# Patient Record
Sex: Female | Born: 1950 | Race: White | Hispanic: No | Marital: Married | Smoking: Never smoker
Health system: Southern US, Community
[De-identification: ages and names within clinical notes are randomized; demographics above are authoritative.]

## PROBLEM LIST (undated history)

## (undated) DIAGNOSIS — I10 Essential (primary) hypertension: Secondary | ICD-10-CM

## (undated) DIAGNOSIS — Z923 Personal history of irradiation: Secondary | ICD-10-CM

## (undated) DIAGNOSIS — C801 Malignant (primary) neoplasm, unspecified: Secondary | ICD-10-CM

## (undated) DIAGNOSIS — E039 Hypothyroidism, unspecified: Secondary | ICD-10-CM

## (undated) DIAGNOSIS — C4490 Unspecified malignant neoplasm of skin, unspecified: Secondary | ICD-10-CM

## (undated) DIAGNOSIS — K5792 Diverticulitis of intestine, part unspecified, without perforation or abscess without bleeding: Secondary | ICD-10-CM

## (undated) DIAGNOSIS — C50919 Malignant neoplasm of unspecified site of unspecified female breast: Secondary | ICD-10-CM

## (undated) DIAGNOSIS — K219 Gastro-esophageal reflux disease without esophagitis: Secondary | ICD-10-CM

## (undated) DIAGNOSIS — Z9221 Personal history of antineoplastic chemotherapy: Secondary | ICD-10-CM

## (undated) DIAGNOSIS — T7840XA Allergy, unspecified, initial encounter: Secondary | ICD-10-CM

## (undated) HISTORY — DX: Diverticulitis of intestine, part unspecified, without perforation or abscess without bleeding: K57.92

## (undated) HISTORY — DX: Allergy, unspecified, initial encounter: T78.40XA

## (undated) HISTORY — DX: Malignant neoplasm of unspecified site of unspecified female breast: C50.919

## (undated) HISTORY — DX: Unspecified malignant neoplasm of skin, unspecified: C44.90

## (undated) HISTORY — PX: COLONOSCOPY: SHX174

---

## 2013-04-07 ENCOUNTER — Other Ambulatory Visit: Payer: Self-pay | Admitting: Nurse Practitioner

## 2013-04-07 DIAGNOSIS — Z1231 Encounter for screening mammogram for malignant neoplasm of breast: Secondary | ICD-10-CM

## 2013-04-17 ENCOUNTER — Ambulatory Visit: Payer: Self-pay

## 2020-04-20 ENCOUNTER — Other Ambulatory Visit: Payer: Self-pay | Admitting: Nurse Practitioner

## 2020-04-20 DIAGNOSIS — N6320 Unspecified lump in the left breast, unspecified quadrant: Secondary | ICD-10-CM

## 2020-05-05 HISTORY — PX: BREAST BIOPSY: SHX20

## 2020-05-07 ENCOUNTER — Ambulatory Visit
Admission: RE | Admit: 2020-05-07 | Discharge: 2020-05-07 | Disposition: A | Discharge status: Home / Self Care | Payer: BC Managed Care – PPO | Source: Ambulatory Visit | Attending: Nurse Practitioner | Admitting: Nurse Practitioner

## 2020-05-07 ENCOUNTER — Other Ambulatory Visit: Payer: Self-pay

## 2020-05-07 ENCOUNTER — Other Ambulatory Visit: Payer: Self-pay | Admitting: Nurse Practitioner

## 2020-05-07 DIAGNOSIS — N6320 Unspecified lump in the left breast, unspecified quadrant: Secondary | ICD-10-CM

## 2020-05-07 DIAGNOSIS — R599 Enlarged lymph nodes, unspecified: Secondary | ICD-10-CM

## 2020-05-07 DIAGNOSIS — R921 Mammographic calcification found on diagnostic imaging of breast: Secondary | ICD-10-CM

## 2020-05-07 IMAGING — MG DIGITAL DIAGNOSTIC BILAT W/ TOMO W/ CAD
8 of 17 series · 8 of 40 positions shown · non-contrast
Comparison: [DATE] and [DATE]

ACR Breast Density Category a: The breast tissue is almost entirely
fatty.

CLINICAL DATA: Palpable mass in the LEFT breast since the end [DATE].

EXAM:
DIGITAL DIAGNOSTIC BILATERAL MAMMOGRAM WITH CAD AND TOMO
ULTRASOUND LEFT BREAST

[L ML]
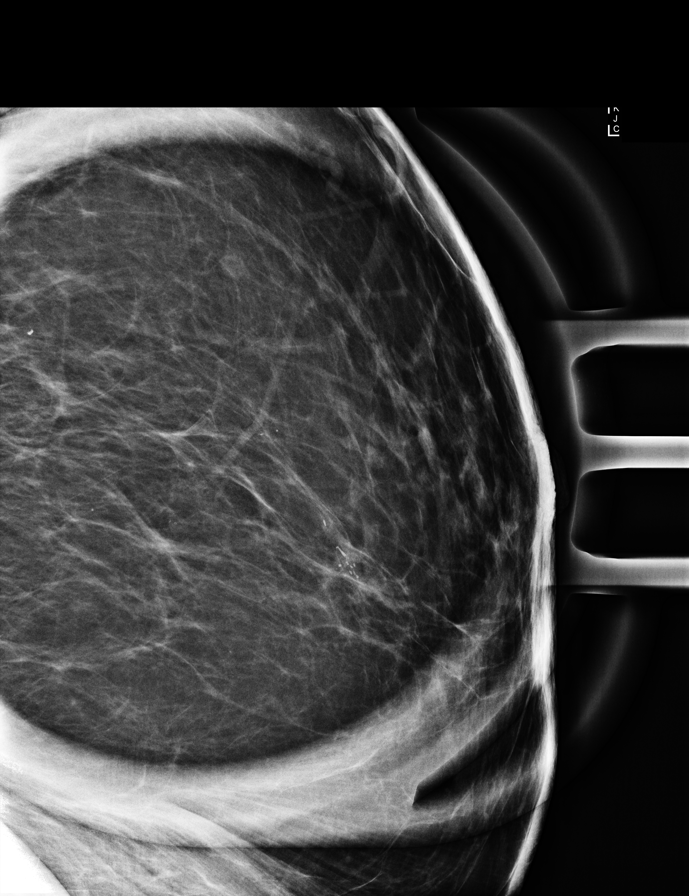

[R ML]
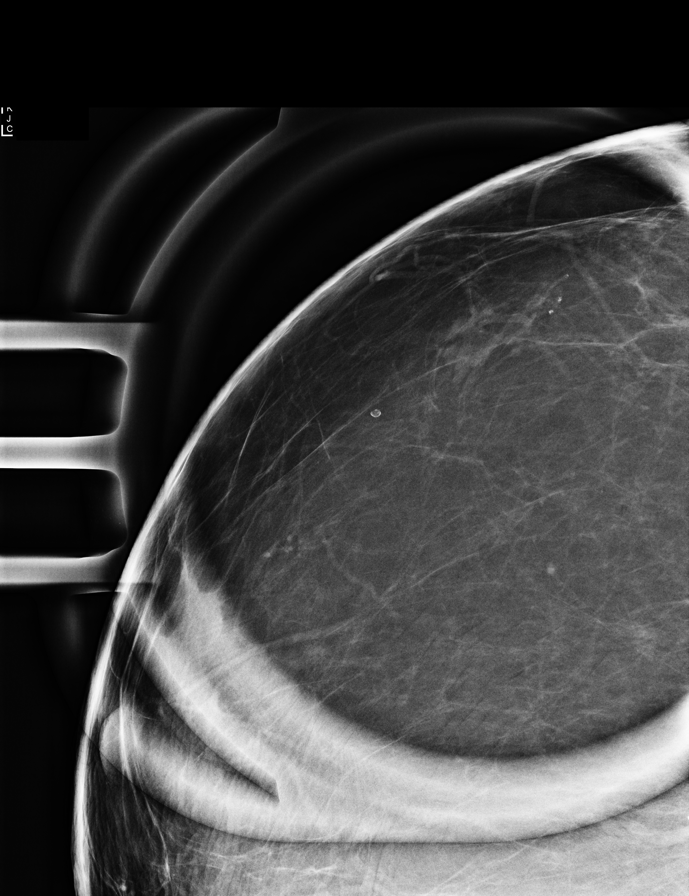

[L CC]
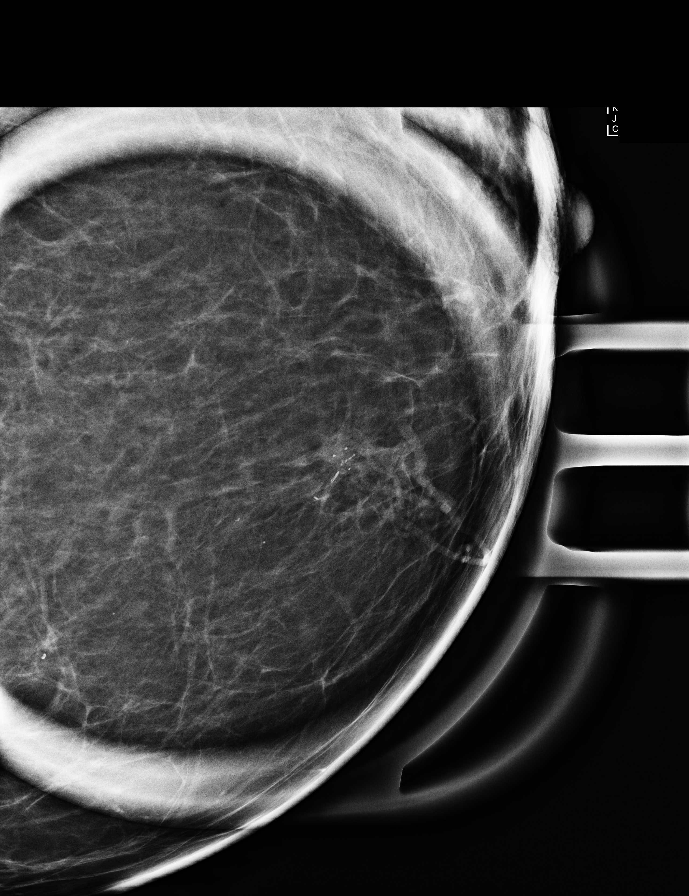

[R CC]
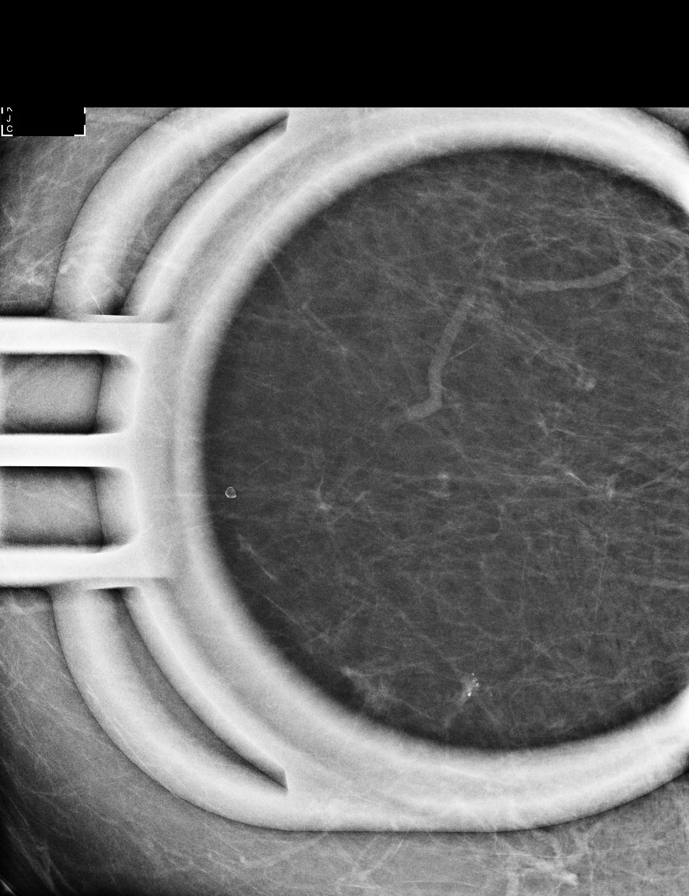

[L CC synth-2D]
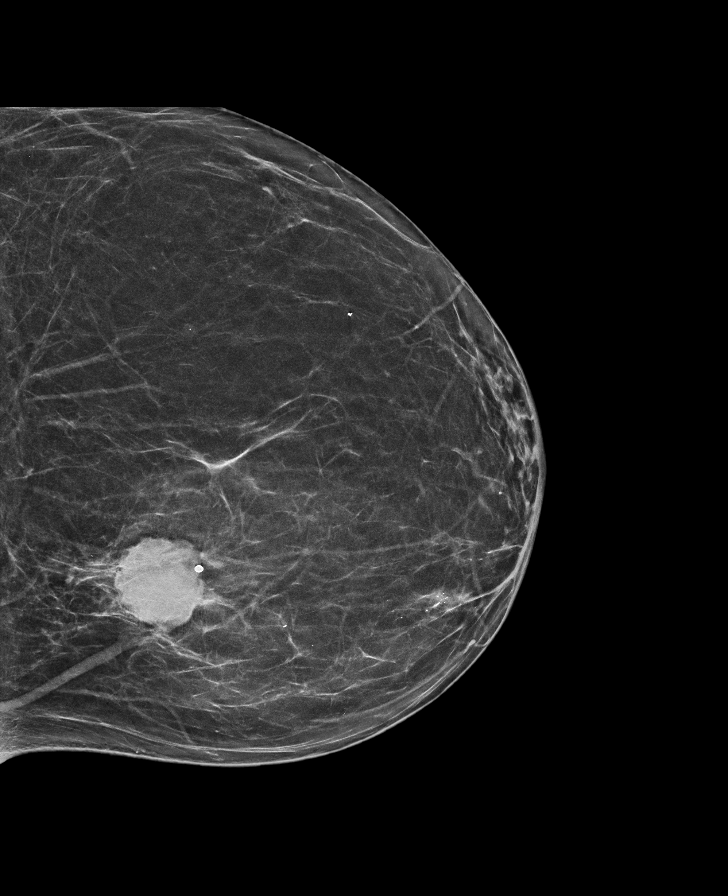

[R CC synth-2D]
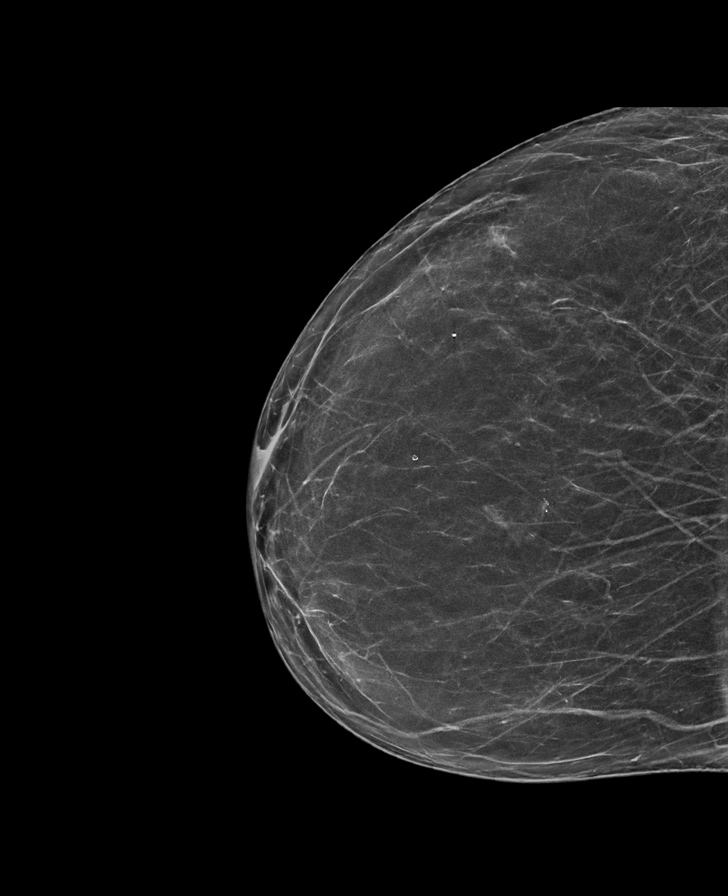

[L MLO synth-2D]
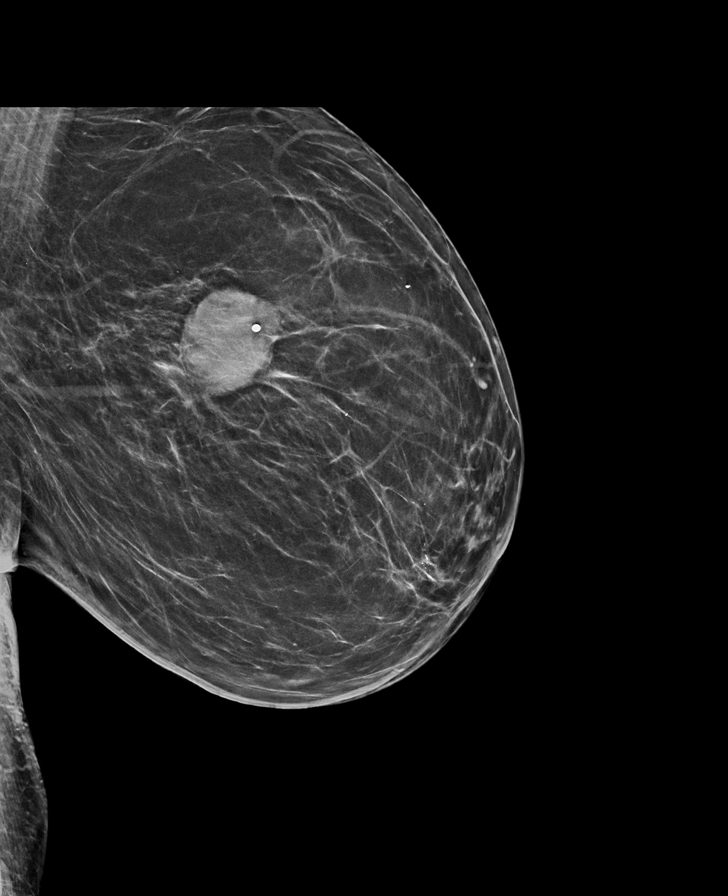

[R MLO synth-2D]
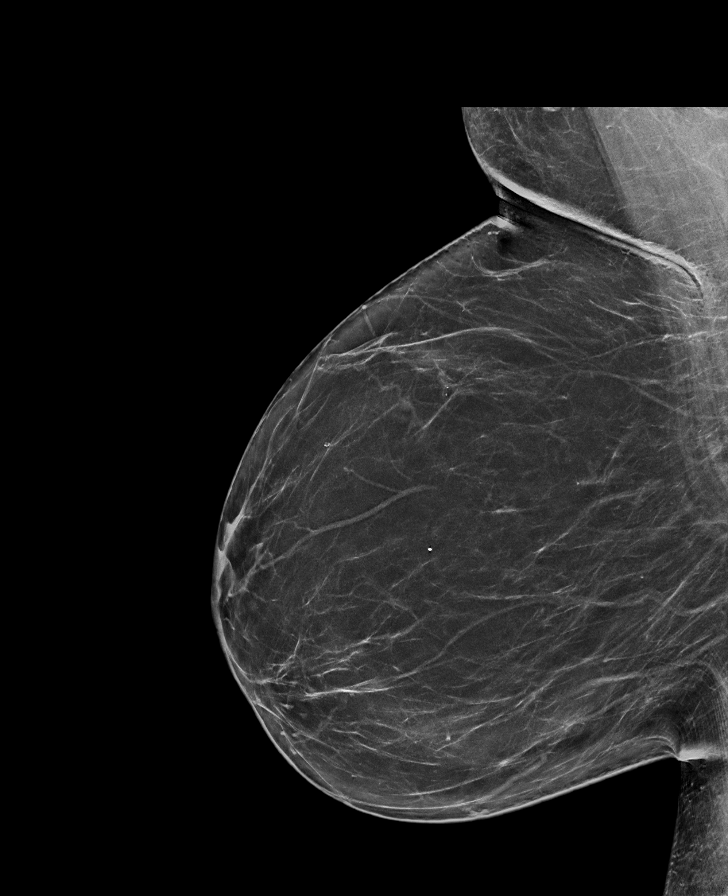

[8 of 40 positions shown; findings below may reference images not displayed]

FINDINGS: RIGHT BREAST:

Mammogram: Within the UPPER central portion of the RIGHT breast
there is a group of calcifications further evaluated with magnified
views. These views demonstrate a 4 millimeter group of mildly
heterogeneous calcifications associated with parenchymal asymmetry.
Otherwise RIGHT breast is negative. Mammographic images were
processed with CAD.

LEFT BREAST:

Mammogram: There is an oval mass with irregular margins in the UPPER
INNER QUADRANT of the LEFT breast. In the LOWER INNER QUADRANT of
the LEFT breast, anterior depth, a group of calcifications is
further evaluated with magnified views. These views demonstrate a
group of linear and punctate calcifications spanning 0.5 x
centimeters. Mammographic images were processed with CAD.

Physical Exam: I palpate a discrete mass in the UPPER INNER QUADRANT
of the LEFT breast.

Ultrasound: Targeted ultrasound is performed, showing an oval mass
with irregular margins in in the 10:30 o'clock location of the LEFT
breast 10 centimeters from the nipple. Mass is 2.7 x 2.6 x
centimeters. Internal blood flow is present on Doppler evaluation.

Evaluation of the LEFT axilla shows a single lymph node with mildly
thickened cortex of 4 millimeters. Other lymph nodes have normal
morphology.
IMPRESSION: 1. Indeterminate 4 millimeter group of calcifications in the UPPER
central RIGHT breast.
2. Suspicious mass in the 10:30 o'clock location of the LEFT breast.
3. Suspicious calcifications in the anterior LOWER INNER QUADRANT of
the LEFT breast.
4. Indeterminate single LEFT axillary lymph node.

RECOMMENDATION:
1. Recommend stereotactic guided core biopsy of calcifications in
the UPPER central RIGHT breast in the anterior LOWER INNER QUADRANT
of the LEFT breast.
2. Recommend ultrasound-guided core biopsy of mass in the 10:30
o'clock location of the LEFT breast and mildly prominent LEFT
axillary lymph node.
3. Recommend biopsies be scheduled for 2 separate days, scheduled on
[DATE] and [DATE].

I have discussed the findings and recommendations with the patient.
If applicable, a reminder letter will be sent to the patient
regarding the next appointment.

BI-RADS CATEGORY  5: Highly suggestive of malignancy.

## 2020-05-07 IMAGING — US US BREAST*L* LIMITED INC AXILLA
1 series · 13 of 14 positions shown · non-contrast
Comparison: [DATE] and [DATE]

ACR Breast Density Category a: The breast tissue is almost entirely
fatty.

CLINICAL DATA: Palpable mass in the LEFT breast since the end [DATE].

EXAM:
DIGITAL DIAGNOSTIC BILATERAL MAMMOGRAM WITH CAD AND TOMO
ULTRASOUND LEFT BREAST

[Series 1: us breast*left* limited inc axilla · 0.08mm/px · 13 of 14 slices shown]
[im 1/14]
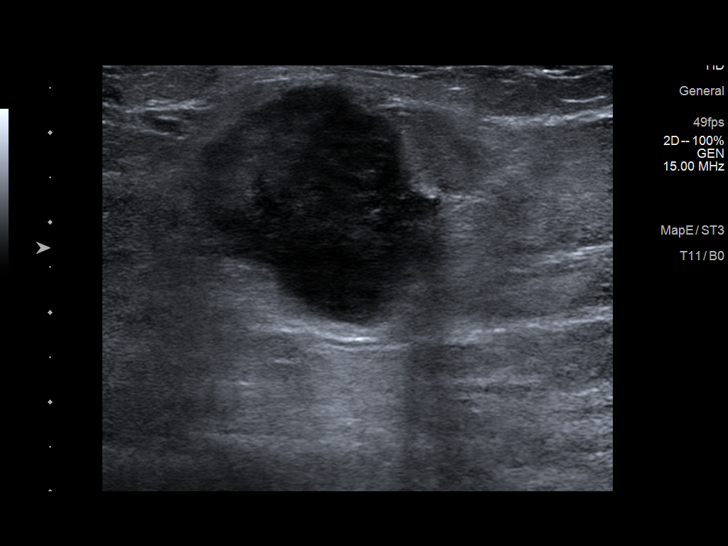
[im 2/14]
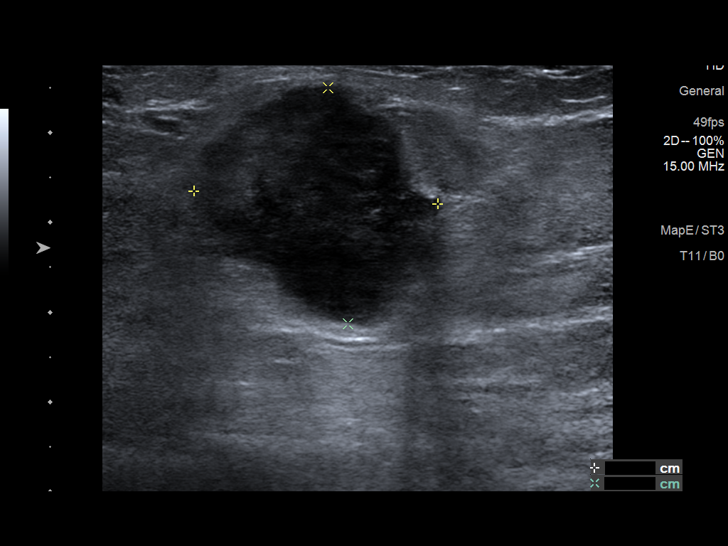
[im 3/14]
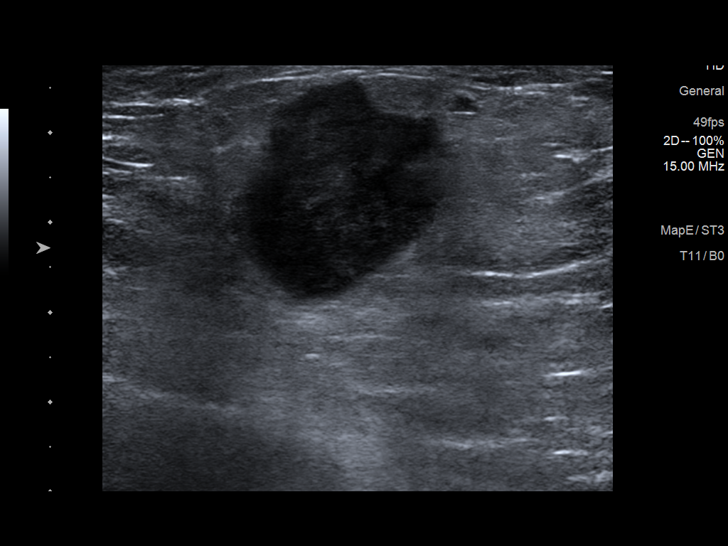
[im 4/14]
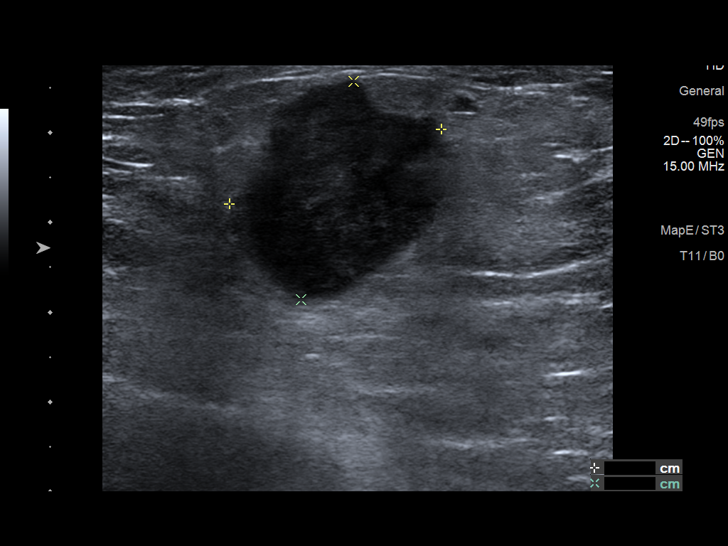
[im 5/14]
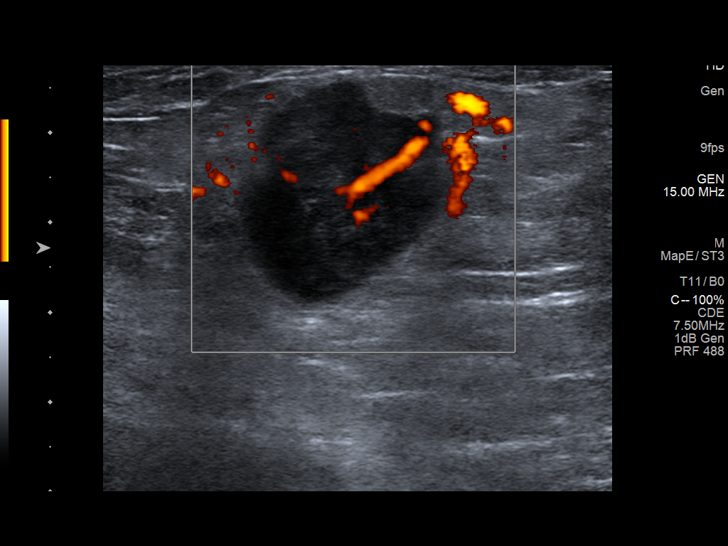
[im 6/14]
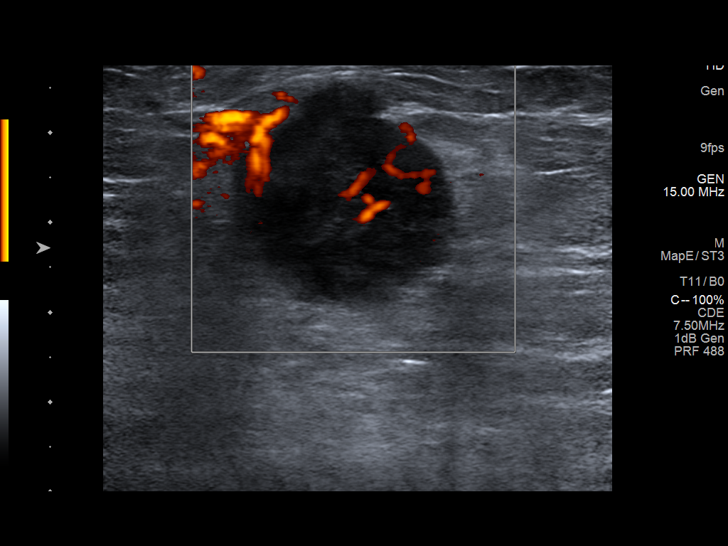
[im 8/14]
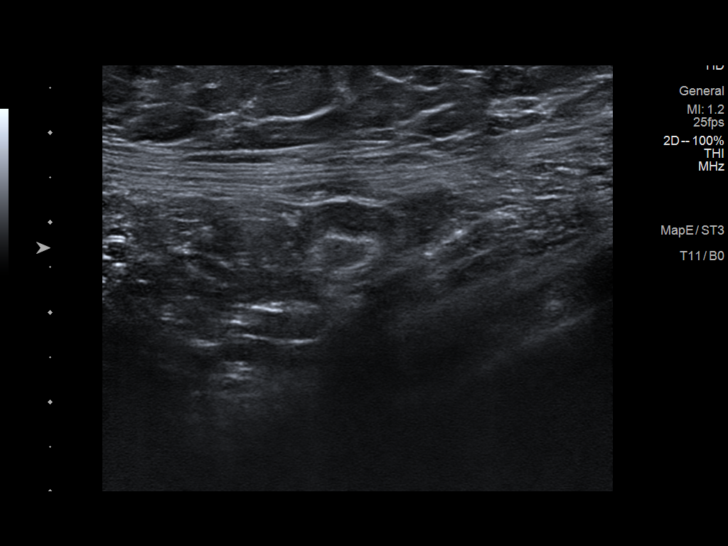
[im 9/14]
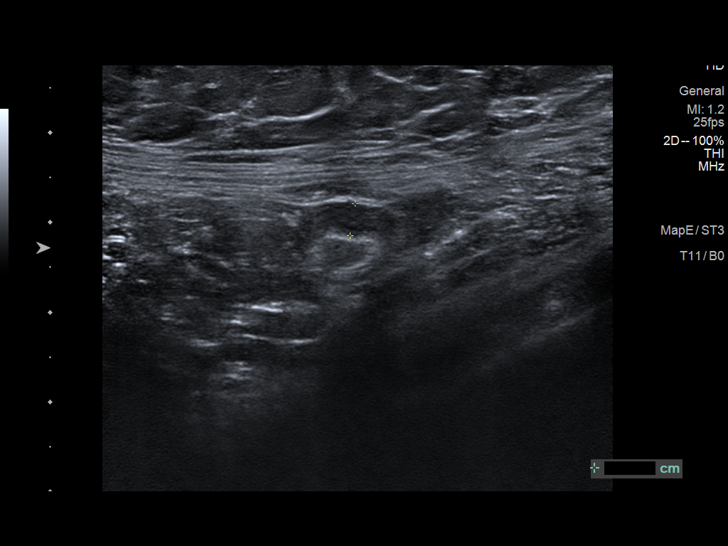
[im 10/14]
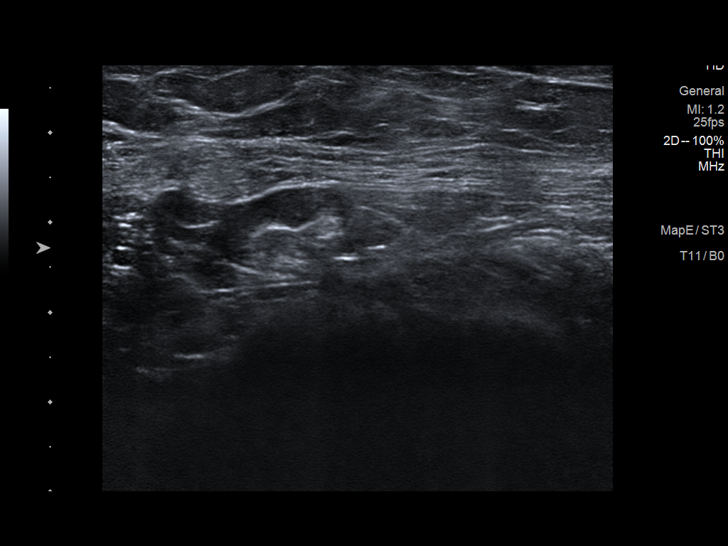
[im 11/14]
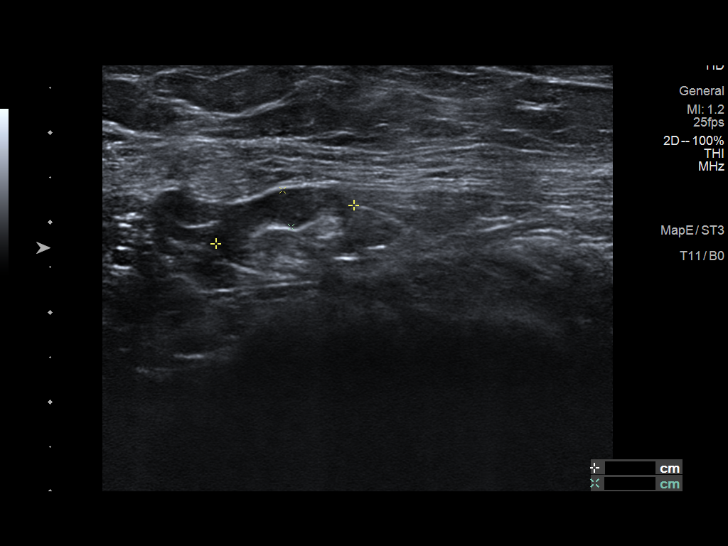
[im 12/14]
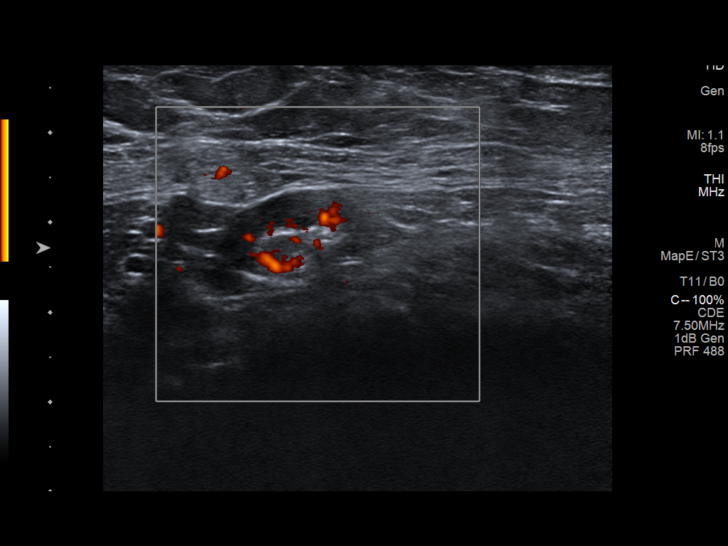
[im 13/14]
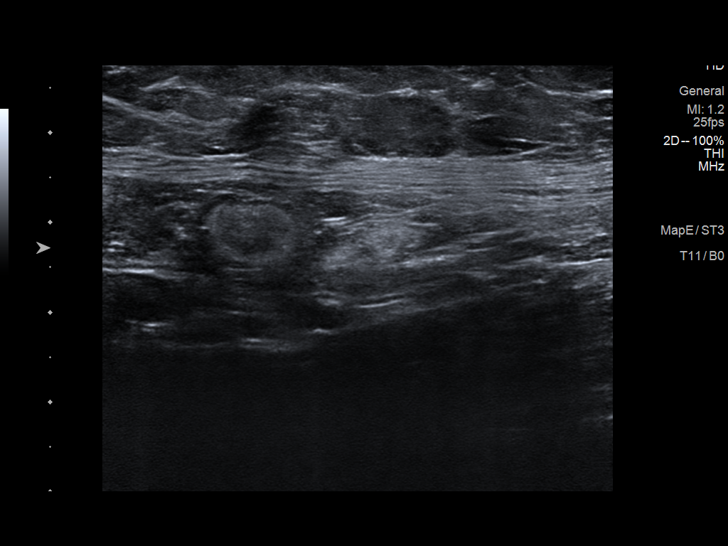
[im 14/14]
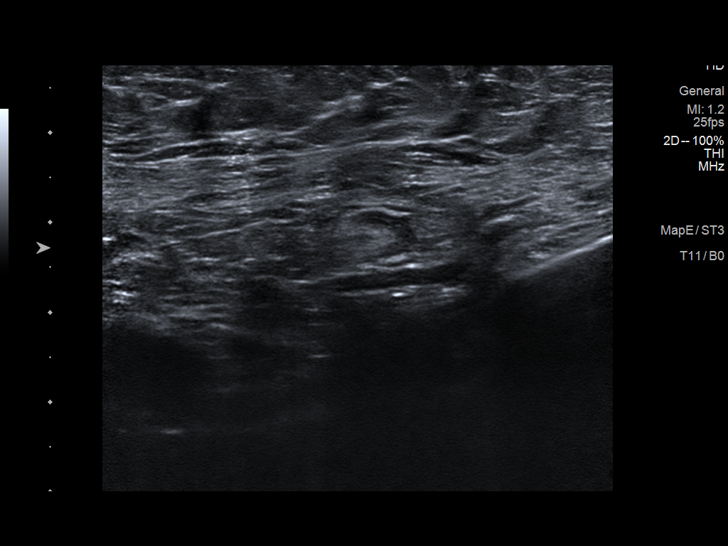

[13 of 14 positions shown; findings below may reference images not displayed]

FINDINGS: RIGHT BREAST:

Mammogram: Within the UPPER central portion of the RIGHT breast
there is a group of calcifications further evaluated with magnified
views. These views demonstrate a 4 millimeter group of mildly
heterogeneous calcifications associated with parenchymal asymmetry.
Otherwise RIGHT breast is negative. Mammographic images were
processed with CAD.

LEFT BREAST:

Mammogram: There is an oval mass with irregular margins in the UPPER
INNER QUADRANT of the LEFT breast. In the LOWER INNER QUADRANT of
the LEFT breast, anterior depth, a group of calcifications is
further evaluated with magnified views. These views demonstrate a
group of linear and punctate calcifications spanning 0.5 x
centimeters. Mammographic images were processed with CAD.

Physical Exam: I palpate a discrete mass in the UPPER INNER QUADRANT
of the LEFT breast.

Ultrasound: Targeted ultrasound is performed, showing an oval mass
with irregular margins in in the 10:30 o'clock location of the LEFT
breast 10 centimeters from the nipple. Mass is 2.7 x 2.6 x
centimeters. Internal blood flow is present on Doppler evaluation.

Evaluation of the LEFT axilla shows a single lymph node with mildly
thickened cortex of 4 millimeters. Other lymph nodes have normal
morphology.
IMPRESSION: 1. Indeterminate 4 millimeter group of calcifications in the UPPER
central RIGHT breast.
2. Suspicious mass in the 10:30 o'clock location of the LEFT breast.
3. Suspicious calcifications in the anterior LOWER INNER QUADRANT of
the LEFT breast.
4. Indeterminate single LEFT axillary lymph node.

RECOMMENDATION:
1. Recommend stereotactic guided core biopsy of calcifications in
the UPPER central RIGHT breast in the anterior LOWER INNER QUADRANT
of the LEFT breast.
2. Recommend ultrasound-guided core biopsy of mass in the 10:30
o'clock location of the LEFT breast and mildly prominent LEFT
axillary lymph node.
3. Recommend biopsies be scheduled for 2 separate days, scheduled on
[DATE] and [DATE].

I have discussed the findings and recommendations with the patient.
If applicable, a reminder letter will be sent to the patient
regarding the next appointment.

BI-RADS CATEGORY  5: Highly suggestive of malignancy.

## 2020-05-14 ENCOUNTER — Ambulatory Visit
Admission: RE | Admit: 2020-05-14 | Discharge: 2020-05-14 | Disposition: A | Discharge status: Home / Self Care | Payer: BC Managed Care – PPO | Source: Ambulatory Visit | Attending: Nurse Practitioner | Admitting: Nurse Practitioner

## 2020-05-14 ENCOUNTER — Other Ambulatory Visit: Payer: Self-pay

## 2020-05-14 DIAGNOSIS — R921 Mammographic calcification found on diagnostic imaging of breast: Secondary | ICD-10-CM

## 2020-05-14 HISTORY — PX: BREAST BIOPSY: SHX20

## 2020-05-14 IMAGING — MG MM BREAST BX W LOC DEV 1ST LESION IMAGE BX SPEC STEREO GUIDE*L*
7 of 9 series · 7 of 17 positions shown · non-contrast
Comparison: Previous exams.
COMPARISON: Previous exams.

Addendum:
CLINICAL DATA: 68-year-old who presented with a palpable 2.7 cm
mass involving the UPPER INNER QUADRANT of the LEFT breast, shown on
diagnostic workup to have an indeterminate 0.4 cm group of
calcifications associated with a focal asymmetry in the UPPER RIGHT
breast at MIDDLE to POSTERIOR depth, and a suspicious 1.0 cm group
of calcifications in the INNER LEFT breast at ANTERIOR depth.
Stereotactic biopsy the calcifications in both breasts is performed.

The patient is scheduled for ultrasound-guided core needle biopsy of
the palpable LEFT breast mass and a solitary indeterminate LEFT
axillary lymph node on [REDACTED], [DATE].
EXAM:
# 1) LEFT BREAST STEREOTACTIC CORE NEEDLE BIOPSY
# 2) RIGHT BREAST STEREOTACTIC CORE NEEDLE BIOPSY

[L (1 of 4)]
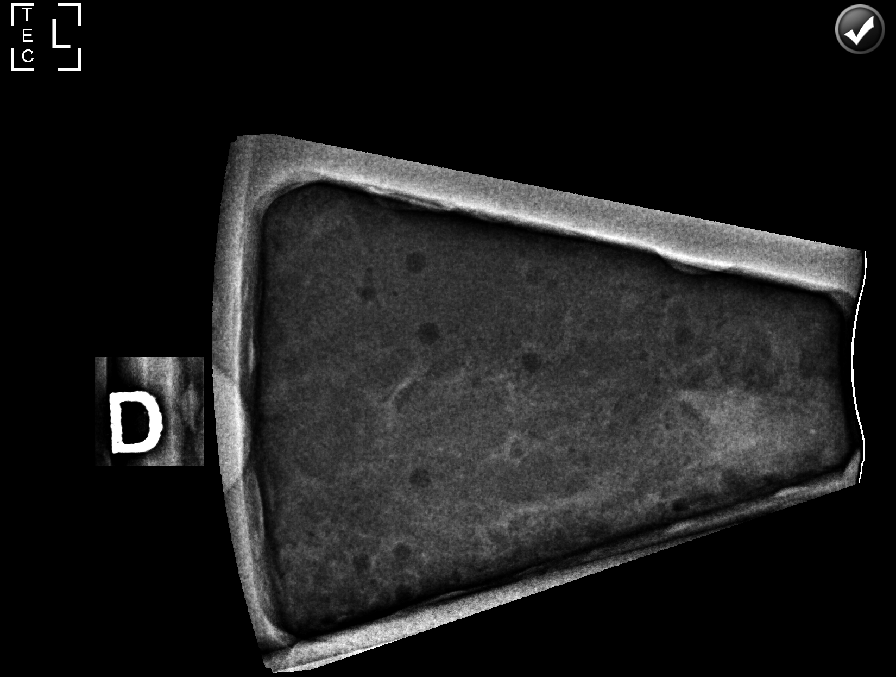

[L (2 of 4)]
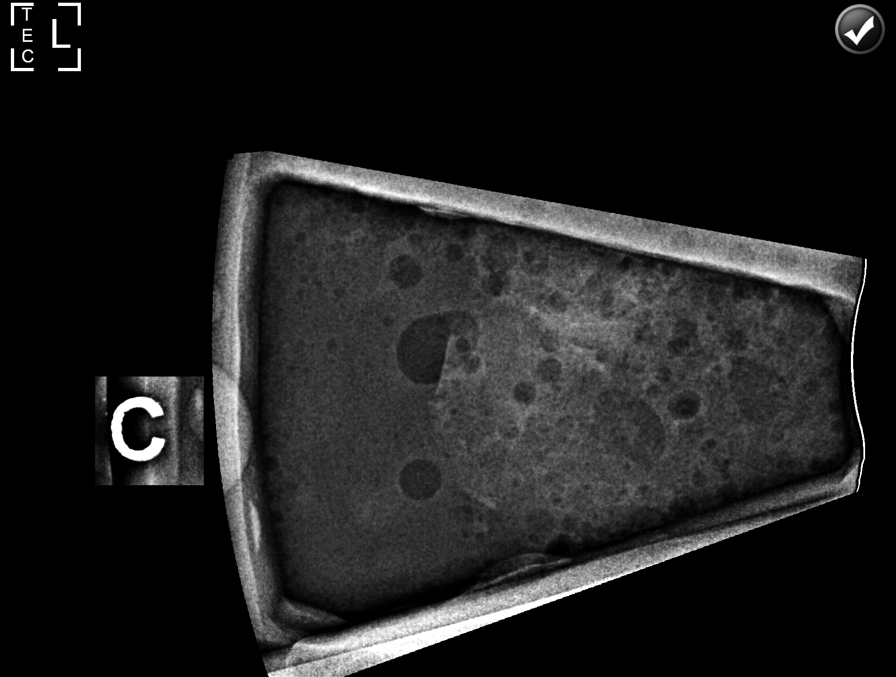

[L (3 of 4)]
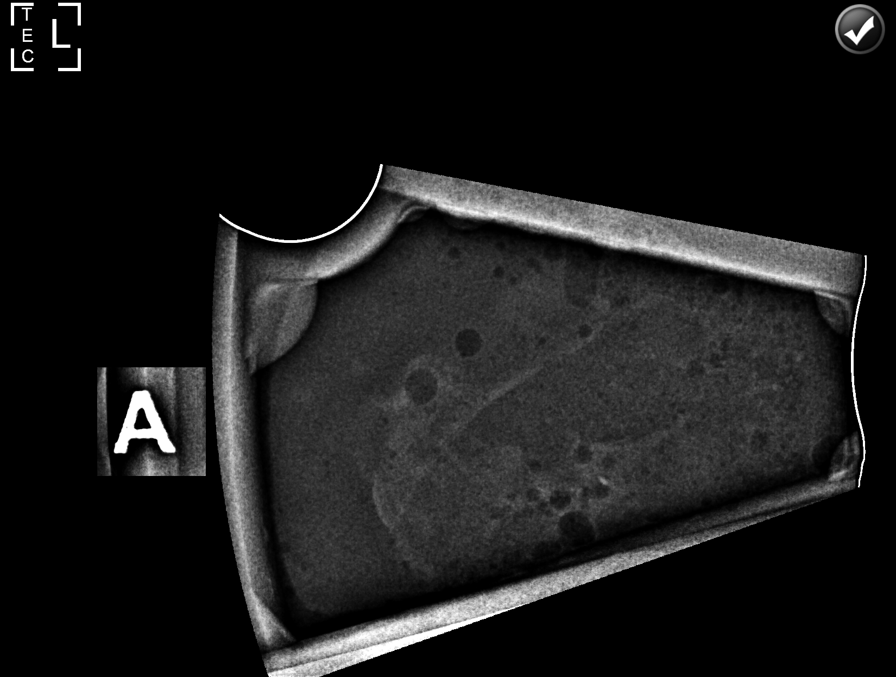

[L (4 of 4)]
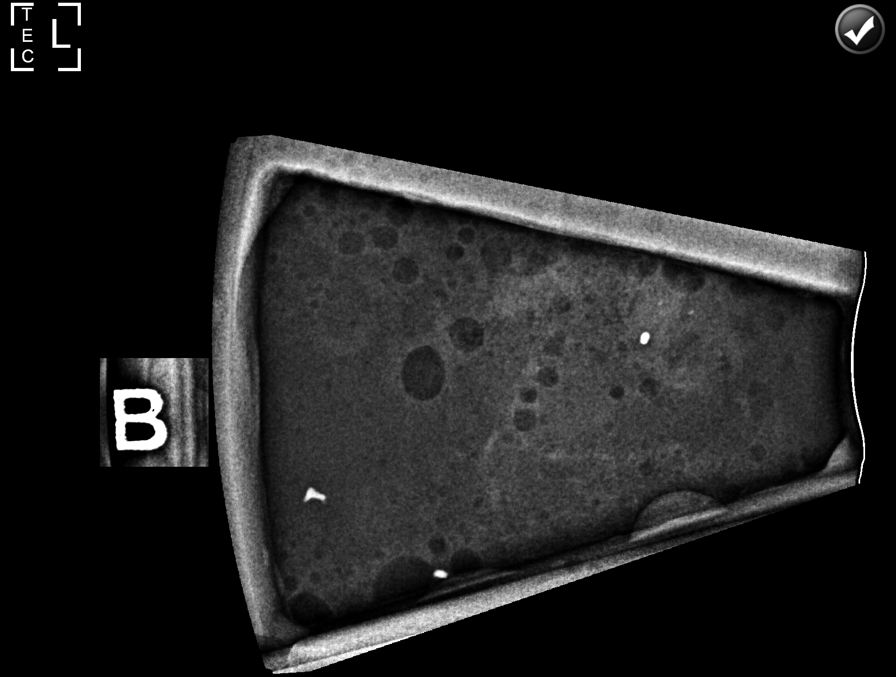

[L CC (1 of 3)]
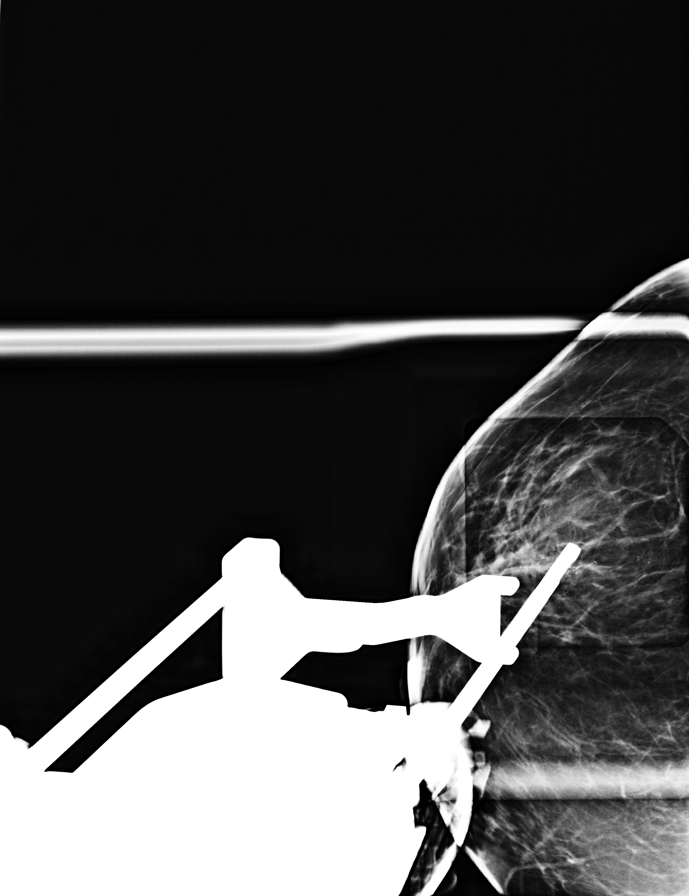

[L CC (2 of 3)]
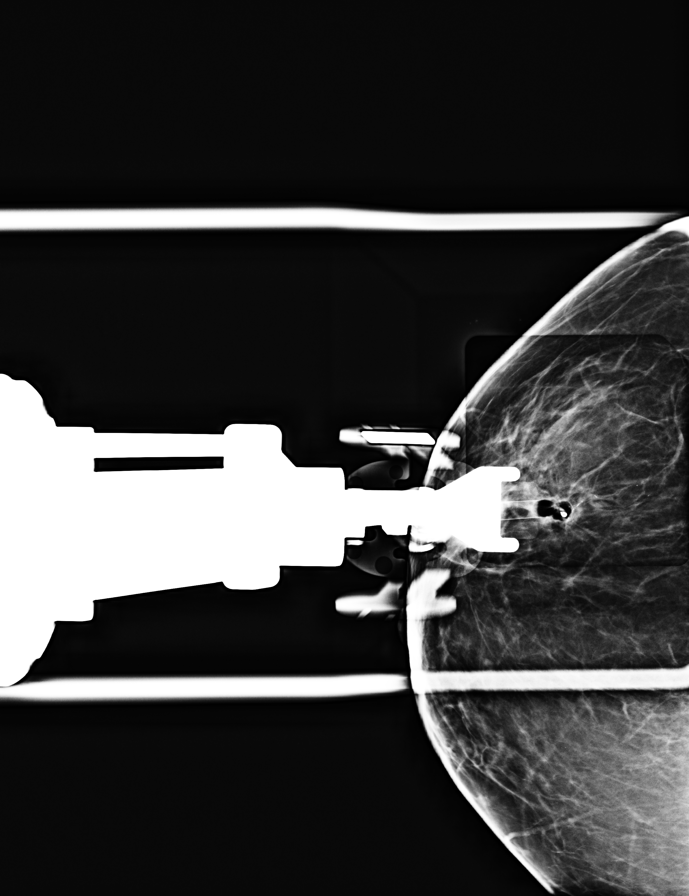

[L CC (3 of 3)]
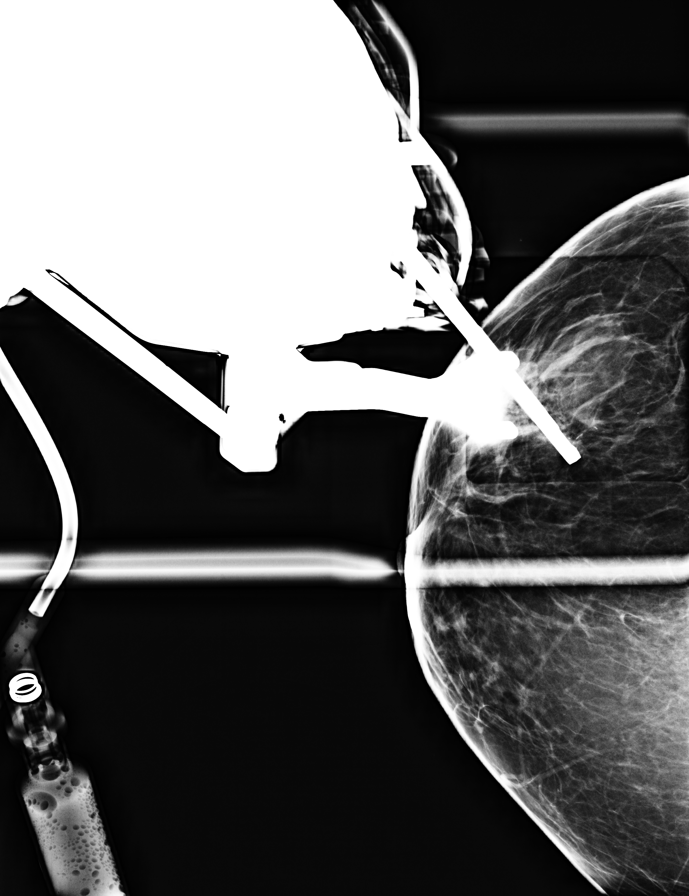

[7 of 17 positions shown; findings below may reference images not displayed]



# 1) LEFT breast, lesion quadrant: INNER breast, slight UPPER INNER
QUADRANT.

Using sterile technique with chlorhexidine as skin antisepsis, 1%
lidocaine and 1% lidocaine with epinephrine as local anesthetic,
under stereotactic guidance, a 9 gauge Brevera vacuum assisted
device was used to perform core needle biopsy of calcifications
involving the INNER LEFT breast using a superior approach. Specimen
radiograph was performed showing calcifications in at least 3 of the
core samples. Specimens with calcifications are identified for
pathology. At the conclusion of the procedure, a coil shaped tissue
marker clip was deployed into the biopsy cavity.

# 2) RIGHT breast, lesion quadrant: UPPER breast, slight UPPER INNER
QUADRANT.

Using sterile technique with chlorhexidine as skin antisepsis, 1%
lidocaine and 1% lidocaine with epinephrine as local anesthetic,
under stereotactic guidance, a 9 gauge Brevera vacuum assisted
device was used to perform core biopsy of calcifications involving
the UPPER RIGHT breast using a superior approach. Specimen
radiograph was performed showing essentially all of the
calcifications in the initial core sample. Specimens with
calcifications are identified for pathology. At the conclusion of
the procedure, a coil shaped tissue marker clip was deployed into
the biopsy cavity.

Follow-up 2-view mammogram was performed and dictated separately.
IMPRESSION: 1. Stereotactic guided biopsy of calcifications involving the INNER
LEFT breast at ANTERIOR depth.
2. Stereotactic guided biopsy of calcifications involving the UPPER
RIGHT breast at MIDDLE depth.
3. No apparent complications.

ADDENDUM:
Pathology revealed HIGH GRADE DUCTAL CARCINOMA IN SITU WITH
CALCIFICATIONS AND NECROSIS of the LEFT breast, upper inner quadrant
anterior depth. This was found to be concordant byDr. OXENDINE
OXENDINE.

Pathology revealed FIBROADENOMATOID NODULE WITH CALCIFICATIONS of
the RIGHT breast, upper, mid/posterior depth. This was found to be
concordant by Dr. OXENDINE.

Pathology results were discussed with the patient in person by Dr.
OXENDINE. The patient reported doing well after the biopsies
with tenderness at the sites. Post biopsy instructions and care were
reviewed and questions were answered. The patient was encouraged to
call The [REDACTED] for any additional
concerns.

The patient is scheduled for additional LEFT breast biopsy and LEFT
axillary node biopsy on [DATE]. Further recommendations will
be guided by these results.

Due to High Grade histology, Breast MRI is recommended to determine
extent of disease.

Pathology results reported by OXENDINE RN on [DATE].



# 1) LEFT breast, lesion quadrant: INNER breast, slight UPPER INNER
QUADRANT.

Using sterile technique with chlorhexidine as skin antisepsis, 1%
lidocaine and 1% lidocaine with epinephrine as local anesthetic,
under stereotactic guidance, a 9 gauge Brevera vacuum assisted
device was used to perform core needle biopsy of calcifications
involving the INNER LEFT breast using a superior approach. Specimen
radiograph was performed showing calcifications in at least 3 of the
core samples. Specimens with calcifications are identified for
pathology. At the conclusion of the procedure, a coil shaped tissue
marker clip was deployed into the biopsy cavity.

# 2) RIGHT breast, lesion quadrant: UPPER breast, slight UPPER INNER
QUADRANT.

Using sterile technique with chlorhexidine as skin antisepsis, 1%
lidocaine and 1% lidocaine with epinephrine as local anesthetic,
under stereotactic guidance, a 9 gauge Brevera vacuum assisted
device was used to perform core biopsy of calcifications involving
the UPPER RIGHT breast using a superior approach. Specimen
radiograph was performed showing essentially all of the
calcifications in the initial core sample. Specimens with
calcifications are identified for pathology. At the conclusion of
the procedure, a coil shaped tissue marker clip was deployed into
the biopsy cavity.

Follow-up 2-view mammogram was performed and dictated separately.
IMPRESSION: 1. Stereotactic guided biopsy of calcifications involving the INNER
LEFT breast at ANTERIOR depth.
2. Stereotactic guided biopsy of calcifications involving the UPPER
RIGHT breast at MIDDLE depth.
3. No apparent complications.

## 2020-05-14 IMAGING — MG MM BREAST BX W/ LOC DEV 1ST LESION IMAGE BX SPEC STEREO GUIDE*R*
7 series · 7 of 15 positions shown · non-contrast
Comparison: Previous exams.
COMPARISON: Previous exams.

Addendum:
CLINICAL DATA: 68-year-old who presented with a palpable 2.7 cm
mass involving the UPPER INNER QUADRANT of the LEFT breast, shown on
diagnostic workup to have an indeterminate 0.4 cm group of
calcifications associated with a focal asymmetry in the UPPER RIGHT
breast at MIDDLE to POSTERIOR depth, and a suspicious 1.0 cm group
of calcifications in the INNER LEFT breast at ANTERIOR depth.
Stereotactic biopsy the calcifications in both breasts is performed.

The patient is scheduled for ultrasound-guided core needle biopsy of
the palpable LEFT breast mass and a solitary indeterminate LEFT
axillary lymph node on [REDACTED], [DATE].
EXAM:
# 1) LEFT BREAST STEREOTACTIC CORE NEEDLE BIOPSY
# 2) RIGHT BREAST STEREOTACTIC CORE NEEDLE BIOPSY

[R (1 of 2)]
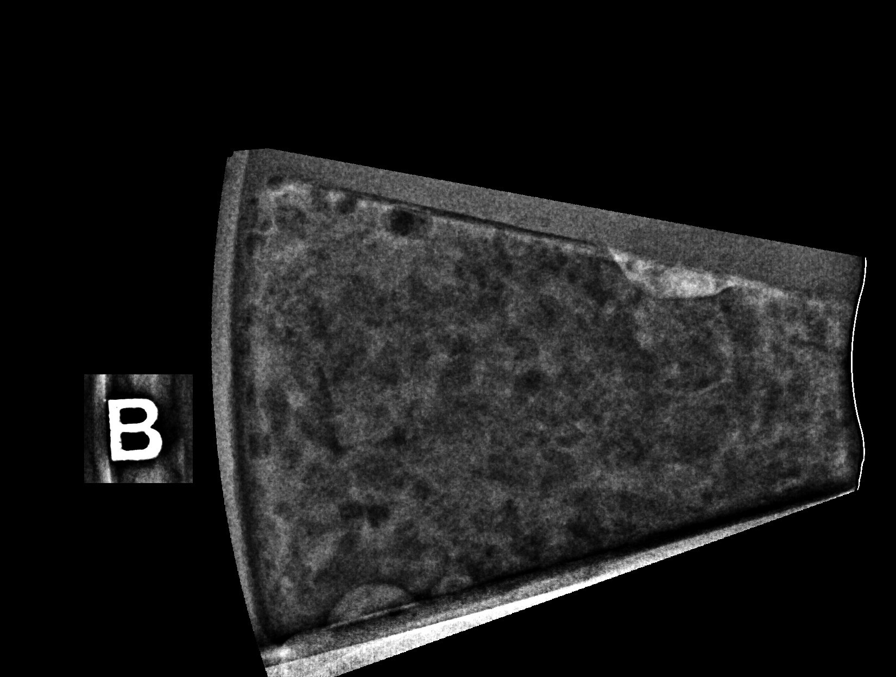

[R (2 of 2)]
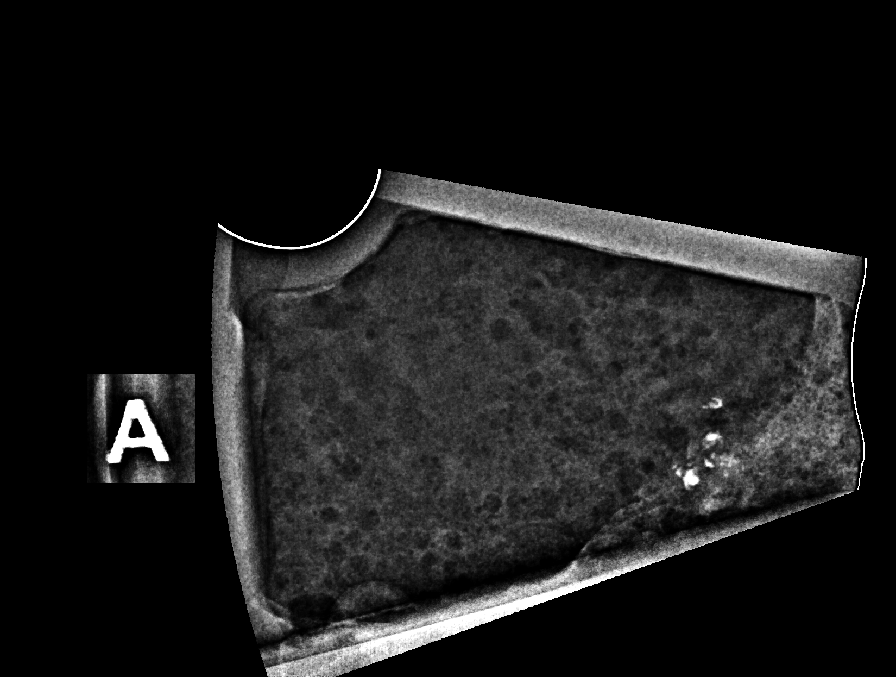

[R CC (1 of 3)]
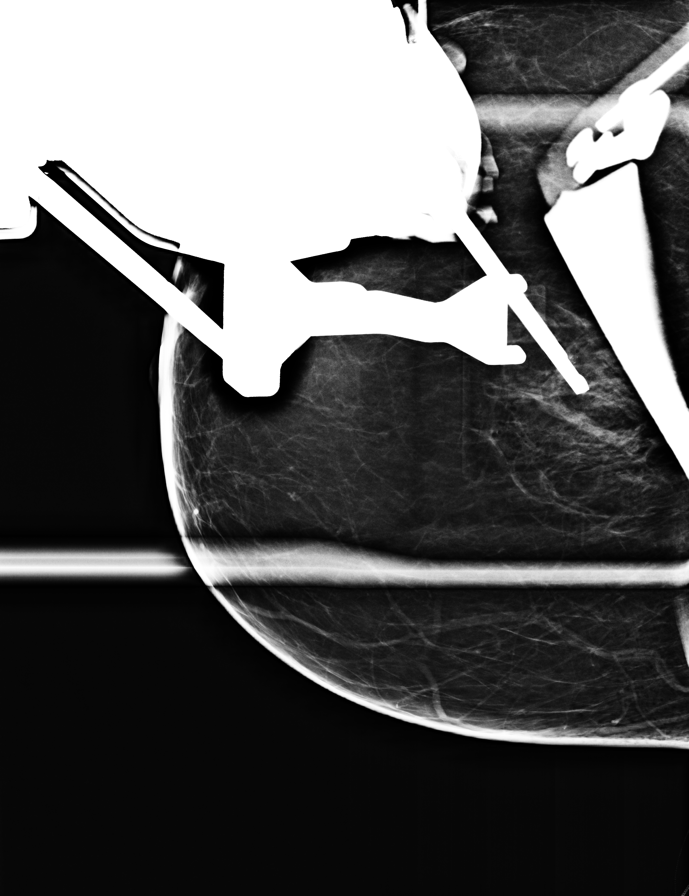

[R CC (2 of 3)]
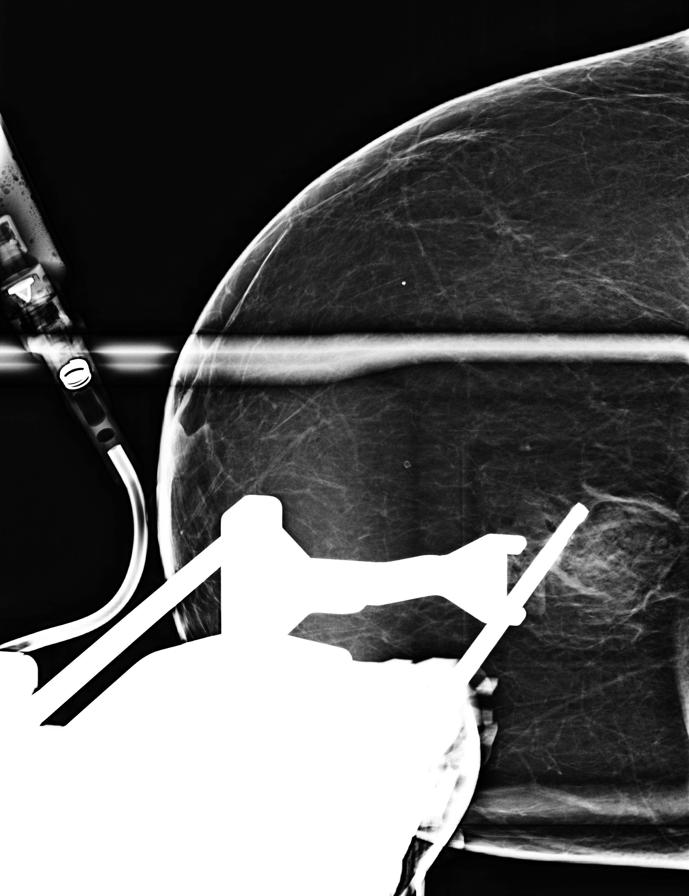

[R CC (3 of 3)]
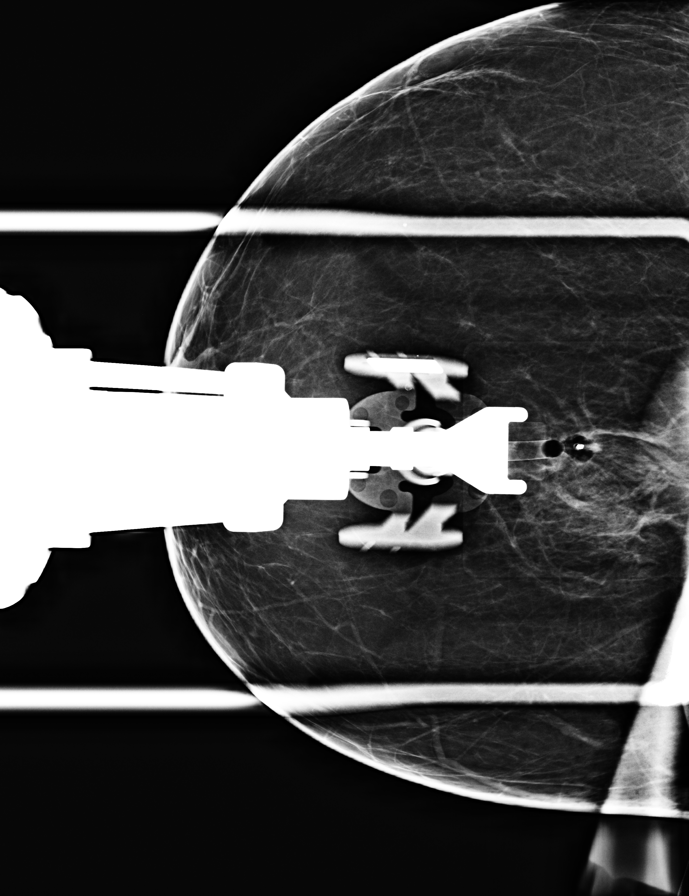

[R CC tomo (1 of 2) · tomo slice 31/60.0]
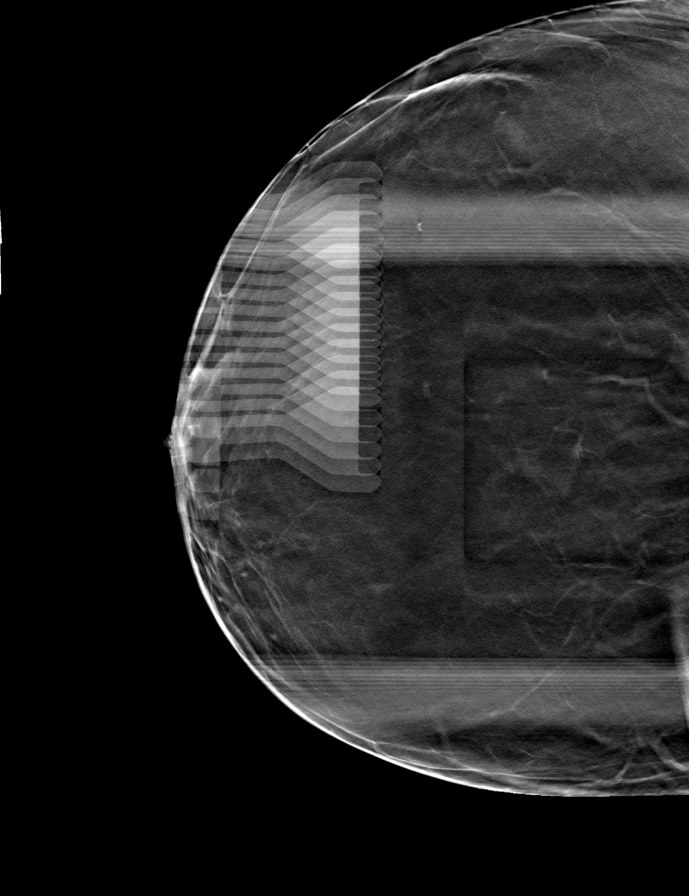

[R CC tomo (2 of 2) · tomo slice 31/60.0]
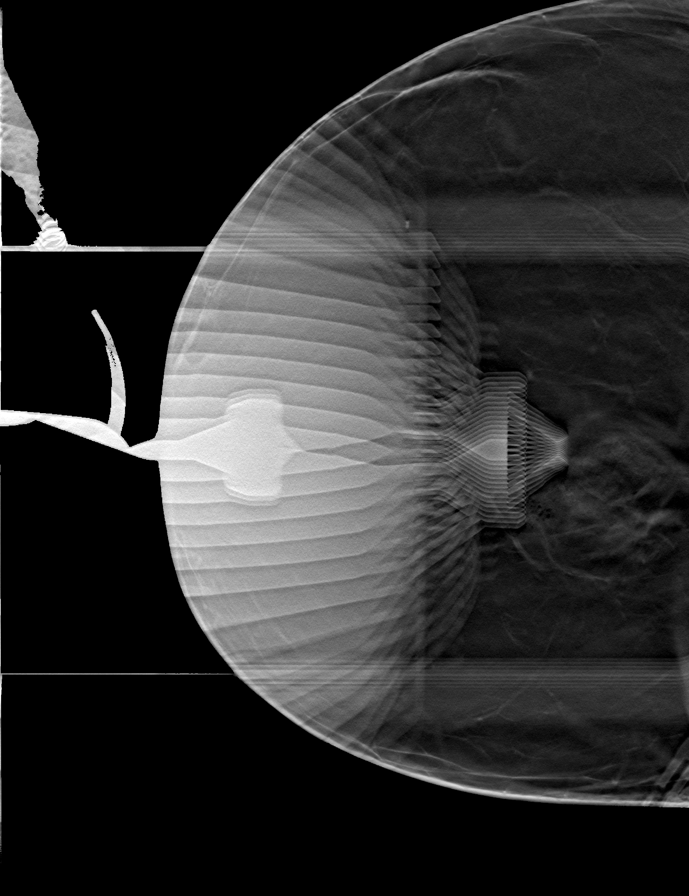

[7 of 15 positions shown; findings below may reference images not displayed]



# 1) LEFT breast, lesion quadrant: INNER breast, slight UPPER INNER
QUADRANT.

Using sterile technique with chlorhexidine as skin antisepsis, 1%
lidocaine and 1% lidocaine with epinephrine as local anesthetic,
under stereotactic guidance, a 9 gauge Brevera vacuum assisted
device was used to perform core needle biopsy of calcifications
involving the INNER LEFT breast using a superior approach. Specimen
radiograph was performed showing calcifications in at least 3 of the
core samples. Specimens with calcifications are identified for
pathology. At the conclusion of the procedure, a coil shaped tissue
marker clip was deployed into the biopsy cavity.

# 2) RIGHT breast, lesion quadrant: UPPER breast, slight UPPER INNER
QUADRANT.

Using sterile technique with chlorhexidine as skin antisepsis, 1%
lidocaine and 1% lidocaine with epinephrine as local anesthetic,
under stereotactic guidance, a 9 gauge Brevera vacuum assisted
device was used to perform core biopsy of calcifications involving
the UPPER RIGHT breast using a superior approach. Specimen
radiograph was performed showing essentially all of the
calcifications in the initial core sample. Specimens with
calcifications are identified for pathology. At the conclusion of
the procedure, a coil shaped tissue marker clip was deployed into
the biopsy cavity.

Follow-up 2-view mammogram was performed and dictated separately.
IMPRESSION: 1. Stereotactic guided biopsy of calcifications involving the INNER
LEFT breast at ANTERIOR depth.
2. Stereotactic guided biopsy of calcifications involving the UPPER
RIGHT breast at MIDDLE depth.
3. No apparent complications.

ADDENDUM:
Pathology revealed HIGH GRADE DUCTAL CARCINOMA IN SITU WITH
CALCIFICATIONS AND NECROSIS of the LEFT breast, upper inner quadrant
anterior depth. This was found to be concordant byDr. OXENDINE
OXENDINE.

Pathology revealed FIBROADENOMATOID NODULE WITH CALCIFICATIONS of
the RIGHT breast, upper, mid/posterior depth. This was found to be
concordant by Dr. OXENDINE.

Pathology results were discussed with the patient in person by Dr.
OXENDINE. The patient reported doing well after the biopsies
with tenderness at the sites. Post biopsy instructions and care were
reviewed and questions were answered. The patient was encouraged to
call The [REDACTED] for any additional
concerns.

The patient is scheduled for additional LEFT breast biopsy and LEFT
axillary node biopsy on [DATE]. Further recommendations will
be guided by these results.

Due to High Grade histology, Breast MRI is recommended to determine
extent of disease.

Pathology results reported by OXENDINE RN on [DATE].



# 1) LEFT breast, lesion quadrant: INNER breast, slight UPPER INNER
QUADRANT.

Using sterile technique with chlorhexidine as skin antisepsis, 1%
lidocaine and 1% lidocaine with epinephrine as local anesthetic,
under stereotactic guidance, a 9 gauge Brevera vacuum assisted
device was used to perform core needle biopsy of calcifications
involving the INNER LEFT breast using a superior approach. Specimen
radiograph was performed showing calcifications in at least 3 of the
core samples. Specimens with calcifications are identified for
pathology. At the conclusion of the procedure, a coil shaped tissue
marker clip was deployed into the biopsy cavity.

# 2) RIGHT breast, lesion quadrant: UPPER breast, slight UPPER INNER
QUADRANT.

Using sterile technique with chlorhexidine as skin antisepsis, 1%
lidocaine and 1% lidocaine with epinephrine as local anesthetic,
under stereotactic guidance, a 9 gauge Brevera vacuum assisted
device was used to perform core biopsy of calcifications involving
the UPPER RIGHT breast using a superior approach. Specimen
radiograph was performed showing essentially all of the
calcifications in the initial core sample. Specimens with
calcifications are identified for pathology. At the conclusion of
the procedure, a coil shaped tissue marker clip was deployed into
the biopsy cavity.

Follow-up 2-view mammogram was performed and dictated separately.
IMPRESSION: 1. Stereotactic guided biopsy of calcifications involving the INNER
LEFT breast at ANTERIOR depth.
2. Stereotactic guided biopsy of calcifications involving the UPPER
RIGHT breast at MIDDLE depth.
3. No apparent complications.

## 2020-05-14 IMAGING — MG MM BREAST LOCALIZATION CLIP
4 series · 4 of 12 positions shown · non-contrast
Comparison: Previous exam(s).

CLINICAL DATA: Confirmation of clip placement after stereotactic
tomosynthesis core needle biopsy of in indeterminate 0.4 cm group
calcifications in the UPPER RIGHT breast and stereotactic
tomosynthesis core needle biopsy of suspicious calcifications
involving the UPPER INNER LEFT breast.

EXAM:
2D AND TOMOSYNTHESIS DIAGNOSTIC BILATERAL MAMMOGRAM POST
STEREOTACTIC BIOPSY x 2

[L CC synth-2D]
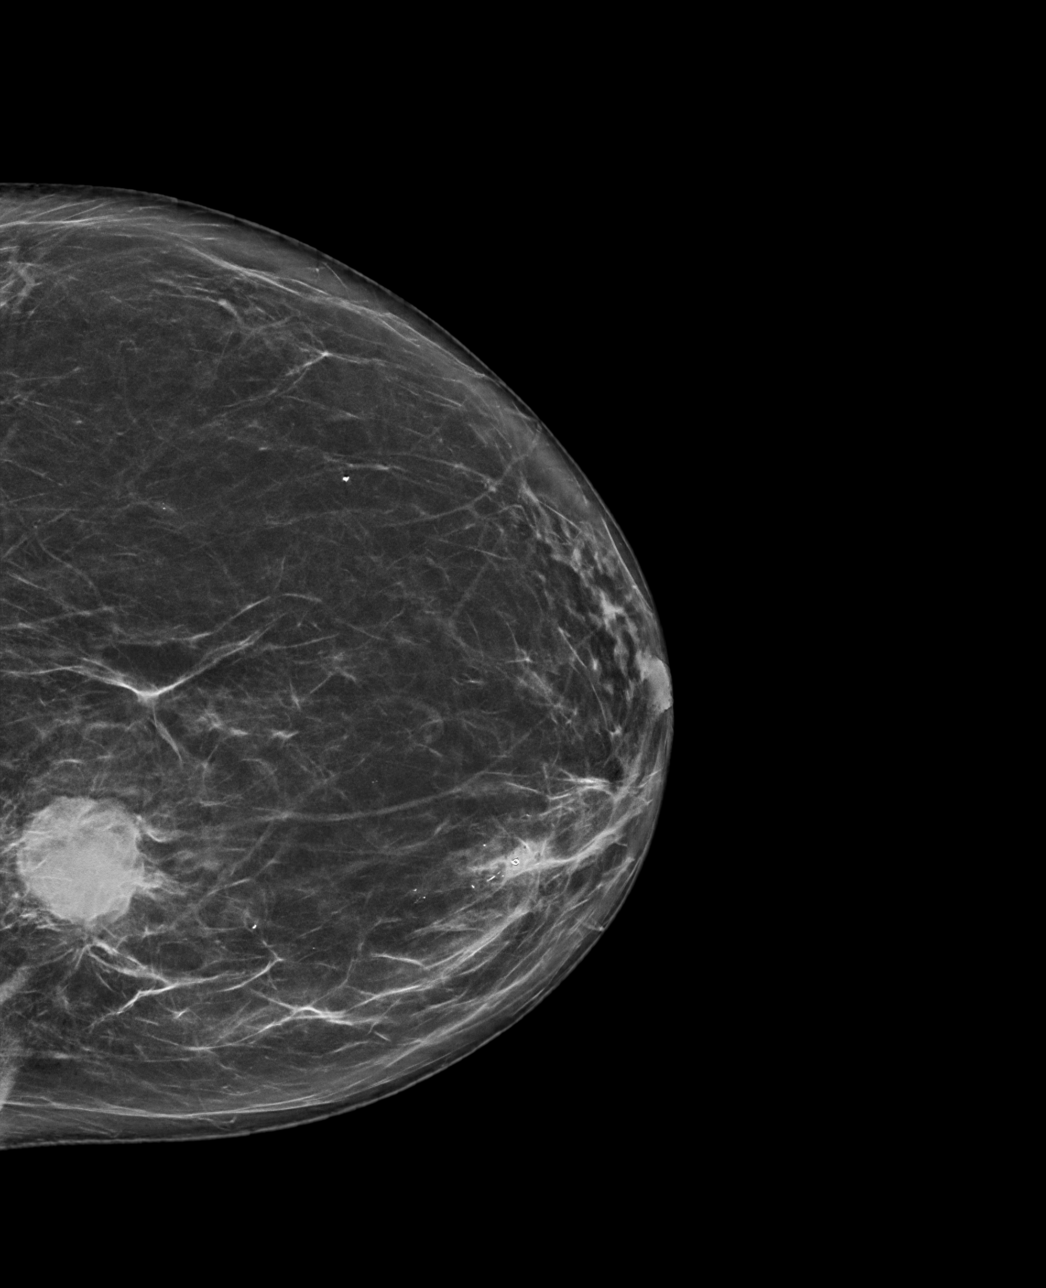

[L ML synth-2D]
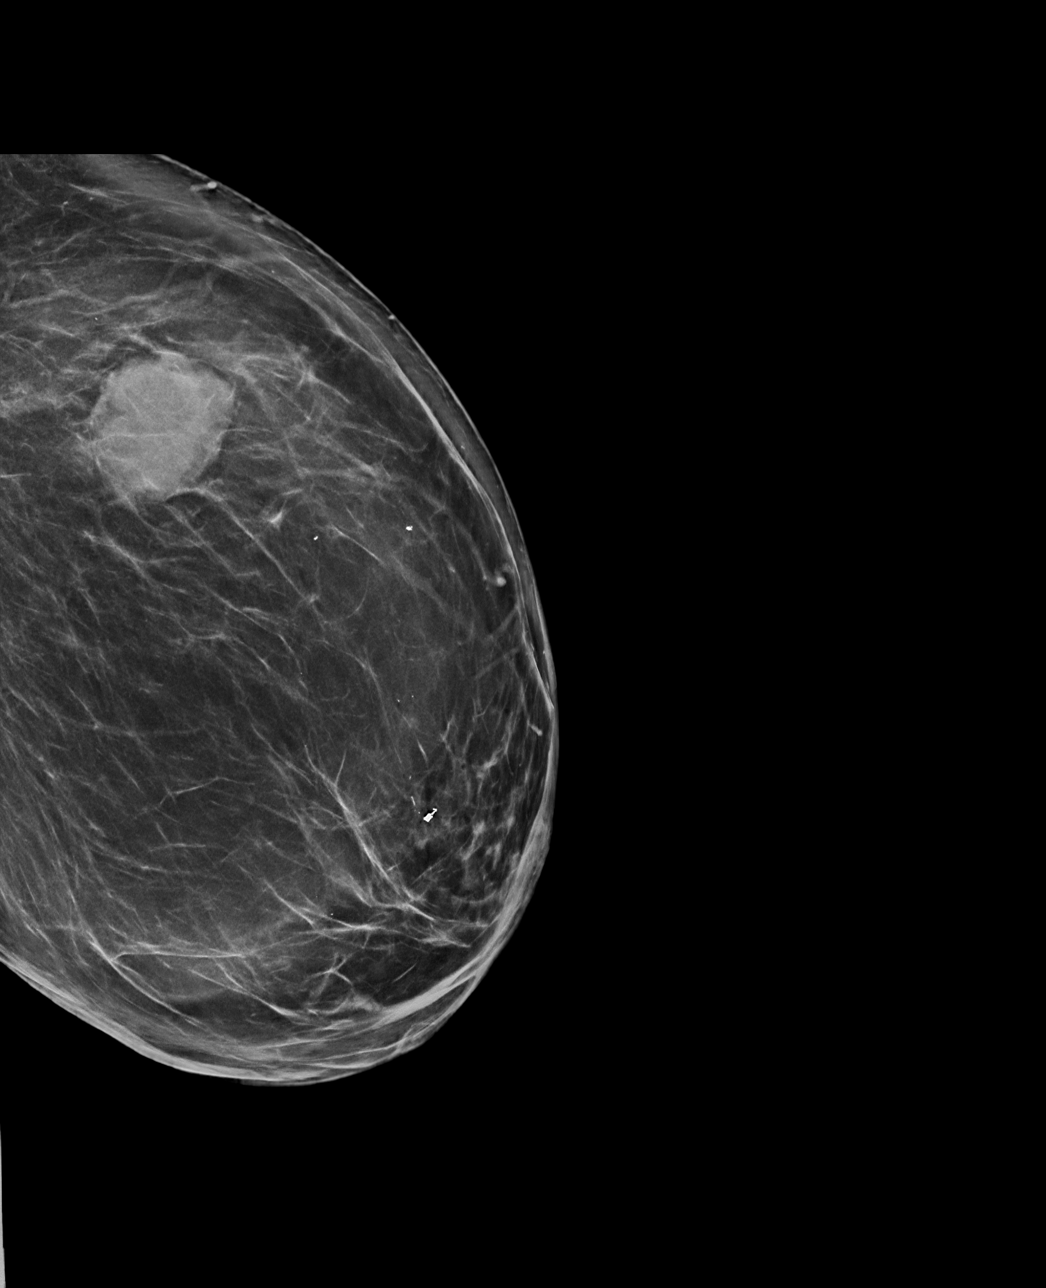

[L CC tomo · tomo slice 39/77.0]
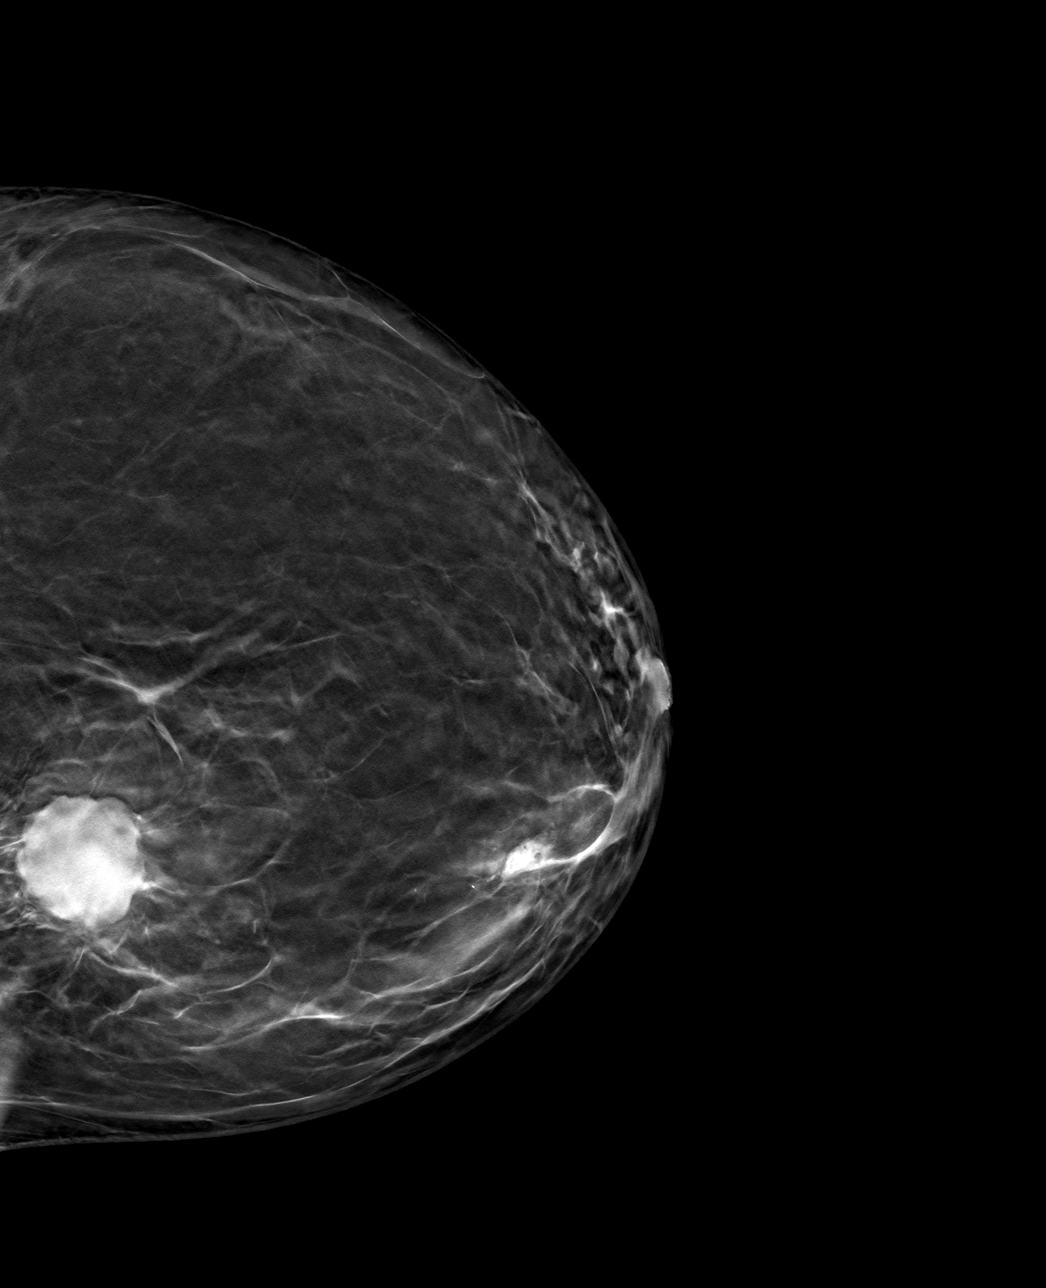

[L ML tomo · tomo slice 49/97.0]
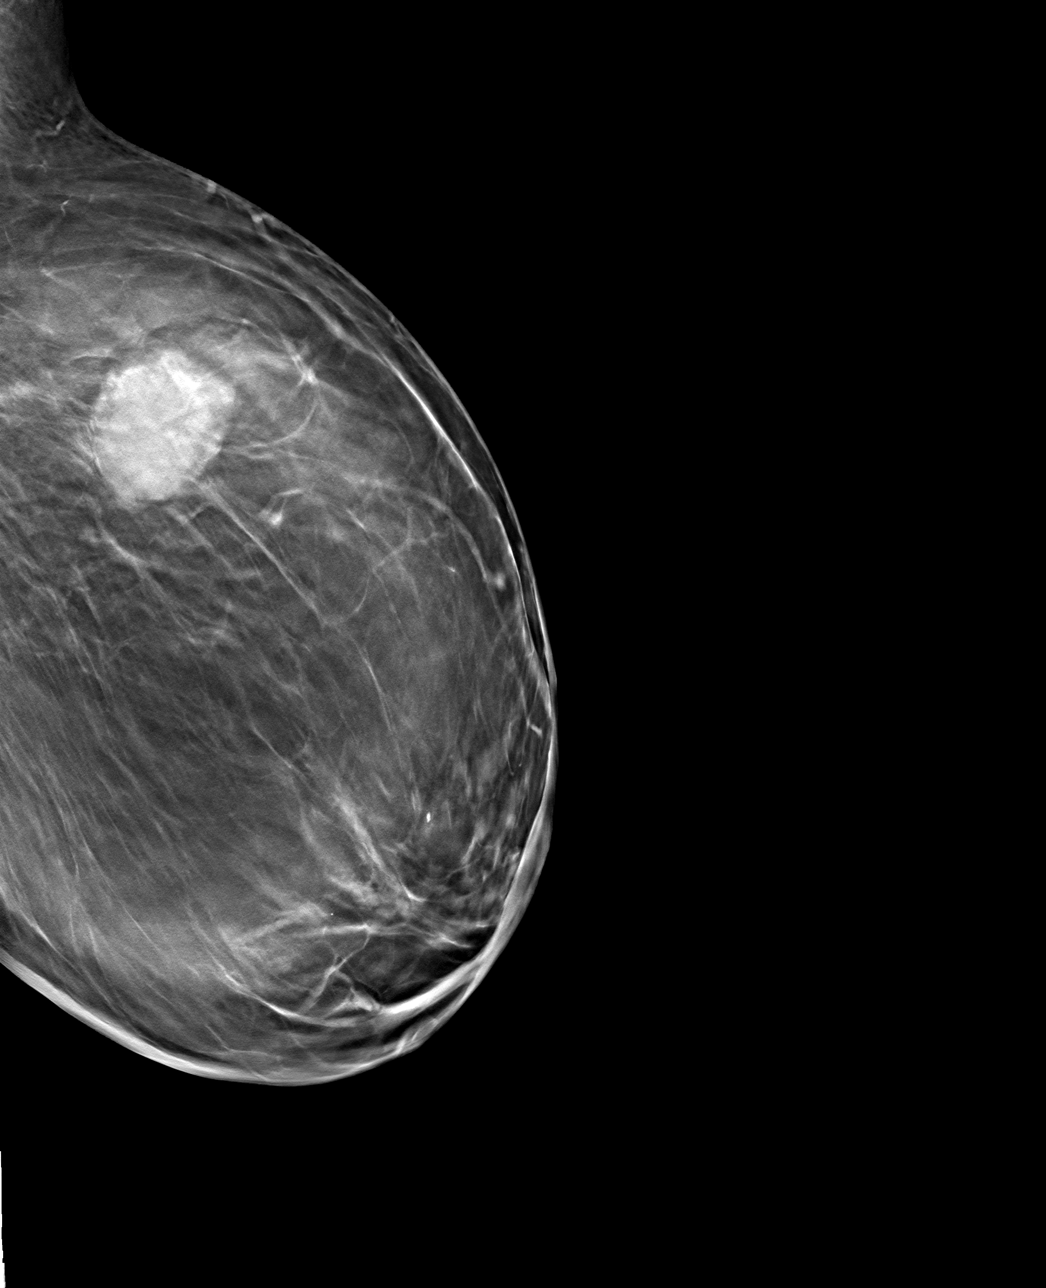

[4 of 12 positions shown; findings below may reference images not displayed]

FINDINGS: Tomosynthesis and synthesized full field CC and mediolateral images
of both breasts were obtained following stereotactic tomosynthesis
guided biopsy of calcifications involving the INNER LEFT breast at
ANTERIOR depth and calcifications involving the UPPER RIGHT breast
at MIDDLE depth.

The coil shaped tissue marking clip is appropriately positioned at
the site of the biopsied calcifications in the INNER LEFT breast at
ANTERIOR depth. The clip is at the ANTERIOR margin of the biopsied
calcifications. Residual calcifications extend approximately 2 cm
POSTERIOR to the clip.

The coil shaped tissue marker clip is appropriately positioned at
the site of the biopsied calcifications in the UPPER RIGHT breast at
MIDDLE depth. All of the calcifications were removed with the
biopsy.

Expected post biopsy changes are present at both sites without
evidence of hematoma.
IMPRESSION: 1. Appropriate positioning of the coil shaped tissue marking clip at
the site of the biopsied calcifications in the INNER LEFT breast at
ANTERIOR depth. Residual calcifications extend approximately 2 cm
POSTERIOR to the clip.
2. Appropriate positioning of the coil shaped tissue marking clip at
the site of the biopsied calcifications in the UPPER RIGHT breast at
MIDDLE depth. All of the calcifications were removed with the
biopsy.

Final Assessment: Post Procedure Mammograms for Marker Placement

## 2020-05-14 IMAGING — MG MM BREAST LOCALIZATION CLIP
4 series · 4 of 12 positions shown · non-contrast
Comparison: Previous exam(s).

CLINICAL DATA: Confirmation of clip placement after stereotactic
tomosynthesis core needle biopsy of in indeterminate 0.4 cm group
calcifications in the UPPER RIGHT breast and stereotactic
tomosynthesis core needle biopsy of suspicious calcifications
involving the UPPER INNER LEFT breast.

EXAM:
2D AND TOMOSYNTHESIS DIAGNOSTIC BILATERAL MAMMOGRAM POST
STEREOTACTIC BIOPSY x 2

[R ML synth-2D]
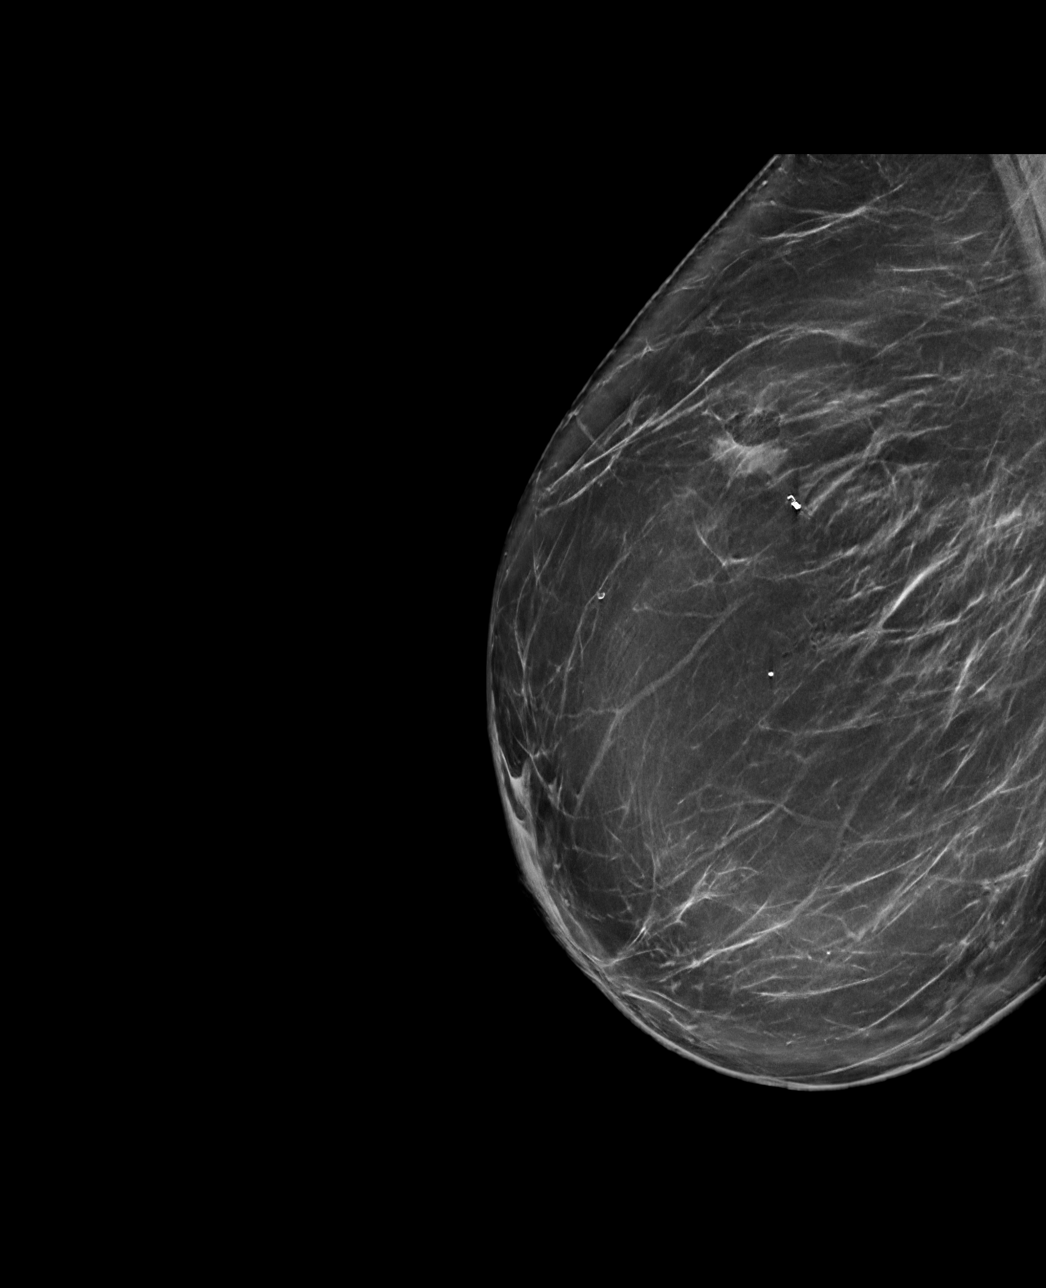

[R CC synth-2D]
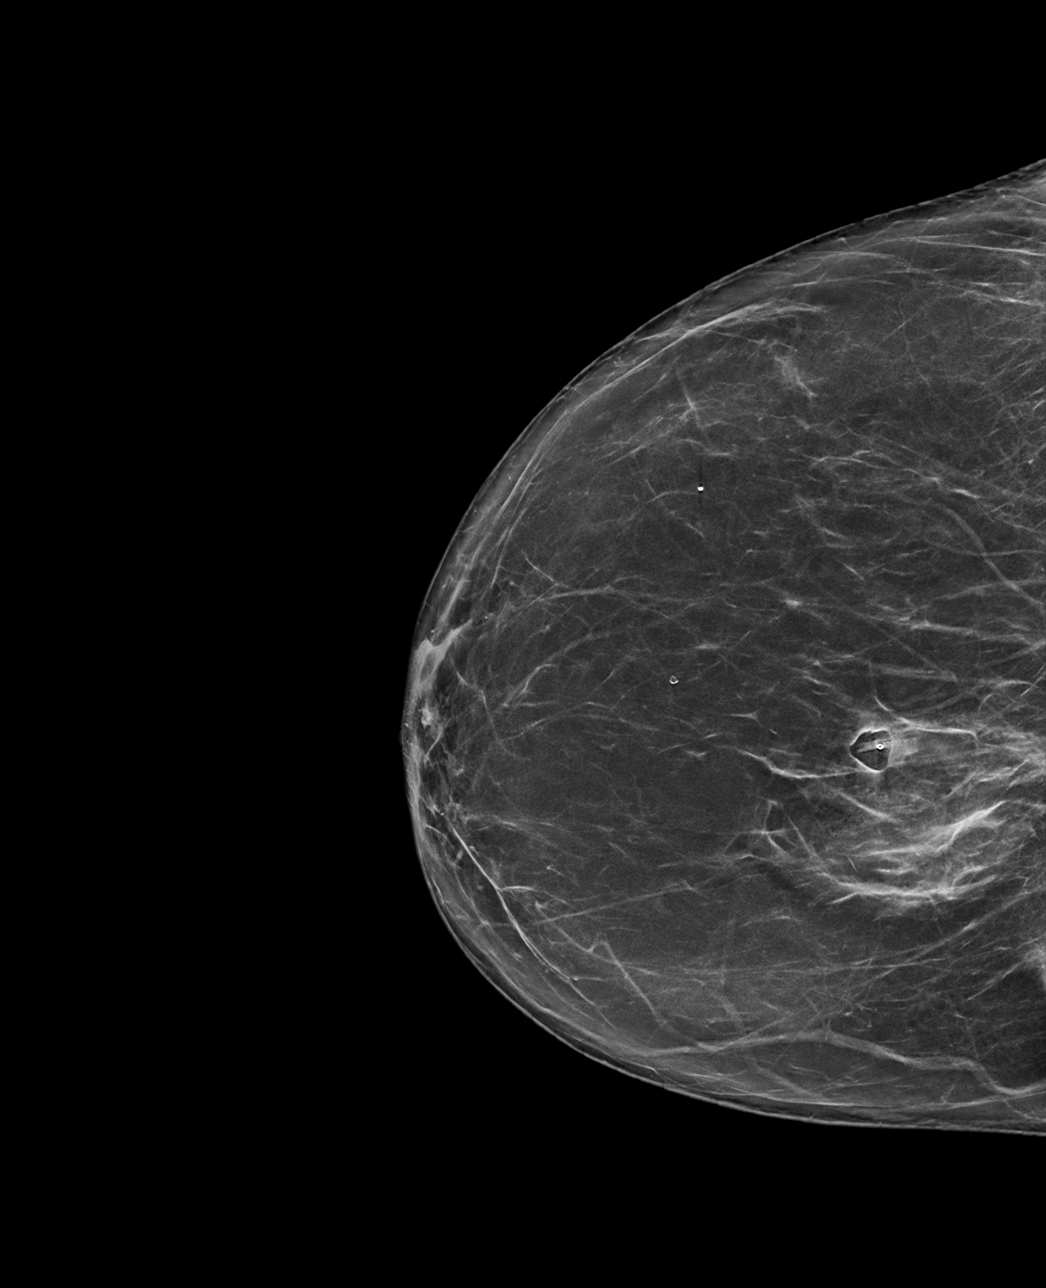

[R CC tomo · tomo slice 39/76.0]
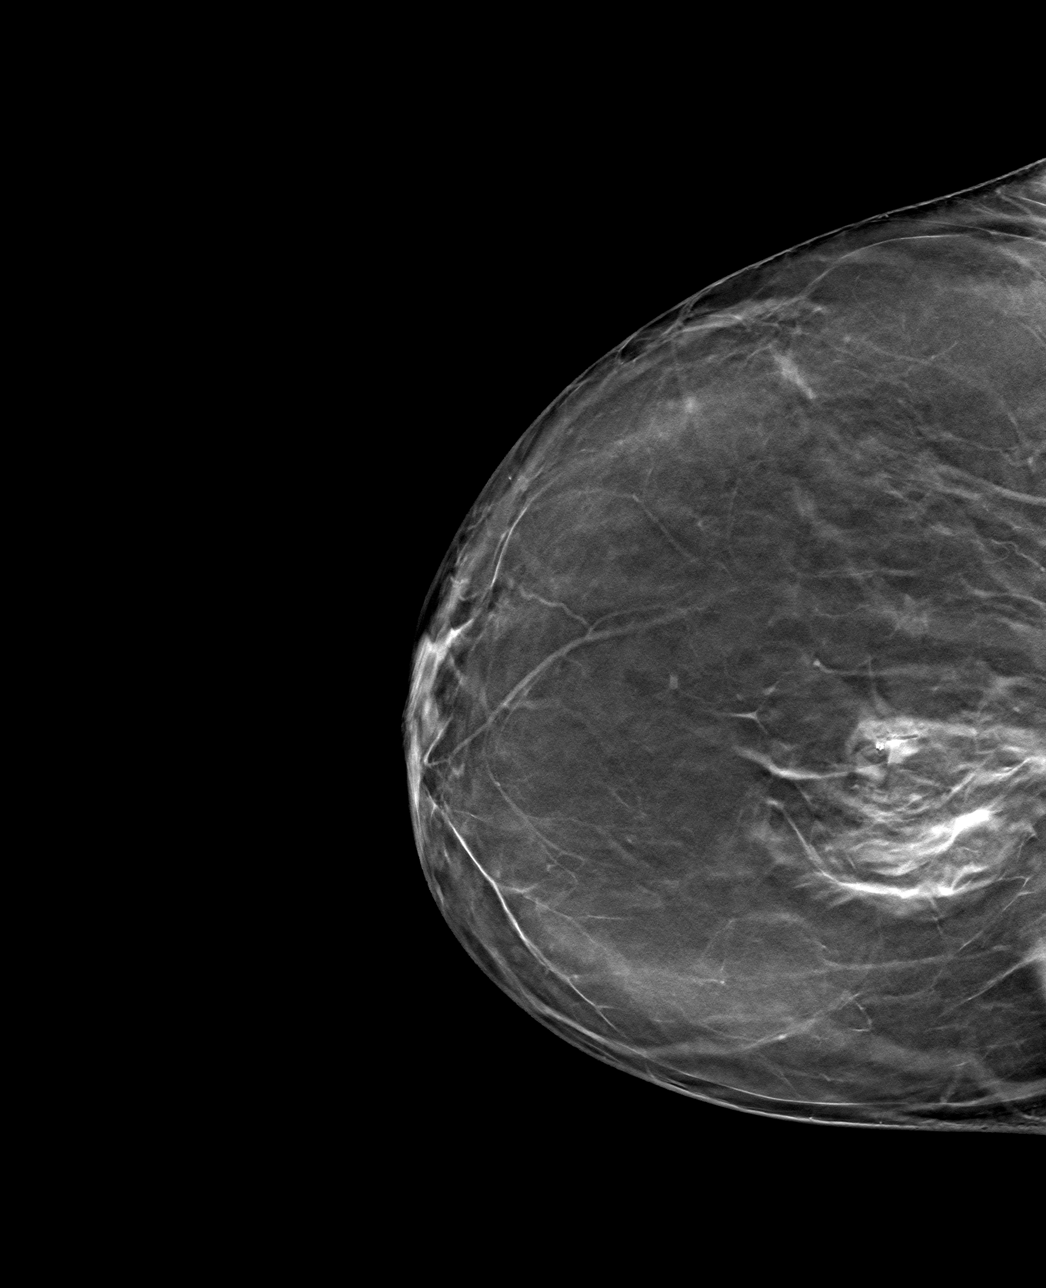

[R ML tomo · tomo slice 45/90.0]
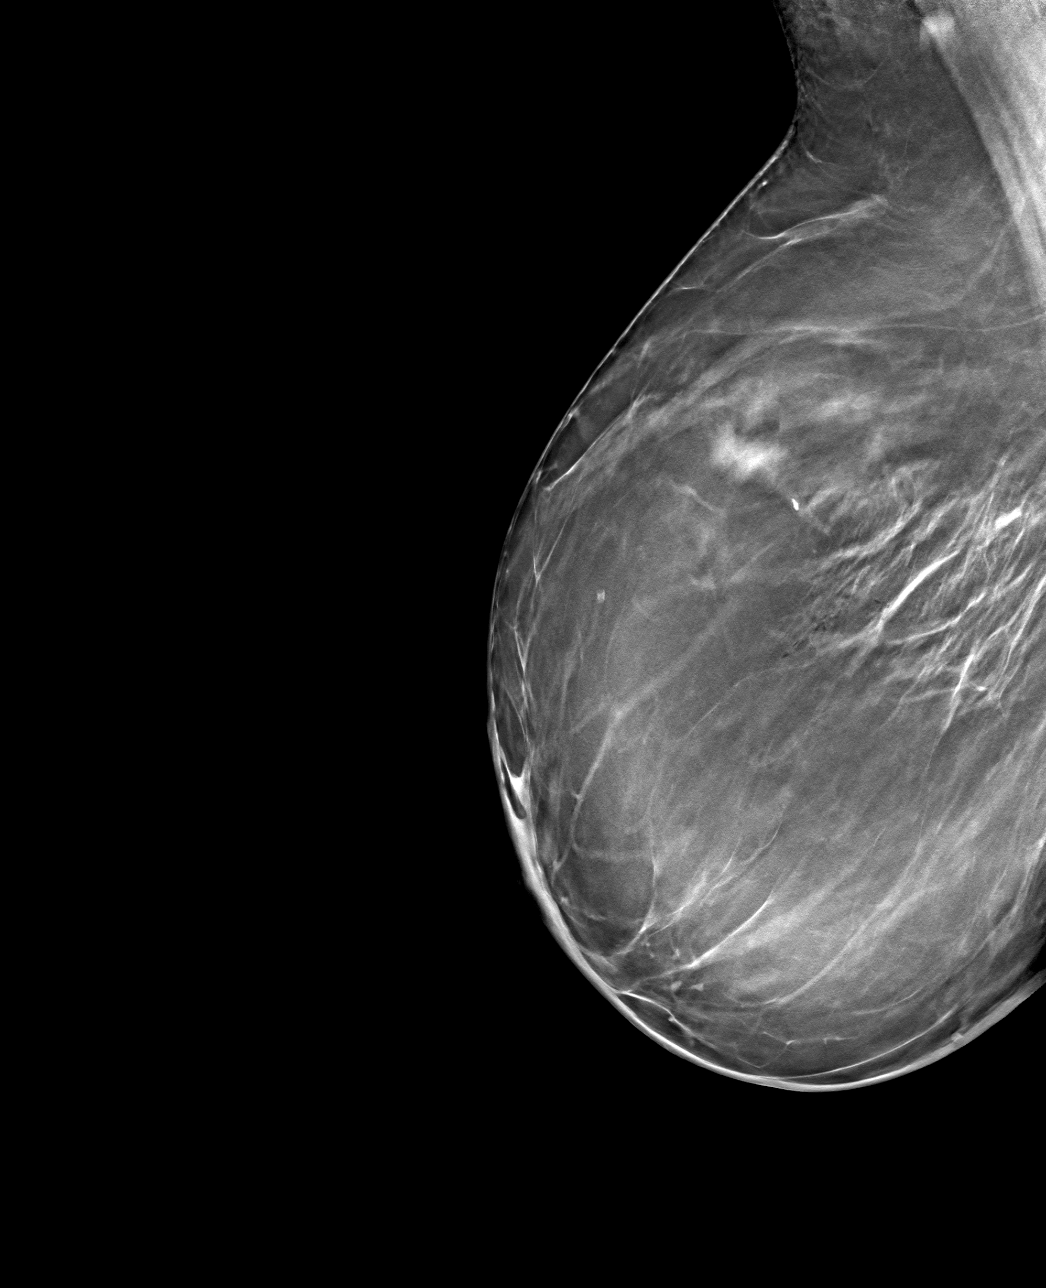

[4 of 12 positions shown; findings below may reference images not displayed]

FINDINGS: Tomosynthesis and synthesized full field CC and mediolateral images
of both breasts were obtained following stereotactic tomosynthesis
guided biopsy of calcifications involving the INNER LEFT breast at
ANTERIOR depth and calcifications involving the UPPER RIGHT breast
at MIDDLE depth.

The coil shaped tissue marking clip is appropriately positioned at
the site of the biopsied calcifications in the INNER LEFT breast at
ANTERIOR depth. The clip is at the ANTERIOR margin of the biopsied
calcifications. Residual calcifications extend approximately 2 cm
POSTERIOR to the clip.

The coil shaped tissue marker clip is appropriately positioned at
the site of the biopsied calcifications in the UPPER RIGHT breast at
MIDDLE depth. All of the calcifications were removed with the
biopsy.

Expected post biopsy changes are present at both sites without
evidence of hematoma.
IMPRESSION: 1. Appropriate positioning of the coil shaped tissue marking clip at
the site of the biopsied calcifications in the INNER LEFT breast at
ANTERIOR depth. Residual calcifications extend approximately 2 cm
POSTERIOR to the clip.
2. Appropriate positioning of the coil shaped tissue marking clip at
the site of the biopsied calcifications in the UPPER RIGHT breast at
MIDDLE depth. All of the calcifications were removed with the
biopsy.

Final Assessment: Post Procedure Mammograms for Marker Placement

## 2020-05-17 ENCOUNTER — Ambulatory Visit
Admission: RE | Admit: 2020-05-17 | Discharge: 2020-05-17 | Disposition: A | Discharge status: Home / Self Care | Payer: BC Managed Care – PPO | Source: Ambulatory Visit | Attending: Nurse Practitioner | Admitting: Nurse Practitioner

## 2020-05-17 ENCOUNTER — Other Ambulatory Visit: Payer: Self-pay

## 2020-05-17 ENCOUNTER — Other Ambulatory Visit: Payer: Self-pay | Admitting: Nurse Practitioner

## 2020-05-17 DIAGNOSIS — R921 Mammographic calcification found on diagnostic imaging of breast: Secondary | ICD-10-CM

## 2020-05-17 DIAGNOSIS — R599 Enlarged lymph nodes, unspecified: Secondary | ICD-10-CM

## 2020-05-17 IMAGING — MG MM BREAST LOCALIZATION CLIP
6 series · 6 of 18 positions shown · non-contrast
Comparison: Previous exam(s).

CLINICAL DATA: 68-year-old female status post ultrasound-guided
biopsy of the left breast and axilla.

EXAM:
DIAGNOSTIC LEFT MAMMOGRAM POST ULTRASOUND BIOPSY

[L CC synth-2D]
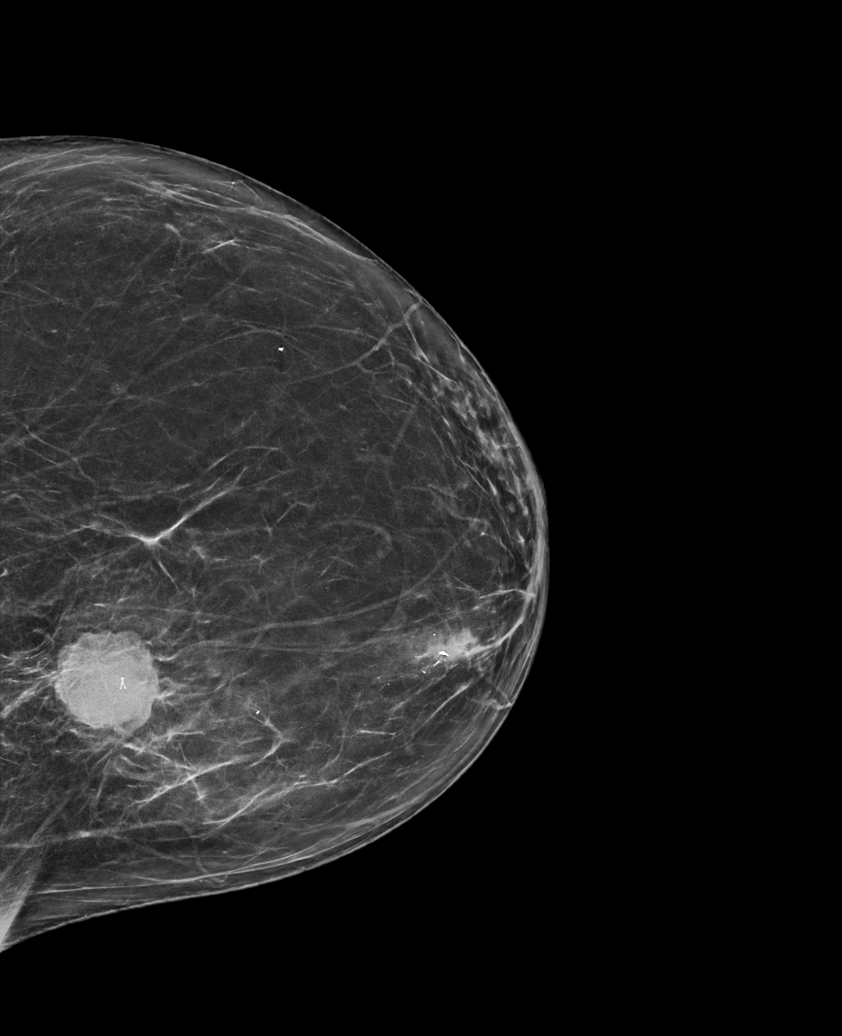

[L ML synth-2D]
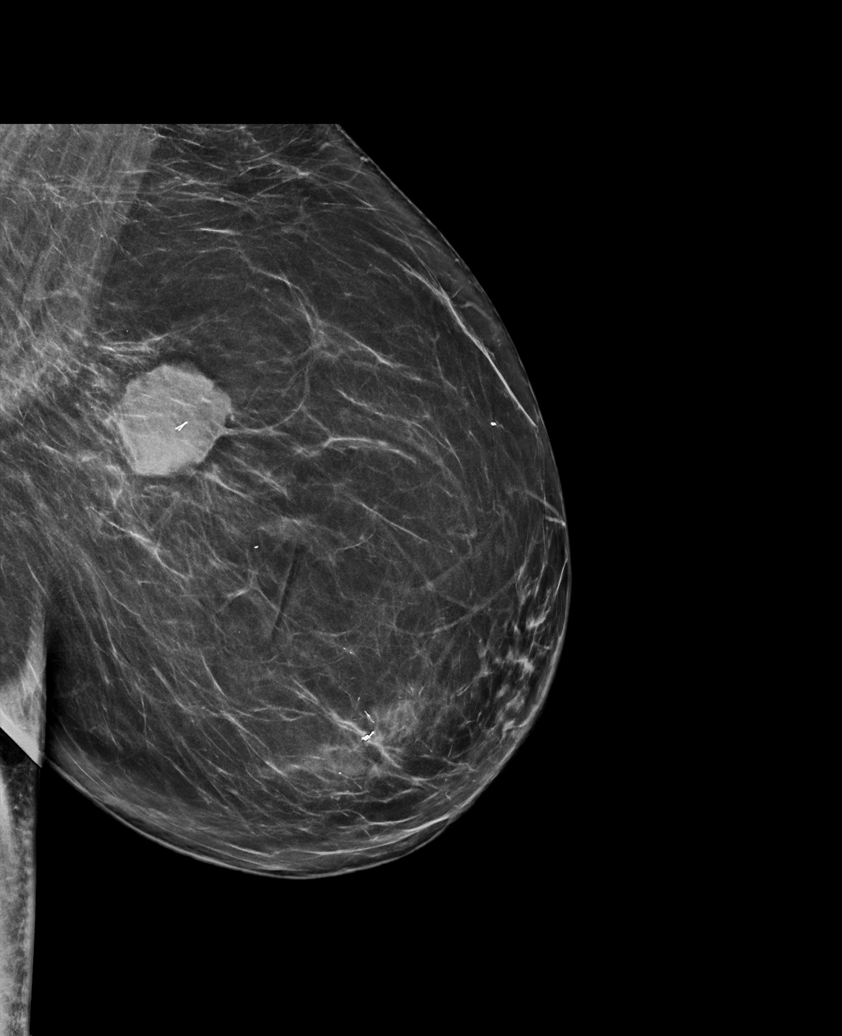

[L MLO synth-2D]
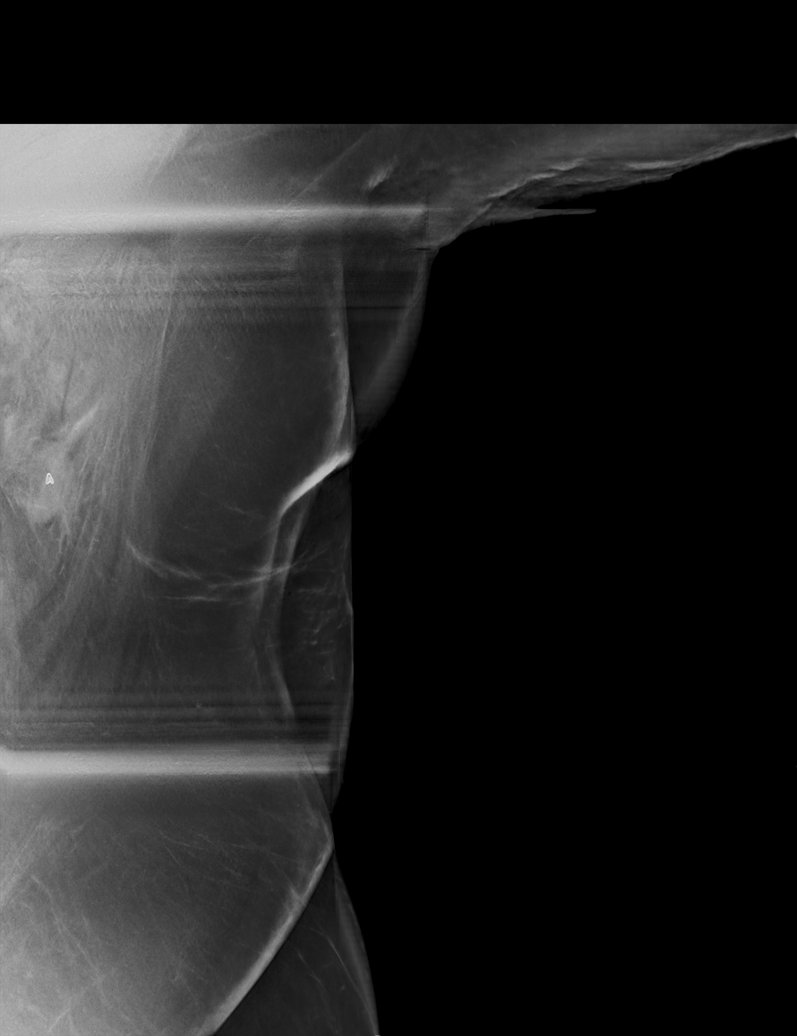

[L CC tomo · tomo slice 35/68.0]
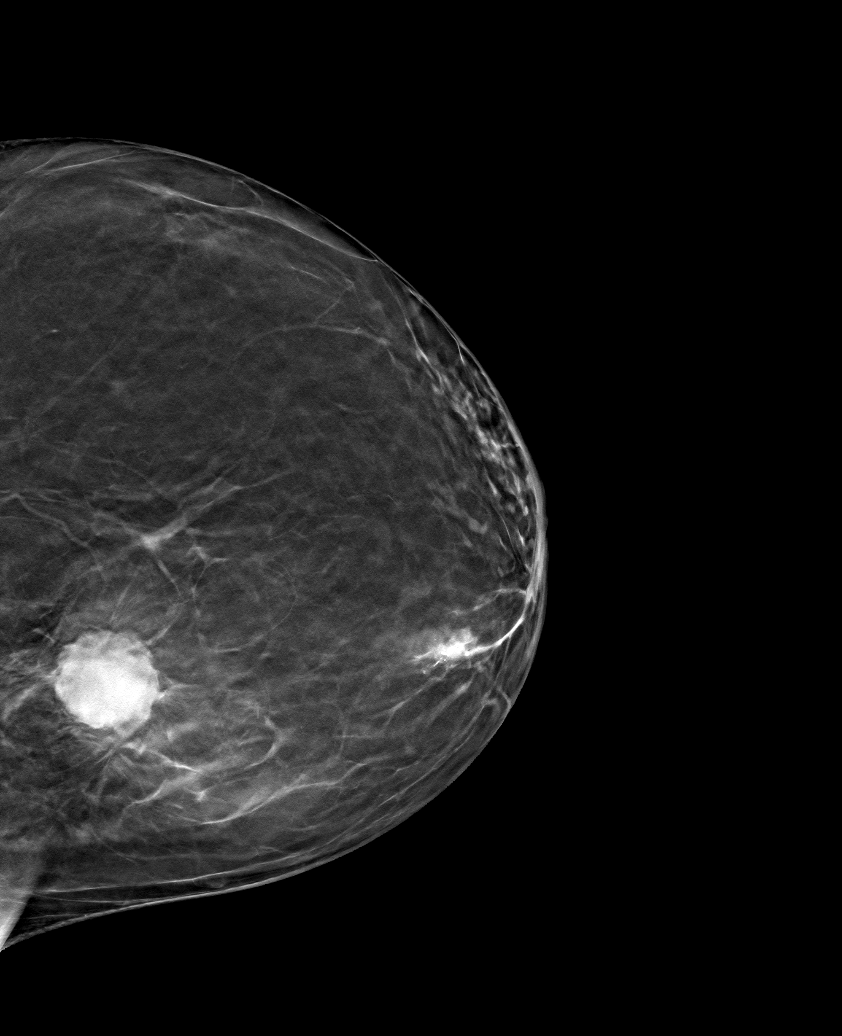

[L MLO tomo · tomo slice 61/120.0]
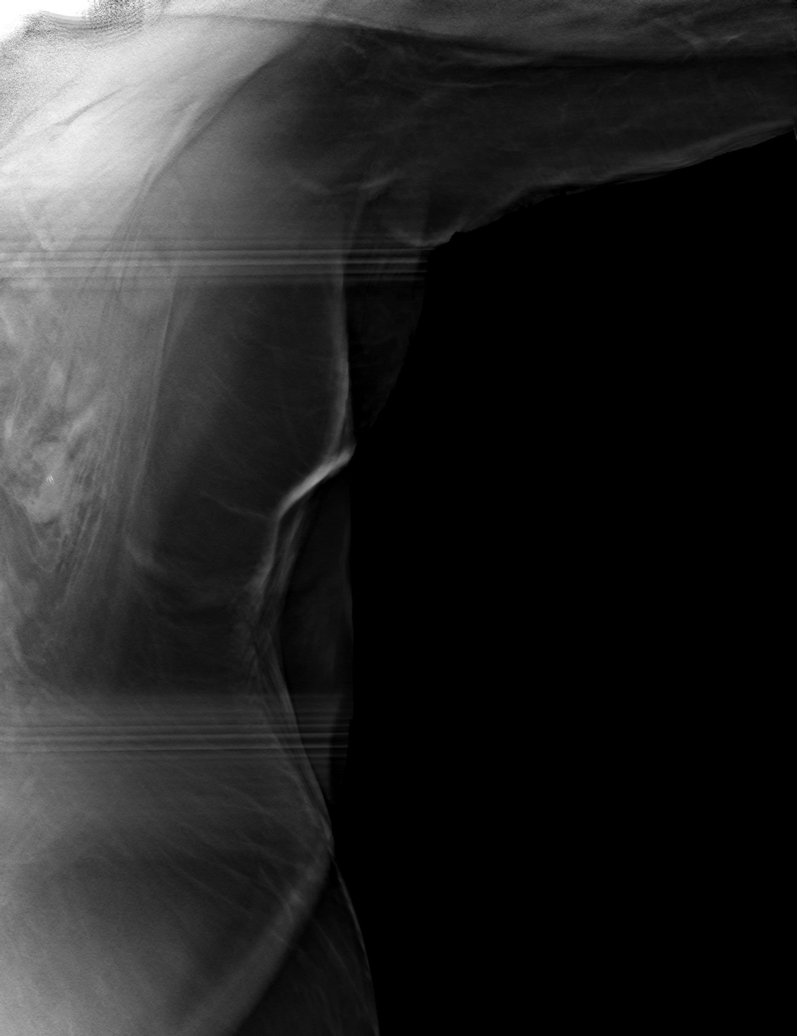

[L ML tomo · tomo slice 41/81.0]
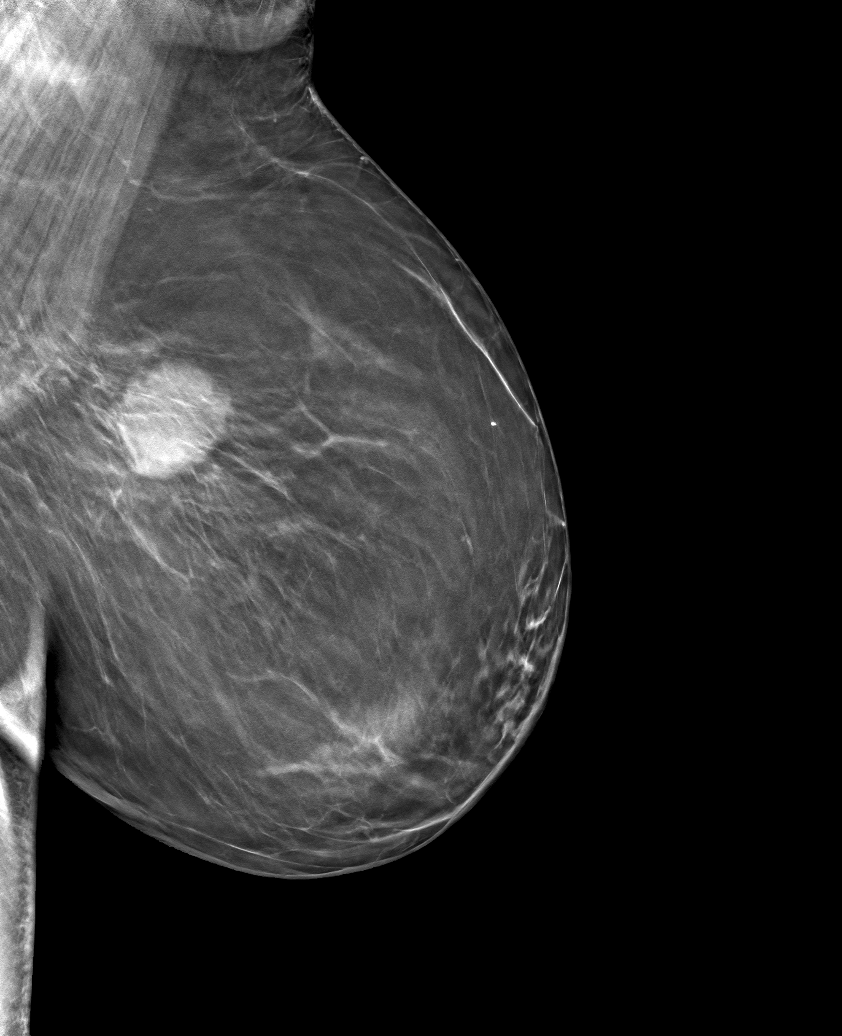

[6 of 18 positions shown; findings below may reference images not displayed]

FINDINGS: Mammographic images were obtained following ultrasound guided biopsy
of the left breast and axilla. A ribbon shaped clip is identified
within the hyperdense mass in the upper inner quadrant of the left
breast. A heart shaped clip is identified within a left axillary
lymph node.
IMPRESSION: 1. Appropriate positioning of both post biopsy marking clips at the
sites of biopsy in the upper inner left breast and left axilla.
2. Please note, the Q-shaped clip was not placed in the left
axillary lymph node due to the patient's nickel allergy. A titanium
heart shaped clip was placed instead.

Final Assessment: Post Procedure Mammograms for Marker Placement

## 2020-05-17 IMAGING — US US BREAST BX W LOC DEV 1ST LESION IMG BX SPEC US GUIDE*L*
1 series · 12 of 15 positions shown · non-contrast
Comparison: Previous exam(s).
COMPARISON: Previous exam(s).

Addendum:
CLINICAL DATA: 68-year-old female with a suspicious left breast
mass and left axillary lymph node. Recent stereotactic biopsies of
the bilateral breast demonstrated high-grade DCIS on the left and
benign findings on the right.

EXAM:
ULTRASOUND GUIDED LEFT BREAST CORE NEEDLE BIOPSY
ULTRASOUND-GUIDED LEFT AXILLARY CORE NEEDLE BIOPSY

[Series 1: us breast bx w loc dev 1st lesion img bx spec us g · 0.07mm/px · 12 of 15 slices shown]
[im 1/15]
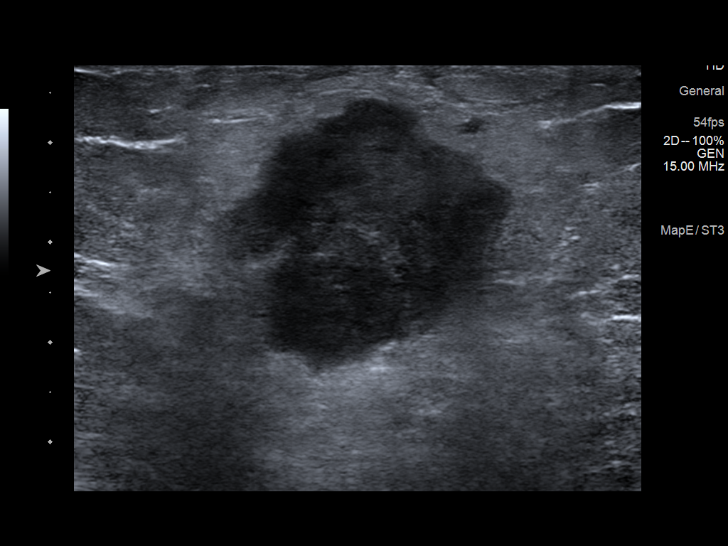
[im 2/15]
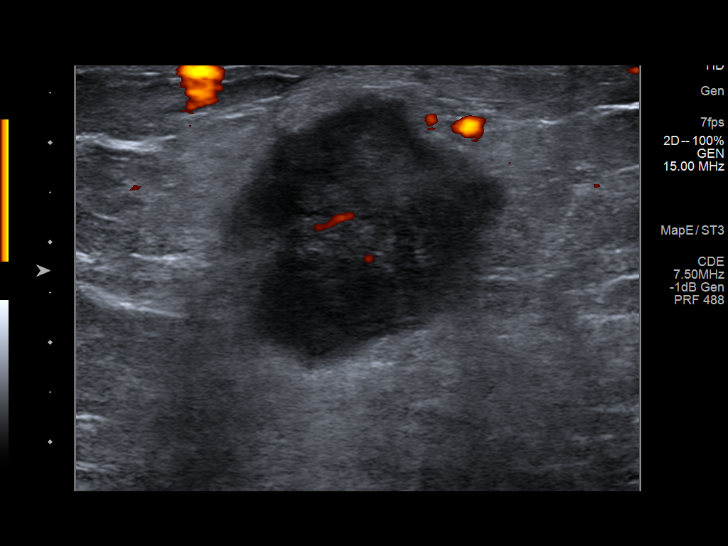
[im 4/15]
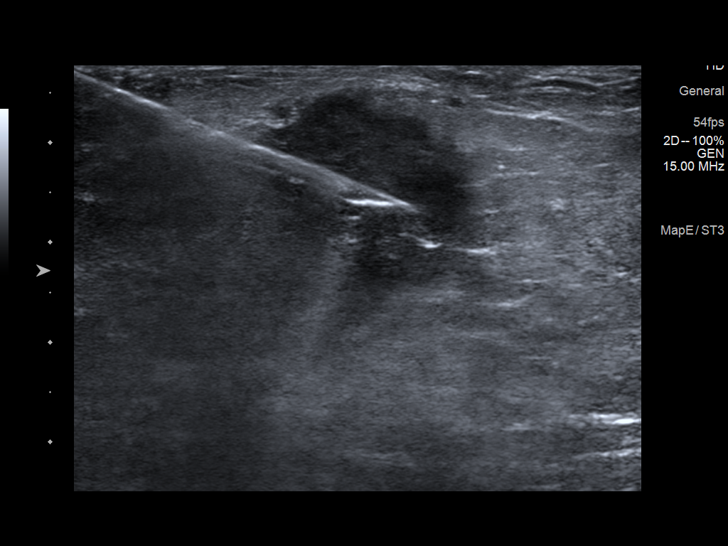
[im 5/15]
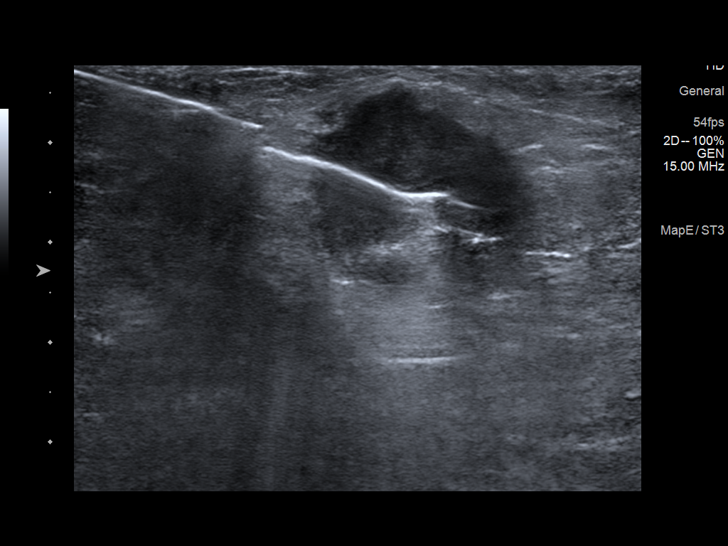
[im 6/15]
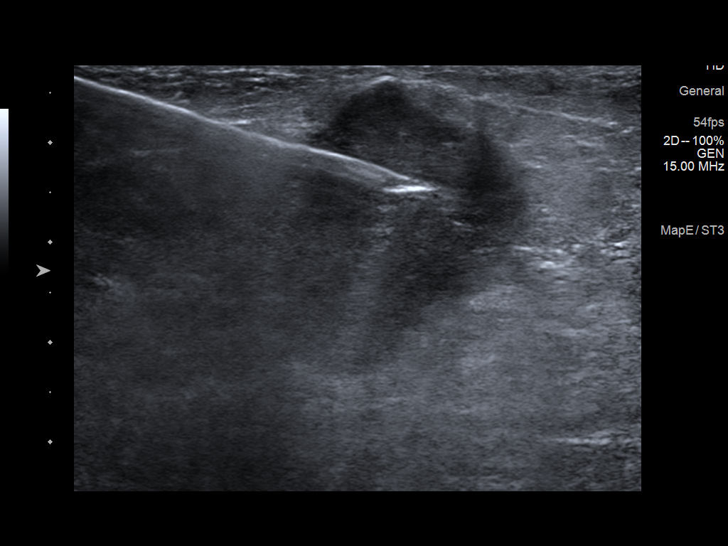
[im 7/15]
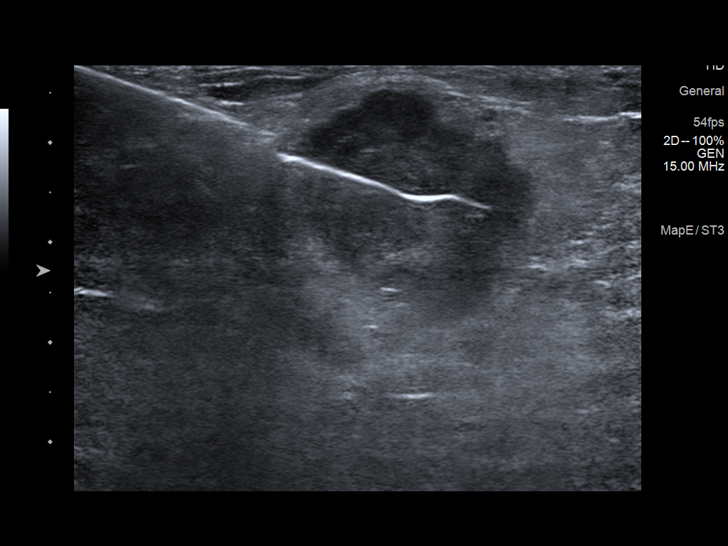
[im 9/15]
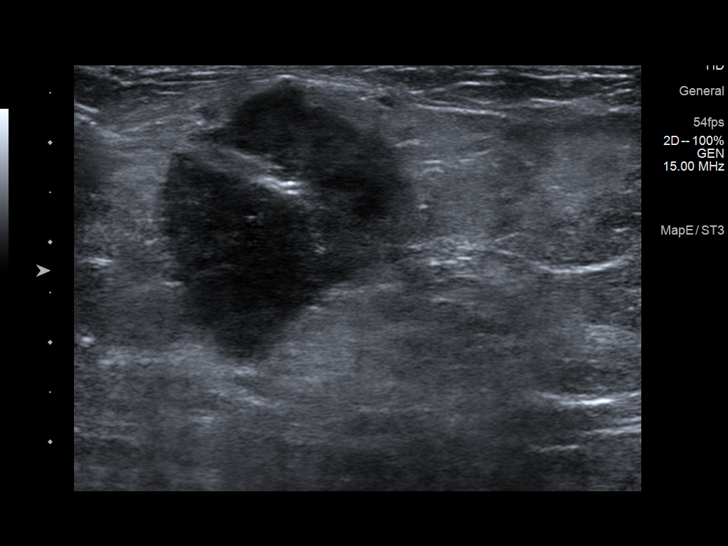
[im 10/15]
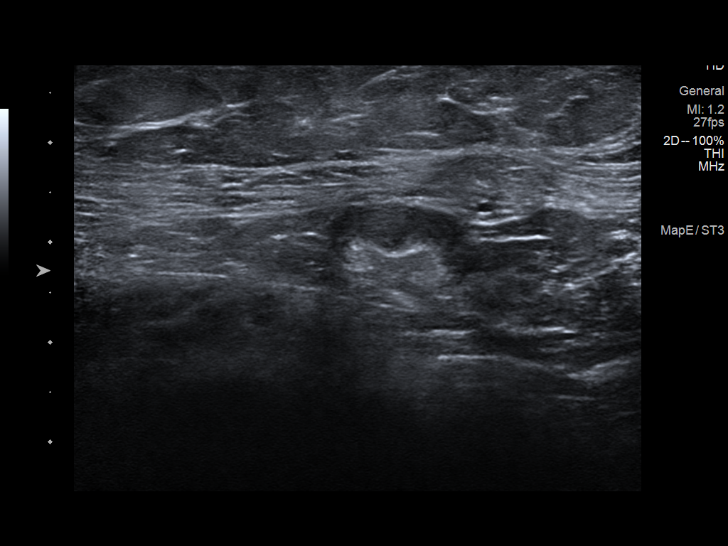
[im 11/15]
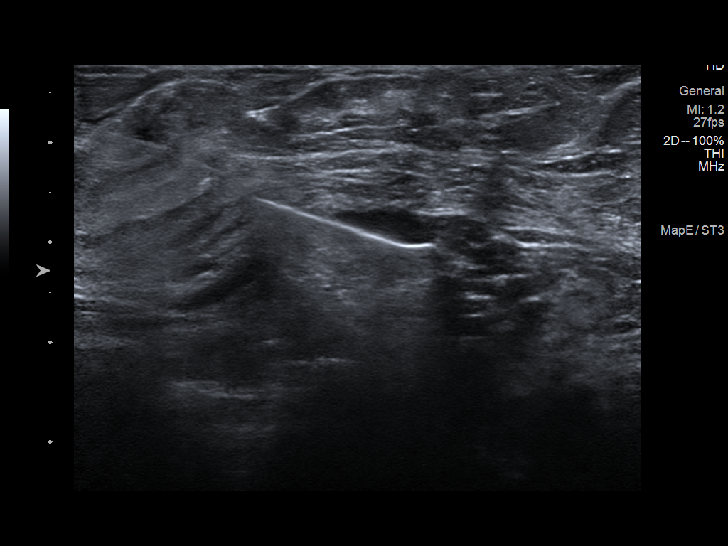
[im 12/15]
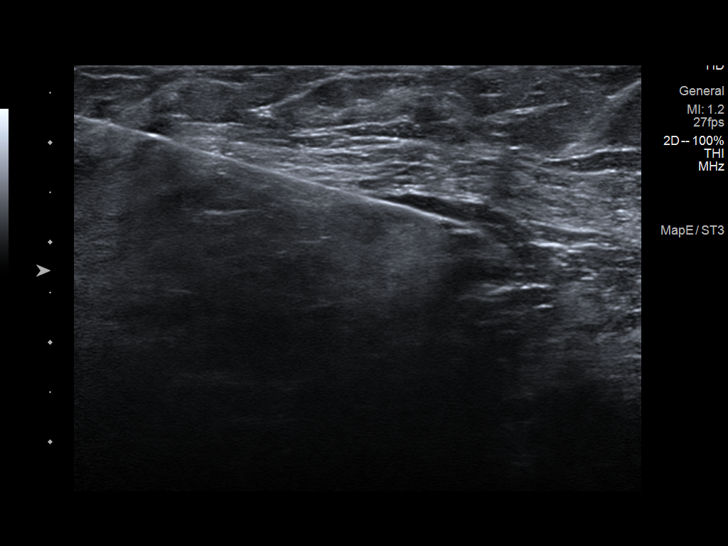
[im 14/15]
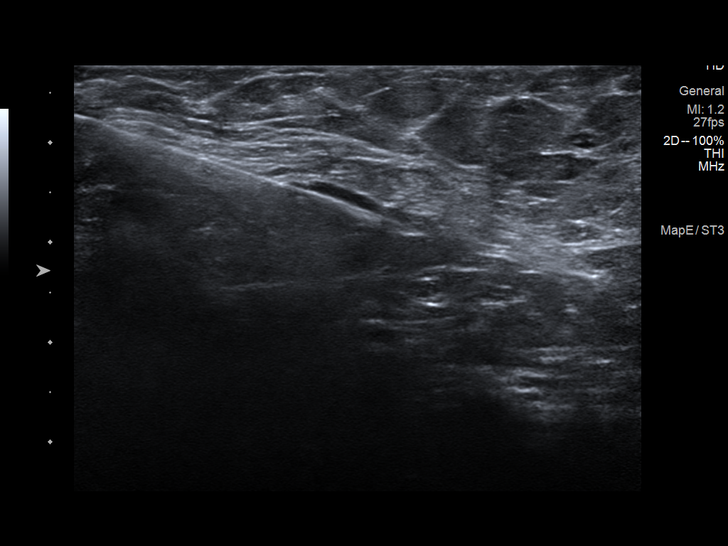
[im 15/15]
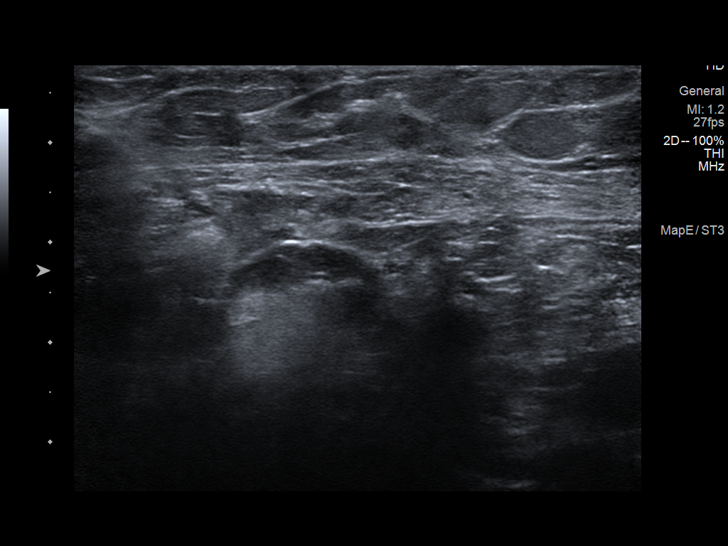

[12 of 15 positions shown; findings below may reference images not displayed]



Lesion quadrant: Upper inner quadrant

Using sterile technique and 1% Lidocaine as local anesthetic, under
direct ultrasound visualization, a 12 gauge ALAMAR device was
used to perform biopsy of a mass at the [DATE] position using a
inferior approach. At the conclusion of the procedure ribbon shaped
tissue marker clip was deployed into the biopsy cavity.

Using sterile technique and 1% Lidocaine as local anesthetic, under
direct ultrasound visualization, a 14 gauge ALAMAR device was
used to perform biopsy of a left axillary lymph node using a
inferior approach. At the conclusion of the procedure heart shaped
tissue marker clip was deployed into the biopsy cavity. The patient
reports a nickel allergy. Therefore, the Q shaped clip could not be
used. HydroMARK clips were not available on the day of biopsy. A
titanium, heart shaped clip was therefore deployed.

Follow up 2 view mammogram was performed and dictated separately.
IMPRESSION: Ultrasound guided biopsy of the left breast and axilla. No apparent
complications.

ADDENDUM:
Pathology revealed GRADE III INVASIVE DUCTAL CARCINOMA of the LEFT
breast, [DATE], [EF]. This was found to be concordant by Dr. ALAMAR
ALAMAR.

Pathology revealed REACTIVE LYMPH NODE of the LEFT axilla. This was
found to be concordant by Dr. ALAMAR.

Pathology results were discussed with the patient by telephone. The
patient reported doing well after the biopsies with tenderness at
the sites. Post biopsy instructions and care were reviewed and
questions were answered. The patient was encouraged to call The

Surgical consultation has been arranged with Dr. ALAMAR at
[REDACTED] on [DATE].

Pathology results reported by ALAMAR RN on [DATE].



Lesion quadrant: Upper inner quadrant

Using sterile technique and 1% Lidocaine as local anesthetic, under
direct ultrasound visualization, a 12 gauge ALAMAR device was
used to perform biopsy of a mass at the [DATE] position using a
inferior approach. At the conclusion of the procedure ribbon shaped
tissue marker clip was deployed into the biopsy cavity.

Using sterile technique and 1% Lidocaine as local anesthetic, under
direct ultrasound visualization, a 14 gauge ALAMAR device was
used to perform biopsy of a left axillary lymph node using a
inferior approach. At the conclusion of the procedure heart shaped
tissue marker clip was deployed into the biopsy cavity. The patient
reports a nickel allergy. Therefore, the Q shaped clip could not be
used. HydroMARK clips were not available on the day of biopsy. A
titanium, heart shaped clip was therefore deployed.

Follow up 2 view mammogram was performed and dictated separately.
IMPRESSION: Ultrasound guided biopsy of the left breast and axilla. No apparent
complications.

## 2020-05-26 ENCOUNTER — Encounter: Payer: Self-pay | Admitting: Adult Health

## 2020-05-26 DIAGNOSIS — Z171 Estrogen receptor negative status [ER-]: Secondary | ICD-10-CM | POA: Insufficient documentation

## 2020-05-26 DIAGNOSIS — C50812 Malignant neoplasm of overlapping sites of left female breast: Secondary | ICD-10-CM | POA: Insufficient documentation

## 2020-05-28 ENCOUNTER — Ambulatory Visit: Payer: Self-pay | Admitting: Surgery

## 2020-05-28 ENCOUNTER — Telehealth: Payer: Self-pay | Admitting: Adult Health

## 2020-05-28 ENCOUNTER — Other Ambulatory Visit: Payer: Self-pay

## 2020-05-28 ENCOUNTER — Other Ambulatory Visit: Payer: Self-pay | Admitting: Surgery

## 2020-05-28 DIAGNOSIS — Z171 Estrogen receptor negative status [ER-]: Secondary | ICD-10-CM

## 2020-05-28 DIAGNOSIS — C50912 Malignant neoplasm of unspecified site of left female breast: Secondary | ICD-10-CM

## 2020-05-28 DIAGNOSIS — C50812 Malignant neoplasm of overlapping sites of left female breast: Secondary | ICD-10-CM

## 2020-05-28 NOTE — Telephone Encounter (Signed)
Spoke with patient about scheduling genetics appointment. Patient would like to wait until MRI appointment is scheduled and then schedule the genetics appointment according to that. She said she would call back to schedule appointment.

## 2020-05-28 NOTE — H&P (Signed)
History of Present Illness Jo Mills. Cleo Santucci MD; 05/28/2020 12:36 PM) The patient is a Jo Mills who presents with breast cancer. PCP - Valentina Shaggy NP Reason for eval - new diagnosis left breast IDC/ DCIS  This is a Jo Mills in good health who presents with a palpable mass in her left breast. The mass has been present for at least 5 months. She has not had a mammogram since 2013. Mammogram and ultrasound showed 2 areas in the left upper inner quadrant of concern. The palpable mass is 2.7 x 2.6 x 2.5 cm located at 08/14/09 centimeters from the nipple. 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 There was an area on the right side that was biopsied and revealed a fibroadenoma. The patient is from [place] and comes down to see [place] for her initial evaluation.    CLINICAL DATA: Palpable mass in the LEFT breast since the end of March.  EXAM: DIGITAL DIAGNOSTIC BILATERAL MAMMOGRAM WITH CAD AND TOMO  ULTRASOUND LEFT BREAST  COMPARISON: 05/01/2012 and 03/25/2010  ACR Breast Density Category a: The breast tissue is almost entirely fatty.  FINDINGS: RIGHT BREAST:  Mammogram: Within the UPPER central portion of the RIGHT breast there is a group of calcifications further evaluated with magnified views. These views demonstrate a 4 millimeter group of mildly heterogeneous calcifications associated with parenchymal asymmetry. Otherwise RIGHT breast is negative. Mammographic images were processed with CAD.  LEFT BREAST:  Mammogram: There is an oval mass with irregular margins in the UPPER INNER QUADRANT of the LEFT breast. In the LOWER INNER QUADRANT of the LEFT breast, anterior depth, a group of calcifications is further evaluated with magnified  views. These views demonstrate a group of linear and punctate calcifications spanning 0.5 x 1.0 centimeters. Mammographic images were processed with CAD.  Physical Exam: I palpate a discrete mass in the [place] of the LEFT breast.  Ultrasound: Targeted ultrasound is performed, showing an oval mass with irregular margins in in the 10:30 o'clock location of the LEFT breast 10 centimeters from the nipple. Mass is 2.7 x 2.6 x 2.5 centimeters. Internal blood flow is present on Doppler evaluation.  Evaluation of the LEFT axilla shows a single lymph node with mildly thickened cortex of 4 millimeters. Other lymph nodes have normal morphology.  IMPRESSION: 1. Indeterminate 4 millimeter group of calcifications in the UPPER central RIGHT breast. 2. Suspicious mass in the 10:30 o'clock location of the LEFT breast. 3. Suspicious calcifications in the anterior LOWER INNER QUADRANT of the LEFT breast. 4. Indeterminate single LEFT axillary lymph node.  RECOMMENDATION: 1. Recommend stereotactic guided core biopsy of calcifications in the UPPER central RIGHT breast in the anterior LOWER INNER QUADRANT of the LEFT breast. 2. Recommend ultrasound-guided core biopsy of mass in the 10:30 o'clock location of the LEFT breast and mildly prominent LEFT axillary lymph node. 3. Recommend biopsies be scheduled for 2 separate days, scheduled on 05/14/2020 and 05/17/2020.  I have discussed the findings and recommendations with the patient. If applicable, a reminder letter will be sent to the patient regarding the next appointment.  BI-RADS CATEGORY 5: Highly suggestive of malignancy.   Electronically Signed By: Nolon Nations M.D. On: 05/07/2020 13:18  CLINICAL DATA: Jo Mills status post ultrasound-guided biopsy of the left breast and axilla.  EXAM: DIAGNOSTIC LEFT MAMMOGRAM POST ULTRASOUND BIOPSY  COMPARISON: Previous exam(s).  FINDINGS: Mammographic images were  obtained following ultrasound guided biopsy of the left breast and axilla. A ribbon shaped clip is identified within the hyperdense mass in the upper inner quadrant of the left breast. A heart shaped clip is identified within a left axillary lymph node.  IMPRESSION: 1. Appropriate positioning of both post biopsy marking clips at the sites of biopsy in the upper inner left breast and left axilla. 2. Please note, the Q-shaped clip was not placed in the left axillary lymph node due to the patient's nickel allergy. A titanium heart shaped clip was placed instead.  Final Assessment: Post Procedure Mammograms for Marker Placement   Electronically Signed By: Kristopher Oppenheim M.D. On: 05/17/2020 Jo:03   Problem List/Past Medical Rodman Key K. 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  Diagnostic Studies History (Chanel Teressa Senter, CMA; 05/28/2020 11:18 AM) Colonoscopy 5-10 years ago Mammogram within last year Pap Smear 1-5 years ago  Allergies (Chanel Teressa Senter, CMA; 05/28/2020 11:21 AM) Amoxicillin *PENICILLINS* Pineapple Concentrate *PHARMACEUTICAL ADJUVANTS* Allergies Reconciled  Medication History (Chanel Teressa Senter, CMA; 05/28/2020 11:21 AM) amLODIPine Besy-Benazepril HCl (5-20MG Capsule, Oral) Active. Medications Reconciled  Pregnancy / Birth History Antonietta Jewel, CMA; 05/28/2020 11:18 AM) Age at menarche 81 years. Age of menopause >61 Gravida 0 Para 0 Regular periods  Other Problems Jo Mills. Mahmood Boehringer, MD; 05/28/2020 12:36 PM) Breast Cancer Diverticulosis Gastroesophageal Reflux Disease High blood pressure Lump In Breast     Review of Systems (Chanel Nolan CMA; 05/28/2020 11:18 AM) General Not Present- Appetite Loss, Chills, Fatigue, Fever, Night Sweats, Weight Gain and Weight  Loss. Skin Present- Dryness. Not Present- Change in Wart/Mole, Hives, Jaundice, New Lesions, Non-Healing Wounds, Rash and Ulcer. HEENT Present- Seasonal Allergies and Wears glasses/contact lenses. Not Present- Earache, Hearing Loss, Hoarseness, Nose Bleed, Oral Ulcers, Ringing in the Ears, Sinus Pain, Sore Throat, Visual Disturbances and Yellow Eyes. Respiratory Not Present- Bloody sputum, Chronic Cough, Difficulty Breathing, Snoring and Wheezing. Breast Present- Breast Mass. Not Present- Breast Pain, Nipple Discharge and Skin Changes. Cardiovascular Not Present- Chest Pain, Difficulty Breathing Lying Down, Leg Cramps, Palpitations, Rapid Heart Rate, Shortness of Breath and Swelling of Extremities. Gastrointestinal Not Present- Abdominal Pain, Bloating, Bloody Stool, Change in Bowel Habits, Chronic diarrhea, Constipation, Difficulty Swallowing, Excessive gas, Gets full quickly at meals, Hemorrhoids, Indigestion, Nausea, Rectal Pain and Vomiting. [place] Genitourinary Not Present- Frequency, Nocturia, Painful Urination, Pelvic Pain and Urgency. Musculoskeletal Not Present- Back Pain, Joint Pain, Joint Stiffness, Muscle Pain, Muscle Weakness and Swelling of Extremities. Neurological Not Present- Decreased Memory, Fainting, Headaches, Numbness, Seizures, Tingling, Tremor, Trouble walking and Weakness. Psychiatric Not Present- Anxiety, Bipolar, Change in Sleep Pattern, Depression, Fearful and Frequent crying. Endocrine Present- Hot flashes. Not Present- Cold Intolerance, Excessive Hunger, Hair Changes, Heat Intolerance and New Diabetes. Hematology Not Present- Blood Thinners, Easy Bruising, Excessive bleeding, Gland problems, HIV and Persistent Infections.  Vitals (Chanel Nolan CMA; 05/28/2020 11:21 AM) 05/28/2020 11:21 AM Weight: 169 lb Height: 66in Body Surface Area: 1.86 m Body Mass Index: 27.Jo kg/m  Temp.: 93.89F  Pulse: 78 (Regular)  BP: 132/84(Sitting, Left Arm,  Standard)        Physical Exam Rodman Key K. Jannelly Bergren MD; 05/28/2020 12:40 PM)  The physical exam findings are as follows: Note:Constitutional: WDWN in NAD, conversant, no obvious deformities; resting comfortably Eyes: Pupils equal,  round; sclera anicteric; moist conjunctiva; no lid lag HENT: Oral mucosa moist; good dentition Neck: No masses palpated, trachea midline; no thyromegaly Lungs: CTA bilaterally; normal respiratory effort Breasts: symmetric; no nipple changes or discharge; no palpable lymphadenopathy in either axilla or [place] area; no right breast masses; left breast at 10:30 at edge of breast - firm irregular palpable mass about 3 cm across. No overlying skin changes CV: Regular rate and rhythm; no murmurs; extremities well-perfused with no edema Abd: +bowel sounds, soft, non-tender, no palpable organomegaly; no palpable hernias Musc: Normal gait; no apparent clubbing or cyanosis in extremities Lymphatic: No palpable cervical or axillary lymphadenopathy Skin: Warm, dry; no sign of jaundice Psychiatric - alert and oriented x 4; calm mood and affect    Assessment & Plan Rodman Key K. Kiren Mcisaac MD; 05/28/2020 12:43 PM)  INVASIVE DUCTAL CARCINOMA OF LEFT BREAST, STAGE 1 (C50.912)   BREAST NEOPLASM, TIS (DCIS), LEFT (D05.12)  Current Plans Schedule for Surgery - Ultrasound-guided port placement. The surgical procedure has been discussed with the patient. Potential risks, benefits, alternative treatments, and expected outcomes have been explained. All of the patient's questions at this time have been answered. The likelihood of reaching the patient's treatment goal is good. The patient understand the proposed surgical procedure and wishes to proceed. MRI, BOTH BREASTS ([id number]) Referred to Oncology, for evaluation and follow up (Oncology). Routine. 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 Her case was also discussed  in breast conference earlier in the week. I spent approximately 45 minutes with the patient and her husband out of this 65 minute appointment. We discussed her disease and the difference between DCIS and invasive ductal carcinoma. Due to the large size of the primary tumor in the second area of DCIS in the same quadrant, the recommendation from breast conference was to pursue bilateral breast MRI followed by neoadjuvant chemotherapy. This will increase the likelihood of a satisfactory outcome from breast conservation in the future. The patient is strongly in agreement with this plan. Based on her research, she had felt that this would be the best treatment plan for her cancers. She is quite happy that we are all in agreement. We will expedite her referral to medical oncology. She has also been referred to radiation oncology but this is not an urgent priority. An MRI has been ordered. We discussed port placement for neoadjuvant chemotherapy. We will try to get that scheduled in the next 2-3 weeks.History of Present Illness Jo Mills. Skylin Kennerson MD; 05/28/2020 12:36 PM) The patient is a Jo Mills who presents with breast cancer. PCP - Valentina Shaggy NP Reason for eval - new diagnosis left breast IDC/ DCIS  This is a Jo Mills in good health who presents with a palpable mass in her left breast. The mass has been present for at least 5 months. She has not had a mammogram since 2013. Mammogram and ultrasound showed 2 areas in the left upper inner quadrant of concern. The palpable mass is 2.7 x 2.6 x 2.5 cm located at 08/14/09 centimeters from the nipple. 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 There was an area on the right side that was biopsied and revealed a  fibroadenoma. The patient is from [place] and  comes down to see [place] for her initial evaluation.    CLINICAL DATA: Palpable mass in the LEFT breast since the end of March.  EXAM: DIGITAL DIAGNOSTIC BILATERAL MAMMOGRAM WITH CAD AND TOMO  ULTRASOUND LEFT BREAST  COMPARISON: 05/01/2012 and 03/25/2010  ACR Breast Density Category a: The breast tissue is almost entirely fatty.  FINDINGS: RIGHT BREAST:  Mammogram: Within the UPPER central portion of the RIGHT breast there is a group of calcifications further evaluated with magnified views. These views demonstrate a 4 millimeter group of mildly heterogeneous calcifications associated with parenchymal asymmetry. Otherwise RIGHT breast is negative. Mammographic images were processed with CAD.  LEFT BREAST:  Mammogram: There is an oval mass with irregular margins in the UPPER INNER QUADRANT of the LEFT breast. In the LOWER INNER QUADRANT of the LEFT breast, anterior depth, a group of calcifications is further evaluated with magnified views. These views demonstrate a group of linear and punctate calcifications spanning 0.5 x 1.0 centimeters. Mammographic images were processed with CAD.  Physical Exam: I palpate a discrete mass in the [place] of the LEFT breast.  Ultrasound: Targeted ultrasound is performed, showing an oval mass with irregular margins in in the 10:30 o'clock location of the LEFT breast 10 centimeters from the nipple. Mass is 2.7 x 2.6 x 2.5 centimeters. Internal blood flow is present on Doppler evaluation.  Evaluation of the LEFT axilla shows a single lymph node with mildly thickened cortex of 4 millimeters. Other lymph nodes have normal morphology.  IMPRESSION: 1. Indeterminate 4 millimeter group of calcifications in the UPPER central RIGHT breast. 2. Suspicious mass in the 10:30 o'clock location of the LEFT breast. 3. Suspicious calcifications in the anterior LOWER INNER QUADRANT  of the LEFT breast. 4. Indeterminate single LEFT axillary lymph node.  RECOMMENDATION: 1. Recommend stereotactic guided core biopsy of calcifications in the UPPER central RIGHT breast in the anterior LOWER INNER QUADRANT of the LEFT breast. 2. Recommend ultrasound-guided core biopsy of mass in the 10:30 o'clock location of the LEFT breast and mildly prominent LEFT axillary lymph node. 3. Recommend biopsies be scheduled for 2 separate days, scheduled on 05/14/2020 and 05/17/2020.  I have discussed the findings and recommendations with the patient. If applicable, a reminder letter will be sent to the patient regarding the next appointment.  BI-RADS CATEGORY 5: Highly suggestive of malignancy.   Electronically Signed By: Nolon Nations M.D. On: 05/07/2020 13:18  CLINICAL DATA: Jo Mills status post ultrasound-guided biopsy of the left breast and axilla.  EXAM: DIAGNOSTIC LEFT MAMMOGRAM POST ULTRASOUND BIOPSY  COMPARISON: Previous exam(s).  FINDINGS: Mammographic images were obtained following ultrasound guided biopsy of the left breast and axilla. A ribbon shaped clip is identified within the hyperdense mass in the upper inner quadrant of the left breast. A heart shaped clip is identified within a left axillary lymph node.  IMPRESSION: 1. Appropriate positioning of both post biopsy marking clips at the sites of biopsy in the upper inner left breast and left axilla. 2. Please note, the Q-shaped clip was not placed in the left axillary lymph node due to the patient's nickel allergy. A titanium heart shaped clip was placed instead.  Final Assessment: Post Procedure Mammograms for Marker Placement   Electronically Signed By: Kristopher Oppenheim M.D. On: 05/17/2020 Jo:03   Problem List/Past Medical Rodman Key K. 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  Diagnostic Studies History (Chanel Teressa Senter, CMA; 05/28/2020 11:18 AM) Colonoscopy 5-10 years ago Mammogram within last year Pap Smear 1-5 years ago  Allergies (Chanel Teressa Senter, CMA; 05/28/2020 11:21 AM) Amoxicillin *PENICILLINS* Pineapple Concentrate *PHARMACEUTICAL ADJUVANTS* Allergies Reconciled  Medication History (Chanel Teressa Senter, CMA; 05/28/2020 11:21 AM) amLODIPine Besy-Benazepril HCl (5-20MG Capsule, Oral) Active. Medications Reconciled  Pregnancy / Birth History Antonietta Jewel, CMA; 05/28/2020 11:18 AM) Age at menarche 26 years. Age of menopause >70 Gravida 0 Para 0 Regular periods  Other Problems Jo [place]. Aly Hauser, MD; 05/28/2020 12:36 PM) Breast Cancer Diverticulosis Gastroesophageal Reflux Disease High blood pressure Lump In Breast     Review of Systems (Chanel Nolan CMA; 05/28/2020 11:18 AM) General Not Present- Appetite Loss, Chills, Fatigue, Fever, Night Sweats, Weight Gain and Weight Loss. Skin Present- Dryness. Not Present- Change in Wart/Mole, Hives, Jaundice, New Lesions, Non-Healing Wounds, Rash and Ulcer. HEENT Present- Seasonal Allergies and Wears glasses/contact lenses. Not Present- Earache, Hearing Loss, Hoarseness, Nose Bleed, Oral Ulcers, Ringing in the Ears, Sinus Pain, Sore Throat, Visual Disturbances and Yellow Eyes. Respiratory Not Present- Bloody sputum, Chronic Cough, Difficulty Breathing, Snoring and Wheezing. Breast Present- Breast Mass. Not Present- Breast Pain, Nipple Discharge and Skin Changes. Cardiovascular Not Present- Chest Pain, Difficulty Breathing Lying Down, Leg Cramps, Palpitations, Rapid Heart Rate, Shortness of Breath and Swelling of Extremities. Gastrointestinal Not Present- Abdominal Pain, Bloating, Bloody Stool, Change in Bowel Habits, Chronic diarrhea, Constipation, Difficulty Swallowing, Excessive gas, Gets full quickly at meals,  Hemorrhoids, Indigestion, Nausea, Rectal Pain and Vomiting. [place] Genitourinary Not Present- Frequency, Nocturia, Painful Urination, Pelvic Pain and Urgency. Musculoskeletal Not Present- Back Pain, Joint Pain, Joint Stiffness, Muscle Pain, Muscle Weakness and Swelling of Extremities. Neurological Not Present- Decreased Memory, Fainting, Headaches, Numbness, Seizures, Tingling, Tremor, Trouble walking and Weakness. Psychiatric Not Present- Anxiety, Bipolar, Change in Sleep Pattern, Depression, Fearful and Frequent crying. Endocrine Present- Hot flashes. Not Present- Cold Intolerance, Excessive Hunger, Hair Changes, Heat Intolerance and New Diabetes. Hematology Not Present- Blood Thinners, Easy Bruising, Excessive bleeding, Gland problems, HIV and Persistent Infections.  Vitals (Chanel Nolan CMA; 05/28/2020 11:21 AM) 05/28/2020 11:21 AM Weight: 169 lb Height: 66in Body Surface Area: 1.86 m Body Mass Index: 27.Jo kg/m  Temp.: 93.50F  Pulse: 78 (Regular)  BP: 132/84(Sitting, Left Arm, Standard)        Physical Exam Rodman Key K. Anel Creighton MD; 05/28/2020 12:40 PM)  The physical exam findings are as follows: Note:Constitutional: WDWN in NAD, conversant, no obvious deformities; resting comfortably Eyes: Pupils equal, round; sclera anicteric; moist conjunctiva; no lid lag HENT: Oral mucosa moist; good dentition Neck: No masses palpated, trachea midline; no thyromegaly Lungs: CTA bilaterally; normal respiratory effort Breasts: symmetric; no nipple changes or discharge; no palpable lymphadenopathy in either axilla or [place] area; no right breast masses; left breast at 10:30 at edge of breast - firm irregular palpable mass about 3 cm across. No overlying skin changes CV: Regular rate and rhythm; no murmurs; extremities well-perfused with no edema Abd: +bowel sounds, soft, non-tender, no palpable organomegaly; no palpable hernias Musc: Normal gait; no apparent clubbing or cyanosis in  extremities Lymphatic: No palpable cervical or axillary lymphadenopathy Skin: Warm, dry; no sign of jaundice Psychiatric - alert and oriented x 4; calm mood and affect    Assessment & Plan Rodman Key K. Elenore Wanninger MD; 05/28/2020 12:43 PM)  INVASIVE DUCTAL CARCINOMA OF LEFT BREAST, STAGE 1 (C50.912)   BREAST NEOPLASM, TIS (DCIS), LEFT (D05.12)  Current Plans Schedule for Surgery - Ultrasound-guided port placement. The surgical  procedure has been discussed with the patient. Potential risks, benefits, alternative treatments, and expected outcomes have been explained. All of the patient's questions at this time have been answered. The likelihood of reaching the patient's treatment goal is good. The patient understand the proposed surgical procedure and wishes to proceed. MRI, BOTH BREASTS ([id number]) Referred to Oncology, for evaluation and follow up (Oncology). Routine. 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 Her case was also discussed in breast conference earlier in the week. I spent approximately 45 minutes with the patient and her husband out of this 65 minute appointment. We discussed her disease and the difference between DCIS and invasive ductal carcinoma. Due to the large size of the primary tumor in the second area of DCIS in the same quadrant, the recommendation from breast conference was to pursue bilateral breast MRI followed by neoadjuvant chemotherapy. This will increase the likelihood of a satisfactory outcome from breast conservation in the future. The patient is strongly in agreement with this plan. Based on her research, she had felt that this would be the best treatment plan for her cancers. She is quite happy that we are all in agreement. We will expedite her referral to medical oncology. She has also been referred to radiation oncology but this is not an urgent priority. An MRI has been ordered. We  discussed port placement for neoadjuvant chemotherapy. We will try to get that scheduled in the next 2-3 weeks.History of Present Illness Jo Mills. Egon Dittus MD; 05/28/2020 12:36 PM) The patient is a Jo Mills who presents with breast cancer. PCP - Valentina Shaggy NP Reason for eval - new diagnosis left breast IDC/ DCIS  This is a Jo Mills in good health who presents with a palpable mass in her left breast. The mass has been present for at least 5 months. She has not had a mammogram since 2013. Mammogram and ultrasound showed 2 areas in the left upper inner quadrant of concern. The palpable mass is 2.7 x 2.6 x 2.5 cm located at 08/14/09 centimeters from the nipple. 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 There was an area on the right side that was biopsied and revealed a fibroadenoma. The patient is from [place] and comes down to see [place] for her initial evaluation.    CLINICAL DATA: Palpable mass in the LEFT breast since the end of March.  EXAM: DIGITAL DIAGNOSTIC BILATERAL MAMMOGRAM WITH CAD AND TOMO  ULTRASOUND LEFT BREAST  COMPARISON: 05/01/2012 and 03/25/2010  ACR Breast Density Category a: The breast tissue is almost entirely fatty.  FINDINGS: RIGHT BREAST:  Mammogram: Within the UPPER central portion of the RIGHT breast there is a group of calcifications further evaluated with magnified views. These views demonstrate a 4 millimeter group of mildly heterogeneous calcifications associated with parenchymal asymmetry. Otherwise RIGHT breast is negative. Mammographic images were processed with CAD.  LEFT BREAST:  Mammogram: There is an oval mass with irregular margins in the UPPER INNER QUADRANT of the LEFT breast. In the LOWER INNER  QUADRANT of the LEFT breast, anterior depth, a group of calcifications is further evaluated with magnified views. These views demonstrate a group of linear and punctate calcifications spanning 0.5 x 1.0 centimeters. Mammographic images were  processed with CAD.  Physical Exam: I palpate a discrete mass in the [place] of the LEFT breast.  Ultrasound: Targeted ultrasound is performed, showing an oval mass with irregular margins in in the 10:30 o'clock location of the LEFT breast 10 centimeters from the nipple. Mass is 2.7 x 2.6 x 2.5 centimeters. Internal blood flow is present on Doppler evaluation.  Evaluation of the LEFT axilla shows a single lymph node with mildly thickened cortex of 4 millimeters. Other lymph nodes have normal morphology.  IMPRESSION: 1. Indeterminate 4 millimeter group of calcifications in the UPPER central RIGHT breast. 2. Suspicious mass in the 10:30 o'clock location of the LEFT breast. 3. Suspicious calcifications in the anterior LOWER INNER QUADRANT of the LEFT breast. 4. Indeterminate single LEFT axillary lymph node.  RECOMMENDATION: 1. Recommend stereotactic guided core biopsy of calcifications in the UPPER central RIGHT breast in the anterior LOWER INNER QUADRANT of the LEFT breast. 2. Recommend ultrasound-guided core biopsy of mass in the 10:30 o'clock location of the LEFT breast and mildly prominent LEFT axillary lymph node. 3. Recommend biopsies be scheduled for 2 separate days, scheduled on 05/14/2020 and 05/17/2020.  I have discussed the findings and recommendations with the patient. If applicable, a reminder letter will be sent to the patient regarding the next appointment.  BI-RADS CATEGORY 5: Highly suggestive of malignancy.   Electronically Signed By: Nolon Nations M.D. On: 05/07/2020 13:18  CLINICAL DATA: Jo Mills status post ultrasound-guided biopsy of the left breast and axilla.  EXAM: DIAGNOSTIC  LEFT MAMMOGRAM POST ULTRASOUND BIOPSY  COMPARISON: Previous exam(s).  FINDINGS: Mammographic images were obtained following ultrasound guided biopsy of the left breast and axilla. A ribbon shaped clip is identified within the hyperdense mass in the upper inner quadrant of the left breast. A heart shaped clip is identified within a left axillary lymph node.  IMPRESSION: 1. Appropriate positioning of both post biopsy marking clips at the sites of biopsy in the upper inner left breast and left axilla. 2. Please note, the Q-shaped clip was not placed in the left axillary lymph node due to the patient's nickel allergy. A titanium heart shaped clip was placed instead.  Final Assessment: Post Procedure Mammograms for Marker Placement   Electronically Signed By: Kristopher Oppenheim M.D. On: 05/17/2020 Jo:03   Problem List/Past Medical Rodman Key K. 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  Diagnostic Studies History (Chanel Teressa Senter, CMA; 05/28/2020 11:18 AM) Colonoscopy 5-10 years ago Mammogram within last year Pap Smear 1-5 years ago  Allergies (Chanel Teressa Senter, CMA; 05/28/2020 11:21 AM) Amoxicillin *PENICILLINS* Pineapple Concentrate *PHARMACEUTICAL ADJUVANTS* Allergies Reconciled  Medication History (Chanel Teressa Senter, CMA; 05/28/2020 11:21 AM) amLODIPine Besy-Benazepril HCl (5-20MG Capsule, Oral) Active. Medications Reconciled  Pregnancy / Birth History Antonietta Jewel, CMA; 05/28/2020 11:18 AM) Age at menarche 77 years. Age of menopause >20 Gravida 0 Para 0 Regular periods  Other Problems Jo [place]. Aislynn Cifelli, MD; 05/28/2020 12:36 PM) Breast Cancer Diverticulosis Gastroesophageal Reflux Disease High blood pressure Lump In Breast     Review of Systems (Chanel Nolan CMA; 05/28/2020  11:18 AM) General Not Present- Appetite Loss, Chills, Fatigue, Fever, Night Sweats, Weight Gain and Weight Loss. Skin Present- Dryness. Not Present- Change in Wart/Mole, Hives, Jaundice, New Lesions, Non-Healing Wounds, Rash and Ulcer. HEENT Present- Seasonal Allergies and Wears glasses/contact lenses. Not Present- Earache, Hearing Loss, Hoarseness, Nose Bleed, Oral  Ulcers, Ringing in the Ears, Sinus Pain, Sore Throat, Visual Disturbances and Yellow Eyes. Respiratory Not Present- Bloody sputum, Chronic Cough, Difficulty Breathing, Snoring and Wheezing. Breast Present- Breast Mass. Not Present- Breast Pain, Nipple Discharge and Skin Changes. Cardiovascular Not Present- Chest Pain, Difficulty Breathing Lying Down, Leg Cramps, Palpitations, Rapid Heart Rate, Shortness of Breath and Swelling of Extremities. Gastrointestinal Not Present- Abdominal Pain, Bloating, Bloody Stool, Change in Bowel Habits, Chronic diarrhea, Constipation, Difficulty Swallowing, Excessive gas, Gets full quickly at meals, Hemorrhoids, Indigestion, Nausea, Rectal Pain and Vomiting. Mills Genitourinary Not Present- Frequency, Nocturia, Painful Urination, Pelvic Pain and Urgency. Musculoskeletal Not Present- Back Pain, Joint Pain, Joint Stiffness, Muscle Pain, Muscle Weakness and Swelling of Extremities. Neurological Not Present- Decreased Memory, Fainting, Headaches, Numbness, Seizures, Tingling, Tremor, Trouble walking and Weakness. Psychiatric Not Present- Anxiety, Bipolar, Change in Sleep Pattern, Depression, Fearful and Frequent crying. Endocrine Present- Hot flashes. Not Present- Cold Intolerance, Excessive Hunger, Hair Changes, Heat Intolerance and New Diabetes. Hematology Not Present- Blood Thinners, Easy Bruising, Excessive bleeding, Gland problems, HIV and Persistent Infections.  Vitals (Chanel Nolan CMA; 05/28/2020 11:21 AM) 05/28/2020 11:21 AM Weight: 169 lb Height: 66in Body Surface Area: 1.86 m Body Mass Index:  27.Jo kg/m  Temp.: 93.60F  Pulse: 78 (Regular)  BP: 132/84(Sitting, Left Arm, Standard)        Physical Exam Rodman Key K. Vianna Venezia MD; 05/28/2020 12:40 PM)  The physical exam findings are as follows: Note:Constitutional: WDWN in NAD, conversant, no obvious deformities; resting comfortably Eyes: Pupils equal, round; sclera anicteric; moist conjunctiva; no lid lag HENT: Oral mucosa moist; good dentition Neck: No masses palpated, trachea midline; no thyromegaly Lungs: CTA bilaterally; normal respiratory effort Breasts: symmetric; no nipple changes or discharge; no palpable lymphadenopathy in either axilla or [place] area; no right breast masses; left breast at 10:30 at edge of breast - firm irregular palpable mass about 3 cm across. No overlying skin changes CV: Regular rate and rhythm; no murmurs; extremities well-perfused with no edema Abd: +bowel sounds, soft, non-tender, no palpable organomegaly; no palpable hernias Musc: Normal gait; no apparent clubbing or cyanosis in extremities Lymphatic: No palpable cervical or axillary lymphadenopathy Skin: Warm, dry; no sign of jaundice Psychiatric - alert and oriented x 4; calm mood and affect    Assessment & Plan Rodman Key K. Adalay Azucena MD; 05/28/2020 12:43 PM)  INVASIVE DUCTAL CARCINOMA OF LEFT BREAST, STAGE 1 (C50.912)   BREAST NEOPLASM, TIS (DCIS), LEFT (D05.12)  Current Plans Schedule for Surgery - Ultrasound-guided port placement. The surgical procedure has been discussed with the patient. Potential risks, benefits, alternative treatments, and expected outcomes have been explained. All of the patient's questions at this time have been answered. The likelihood of reaching the patient's treatment goal is good. The patient understand the proposed surgical procedure and wishes to proceed. MRI, BOTH BREASTS ([id number]) Referred to Oncology, for evaluation and follow up (Oncology). Routine. 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 Her case was also discussed in breast conference earlier in the week. I spent approximately 45 minutes with the patient and her husband out of this 65 minute appointment. We discussed her disease and the difference between DCIS and invasive ductal carcinoma. Due to the large size of the primary tumor in the second area of DCIS in the same quadrant, the recommendation from breast conference was to pursue bilateral breast MRI followed by neoadjuvant chemotherapy. This will increase the likelihood of a satisfactory outcome from breast  conservation in the future. The patient is strongly in agreement with this plan. Based on her research, she had felt that this would be the best treatment plan for her cancers. She is quite happy that we are all in agreement. We will expedite her referral to medical oncology. She has also been referred to radiation oncology but this is not an urgent priority. An MRI has been ordered. We discussed port placement for neoadjuvant chemotherapy. We will try to get that scheduled in the next 2-3 weeks.History of Present Illness [place]. Si Jachim MD; 05/28/2020 12:36 PM) The patient is a Jo Mills who presents with breast cancer. PCP - Valentina Shaggy NP Reason for eval - new diagnosis left breast IDC/ DCIS  This is a Jo Mills in good health who presents with a palpable mass in her left breast. The mass has been present for at least 5 months. She has not had a mammogram since 2013. Mammogram and ultrasound showed 2 areas in the left upper inner quadrant of concern. The palpable mass is 2.7 x 2.6 x 2.5 cm located at 08/14/09 centimeters from the nipple. 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  There is a 1 cm area of calcifications also in the left upper inner quadrant was biopsied and revealed high-grade DCIS with calcifications and necrosis. ER/PR  negative. There was an area on the right side that was biopsied and revealed a fibroadenoma. The patient is from [place] and comes down to see [place] for her initial evaluation.    CLINICAL DATA: Palpable mass in the LEFT breast since the end of March.  EXAM: DIGITAL DIAGNOSTIC BILATERAL MAMMOGRAM WITH CAD AND TOMO  ULTRASOUND LEFT BREAST  COMPARISON: 05/01/2012 and 03/25/2010  ACR Breast Density Category a: The breast tissue is almost entirely fatty.  FINDINGS: RIGHT BREAST:  Mammogram: Within the UPPER central portion of the RIGHT breast there is a group of calcifications further evaluated with magnified views. These views demonstrate a 4 millimeter group of mildly heterogeneous calcifications associated with parenchymal asymmetry. Otherwise RIGHT breast is negative. Mammographic images were processed with CAD.  LEFT BREAST:  Mammogram: There is an oval mass with irregular margins in the UPPER INNER QUADRANT of the LEFT breast. In the LOWER INNER QUADRANT of the LEFT breast, anterior depth, a group of calcifications is further evaluated with magnified views. These views demonstrate a group of linear and punctate calcifications spanning 0.5 x 1.0 centimeters. Mammographic images were processed with CAD.  Physical Exam: I palpate a discrete mass in the [place] of the LEFT breast.  Ultrasound: Targeted ultrasound is performed, showing an oval mass with irregular margins in in the 10:30 o'clock location of the LEFT breast 10 centimeters from the nipple. Mass is 2.7 x 2.6 x 2.5 centimeters. Internal blood flow is present on Doppler evaluation.  Evaluation of the LEFT axilla shows a single lymph node with mildly thickened cortex of 4 millimeters. Other lymph nodes have normal morphology.  IMPRESSION: 1. Indeterminate 4 millimeter group of calcifications in the UPPER central RIGHT breast. 2. Suspicious mass in the 10:30 o'clock location of the LEFT  breast. 3. Suspicious calcifications in the anterior LOWER INNER QUADRANT of the LEFT breast. 4. Indeterminate single LEFT axillary lymph node.  RECOMMENDATION: 1. Recommend stereotactic guided core biopsy of calcifications in the UPPER central RIGHT breast in the anterior LOWER INNER QUADRANT of the LEFT breast. 2. Recommend ultrasound-guided core biopsy of mass in the  10:30 o'clock location of the LEFT breast and mildly prominent LEFT axillary lymph node. 3. Recommend biopsies be scheduled for 2 separate days, scheduled on 05/14/2020 and 05/17/2020.  I have discussed the findings and recommendations with the patient. If applicable, a reminder letter will be sent to the patient regarding the next appointment.  BI-RADS CATEGORY 5: Highly suggestive of malignancy.   Electronically Signed By: Nolon Nations M.D. On: 05/07/2020 13:18  CLINICAL DATA: Jo [place] status post ultrasound-guided biopsy of the left breast and axilla.  EXAM: DIAGNOSTIC LEFT MAMMOGRAM POST ULTRASOUND BIOPSY  COMPARISON: Previous exam(s).  FINDINGS: Mammographic images were obtained following ultrasound guided biopsy of the left breast and axilla. A ribbon shaped clip is identified within the hyperdense mass in the upper inner quadrant of the left breast. A heart shaped clip is identified within a left axillary lymph node.  IMPRESSION: 1. Appropriate positioning of both post biopsy marking clips at the sites of biopsy in the upper inner left breast and left axilla. 2. Please note, the Q-shaped clip was not placed in the left axillary lymph node due to the patient's nickel allergy. A titanium heart shaped clip was placed instead.  Final Assessment: Post Procedure Mammograms for Marker Placement   Electronically Signed By: Kristopher Oppenheim M.D. On: 05/17/2020 Jo:03   Problem List/Past Medical Rodman Key K. Malala Trenkamp, MD; 05/28/2020 12:36 PM) BREAST NEOPLASM, TIS (DCIS), LEFT  (D05.12) INVASIVE DUCTAL CARCINOMA OF LEFT BREAST, STAGE 1 (A21.308)  Past Surgical History (Chanel Teressa Senter, CMA; 05/28/2020 11:18 AM) Breast Biopsy Bilateral.  Diagnostic Studies History (Chanel Teressa Senter, CMA; 05/28/2020 11:18 AM) Colonoscopy 5-10 years ago Mammogram within last year Pap Smear 1-5 years ago  Allergies (Chanel Teressa Senter, CMA; 05/28/2020 11:21 AM) Amoxicillin *PENICILLINS* Pineapple Concentrate *PHARMACEUTICAL ADJUVANTS* Allergies Reconciled  Medication History (Chanel Teressa Senter, CMA; 05/28/2020 11:21 AM) amLODIPine Besy-Benazepril HCl (5-20MG Capsule, Oral) Active. Medications Reconciled  Pregnancy / Birth History Antonietta Jewel, CMA; 05/28/2020 11:18 AM) Age at menarche 76 years. Age of menopause >Jo Gravida 0 Para 0 Regular periods  Other Problems [place]. Yamilex Borgwardt, MD; 05/28/2020 12:36 PM) Breast Cancer Diverticulosis Gastroesophageal Reflux Disease High blood pressure Lump In Breast     Review of Systems (Chanel Nolan CMA; 05/28/2020 11:18 AM) General Not Present- Appetite Loss, Chills, Fatigue, Fever, Night Sweats, Weight Gain and Weight Loss. Skin Present- Dryness. Not Present- Change in Wart/Mole, Hives, Jaundice, New Lesions, Non-Healing Wounds, Rash and Ulcer. HEENT Present- Seasonal Allergies and Wears glasses/contact lenses. Not Present- Earache, Hearing Loss, Hoarseness, Nose Bleed, Oral Ulcers, Ringing in the Ears, Sinus Pain, Sore Throat, Visual Disturbances and Yellow Eyes. Respiratory Not Present- Bloody sputum, Chronic Cough, Difficulty Breathing, Snoring and Wheezing. Breast Present- Breast Mass. Not Present- Breast Pain, Nipple Discharge and Skin Changes. Cardiovascular Not Present- Chest Pain, Difficulty Breathing Lying Down, Leg Cramps, Palpitations, Rapid Heart Rate, Shortness of Breath and Swelling of Extremities. Gastrointestinal Not Present- Abdominal Pain, Bloating, Bloody Stool, Change in Bowel Habits, Chronic diarrhea,  Constipation, Difficulty Swallowing, Excessive gas, Gets full quickly at meals, Hemorrhoids, Indigestion, Nausea, Rectal Pain and Vomiting. [place] Genitourinary Not Present- Frequency, Nocturia, Painful Urination, Pelvic Pain and Urgency. Musculoskeletal Not Present- Back Pain, Joint Pain, Joint Stiffness, Muscle Pain, Muscle Weakness and Swelling of Extremities. Neurological Not Present- Decreased Memory, Fainting, Headaches, Numbness, Seizures, Tingling, Tremor, Trouble walking and Weakness. Psychiatric Not Present- Anxiety, Bipolar, Change in Sleep Pattern, Depression, Fearful and Frequent crying. Endocrine Present- Hot flashes. Not Present- Cold Intolerance, Excessive Hunger, Hair Changes, Heat Intolerance and New Diabetes. Hematology Not Present-  Blood Thinners, Easy Bruising, Excessive bleeding, Gland problems, HIV and Persistent Infections.  Vitals (Chanel Nolan CMA; 05/28/2020 11:21 AM) 05/28/2020 11:21 AM Weight: 169 lb Height: 66in Body Surface Area: 1.86 m Body Mass Index: 27.Jo kg/m  Temp.: 93.29F  Pulse: 78 (Regular)  BP: 132/84(Sitting, Left Arm, Standard)        Physical Exam Rodman Key K. Yaqueline Gutter MD; 05/28/2020 12:40 PM)  The physical exam findings are as follows: Note:Constitutional: WDWN in NAD, conversant, no obvious deformities; resting comfortably Eyes: Pupils equal, round; sclera anicteric; moist conjunctiva; no lid lag HENT: Oral mucosa moist; good dentition Neck: No masses palpated, trachea midline; no thyromegaly Lungs: CTA bilaterally; normal respiratory effort Breasts: symmetric; no nipple changes or discharge; no palpable lymphadenopathy in either axilla or [place] area; no right breast masses; left breast at 10:30 at edge of breast - firm irregular palpable mass about 3 cm across. No overlying skin changes CV: Regular rate and rhythm; no murmurs; extremities well-perfused with no edema Abd: +bowel sounds, soft, non-tender, no palpable organomegaly; no  palpable hernias Musc: Normal gait; no apparent clubbing or cyanosis in extremities Lymphatic: No palpable cervical or axillary lymphadenopathy Skin: Warm, dry; no sign of jaundice Psychiatric - alert and oriented x 4; calm mood and affect    Assessment & Plan Rodman Key K. Chaka Boyson MD; 05/28/2020 12:43 PM)  INVASIVE DUCTAL CARCINOMA OF LEFT BREAST, STAGE 1 (C50.912)   BREAST NEOPLASM, TIS (DCIS), LEFT (D05.12)  Current Plans Schedule for Surgery - Ultrasound-guided port placement. The surgical procedure has been discussed with the patient. Potential risks, benefits, alternative treatments, and expected outcomes have been explained. All of the patient's questions at this time have been answered. The likelihood of reaching the patient's treatment goal is good. The patient understand the proposed surgical procedure and wishes to proceed. MRI, BOTH BREASTS ([id number]) Referred to Oncology, for evaluation and follow up (Oncology). Routine. 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 Her case was also discussed in breast conference earlier in the week. I spent approximately 45 minutes with the patient and her husband out of this 65 minute appointment. We discussed her disease and the difference between DCIS and invasive ductal carcinoma. Due to the large size of the primary tumor in the second area of DCIS in the same quadrant, the recommendation from breast conference was to pursue bilateral breast MRI followed by neoadjuvant chemotherapy. This will increase the likelihood of a satisfactory outcome from breast conservation in the future. The patient is strongly in agreement with this plan. Based on her research, she had felt that this would be the best treatment plan for her cancers. She is quite happy that we are all in agreement. We will expedite her referral to medical oncology. She has also been referred to radiation oncology  but this is not an urgent priority. An MRI has been ordered. We discussed port placement for neoadjuvant chemotherapy. We will try to get that scheduled in the next 2-3 weeks.  [place]. Georgette Dover, MD, [facility] Surgery  General/ Trauma Surgery   05/28/2020 12:44 PM

## 2020-05-28 NOTE — H&P (View-Only) (Signed)
History of Present Illness Jo Mills. Jo Yono MD; 05/28/2020 12:36 PM) The patient is a 69 year old female who presents with breast cancer. PCP - Valentina Shaggy NP Reason for eval - new diagnosis left breast IDC/ DCIS  This is a 69 year old female in good health who presents with a palpable mass in her left breast. The mass has been present for at least 5 months. She has not had a mammogram since 2013. Mammogram and ultrasound showed 2 areas in the left upper inner quadrant of concern. The palpable mass is 2.7 x 2.6 x 2.5 cm located at 08/14/09 centimeters from the nipple. 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 There was an area on the right side that was biopsied and revealed a fibroadenoma. The patient is from [place] and comes down to see [place] for her initial evaluation.    CLINICAL DATA: Palpable mass in the LEFT breast since the end of March.  EXAM: DIGITAL DIAGNOSTIC BILATERAL MAMMOGRAM WITH CAD AND TOMO  ULTRASOUND LEFT BREAST  COMPARISON: 05/01/2012 and 03/25/2010  ACR Breast Density Category a: The breast tissue is almost entirely fatty.  FINDINGS: RIGHT BREAST:  Mammogram: Within the UPPER central portion of the RIGHT breast there is a group of calcifications further evaluated with magnified views. These views demonstrate a 4 millimeter group of mildly heterogeneous calcifications associated with parenchymal asymmetry. Otherwise RIGHT breast is negative. Mammographic images were processed with CAD.  LEFT BREAST:  Mammogram: There is an oval mass with irregular margins in the UPPER INNER QUADRANT of the LEFT breast. In the LOWER INNER QUADRANT of the LEFT breast, anterior depth, a group of calcifications is further evaluated with magnified  views. These views demonstrate a group of linear and punctate calcifications spanning 0.5 x 1.0 centimeters. Mammographic images were processed with CAD.  Physical Exam: I palpate a discrete mass in the [place] of the LEFT breast.  Ultrasound: Targeted ultrasound is performed, showing an oval mass with irregular margins in in the 10:30 o'clock location of the LEFT breast 10 centimeters from the nipple. Mass is 2.7 x 2.6 x 2.5 centimeters. Internal blood flow is present on Doppler evaluation.  Evaluation of the LEFT axilla shows a single lymph node with mildly thickened cortex of 4 millimeters. Other lymph nodes have normal morphology.  IMPRESSION: 1. Indeterminate 4 millimeter group of calcifications in the UPPER central RIGHT breast. 2. Suspicious mass in the 10:30 o'clock location of the LEFT breast. 3. Suspicious calcifications in the anterior LOWER INNER QUADRANT of the LEFT breast. 4. Indeterminate single LEFT axillary lymph node.  RECOMMENDATION: 1. Recommend stereotactic guided core biopsy of calcifications in the UPPER central RIGHT breast in the anterior LOWER INNER QUADRANT of the LEFT breast. 2. Recommend ultrasound-guided core biopsy of mass in the 10:30 o'clock location of the LEFT breast and mildly prominent LEFT axillary lymph node. 3. Recommend biopsies be scheduled for 2 separate days, scheduled on 05/14/2020 and 05/17/2020.  I have discussed the findings and recommendations with the patient. If applicable, a reminder letter will be sent to the patient regarding the next appointment.  BI-RADS CATEGORY 5: Highly suggestive of malignancy.   Electronically Signed By: Nolon Nations M.D. On: 05/07/2020 13:18  CLINICAL DATA: 69 year old female status post ultrasound-guided biopsy of the left breast and axilla.  EXAM: DIAGNOSTIC LEFT MAMMOGRAM POST ULTRASOUND BIOPSY  COMPARISON: Previous exam(s).  FINDINGS: Mammographic images were  obtained following ultrasound guided biopsy of the left breast and axilla. A ribbon shaped clip is identified within the hyperdense mass in the upper inner quadrant of the left breast. A heart shaped clip is identified within a left axillary lymph node.  IMPRESSION: 1. Appropriate positioning of both post biopsy marking clips at the sites of biopsy in the upper inner left breast and left axilla. 2. Please note, the Q-shaped clip was not placed in the left axillary lymph node due to the patient's nickel allergy. A titanium heart shaped clip was placed instead.  Final Assessment: Post Procedure Mammograms for Marker Placement   Electronically Signed By: Kristopher Oppenheim M.D. On: 05/17/2020 16:03   Problem List/Past Medical Rodman Key K. 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  Diagnostic Studies History (Chanel Teressa Senter, CMA; 05/28/2020 11:18 AM) Colonoscopy 5-10 years ago Mammogram within last year Pap Smear 1-5 years ago  Allergies (Chanel Teressa Senter, CMA; 05/28/2020 11:21 AM) Amoxicillin *PENICILLINS* Pineapple Concentrate *PHARMACEUTICAL ADJUVANTS* Allergies Reconciled  Medication History (Chanel Teressa Senter, CMA; 05/28/2020 11:21 AM) amLODIPine Besy-Benazepril HCl (5-'20MG'$  Capsule, Oral) Active. Medications Reconciled  Pregnancy / Birth History Jo Mills, CMA; 05/28/2020 11:18 AM) Age at menarche 74 years. Age of menopause >4 Gravida 0 Para 0 Regular periods  Other Problems Jo Mills. Jo Flight, MD; 05/28/2020 12:36 PM) Breast Cancer Diverticulosis Gastroesophageal Reflux Disease High blood pressure Lump In Breast     Review of Systems (Chanel Nolan CMA; 05/28/2020 11:18 AM) General Not Present- Appetite Loss, Chills, Fatigue, Fever, Night Sweats, Weight Gain and Weight  Loss. Skin Present- Dryness. Not Present- Change in Wart/Mole, Hives, Jaundice, New Lesions, Non-Healing Wounds, Rash and Ulcer. HEENT Present- Seasonal Allergies and Wears glasses/contact lenses. Not Present- Earache, Hearing Loss, Hoarseness, Nose Bleed, Oral Ulcers, Ringing in the Ears, Sinus Pain, Sore Throat, Visual Disturbances and Yellow Eyes. Respiratory Not Present- Bloody sputum, Chronic Cough, Difficulty Breathing, Snoring and Wheezing. Breast Present- Breast Mass. Not Present- Breast Pain, Nipple Discharge and Skin Changes. Cardiovascular Not Present- Chest Pain, Difficulty Breathing Lying Down, Leg Cramps, Palpitations, Rapid Heart Rate, Shortness of Breath and Swelling of Extremities. Gastrointestinal Not Present- Abdominal Pain, Bloating, Bloody Stool, Change in Bowel Habits, Chronic diarrhea, Constipation, Difficulty Swallowing, Excessive gas, Gets full quickly at meals, Hemorrhoids, Indigestion, Nausea, Rectal Pain and Vomiting. Female Genitourinary Not Present- Frequency, Nocturia, Painful Urination, Pelvic Pain and Urgency. Musculoskeletal Not Present- Back Pain, Joint Pain, Joint Stiffness, Muscle Pain, Muscle Weakness and Swelling of Extremities. Neurological Not Present- Decreased Memory, Fainting, Headaches, Numbness, Seizures, Tingling, Tremor, Trouble walking and Weakness. Psychiatric Not Present- Anxiety, Bipolar, Change in Sleep Pattern, Depression, Fearful and Frequent crying. Endocrine Present- Hot flashes. Not Present- Cold Intolerance, Excessive Hunger, Hair Changes, Heat Intolerance and New Diabetes. Hematology Not Present- Blood Thinners, Easy Bruising, Excessive bleeding, Gland problems, HIV and Persistent Infections.  Vitals (Chanel Nolan CMA; 05/28/2020 11:21 AM) 05/28/2020 11:21 AM Weight: 169 lb Height: 66in Body Surface Area: 1.86 m Body Mass Index: 27.28 kg/m  Temp.: 93.35F  Pulse: 78 (Regular)  BP: 132/84(Sitting, Left Arm,  Standard)        Physical Exam Rodman Key K. Ryann Pauli MD; 05/28/2020 12:40 PM)  The physical exam findings are as follows: Note:Constitutional: WDWN in NAD, conversant, no obvious deformities; resting comfortably Eyes: Pupils equal,  round; sclera anicteric; moist conjunctiva; no lid lag HENT: Oral mucosa moist; good dentition Neck: No masses palpated, trachea midline; no thyromegaly Lungs: CTA bilaterally; normal respiratory effort Breasts: symmetric; no nipple changes or discharge; no palpable lymphadenopathy in either axilla or [place] area; no right breast masses; left breast at 10:30 at edge of breast - firm irregular palpable mass about 3 cm across. No overlying skin changes CV: Regular rate and rhythm; no murmurs; extremities well-perfused with no edema Abd: +bowel sounds, soft, non-tender, no palpable organomegaly; no palpable hernias Musc: Normal gait; no apparent clubbing or cyanosis in extremities Lymphatic: No palpable cervical or axillary lymphadenopathy Skin: Warm, dry; no sign of jaundice Psychiatric - alert and oriented x 4; calm mood and affect    Assessment & Plan Rodman Key K. Mohid Furuya MD; 05/28/2020 12:43 PM)  INVASIVE DUCTAL CARCINOMA OF LEFT BREAST, STAGE 1 (C50.912)   BREAST NEOPLASM, TIS (DCIS), LEFT (D05.12)  Current Plans Schedule for Surgery - Ultrasound-guided port placement. The surgical procedure has been discussed with the patient. Potential risks, benefits, alternative treatments, and expected outcomes have been explained. All of the patient's questions at this time have been answered. The likelihood of reaching the patient's treatment goal is good. The patient understand the proposed surgical procedure and wishes to proceed. MRI, BOTH BREASTS ([id number]) Referred to Oncology, for evaluation and follow up (Oncology). Routine. 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 Her case was also discussed  in breast conference earlier in the week. I spent approximately 45 minutes with the patient and her husband out of this 65 minute appointment. We discussed her disease and the difference between DCIS and invasive ductal carcinoma. Due to the large size of the primary tumor in the second area of DCIS in the same quadrant, the recommendation from breast conference was to pursue bilateral breast MRI followed by neoadjuvant chemotherapy. This will increase the likelihood of a satisfactory outcome from breast conservation in the future. The patient is strongly in agreement with this plan. Based on her research, she had felt that this would be the best treatment plan for her cancers. She is quite happy that we are all in agreement. We will expedite her referral to medical oncology. She has also been referred to radiation oncology but this is not an urgent priority. An MRI has been ordered. We discussed port placement for neoadjuvant chemotherapy. We will try to get that scheduled in the next 2-3 weeks.History of Present Illness Jo Mills. Bona Hubbard MD; 05/28/2020 12:36 PM) The patient is a 69 year old female who presents with breast cancer. PCP - Valentina Shaggy NP Reason for eval - new diagnosis left breast IDC/ DCIS  This is a 69 year old female in good health who presents with a palpable mass in her left breast. The mass has been present for at least 5 months. She has not had a mammogram since 2013. Mammogram and ultrasound showed 2 areas in the left upper inner quadrant of concern. The palpable mass is 2.7 x 2.6 x 2.5 cm located at 08/14/09 centimeters from the nipple. 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 There was an area on the right side that was biopsied and revealed a  fibroadenoma. The patient is from [place] and  comes down to see [place] for her initial evaluation.    CLINICAL DATA: Palpable mass in the LEFT breast since the end of March.  EXAM: DIGITAL DIAGNOSTIC BILATERAL MAMMOGRAM WITH CAD AND TOMO  ULTRASOUND LEFT BREAST  COMPARISON: 05/01/2012 and 03/25/2010  ACR Breast Density Category a: The breast tissue is almost entirely fatty.  FINDINGS: RIGHT BREAST:  Mammogram: Within the UPPER central portion of the RIGHT breast there is a group of calcifications further evaluated with magnified views. These views demonstrate a 4 millimeter group of mildly heterogeneous calcifications associated with parenchymal asymmetry. Otherwise RIGHT breast is negative. Mammographic images were processed with CAD.  LEFT BREAST:  Mammogram: There is an oval mass with irregular margins in the UPPER INNER QUADRANT of the LEFT breast. In the LOWER INNER QUADRANT of the LEFT breast, anterior depth, a group of calcifications is further evaluated with magnified views. These views demonstrate a group of linear and punctate calcifications spanning 0.5 x 1.0 centimeters. Mammographic images were processed with CAD.  Physical Exam: I palpate a discrete mass in the [place] of the LEFT breast.  Ultrasound: Targeted ultrasound is performed, showing an oval mass with irregular margins in in the 10:30 o'clock location of the LEFT breast 10 centimeters from the nipple. Mass is 2.7 x 2.6 x 2.5 centimeters. Internal blood flow is present on Doppler evaluation.  Evaluation of the LEFT axilla shows a single lymph node with mildly thickened cortex of 4 millimeters. Other lymph nodes have normal morphology.  IMPRESSION: 1. Indeterminate 4 millimeter group of calcifications in the UPPER central RIGHT breast. 2. Suspicious mass in the 10:30 o'clock location of the LEFT breast. 3. Suspicious calcifications in the anterior LOWER INNER QUADRANT  of the LEFT breast. 4. Indeterminate single LEFT axillary lymph node.  RECOMMENDATION: 1. Recommend stereotactic guided core biopsy of calcifications in the UPPER central RIGHT breast in the anterior LOWER INNER QUADRANT of the LEFT breast. 2. Recommend ultrasound-guided core biopsy of mass in the 10:30 o'clock location of the LEFT breast and mildly prominent LEFT axillary lymph node. 3. Recommend biopsies be scheduled for 2 separate days, scheduled on 05/14/2020 and 05/17/2020.  I have discussed the findings and recommendations with the patient. If applicable, a reminder letter will be sent to the patient regarding the next appointment.  BI-RADS CATEGORY 5: Highly suggestive of malignancy.   Electronically Signed By: Nolon Nations M.D. On: 05/07/2020 13:18  CLINICAL DATA: 69 year old female status post ultrasound-guided biopsy of the left breast and axilla.  EXAM: DIAGNOSTIC LEFT MAMMOGRAM POST ULTRASOUND BIOPSY  COMPARISON: Previous exam(s).  FINDINGS: Mammographic images were obtained following ultrasound guided biopsy of the left breast and axilla. A ribbon shaped clip is identified within the hyperdense mass in the upper inner quadrant of the left breast. A heart shaped clip is identified within a left axillary lymph node.  IMPRESSION: 1. Appropriate positioning of both post biopsy marking clips at the sites of biopsy in the upper inner left breast and left axilla. 2. Please note, the Q-shaped clip was not placed in the left axillary lymph node due to the patient's nickel allergy. A titanium heart shaped clip was placed instead.  Final Assessment: Post Procedure Mammograms for Marker Placement   Electronically Signed By: Kristopher Oppenheim M.D. On: 05/17/2020 16:03   Problem List/Past Medical Rodman Key K. 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  Diagnostic Studies History (Chanel Teressa Senter, CMA; 05/28/2020 11:18 AM) Colonoscopy 5-10 years ago Mammogram within last year Pap Smear 1-5 years ago  Allergies (Chanel Teressa Senter, CMA; 05/28/2020 11:21 AM) Amoxicillin *PENICILLINS* Pineapple Concentrate *PHARMACEUTICAL ADJUVANTS* Allergies Reconciled  Medication History (Chanel Teressa Senter, CMA; 05/28/2020 11:21 AM) amLODIPine Besy-Benazepril HCl (5-'20MG'$  Capsule, Oral) Active. Medications Reconciled  Pregnancy / Birth History Jo Mills, CMA; 05/28/2020 11:18 AM) Age at menarche 53 years. Age of menopause >10 Gravida 0 Para 0 Regular periods  Other Problems Jo Mills. Faelynn Wynder, MD; 05/28/2020 12:36 PM) Breast Cancer Diverticulosis Gastroesophageal Reflux Disease High blood pressure Lump In Breast     Review of Systems (Chanel Nolan CMA; 05/28/2020 11:18 AM) General Not Present- Appetite Loss, Chills, Fatigue, Fever, Night Sweats, Weight Gain and Weight Loss. Skin Present- Dryness. Not Present- Change in Wart/Mole, Hives, Jaundice, New Lesions, Non-Healing Wounds, Rash and Ulcer. HEENT Present- Seasonal Allergies and Wears glasses/contact lenses. Not Present- Earache, Hearing Loss, Hoarseness, Nose Bleed, Oral Ulcers, Ringing in the Ears, Sinus Pain, Sore Throat, Visual Disturbances and Yellow Eyes. Respiratory Not Present- Bloody sputum, Chronic Cough, Difficulty Breathing, Snoring and Wheezing. Breast Present- Breast Mass. Not Present- Breast Pain, Nipple Discharge and Skin Changes. Cardiovascular Not Present- Chest Pain, Difficulty Breathing Lying Down, Leg Cramps, Palpitations, Rapid Heart Rate, Shortness of Breath and Swelling of Extremities. Gastrointestinal Not Present- Abdominal Pain, Bloating, Bloody Stool, Change in Bowel Habits, Chronic diarrhea, Constipation, Difficulty Swallowing, Excessive gas, Gets full quickly at meals,  Hemorrhoids, Indigestion, Nausea, Rectal Pain and Vomiting. Female Genitourinary Not Present- Frequency, Nocturia, Painful Urination, Pelvic Pain and Urgency. Musculoskeletal Not Present- Back Pain, Joint Pain, Joint Stiffness, Muscle Pain, Muscle Weakness and Swelling of Extremities. Neurological Not Present- Decreased Memory, Fainting, Headaches, Numbness, Seizures, Tingling, Tremor, Trouble walking and Weakness. Psychiatric Not Present- Anxiety, Bipolar, Change in Sleep Pattern, Depression, Fearful and Frequent crying. Endocrine Present- Hot flashes. Not Present- Cold Intolerance, Excessive Hunger, Hair Changes, Heat Intolerance and New Diabetes. Hematology Not Present- Blood Thinners, Easy Bruising, Excessive bleeding, Gland problems, HIV and Persistent Infections.  Vitals (Chanel Nolan CMA; 05/28/2020 11:21 AM) 05/28/2020 11:21 AM Weight: 169 lb Height: 66in Body Surface Area: 1.86 m Body Mass Index: 27.28 kg/m  Temp.: 93.38F  Pulse: 78 (Regular)  BP: 132/84(Sitting, Left Arm, Standard)        Physical Exam Rodman Key K. Lashanda Storlie MD; 05/28/2020 12:40 PM)  The physical exam findings are as follows: Note:Constitutional: WDWN in NAD, conversant, no obvious deformities; resting comfortably Eyes: Pupils equal, round; sclera anicteric; moist conjunctiva; no lid lag HENT: Oral mucosa moist; good dentition Neck: No masses palpated, trachea midline; no thyromegaly Lungs: CTA bilaterally; normal respiratory effort Breasts: symmetric; no nipple changes or discharge; no palpable lymphadenopathy in either axilla or [place] area; no right breast masses; left breast at 10:30 at edge of breast - firm irregular palpable mass about 3 cm across. No overlying skin changes CV: Regular rate and rhythm; no murmurs; extremities well-perfused with no edema Abd: +bowel sounds, soft, non-tender, no palpable organomegaly; no palpable hernias Musc: Normal gait; no apparent clubbing or cyanosis in  extremities Lymphatic: No palpable cervical or axillary lymphadenopathy Skin: Warm, dry; no sign of jaundice Psychiatric - alert and oriented x 4; calm mood and affect    Assessment & Plan Rodman Key K. Wildon Cuevas MD; 05/28/2020 12:43 PM)  INVASIVE DUCTAL CARCINOMA OF LEFT BREAST, STAGE 1 (C50.912)   BREAST NEOPLASM, TIS (DCIS), LEFT (D05.12)  Current Plans Schedule for Surgery - Ultrasound-guided port placement. The surgical  procedure has been discussed with the patient. Potential risks, benefits, alternative treatments, and expected outcomes have been explained. All of the patient's questions at this time have been answered. The likelihood of reaching the patient's treatment goal is good. The patient understand the proposed surgical procedure and wishes to proceed. MRI, BOTH BREASTS ([id number]) Referred to Oncology, for evaluation and follow up (Oncology). Routine. 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 Her case was also discussed in breast conference earlier in the week. I spent approximately 45 minutes with the patient and her husband out of this 65 minute appointment. We discussed her disease and the difference between DCIS and invasive ductal carcinoma. Due to the large size of the primary tumor in the second area of DCIS in the same quadrant, the recommendation from breast conference was to pursue bilateral breast MRI followed by neoadjuvant chemotherapy. This will increase the likelihood of a satisfactory outcome from breast conservation in the future. The patient is strongly in agreement with this plan. Based on her research, she had felt that this would be the best treatment plan for her cancers. She is quite happy that we are all in agreement. We will expedite her referral to medical oncology. She has also been referred to radiation oncology but this is not an urgent priority. An MRI has been ordered. We  discussed port placement for neoadjuvant chemotherapy. We will try to get that scheduled in the next 2-3 weeks.History of Present Illness Wilmon Arms. Zaryia Markel MD; 05/28/2020 12:36 PM) The patient is a 69 year old female who presents with breast cancer. PCP - Vivia Ewing NP Reason for eval - new diagnosis left breast IDC/ DCIS  This is a 69 year old female in good health who presents with a palpable mass in her left breast. The mass has been present for at least 5 months. She has not had a mammogram since 2013. Mammogram and ultrasound showed 2 areas in the left upper inner quadrant of concern. The palpable mass is 2.7 x 2.6 x 2.5 cm located at 08/14/09 centimeters from the nipple. 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 There was an area on the right side that was biopsied and revealed a fibroadenoma. The patient is from [place] and comes down to see [place] for her initial evaluation.    CLINICAL DATA: Palpable mass in the LEFT breast since the end of March.  EXAM: DIGITAL DIAGNOSTIC BILATERAL MAMMOGRAM WITH CAD AND TOMO  ULTRASOUND LEFT BREAST  COMPARISON: 05/01/2012 and 03/25/2010  ACR Breast Density Category a: The breast tissue is almost entirely fatty.  FINDINGS: RIGHT BREAST:  Mammogram: Within the UPPER central portion of the RIGHT breast there is a group of calcifications further evaluated with magnified views. These views demonstrate a 4 millimeter group of mildly heterogeneous calcifications associated with parenchymal asymmetry. Otherwise RIGHT breast is negative. Mammographic images were processed with CAD.  LEFT BREAST:  Mammogram: There is an oval mass with irregular margins in the UPPER INNER QUADRANT of the LEFT breast. In the LOWER INNER  QUADRANT of the LEFT breast, anterior depth, a group of calcifications is further evaluated with magnified views. These views demonstrate a group of linear and punctate calcifications spanning 0.5 x 1.0 centimeters. Mammographic images were  processed with CAD.  Physical Exam: I palpate a discrete mass in the [place] of the LEFT breast.  Ultrasound: Targeted ultrasound is performed, showing an oval mass with irregular margins in in the 10:30 o'clock location of the LEFT breast 10 centimeters from the nipple. Mass is 2.7 x 2.6 x 2.5 centimeters. Internal blood flow is present on Doppler evaluation.  Evaluation of the LEFT axilla shows a single lymph node with mildly thickened cortex of 4 millimeters. Other lymph nodes have normal morphology.  IMPRESSION: 1. Indeterminate 4 millimeter group of calcifications in the UPPER central RIGHT breast. 2. Suspicious mass in the 10:30 o'clock location of the LEFT breast. 3. Suspicious calcifications in the anterior LOWER INNER QUADRANT of the LEFT breast. 4. Indeterminate single LEFT axillary lymph node.  RECOMMENDATION: 1. Recommend stereotactic guided core biopsy of calcifications in the UPPER central RIGHT breast in the anterior LOWER INNER QUADRANT of the LEFT breast. 2. Recommend ultrasound-guided core biopsy of mass in the 10:30 o'clock location of the LEFT breast and mildly prominent LEFT axillary lymph node. 3. Recommend biopsies be scheduled for 2 separate days, scheduled on 05/14/2020 and 05/17/2020.  I have discussed the findings and recommendations with the patient. If applicable, a reminder letter will be sent to the patient regarding the next appointment.  BI-RADS CATEGORY 5: Highly suggestive of malignancy.   Electronically Signed By: Nolon Nations M.D. On: 05/07/2020 13:18  CLINICAL DATA: 69 year old female status post ultrasound-guided biopsy of the left breast and axilla.  EXAM: DIAGNOSTIC  LEFT MAMMOGRAM POST ULTRASOUND BIOPSY  COMPARISON: Previous exam(s).  FINDINGS: Mammographic images were obtained following ultrasound guided biopsy of the left breast and axilla. A ribbon shaped clip is identified within the hyperdense mass in the upper inner quadrant of the left breast. A heart shaped clip is identified within a left axillary lymph node.  IMPRESSION: 1. Appropriate positioning of both post biopsy marking clips at the sites of biopsy in the upper inner left breast and left axilla. 2. Please note, the Q-shaped clip was not placed in the left axillary lymph node due to the patient's nickel allergy. A titanium heart shaped clip was placed instead.  Final Assessment: Post Procedure Mammograms for Marker Placement   Electronically Signed By: Kristopher Oppenheim M.D. On: 05/17/2020 16:03   Problem List/Past Medical Rodman Key K. 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  Diagnostic Studies History (Chanel Teressa Senter, CMA; 05/28/2020 11:18 AM) Colonoscopy 5-10 years ago Mammogram within last year Pap Smear 1-5 years ago  Allergies (Chanel Teressa Senter, CMA; 05/28/2020 11:21 AM) Amoxicillin *PENICILLINS* Pineapple Concentrate *PHARMACEUTICAL ADJUVANTS* Allergies Reconciled  Medication History (Chanel Teressa Senter, CMA; 05/28/2020 11:21 AM) amLODIPine Besy-Benazepril HCl (5-'20MG'$  Capsule, Oral) Active. Medications Reconciled  Pregnancy / Birth History Jo Mills, CMA; 05/28/2020 11:18 AM) Age at menarche 40 years. Age of menopause >68 Gravida 0 Para 0 Regular periods  Other Problems Jo Mills. Lettie Czarnecki, MD; 05/28/2020 12:36 PM) Breast Cancer Diverticulosis Gastroesophageal Reflux Disease High blood pressure Lump In Breast     Review of Systems (Chanel Nolan CMA; 05/28/2020  11:18 AM) General Not Present- Appetite Loss, Chills, Fatigue, Fever, Night Sweats, Weight Gain and Weight Loss. Skin Present- Dryness. Not Present- Change in Wart/Mole, Hives, Jaundice, New Lesions, Non-Healing Wounds, Rash and Ulcer. HEENT Present- Seasonal Allergies and Wears glasses/contact lenses. Not Present- Earache, Hearing Loss, Hoarseness, Nose Bleed, Oral  Ulcers, Ringing in the Ears, Sinus Pain, Sore Throat, Visual Disturbances and Yellow Eyes. Respiratory Not Present- Bloody sputum, Chronic Cough, Difficulty Breathing, Snoring and Wheezing. Breast Present- Breast Mass. Not Present- Breast Pain, Nipple Discharge and Skin Changes. Cardiovascular Not Present- Chest Pain, Difficulty Breathing Lying Down, Leg Cramps, Palpitations, Rapid Heart Rate, Shortness of Breath and Swelling of Extremities. Gastrointestinal Not Present- Abdominal Pain, Bloating, Bloody Stool, Change in Bowel Habits, Chronic diarrhea, Constipation, Difficulty Swallowing, Excessive gas, Gets full quickly at meals, Hemorrhoids, Indigestion, Nausea, Rectal Pain and Vomiting. Female Genitourinary Not Present- Frequency, Nocturia, Painful Urination, Pelvic Pain and Urgency. Musculoskeletal Not Present- Back Pain, Joint Pain, Joint Stiffness, Muscle Pain, Muscle Weakness and Swelling of Extremities. Neurological Not Present- Decreased Memory, Fainting, Headaches, Numbness, Seizures, Tingling, Tremor, Trouble walking and Weakness. Psychiatric Not Present- Anxiety, Bipolar, Change in Sleep Pattern, Depression, Fearful and Frequent crying. Endocrine Present- Hot flashes. Not Present- Cold Intolerance, Excessive Hunger, Hair Changes, Heat Intolerance and New Diabetes. Hematology Not Present- Blood Thinners, Easy Bruising, Excessive bleeding, Gland problems, HIV and Persistent Infections.  Vitals (Chanel Nolan CMA; 05/28/2020 11:21 AM) 05/28/2020 11:21 AM Weight: 169 lb Height: 66in Body Surface Area: 1.86 m Body Mass Index:  27.28 kg/m  Temp.: 93.26F  Pulse: 78 (Regular)  BP: 132/84(Sitting, Left Arm, Standard)        Physical Exam Molli Hazard K. Giabella Duhart MD; 05/28/2020 12:40 PM)  The physical exam findings are as follows: Note:Constitutional: WDWN in NAD, conversant, no obvious deformities; resting comfortably Eyes: Pupils equal, round; sclera anicteric; moist conjunctiva; no lid lag HENT: Oral mucosa moist; good dentition Neck: No masses palpated, trachea midline; no thyromegaly Lungs: CTA bilaterally; normal respiratory effort Breasts: symmetric; no nipple changes or discharge; no palpable lymphadenopathy in either axilla or [place] area; no right breast masses; left breast at 10:30 at edge of breast - firm irregular palpable mass about 3 cm across. No overlying skin changes CV: Regular rate and rhythm; no murmurs; extremities well-perfused with no edema Abd: +bowel sounds, soft, non-tender, no palpable organomegaly; no palpable hernias Musc: Normal gait; no apparent clubbing or cyanosis in extremities Lymphatic: No palpable cervical or axillary lymphadenopathy Skin: Warm, dry; no sign of jaundice Psychiatric - alert and oriented x 4; calm mood and affect    Assessment & Plan Molli Hazard K. Margart Zemanek MD; 05/28/2020 12:43 PM)  INVASIVE DUCTAL CARCINOMA OF LEFT BREAST, STAGE 1 (C50.912)   BREAST NEOPLASM, TIS (DCIS), LEFT (D05.12)  Current Plans Schedule for Surgery - Ultrasound-guided port placement. The surgical procedure has been discussed with the patient. Potential risks, benefits, alternative treatments, and expected outcomes have been explained. All of the patient's questions at this time have been answered. The likelihood of reaching the patient's treatment goal is good. The patient understand the proposed surgical procedure and wishes to proceed. MRI, BOTH BREASTS (61612) Referred to Oncology, for evaluation and follow up (Oncology). Routine. 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 Her case was also discussed in breast conference earlier in the week. I spent approximately 45 minutes with the patient and her husband out of this 65 minute appointment. We discussed her disease and the difference between DCIS and invasive ductal carcinoma. Due to the large size of the primary tumor in the second area of DCIS in the same quadrant, the recommendation from breast conference was to pursue bilateral breast MRI followed by neoadjuvant chemotherapy. This will increase the likelihood of a satisfactory outcome from breast  conservation in the future. The patient is strongly in agreement with this plan. Based on her research, she had felt that this would be the best treatment plan for her cancers. She is quite happy that we are all in agreement. We will expedite her referral to medical oncology. She has also been referred to radiation oncology but this is not an urgent priority. An MRI has been ordered. We discussed port placement for neoadjuvant chemotherapy. We will try to get that scheduled in the next 2-3 weeks.History of Present Illness Jo Mills. Cevin Rubinstein MD; 05/28/2020 12:36 PM) The patient is a 69 year old female who presents with breast cancer. PCP - Valentina Shaggy NP Reason for eval - new diagnosis left breast IDC/ DCIS  This is a 69 year old female in good health who presents with a palpable mass in her left breast. The mass has been present for at least 5 months. She has not had a mammogram since 2013. Mammogram and ultrasound showed 2 areas in the left upper inner quadrant of concern. The palpable mass is 2.7 x 2.6 x 2.5 cm located at 08/14/09 centimeters from the nipple. 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  There is a 1 cm area of calcifications also in the left upper inner quadrant was biopsied and revealed high-grade DCIS with calcifications and necrosis. ER/PR  negative. There was an area on the right side that was biopsied and revealed a fibroadenoma. The patient is from [place] and comes down to see [place] for her initial evaluation.    CLINICAL DATA: Palpable mass in the LEFT breast since the end of March.  EXAM: DIGITAL DIAGNOSTIC BILATERAL MAMMOGRAM WITH CAD AND TOMO  ULTRASOUND LEFT BREAST  COMPARISON: 05/01/2012 and 03/25/2010  ACR Breast Density Category a: The breast tissue is almost entirely fatty.  FINDINGS: RIGHT BREAST:  Mammogram: Within the UPPER central portion of the RIGHT breast there is a group of calcifications further evaluated with magnified views. These views demonstrate a 4 millimeter group of mildly heterogeneous calcifications associated with parenchymal asymmetry. Otherwise RIGHT breast is negative. Mammographic images were processed with CAD.  LEFT BREAST:  Mammogram: There is an oval mass with irregular margins in the UPPER INNER QUADRANT of the LEFT breast. In the LOWER INNER QUADRANT of the LEFT breast, anterior depth, a group of calcifications is further evaluated with magnified views. These views demonstrate a group of linear and punctate calcifications spanning 0.5 x 1.0 centimeters. Mammographic images were processed with CAD.  Physical Exam: I palpate a discrete mass in the [place] of the LEFT breast.  Ultrasound: Targeted ultrasound is performed, showing an oval mass with irregular margins in in the 10:30 o'clock location of the LEFT breast 10 centimeters from the nipple. Mass is 2.7 x 2.6 x 2.5 centimeters. Internal blood flow is present on Doppler evaluation.  Evaluation of the LEFT axilla shows a single lymph node with mildly thickened cortex of 4 millimeters. Other lymph nodes have normal morphology.  IMPRESSION: 1. Indeterminate 4 millimeter group of calcifications in the UPPER central RIGHT breast. 2. Suspicious mass in the 10:30 o'clock location of the LEFT  breast. 3. Suspicious calcifications in the anterior LOWER INNER QUADRANT of the LEFT breast. 4. Indeterminate single LEFT axillary lymph node.  RECOMMENDATION: 1. Recommend stereotactic guided core biopsy of calcifications in the UPPER central RIGHT breast in the anterior LOWER INNER QUADRANT of the LEFT breast. 2. Recommend ultrasound-guided core biopsy of mass in the  10:30 o'clock location of the LEFT breast and mildly prominent LEFT axillary lymph node. 3. Recommend biopsies be scheduled for 2 separate days, scheduled on 05/14/2020 and 05/17/2020.  I have discussed the findings and recommendations with the patient. If applicable, a reminder letter will be sent to the patient regarding the next appointment.  BI-RADS CATEGORY 5: Highly suggestive of malignancy.   Electronically Signed By: Nolon Nations M.D. On: 05/07/2020 13:18  CLINICAL DATA: 69 year old female status post ultrasound-guided biopsy of the left breast and axilla.  EXAM: DIAGNOSTIC LEFT MAMMOGRAM POST ULTRASOUND BIOPSY  COMPARISON: Previous exam(s).  FINDINGS: Mammographic images were obtained following ultrasound guided biopsy of the left breast and axilla. A ribbon shaped clip is identified within the hyperdense mass in the upper inner quadrant of the left breast. A heart shaped clip is identified within a left axillary lymph node.  IMPRESSION: 1. Appropriate positioning of both post biopsy marking clips at the sites of biopsy in the upper inner left breast and left axilla. 2. Please note, the Q-shaped clip was not placed in the left axillary lymph node due to the patient's nickel allergy. A titanium heart shaped clip was placed instead.  Final Assessment: Post Procedure Mammograms for Marker Placement   Electronically Signed By: Kristopher Oppenheim M.D. On: 05/17/2020 16:03   Problem List/Past Medical Rodman Key K. Brynne Doane, MD; 05/28/2020 12:36 PM) BREAST NEOPLASM, TIS (DCIS), LEFT  (D05.12) INVASIVE DUCTAL CARCINOMA OF LEFT BREAST, STAGE 1 (W11.914)  Past Surgical History (Chanel Teressa Senter, CMA; 05/28/2020 11:18 AM) Breast Biopsy Bilateral.  Diagnostic Studies History (Chanel Teressa Senter, CMA; 05/28/2020 11:18 AM) Colonoscopy 5-10 years ago Mammogram within last year Pap Smear 1-5 years ago  Allergies (Chanel Teressa Senter, CMA; 05/28/2020 11:21 AM) Amoxicillin *PENICILLINS* Pineapple Concentrate *PHARMACEUTICAL ADJUVANTS* Allergies Reconciled  Medication History (Chanel Teressa Senter, CMA; 05/28/2020 11:21 AM) amLODIPine Besy-Benazepril HCl (5-'20MG'$  Capsule, Oral) Active. Medications Reconciled  Pregnancy / Birth History Jo Mills, CMA; 05/28/2020 11:18 AM) Age at menarche 16 years. Age of menopause >70 Gravida 0 Para 0 Regular periods  Other Problems Jo Mills. Laetitia Schnepf, MD; 05/28/2020 12:36 PM) Breast Cancer Diverticulosis Gastroesophageal Reflux Disease High blood pressure Lump In Breast     Review of Systems (Chanel Nolan CMA; 05/28/2020 11:18 AM) General Not Present- Appetite Loss, Chills, Fatigue, Fever, Night Sweats, Weight Gain and Weight Loss. Skin Present- Dryness. Not Present- Change in Wart/Mole, Hives, Jaundice, New Lesions, Non-Healing Wounds, Rash and Ulcer. HEENT Present- Seasonal Allergies and Wears glasses/contact lenses. Not Present- Earache, Hearing Loss, Hoarseness, Nose Bleed, Oral Ulcers, Ringing in the Ears, Sinus Pain, Sore Throat, Visual Disturbances and Yellow Eyes. Respiratory Not Present- Bloody sputum, Chronic Cough, Difficulty Breathing, Snoring and Wheezing. Breast Present- Breast Mass. Not Present- Breast Pain, Nipple Discharge and Skin Changes. Cardiovascular Not Present- Chest Pain, Difficulty Breathing Lying Down, Leg Cramps, Palpitations, Rapid Heart Rate, Shortness of Breath and Swelling of Extremities. Gastrointestinal Not Present- Abdominal Pain, Bloating, Bloody Stool, Change in Bowel Habits, Chronic diarrhea,  Constipation, Difficulty Swallowing, Excessive gas, Gets full quickly at meals, Hemorrhoids, Indigestion, Nausea, Rectal Pain and Vomiting. Female Genitourinary Not Present- Frequency, Nocturia, Painful Urination, Pelvic Pain and Urgency. Musculoskeletal Not Present- Back Pain, Joint Pain, Joint Stiffness, Muscle Pain, Muscle Weakness and Swelling of Extremities. Neurological Not Present- Decreased Memory, Fainting, Headaches, Numbness, Seizures, Tingling, Tremor, Trouble walking and Weakness. Psychiatric Not Present- Anxiety, Bipolar, Change in Sleep Pattern, Depression, Fearful and Frequent crying. Endocrine Present- Hot flashes. Not Present- Cold Intolerance, Excessive Hunger, Hair Changes, Heat Intolerance and New Diabetes. Hematology Not Present-  Blood Thinners, Easy Bruising, Excessive bleeding, Gland problems, HIV and Persistent Infections.  Vitals (Chanel Nolan CMA; 05/28/2020 11:21 AM) 05/28/2020 11:21 AM Weight: 169 lb Height: 66in Body Surface Area: 1.86 m Body Mass Index: 27.28 kg/m  Temp.: 93.90F  Pulse: 78 (Regular)  BP: 132/84(Sitting, Left Arm, Standard)        Physical Exam Rodman Key K. Omeed Osuna MD; 05/28/2020 12:40 PM)  The physical exam findings are as follows: Note:Constitutional: WDWN in NAD, conversant, no obvious deformities; resting comfortably Eyes: Pupils equal, round; sclera anicteric; moist conjunctiva; no lid lag HENT: Oral mucosa moist; good dentition Neck: No masses palpated, trachea midline; no thyromegaly Lungs: CTA bilaterally; normal respiratory effort Breasts: symmetric; no nipple changes or discharge; no palpable lymphadenopathy in either axilla or [place] area; no right breast masses; left breast at 10:30 at edge of breast - firm irregular palpable mass about 3 cm across. No overlying skin changes CV: Regular rate and rhythm; no murmurs; extremities well-perfused with no edema Abd: +bowel sounds, soft, non-tender, no palpable organomegaly; no  palpable hernias Musc: Normal gait; no apparent clubbing or cyanosis in extremities Lymphatic: No palpable cervical or axillary lymphadenopathy Skin: Warm, dry; no sign of jaundice Psychiatric - alert and oriented x 4; calm mood and affect    Assessment & Plan Rodman Key K. Raquan Iannone MD; 05/28/2020 12:43 PM)  INVASIVE DUCTAL CARCINOMA OF LEFT BREAST, STAGE 1 (C50.912)   BREAST NEOPLASM, TIS (DCIS), LEFT (D05.12)  Current Plans Schedule for Surgery - Ultrasound-guided port placement. The surgical procedure has been discussed with the patient. Potential risks, benefits, alternative treatments, and expected outcomes have been explained. All of the patient's questions at this time have been answered. The likelihood of reaching the patient's treatment goal is good. The patient understand the proposed surgical procedure and wishes to proceed. MRI, BOTH BREASTS ([id number]) Referred to Oncology, for evaluation and follow up (Oncology). Routine. 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 Her case was also discussed in breast conference earlier in the week. I spent approximately 45 minutes with the patient and her husband out of this 65 minute appointment. We discussed her disease and the difference between DCIS and invasive ductal carcinoma. Due to the large size of the primary tumor in the second area of DCIS in the same quadrant, the recommendation from breast conference was to pursue bilateral breast MRI followed by neoadjuvant chemotherapy. This will increase the likelihood of a satisfactory outcome from breast conservation in the future. The patient is strongly in agreement with this plan. Based on her research, she had felt that this would be the best treatment plan for her cancers. She is quite happy that we are all in agreement. We will expedite her referral to medical oncology. She has also been referred to radiation oncology  but this is not an urgent priority. An MRI has been ordered. We discussed port placement for neoadjuvant chemotherapy. We will try to get that scheduled in the next 2-3 weeks.  Jo Mills. Georgette Dover, MD, [facility]  General/ Trauma Surgery   05/28/2020 12:44 PM

## 2020-05-31 ENCOUNTER — Telehealth: Payer: Self-pay | Admitting: Hematology and Oncology

## 2020-05-31 NOTE — Telephone Encounter (Signed)
Received an urgent referral from Dr. Georgette Dover for a new dx of breast cancer. Mrs. Fiallo has been cld and scheduled to see Dr. [name] on 8/24 at 1pm. Pt aware to arrive 15 minutes early.

## 2020-06-02 ENCOUNTER — Encounter: Payer: BC Managed Care – PPO | Admitting: Genetic Counselor

## 2020-06-05 ENCOUNTER — Ambulatory Visit
Admission: RE | Admit: 2020-06-05 | Discharge: 2020-06-05 | Disposition: A | Discharge status: Home / Self Care | Payer: BC Managed Care – PPO | Source: Ambulatory Visit | Attending: Surgery | Admitting: Surgery

## 2020-06-05 ENCOUNTER — Other Ambulatory Visit: Payer: Self-pay

## 2020-06-05 DIAGNOSIS — C50912 Malignant neoplasm of unspecified site of left female breast: Secondary | ICD-10-CM

## 2020-06-05 IMAGING — MR MR BREAST BILAT WO/W CM
8 of 12 series · 32 of 48 positions shown · IV contrast (8 ML GADAVIST)
Comparison: Previous exam(s).

CLINICAL DATA: 68-year-old female with newly diagnosed grade 3
invasive ductal carcinoma of the posterior upper inner left breast
and high-grade ductal carcinoma in situ of the anterior upper inner
left breast. Additional biopsies in the right breast and of a left
axillary lymph node demonstrated benign findings.

LABS:  None performed density.
EXAM:
BILATERAL BREAST MRI WITH AND WITHOUT CONTRAST
TECHNIQUE: Multiplanar, multisequence MR images of both breasts were obtained
prior to and following the intravenous administration of 8 ml of
Gadavist.

[Series 2: t2_tirm_tra ipat (a-p) · axial · 3.0mm · 0.70mm/px · 1 of 55 slices shown]
[im 1/55]
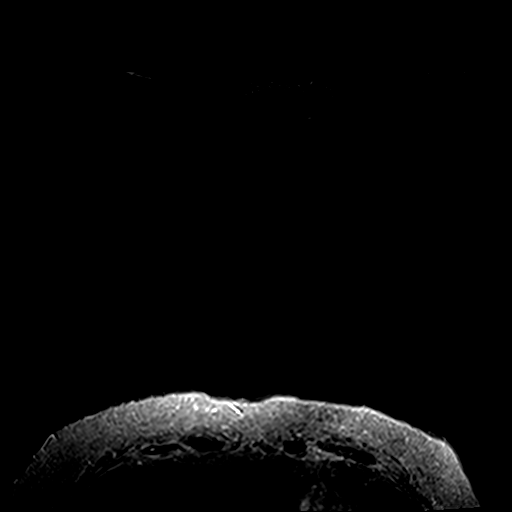

[Series 3: fl3d pre-cm no · axial · non-contrast · 1.2mm · 0.94mm/px · z∈[-75,+96]mm · 5 of 144 slices shown]
[im 1/144]
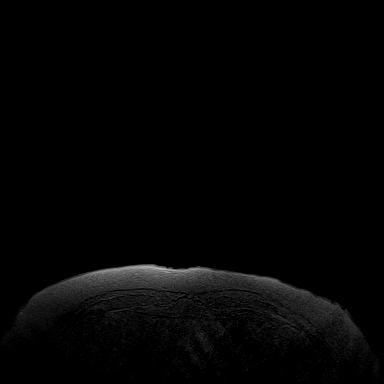
[im 36/144]
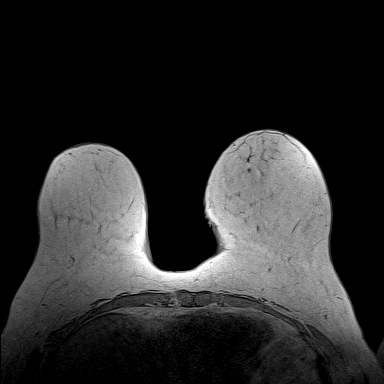
[im 72/144]
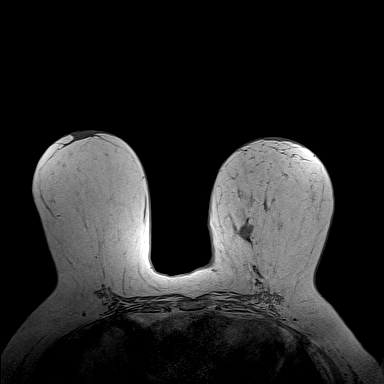
[im 108/144]
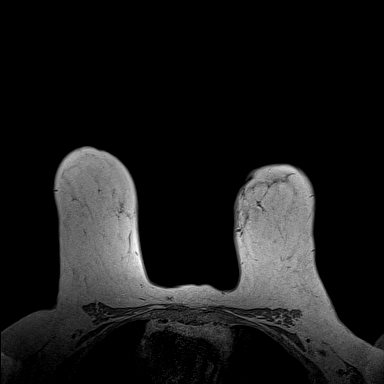
[im 144/144]
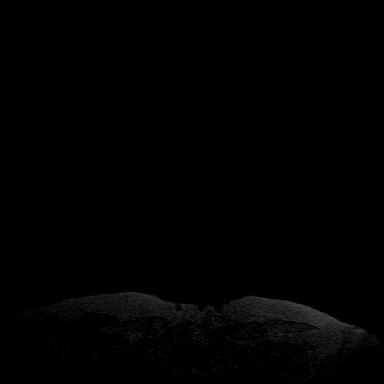

[Series 4: fl3d pre-cm · axial · non-contrast · 1.2mm · 0.94mm/px · z∈[-75,+96]mm · 5 of 144 slices shown]
[im 1/144]
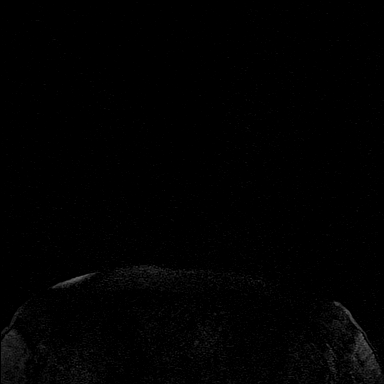
[im 36/144]
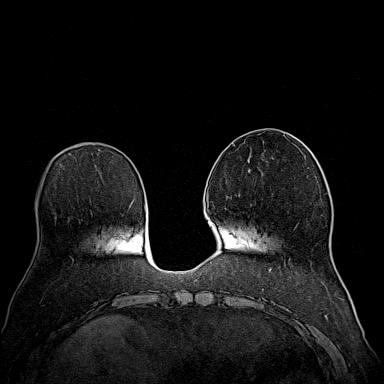
[im 72/144]
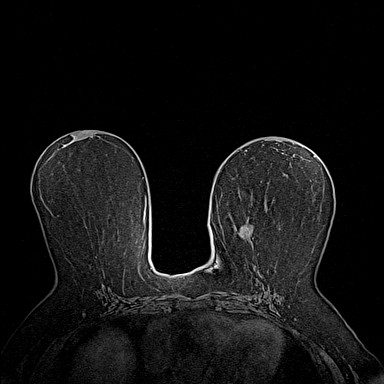
[im 108/144]
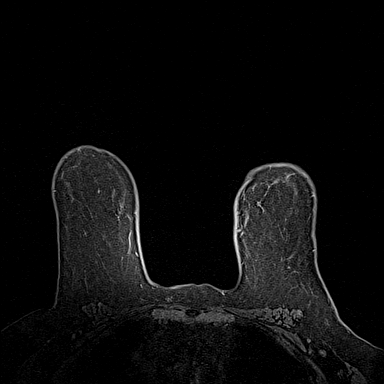
[im 144/144]
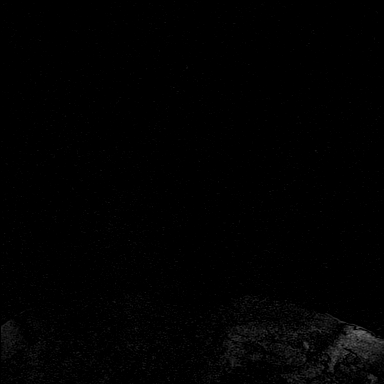

[Series 5: fl3d post-cm 20 · axial · 1.2mm · 0.94mm/px · z∈[-75,+96]mm · 5 of 144 slices shown (1 of 3)]
[im 1/144]
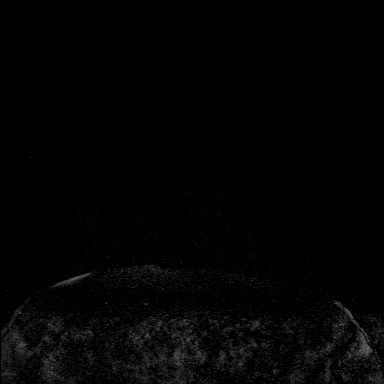
[im 36/144]
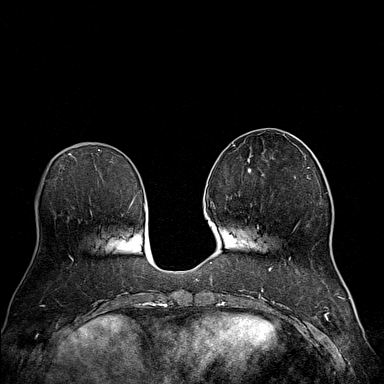
[im 72/144]
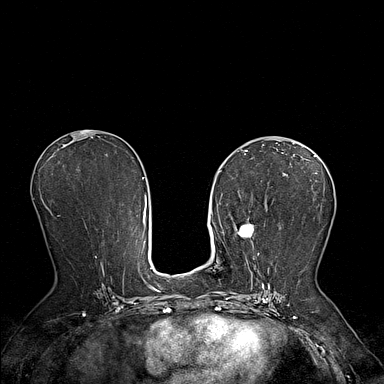
[im 108/144]
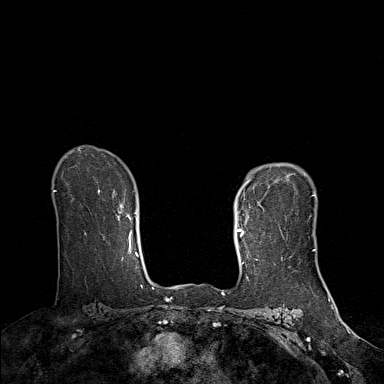
[im 144/144]
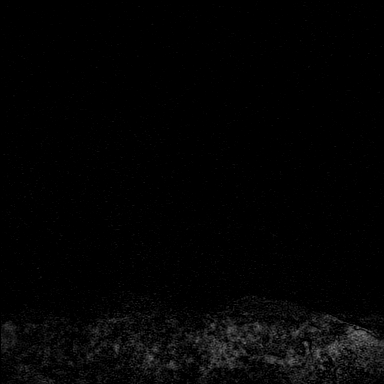

[Series 6: fl3d post-cm 20 · axial · 1.2mm · 0.94mm/px · z∈[-75,+96]mm · 5 of 144 slices shown (2 of 3)]
[im 1/144]
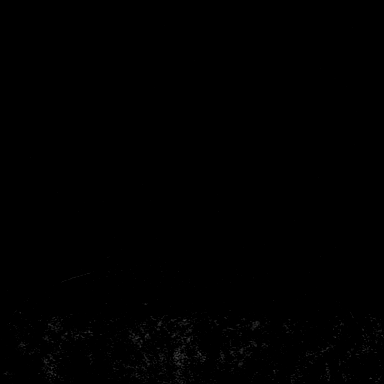
[im 36/144]
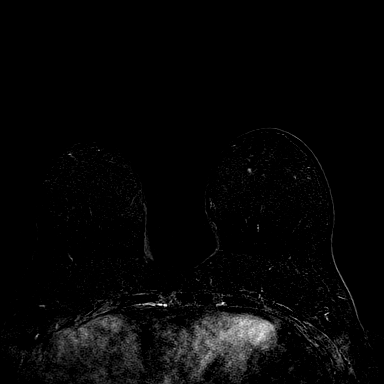
[im 72/144]
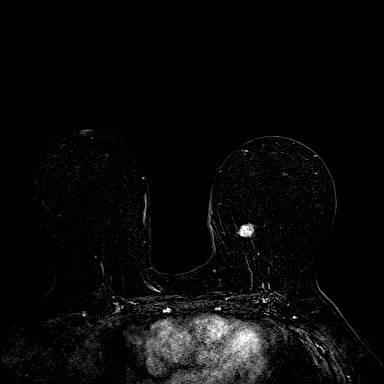
[im 108/144]
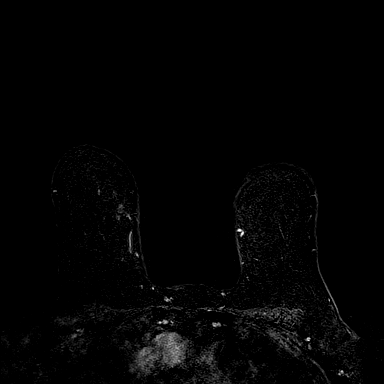
[im 144/144]
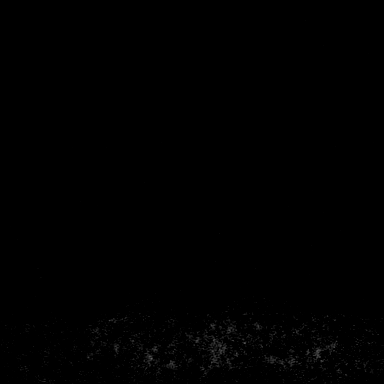

[Series 7: fl3d post-cm 20 · axial · 172.8mm · 0.94mm/px · 1 of 1 slices shown (3 of 3)]
[im 1/1]
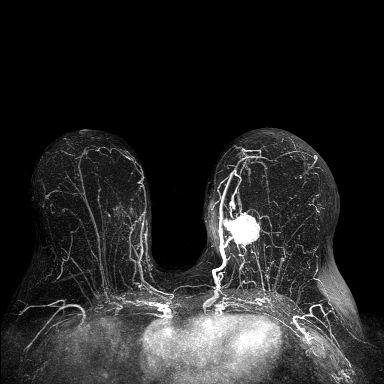

[Series 8: fl3d post-cm 3min · axial · 1.2mm · 0.94mm/px · z∈[-75,+96]mm · 6 of 144 slices shown]
[im 1/144]
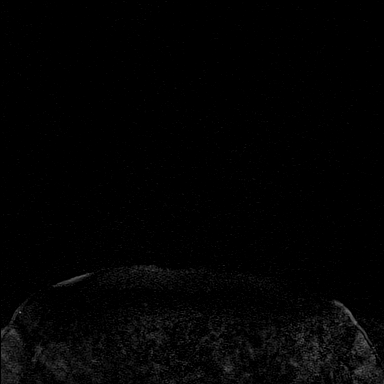
[im 29/144]
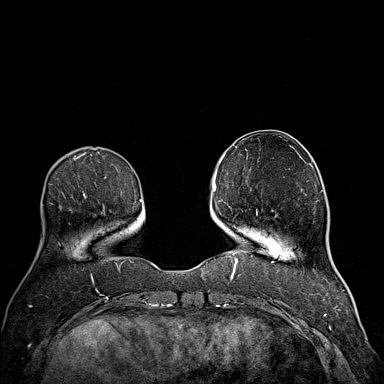
[im 58/144]
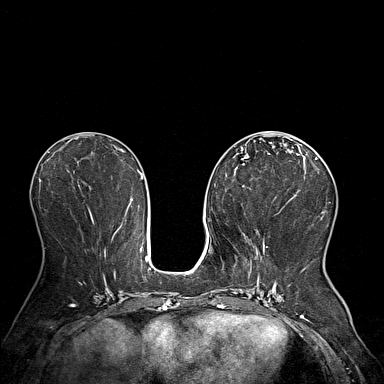
[im 86/144]
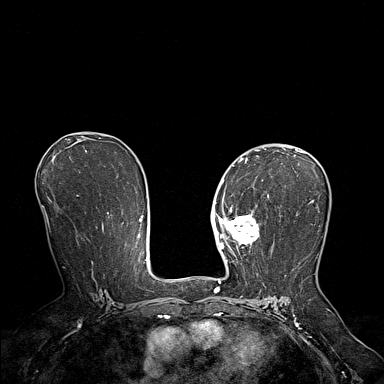
[im 115/144]
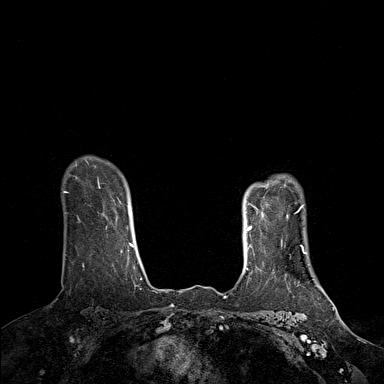
[im 144/144]
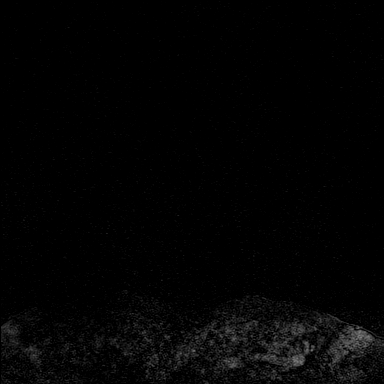

[Series 9: fl3d post-cm 3min_sub · axial · 1.2mm · 0.94mm/px · z∈[-75,+27]mm · 4 of 144 slices shown]
[im 1/144]
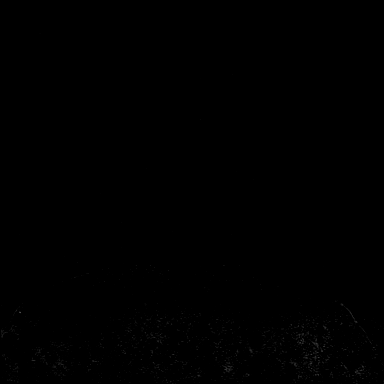
[im 29/144]
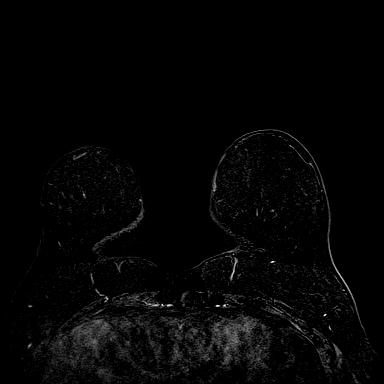
[im 58/144]
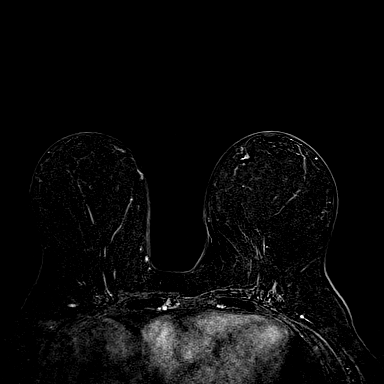
[im 86/144]
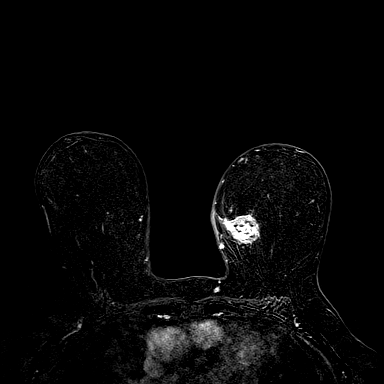

[32 of 48 positions shown; findings below may reference images not displayed]

Three-dimensional MR images were rendered by post-processing of the
original MR data on an independent workstation. The
three-dimensional MR images were interpreted, and findings are
reported in the following complete MRI report for this study. Three
dimensional images were evaluated at the independent interpreting
workstation using the DynaCAD thin client.
FINDINGS: Breast composition: a. Almost entirely fat.

Background parenchymal enhancement: Mild.

Right breast: No suspicious mass or abnormal enhancement.
Susceptibility artifact from post biopsy clip is demonstrated in the
upper inner quadrant at middle depth. This is consistent with a site
of benign biopsy.

Left breast: Post biopsy related changes are seen in association
with a spiculated, heterogeneously enhancing mass in the upper inner
left breast at middle depth (series 5, image 56/144). The mass
measures 3.8 x 3.3 x 3.2 cm. An additional area of a post biopsy
related changes is demonstrated in the upper inner quadrant at
anterior depth (series 5, image 293/144). This is consistent with
the site of biopsied calcifications demonstrating high-grade DCIS.
No additional suspicious findings in the remainder of the left
breast.).

Lymph nodes: No abnormal appearing lymph nodes.

Ancillary findings:  None.
IMPRESSION: 1. 3.8 cm enhancing mass in the upper inner left breast consistent
with the patient's biopsy-proven site of invasive malignancy.
2. Biopsy-proven high-grade DCIS in the anterior upper inner left
breast.
3. No MRI evidence of malignancy on the right.
4. No suspicious lymphadenopathy.

RECOMMENDATION:
Per clinical treatment plan.

BI-RADS CATEGORY  6: Known biopsy-proven malignancy.

## 2020-06-05 MED ORDER — GADOBUTROL 1 MMOL/ML IV SOLN
8.0000 mL | Freq: Once | INTRAVENOUS | Status: AC | PRN
  Administered 2020-06-05: 8 mL via INTRAVENOUS

## 2020-06-07 NOTE — Progress Notes (Signed)
[facility] CONSULT NOTE  Patient Care Team: Roderic Scarce, MD as PCP - General (Nurse Practitioner)  CHIEF COMPLAINTS/PURPOSE OF CONSULTATION:  Newly diagnosed breast cancer  HISTORY OF PRESENTING ILLNESS:  Jo Mills 69 y.o. female is here because of recent diagnosis of invasive ductal carcinoma of the left breast. Patient palpated a left breast mass for 5 months. Mammogram and [place] on 05/07/20 showed a 2.7cm mass at the 10:30 position, one left axillary lymph node with cortical thickening, calcifications in the lower inner left breast, and a 0.4cm group of calcifications in the right breast. Biopsy on 05/14/20 showed no malignancy in the right breast, and in the left breast, DCIS, high grade, ER/PR negative. 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 She presents to the clinic today for initial evaluation and discussion of treatment options.   I reviewed her records extensively and collaborated the history with the patient.  SUMMARY OF ONCOLOGIC HISTORY: Oncology History  Malignant neoplasm of overlapping sites of left breast in female, estrogen receptor negative ([place])  05/17/2020 Cancer Staging   Staging form: Breast, AJCC 8th Edition - Clinical stage from 05/17/2020: Stage IIB (cT2, cN0, cM0, G3, ER-, PR-, HER2-) - Signed by Gardenia Phlegm, NP on 05/26/2020   05/26/2020 Initial Diagnosis   Patient palpated a left breast mass for 5 months. Mammogram and US showed a 2.7cm mass at the 10:30 position, one left axillary lymph node with cortical thickening, calcifications in the lower inner left breast, and a 0.4cm group of calcifications in the right breast. Biopsy on 05/14/20 showed no malignancy in the right breast, and in the left breast, DCIS, high grade, ER/PR negative. Biopsy on 05/17/20 showed no malignancy in the axilla, and invasive  ductal carcinoma at the 10:30 position in the left breast, grade 3, HER-2 negative (1+), ER/PR negative, Ki67 85%.       MEDICAL HISTORY:  Hypertension, diverticulosis, GERD, breast cancer SURGICAL HISTORY: Breast biopsies SOCIAL HISTORY: Denies any tobacco or alcohol or recreational drug use FAMILY HISTORY: 2 cousins had breast cancer  ALLERGIES:  is allergic to pineapple and amoxicillin.  MEDICATIONS: Amlodipine benazepril 5-20 mg daily REVIEW OF SYSTEMS:    Breast: Palpable left breast mass All other systems were reviewed with the patient and are negative.  PHYSICAL EXAMINATION: ECOG PERFORMANCE STATUS: 1 - Symptomatic but completely ambulatory  Vitals:   06/08/20 1554  BP: (!) 148/88  Pulse: 77  Resp: 18  Temp: 98.8 F (37.1 C)  SpO2: 100%   Filed Weights   06/08/20 1554  Weight: 166 lb (75.3 kg)    RADIOGRAPHIC STUDIES: I have personally reviewed the radiological reports and agreed with the findings in the report.  ASSESSMENT AND PLAN:  Malignant neoplasm of overlapping sites of left breast in female, estrogen receptor negative ([place]) 05/26/2020:Patient palpated a left breast mass for 5 months. Mammogram and US showed a 2.7cm mass at the 10:30 position, one left axillary lymph node with cortical thickening, calcifications in the lower inner left breast, and a 0.4cm group of calcifications in the right breast. Biopsy on 05/14/20 showed no malignancy in the right breast, and in the left breast, DCIS, high grade, ER/PR negative. 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  Pathology and radiology counseling: Discussed with the patient, the details of pathology including the type of breast cancer,the clinical staging, the significance of ER, PR and HER-2/neu receptors and the implications for treatment. After reviewing the pathology in  detail, we proceeded to discuss the different treatment options between surgery, radiation, chemotherapy, antiestrogen therapies.  Treatment plan: 1.  Neoadjuvant chemotherapy with Adriamycin Cytoxan Keytruda every 3 weeks x4 followed by Taxol Doristine Church followed by Beryle Flock maintenance 2. Breast conserving surgery with targeted node dissection 2. Adjuvant radiation therapy  Chemotherapy Counseling: I discussed the risks and benefits of chemotherapy including the risks of nausea/ vomiting, risk of infection from low WBC count, fatigue due to chemo or anemia, bruising or bleeding due to low platelets, mouth sores, loss/ change in taste and decreased appetite. Liver and kidney function will be monitored through out chemotherapy as abnormalities in liver and kidney function may be a side effect of treatment. Cardiac dysfunction due to Adriamycin was discussed in detail.  I discussed the immune mediated adverse effects of Keytruda in great detail.  We will monitor her for hypothyroidism.  Risk of permanent bone marrow dysfunction and leukemia due to chemo were also discussed.  Port placement has been scheduled for next Tuesday. We will plan to start her chemotherapy next Wednesday.  All questions were answered. The patient knows to call the clinic with any problems, questions or concerns.   Rulon Eisenmenger, MD, MPH 06/08/2020    I, Molly Dorshimer, am acting as scribe for Nicholas Lose, MD.  I have reviewed the above documentation for accuracy and completeness, and I agree with the above.

## 2020-06-08 ENCOUNTER — Inpatient Hospital Stay: Payer: BC Managed Care – PPO | Attending: Hematology and Oncology | Admitting: Hematology and Oncology

## 2020-06-08 ENCOUNTER — Telehealth: Payer: Self-pay

## 2020-06-08 ENCOUNTER — Other Ambulatory Visit: Payer: Self-pay

## 2020-06-08 ENCOUNTER — Encounter (HOSPITAL_BASED_OUTPATIENT_CLINIC_OR_DEPARTMENT_OTHER): Payer: Self-pay | Admitting: Surgery

## 2020-06-08 VITALS — BP 148/88 | HR 77 | Temp 98.8°F | Resp 18 | Ht 66.0 in | Wt 166.0 lb

## 2020-06-08 DIAGNOSIS — Z79899 Other long term (current) drug therapy: Secondary | ICD-10-CM

## 2020-06-08 DIAGNOSIS — Z880 Allergy status to penicillin: Secondary | ICD-10-CM | POA: Diagnosis not present

## 2020-06-08 DIAGNOSIS — Z803 Family history of malignant neoplasm of breast: Secondary | ICD-10-CM | POA: Insufficient documentation

## 2020-06-08 DIAGNOSIS — N6314 Unspecified lump in the right breast, lower inner quadrant: Secondary | ICD-10-CM | POA: Diagnosis not present

## 2020-06-08 DIAGNOSIS — I10 Essential (primary) hypertension: Secondary | ICD-10-CM | POA: Diagnosis not present

## 2020-06-08 DIAGNOSIS — K5790 Diverticulosis of intestine, part unspecified, without perforation or abscess without bleeding: Secondary | ICD-10-CM | POA: Diagnosis not present

## 2020-06-08 DIAGNOSIS — C50812 Malignant neoplasm of overlapping sites of left female breast: Secondary | ICD-10-CM

## 2020-06-08 DIAGNOSIS — K219 Gastro-esophageal reflux disease without esophagitis: Secondary | ICD-10-CM | POA: Insufficient documentation

## 2020-06-08 DIAGNOSIS — Z171 Estrogen receptor negative status [ER-]: Secondary | ICD-10-CM | POA: Diagnosis not present

## 2020-06-08 MED ORDER — DEXAMETHASONE 4 MG PO TABS: ORAL_TABLET | ORAL | 1 refills | Status: DC

## 2020-06-08 MED ORDER — PROCHLORPERAZINE MALEATE 10 MG PO TABS: 10.0000 mg | ORAL_TABLET | Freq: Four times a day (QID) | ORAL | 1 refills | Status: DC | PRN

## 2020-06-08 MED ORDER — LORAZEPAM 0.5 MG PO TABS: 0.5000 mg | ORAL_TABLET | Freq: Every evening | ORAL | 0 refills | Status: DC | PRN

## 2020-06-08 MED ORDER — ONDANSETRON HCL 8 MG PO TABS: 8.0000 mg | ORAL_TABLET | Freq: Two times a day (BID) | ORAL | 1 refills | Status: DC | PRN

## 2020-06-08 MED ORDER — LIDOCAINE-PRILOCAINE 2.5-2.5 % EX CREA: TOPICAL_CREAM | CUTANEOUS | 3 refills | Status: DC

## 2020-06-08 NOTE — Progress Notes (Signed)
START ON PATHWAY REGIMEN - Breast     A cycle is every 21 days:     Doxorubicin      Cyclophosphamide      Paclitaxel      Carboplatin   **Always confirm dose/schedule in your pharmacy ordering system**    Patient Characteristics: Preoperative or Nonsurgical Candidate (Clinical Staging), Neoadjuvant Therapy followed by Surgery, Invasive Disease, Chemotherapy, HER2 Negative/Unknown/Equivocal, ER Negative/Unknown, Platinum Therapy Indicated Therapeutic Status: Preoperative or Nonsurgical Candidate (Clinical Staging) AJCC M Category: cM0 AJCC Grade: G3 Breast Surgical Plan: Neoadjuvant Therapy followed by Surgery ER Status: Negative (-) AJCC 8 Stage Grouping: IIB HER2 Status: Negative (-) AJCC T Category: cT2 AJCC N Category: cN0 PR Status: Negative (-) Type of Therapy: Platinum Therapy Indicated Intent of Therapy: Curative Intent, Discussed with Patient 

## 2020-06-08 NOTE — Assessment & Plan Note (Signed)
05/26/2020:Patient palpated a left breast mass for 5 months. Mammogram and US showed a 2.7cm mass at the 10:30 position, one left axillary lymph node with cortical thickening, calcifications in the lower inner left breast, and a 0.4cm group of calcifications in the right breast. Biopsy on 05/14/20 showed no malignancy in the right breast, and in the left breast, DCIS, high grade, ER/PR negative. 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   Pathology and radiology counseling: Discussed with the patient, the details of pathology including the type of breast cancer,the clinical staging, the significance of ER, PR and HER-2/neu receptors and the implications for treatment. After reviewing the pathology in detail, we proceeded to discuss the different treatment options between surgery, radiation, chemotherapy, antiestrogen therapies.  Treatment plan: 1.  Breast conserving surgery with targeted node dissection 2. adjuvant chemotherapy with dose dense Adriamycin and Cytoxan x4 followed by Taxol weekly x12 3.  Adjuvant radiation therapy  Chemotherapy Counseling: I discussed the risks and benefits of chemotherapy including the risks of nausea/ vomiting, risk of infection from low WBC count, fatigue due to chemo or anemia, bruising or bleeding due to low platelets, mouth sores, loss/ change in taste and decreased appetite. Liver and kidney function will be monitored through out chemotherapy as abnormalities in liver and kidney function may be a side effect of treatment. Cardiac dysfunction due to Adriamycin was discussed in detail. Risk of permanent bone marrow dysfunction and leukemia due to chemo were also discussed.   Return to clinic after surgery to discuss adjuvant treatment plan.

## 2020-06-08 NOTE — Telephone Encounter (Signed)
Per Dr [name], pt needs echocardiogram before starting chemo 9/1. Pt accepted appt at [facility] 06/09/20 at 3:00 PM. This is more convenient for pt as she lives in [place].

## 2020-06-09 ENCOUNTER — Encounter: Payer: Self-pay | Admitting: *Deleted

## 2020-06-09 ENCOUNTER — Telehealth: Payer: Self-pay | Admitting: Hematology and Oncology

## 2020-06-09 ENCOUNTER — Telehealth: Payer: Self-pay

## 2020-06-09 ENCOUNTER — Telehealth: Payer: Self-pay | Admitting: *Deleted

## 2020-06-09 ENCOUNTER — Ambulatory Visit (HOSPITAL_COMMUNITY)
Admission: RE | Admit: 2020-06-09 | Discharge: 2020-06-09 | Disposition: A | Discharge status: Home / Self Care | Payer: BC Managed Care – PPO | Source: Ambulatory Visit | Attending: Hematology and Oncology | Admitting: Hematology and Oncology

## 2020-06-09 DIAGNOSIS — Z171 Estrogen receptor negative status [ER-]: Secondary | ICD-10-CM | POA: Diagnosis not present

## 2020-06-09 DIAGNOSIS — Z0189 Encounter for other specified special examinations: Secondary | ICD-10-CM

## 2020-06-09 DIAGNOSIS — C50812 Malignant neoplasm of overlapping sites of left female breast: Secondary | ICD-10-CM | POA: Diagnosis not present

## 2020-06-09 LAB — ECHOCARDIOGRAM COMPLETE
AR max vel: 1.95 cm2
AV Area VTI: 1.99 cm2
AV Area mean vel: 2.26 cm2
AV Mean grad: 3.5 mmHg
AV Peak grad: 8.8 mmHg
Ao pk vel: 1.49 m/s
Area-P 1/2: 3.01 cm2
S' Lateral: 1.83 cm

## 2020-06-09 NOTE — Progress Notes (Signed)
Location of Breast Cancer: Left Breast- Invasive Ductal Carcinoma  Did patient present with symptoms (if so, please note symptoms) or was this found on screening mammography?:  Patient palpated a mass in her left breast about 5 months ago.  Mammogram/US 05/07/2020: 2.7cm mass at the 10:30 position, one left axillary lymph node with cortical thickening, calcifications in the lower inner left breast, and a 0.4cm group of calcifications in the right breast.   Histology per Pathology Report: Left Breast 05/17/2020   Receptor Status: ER(0% -), PR (0% -), Her2-neu (-), Ki-67(85%)   Histology per Pathology Report: Left/Right Breast 05/14/2020   Receptor Status: ER(0% -), PR (0% -), Her2-neu (), Ki-()    Past/Anticipated interventions by surgeon, if any:  Past/Anticipated interventions by medical oncology, if any: Chemotherapy  Dr. [name] 06/08/2020 Treatment plan: 1.  Neoadjuvant chemotherapy with Adriamycin Cytoxan Keytruda every 3 weeks x4 followed by Taxol Doristine Church followed by Beryle Flock maintenance 2. Breast conserving surgery with targeted node dissection 2. Adjuvant radiation therapy   Lymphedema issues, if any:  No  Pain issues, if any:  no  SAFETY ISSUES:  Prior radiation? No  Pacemaker/ICD? No  Possible current pregnancy? Postmenopausal  Is the patient on methotrexate? No  Current Complaints / other details:      Cori Razor, RN 06/09/2020,3:20 PM

## 2020-06-09 NOTE — Progress Notes (Signed)
*  PRELIMINARY RESULTS* Echocardiogram 2D Echocardiogram has been performed.  Jo Mills 06/09/2020, 3:53 PM

## 2020-06-09 NOTE — Telephone Encounter (Signed)
Spoke with Altha Harm in [place] about pt having EKG done here in  [facility] while pt was here for chemo education 8/27. Altha Harm says this is fine as long as the EKG is uploaded into Epic. Pt understands she will have EKG while she is in office here that day and then go to surgery center to pick up pre-surgery drink and soap, followed by COVID test.

## 2020-06-09 NOTE — Telephone Encounter (Signed)
Scheduled appts per 8/24 los. Left voicemail with appt dates and times as well as appt date and time for chemo education.

## 2020-06-09 NOTE — Telephone Encounter (Signed)
Spoke with patient to follow up from her new patient appointment and discuss navigation  Resources.  She tells me that she needs afternoon appointments for her chemo and would prefer them on Wednesday.  She is supposed to have port placed 8/31 and her chemo is scheduled for 9/2 due to a booked infusion room.  I have sent a message to try to get this changed and for a schedule message to adjust her appointments.

## 2020-06-10 ENCOUNTER — Telehealth: Payer: Self-pay | Admitting: *Deleted

## 2020-06-10 ENCOUNTER — Encounter: Payer: Self-pay | Admitting: Radiation Oncology

## 2020-06-10 ENCOUNTER — Other Ambulatory Visit: Payer: Self-pay

## 2020-06-10 ENCOUNTER — Other Ambulatory Visit: Payer: Self-pay | Admitting: *Deleted

## 2020-06-10 ENCOUNTER — Ambulatory Visit
Admission: RE | Admit: 2020-06-10 | Discharge: 2020-06-10 | Disposition: A | Discharge status: Home / Self Care | Payer: BC Managed Care – PPO | Source: Ambulatory Visit | Attending: Radiation Oncology | Admitting: Radiation Oncology

## 2020-06-10 VITALS — Ht 66.0 in | Wt 166.0 lb

## 2020-06-10 DIAGNOSIS — Z171 Estrogen receptor negative status [ER-]: Secondary | ICD-10-CM

## 2020-06-10 DIAGNOSIS — C50812 Malignant neoplasm of overlapping sites of left female breast: Secondary | ICD-10-CM

## 2020-06-10 NOTE — Telephone Encounter (Signed)
Spoke with patient to let her know that she will see Dr. Raliegh Ip at [facility] on 9/8 and then receive her subsequent treatments their at [facility]. Patient was very Patent attorney.

## 2020-06-10 NOTE — Telephone Encounter (Signed)
Spoke with patient to let her know that we were able to work out her an appointment for 9/1 for her 1st treatment.  She would still like to have her subsequent treatments at [facility] because it is closer for her.  I have left a message with Lilia Pro the navigator at [facility] to help with the coordination of her appointments.

## 2020-06-10 NOTE — Progress Notes (Signed)
ambul

## 2020-06-10 NOTE — Telephone Encounter (Signed)
Received call from pt stating she would prefer to receive her chemotherapy treatments at [facility].  Pt states it is closer to her home and there is more availability at that location.  Pt also requesting that port remain accessed post insertion for tx the following day.  Pt currently scheduled to begin tx on 06/17/20.  RN will review with MD for further recommendations.

## 2020-06-10 NOTE — Progress Notes (Signed)
Radiation Oncology         [phone] ________________________________  Initial Outpatient Consultation - Conducted via telephone due to current COVID-19 concerns for limiting patient exposure  I spoke with the patient to conduct this consult visit via telephone to spare the patient unnecessary potential exposure in the healthcare setting during the current COVID-19 pandemic. The patient was notified in advance and was offered a [place] meeting to allow for face to face communication but unfortunately reported that they did not have the appropriate resources/technology to support such a visit and instead preferred to proceed with a telephone consult.   Name: Jo Mills        MRN: [id number]  Date of Service: 06/10/2020 DOB: 1951-06-01  BM:ZTAEWY, Jo Purser, MD  Donnie Mesa, MD     REFERRING PHYSICIAN: Donnie Mesa, MD   DIAGNOSIS: The encounter diagnosis was Malignant neoplasm of overlapping sites of left breast in female, estrogen receptor negative ([place]).   HISTORY OF PRESENT ILLNESS: Jo Mills is a 69 y.o. female with a new diagnosis of left breast cancer. The patient was noted to have a palpable mass in the left breast about 5 months ago.  Diagnostic imaging revealed a mass at the 10:30 position in the left breast measuring 2.7 x 2.6 x 2.5 cm.  In the left axilla single lymph node was mildly thickened measuring 4 Mills.  A biopsy on 05/14/2020 in the upper inner quadrant revealed high-grade DCIS with calcifications and necrosis, a mid or posterior depth biopsy was also obtained consistent with fibroadenomatous nodule and calcifications.  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  She met with Dr. [name] and with Dr. [name], recommendations have been made for  neoadjuvant chemotherapy to begin with, followed by surgery and adjuvant radiotherapy.  She is contacted today to discuss these treatment options.   PREVIOUS RADIATION THERAPY: No   PAST MEDICAL HISTORY:  Past Medical History:  Diagnosis Date  . Cancer ([place])   . GERD (gastroesophageal reflux disease)   . Hypertension        PAST SURGICAL HISTORY: Past Surgical History:  Procedure Laterality Date  . COLONOSCOPY       FAMILY HISTORY: History reviewed. No pertinent family history.   SOCIAL HISTORY:  reports that she has never smoked. She has never used smokeless tobacco. She reports previous alcohol use. She reports that she does not use drugs. The patient is married and lives in [place], [place], [place] of [place]. She is a high school educator   ALLERGIES: Grapefruit extract, Pseudoephedrine hcl, Pineapple, and Amoxicillin   MEDICATIONS:  Current Outpatient Medications  Medication Sig Dispense Refill  . amLODipine-benazepril (LOTREL) 5-20 MG capsule Take 1 capsule by mouth daily.    . Ascorbic Acid (VITAMIN C) 1000 MG tablet Take 1,000 mg by mouth daily.    Marland Kitchen b complex vitamins capsule Take 1 capsule by mouth daily.    . cholecalciferol (VITAMIN D3) 25 MCG (1000 UNIT) tablet Take 1,000 Units by mouth daily.    Marland Kitchen dexamethasone (DECADRON) 4 MG tablet Take 1 tablet day after chemo and 1 tablet 2 days after chemo with food 16 tablet 1  . fexofenadine (ALLEGRA) 180 MG tablet Take 180 mg by mouth daily.    Marland Kitchen lidocaine-prilocaine (EMLA) cream Apply  to affected area once 30 g 3  . LORazepam (ATIVAN) 0.5 MG tablet Take 1 tablet (0.5 mg total) by mouth at bedtime as needed for sleep. 30 tablet 0  . ondansetron (ZOFRAN) 8 MG tablet Take 1 tablet (8 mg total) by mouth 2 (two) times daily as needed. Start on the third day after chemotherapy. 30 tablet 1  . prochlorperazine (COMPAZINE) 10 MG tablet Take 1 tablet (10 mg total) by mouth every 6 (six) hours as needed (Nausea or vomiting). 30  tablet 1  . zinc gluconate 50 MG tablet Take 50 mg by mouth daily.     No current facility-administered medications for this encounter.     REVIEW OF SYSTEMS: On review of systems, the patient reports that she is doing well overall. She does not have any specific breast complaints.    PHYSICAL EXAM:  Unable to assess due to encounter type.    ECOG = 1  0 - Asymptomatic (Fully active, able to carry on all predisease activities without restriction)  1 - Symptomatic but completely ambulatory (Restricted in physically strenuous activity but ambulatory and able to carry out work of a light or sedentary nature. For example, light housework, office work)  2 - Symptomatic, <50% in bed during the day (Ambulatory and capable of all self care but unable to carry out any work activities. Up and about more than 50% of waking hours)  3 - Symptomatic, >50% in bed, but not bedbound (Capable of only limited self-care, confined to bed or chair 50% or more of waking hours)  4 - Bedbound (Completely disabled. Cannot carry on any self-care. Totally confined to bed or chair)  5 - Death   Jo Mills, Jo Mills, Jo Mills, et al. [phone]). "Toxicity and response criteria of the [facility]". [employer] 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 Additional biopsies in the right breast and of a left axillary lymph node demonstrated benign findings. LABS:  None performed density. EXAM: BILATERAL BREAST MRI WITH AND WITHOUT CONTRAST TECHNIQUE: Multiplanar,  multisequence MR images of both breasts were obtained prior to and following the intravenous administration of 8 ml of Gadavist. Three-dimensional MR images were rendered by post-processing of the original MR data on an independent workstation. The three-dimensional MR images were interpreted, and findings are reported in the following complete MRI report for this study. Three dimensional images were evaluated at the independent interpreting workstation using the DynaCAD thin client. COMPARISON:  Previous exam(s). FINDINGS: Breast composition: a. Almost entirely fat. Background parenchymal enhancement: Mild. Right breast: No suspicious mass or abnormal enhancement. Susceptibility artifact from post biopsy clip is demonstrated in the upper inner quadrant at middle depth. This is consistent with a site of benign biopsy. Left breast: Post biopsy related changes are seen in association with a spiculated, heterogeneously enhancing mass in the upper inner left breast at middle depth (series 5, image 56/144). The mass measures 3.8 x 3.3 x 3.2 cm. An additional area of a post biopsy related changes is demonstrated in the upper  inner quadrant at anterior depth (series 5, image 293/144). This is consistent with the site of biopsied calcifications demonstrating high-grade DCIS. No additional suspicious findings in the remainder of the left breast.). Lymph nodes: No abnormal appearing lymph nodes. Ancillary findings:  None. IMPRESSION: 1. 3.8 cm enhancing mass in the upper inner left breast consistent with the patient's biopsy-proven site of invasive malignancy. 2. 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 3. No MRI evidence of malignancy on the right. 4. No suspicious lymphadenopathy. RECOMMENDATION: Per clinical treatment plan. BI-RADS CATEGORY  6: Known biopsy-proven malignancy. Electronically Signed   By: Kristopher Oppenheim M.D.   On: 06/07/2020 11:33   ECHOCARDIOGRAM COMPLETE  Result Date:  06/09/2020    ECHOCARDIOGRAM REPORT   Patient Name:   LASHINA MILLES Date of Exam: 06/09/2020 Medical Rec #:  [id number]    Height:       66.0 in Accession #:    [id number]   Weight:       166.0 lb Date of Birth:  17-Jan-1951   BSA:          1.848 m Patient Age:    33 years     BP:           135/83 mmHg Patient Gender: F            HR:           67 bpm. Exam Location:  [facility] Procedure: 2D Echo Indications:    Chemo V67.2 / Z09  History:        Patient has no prior history of Echocardiogram examinations.                 Risk Factors:Non-Smoker and Hypertension. Breast Cancer.  Sonographer:    Leavy Cella RDCS (AE) Referring Phys: [id number] Nicholas Lose IMPRESSIONS  1. Left ventricular ejection fraction, by estimation, is 60 to 65%. The left ventricle has normal function. The left ventricle has no regional wall motion abnormalities. There is mild left ventricular hypertrophy. Left ventricular diastolic parameters are consistent with Grade I diastolic dysfunction (impaired relaxation).  2. Right ventricular systolic function is normal. The right ventricular size is normal. There is normal pulmonary artery systolic pressure.  3. The mitral valve is normal in structure. No evidence of mitral valve regurgitation. No evidence of mitral stenosis.  4. The aortic valve has an indeterminant number of cusps. Aortic valve regurgitation is not visualized. No aortic stenosis is present.  5. The inferior vena cava is normal in size with greater than 50% respiratory variability, suggesting right atrial pressure of 3 mmHg. FINDINGS  Left Ventricle: Left ventricular ejection fraction, by estimation, is 60 to 65%. The left ventricle has normal function. The left ventricle has no regional wall motion abnormalities. The left ventricular internal cavity size was normal in size. There is  mild left ventricular hypertrophy. Left ventricular diastolic parameters are consistent with Grade I diastolic dysfunction (impaired relaxation).  Right Ventricle: The right ventricular size is normal. No increase in right ventricular wall thickness. Right ventricular systolic function is normal. There is normal pulmonary artery systolic pressure. The tricuspid regurgitant velocity is 2.14 m/s, and  with an assumed right atrial pressure of 10 mmHg, the estimated right ventricular systolic pressure is 97.9 mmHg. Left Atrium: Left atrial size was normal in size. Right Atrium: Right atrial size was normal in size. Pericardium: There is no evidence of pericardial effusion. Mitral Valve: The mitral valve is normal in structure. No evidence of mitral  valve regurgitation. No evidence of mitral valve stenosis. Tricuspid Valve: The tricuspid valve is normal in structure. Tricuspid valve regurgitation is not demonstrated. No evidence of tricuspid stenosis. Aortic Valve: The aortic valve has an indeterminant number of cusps. Aortic valve regurgitation is not visualized. No aortic stenosis is present. Aortic valve mean gradient measures 3.5 mmHg. Aortic valve peak gradient measures 8.8 mmHg. Aortic valve area, by VTI measures 1.99 cm. Pulmonic Valve: The pulmonic valve was not well visualized. Pulmonic valve regurgitation is not visualized. No evidence of pulmonic stenosis. Aorta: The aortic root is normal in size and structure. Venous: The inferior vena cava is normal in size with greater than 50% respiratory variability, suggesting right atrial pressure of 3 mmHg. IAS/Shunts: No atrial level shunt detected by color flow Doppler.  LEFT VENTRICLE PLAX 2D LVIDd:         3.99 cm  Diastology LVIDs:         1.83 cm  LV e' lateral:   7.72 cm/s LV PW:         1.10 cm  LV E/e' lateral: 8.6 LV IVS:        1.06 cm  LV e' medial:    5.00 cm/s LVOT diam:     1.90 cm  LV E/e' medial:  13.3 LV SV:         52 LV SV Index:   28 LVOT Area:     2.84 cm  RIGHT VENTRICLE RV S prime:     16.30 cm/s TAPSE (M-mode): 2.7 cm LEFT ATRIUM             Index       RIGHT ATRIUM           Index LA  diam:        3.90 cm 2.11 cm/m  RA Area:     11.10 cm LA Vol (A2C):   40.3 ml 21.81 ml/m RA Volume:   24.80 ml  13.42 ml/m LA Vol (A4C):   29.5 ml 15.97 ml/m LA Biplane Vol: 35.9 ml 19.43 ml/m  AORTIC VALVE AV Area (Vmax):    1.95 cm AV Area (Vmean):   2.26 cm AV Area (VTI):     1.99 cm AV Vmax:           148.55 cm/s AV Vmean:          83.402 cm/s AV VTI:            0.264 m AV Peak Grad:      8.8 mmHg AV Mean Grad:      3.5 mmHg LVOT Vmax:         102.40 cm/s LVOT Vmean:        66.550 cm/s LVOT VTI:          0.185 m LVOT/AV VTI ratio: 0.70  AORTA Ao Root diam: 2.70 cm MITRAL VALVE               TRICUSPID VALVE MV Area (PHT): 3.01 cm    TR Peak grad:   18.3 mmHg MV Decel Time: 252 msec    TR Vmax:        214.00 cm/s MV E velocity: 66.40 cm/s MV A velocity: 75.80 cm/s  SHUNTS MV E/A ratio:  0.88        Systemic VTI:  0.19 m                            Systemic Diam:  1.90 cm Carlyle Dolly MD Electronically signed by Carlyle Dolly MD Signature Date/Time: 06/09/2020/4:03:57 PM    Final    [place] AXILLARY NODE CORE BIOPSY LEFT  Addendum Date: 05/18/2020   ADDENDUM REPORT: 05/18/2020 14:25 ADDENDUM: Pathology revealed GRADE III INVASIVE DUCTAL CARCINOMA of the LEFT breast, 10:30, 10cmfn. This was found to be concordant by Dr. Kristopher Oppenheim. Pathology revealed REACTIVE LYMPH NODE of the LEFT axilla. This was found to be concordant by Dr. Kristopher Oppenheim. Pathology results were discussed with the patient by telephone. The patient reported doing well after the biopsies with tenderness at the sites. Post biopsy instructions and care were reviewed and questions were answered. The patient was encouraged to call [facility] for any additional concerns. Surgical consultation has been arranged with Dr. Donnie Mesa at [facility] on May 28, 2020. Pathology results reported by Stacie Acres RN on 05/18/2020. Electronically Signed   By: Kristopher Oppenheim M.D.   On: 05/18/2020 14:25    Result Date: 05/18/2020 CLINICAL DATA:  69 year old female with a suspicious left breast mass and left axillary lymph node. Recent stereotactic biopsies of the bilateral breast demonstrated high-grade DCIS on the left and benign findings on the right. EXAM: ULTRASOUND GUIDED LEFT BREAST CORE NEEDLE BIOPSY ULTRASOUND-GUIDED LEFT AXILLARY CORE NEEDLE BIOPSY COMPARISON:  Previous exam(s). PROCEDURE: I met with the patient and we discussed the procedure of ultrasound-guided biopsy, including benefits and alternatives. We discussed the high likelihood of a successful procedure. We discussed the risks of the procedure, including infection, bleeding, tissue injury, clip migration, and inadequate sampling. Informed written consent was given. The usual time-out protocol was performed immediately prior to the procedure. Lesion quadrant: Upper inner quadrant Using sterile technique and 1% Lidocaine as local anesthetic, under direct ultrasound visualization, a 12 gauge spring-loaded device was used to perform biopsy of a mass at the 10:30 position using a inferior approach. At the conclusion of the procedure ribbon shaped tissue marker clip was deployed into the biopsy cavity. Using sterile technique and 1% Lidocaine as local anesthetic, under direct ultrasound visualization, a 14 gauge spring-loaded device was used to perform biopsy of a left axillary lymph node using a inferior approach. At the conclusion of the procedure heart shaped tissue marker clip was deployed into the biopsy cavity. The patient reports a nickel allergy. Therefore, the Q shaped clip could not be used. HydroMARK clips were not available on the day of biopsy. A titanium, heart shaped clip was therefore deployed. Follow up 2 view mammogram was performed and dictated separately. IMPRESSION: Ultrasound guided biopsy of the left breast and axilla. No apparent complications. Electronically Signed: By: Kristopher Oppenheim M.D. On: 05/17/2020 15:54   Mills CLIP  PLACEMENT LEFT  Result Date: 05/17/2020 CLINICAL DATA:  69 year old female status post ultrasound-guided biopsy of the left breast and axilla. EXAM: DIAGNOSTIC LEFT MAMMOGRAM POST ULTRASOUND BIOPSY COMPARISON:  Previous exam(s). FINDINGS: Mammographic images were obtained following ultrasound guided biopsy of the left breast and axilla. A ribbon shaped clip is identified within the hyperdense mass in the upper inner quadrant of the left breast. A heart shaped clip is identified within a left axillary lymph node. IMPRESSION: 1. Appropriate positioning of both post biopsy marking clips at the sites of biopsy in the upper inner left breast and left axilla. 2. Please note, the Q-shaped clip was not placed in the left axillary lymph node due to the patient's nickel allergy. A titanium heart shaped clip was placed instead. Final Assessment: Post  Procedure Mammograms for Marker Placement Electronically Signed   By: Kristopher Oppenheim M.D.   On: 05/17/2020 16:03   Mills CLIP PLACEMENT LEFT  Result Date: 05/14/2020 CLINICAL DATA:  Confirmation of clip placement after stereotactic tomosynthesis core needle biopsy of in indeterminate 0.4 cm group calcifications in the UPPER RIGHT breast and stereotactic tomosynthesis core needle biopsy of suspicious calcifications involving the UPPER INNER LEFT breast. EXAM: 2D AND TOMOSYNTHESIS DIAGNOSTIC BILATERAL MAMMOGRAM POST STEREOTACTIC BIOPSY x 2 COMPARISON:  Previous exam(s). FINDINGS: Tomosynthesis and synthesized full field CC and mediolateral images of both breasts were obtained following stereotactic tomosynthesis guided biopsy of calcifications involving the INNER LEFT breast at ANTERIOR depth and calcifications involving the UPPER RIGHT breast at MIDDLE depth. The coil shaped tissue marking clip is appropriately positioned at the site of the biopsied calcifications in the INNER LEFT breast at ANTERIOR depth. The clip is at the ANTERIOR margin of the biopsied calcifications.  Residual calcifications extend approximately 2 cm POSTERIOR to the clip. The coil shaped tissue marker clip is appropriately positioned at the site of the biopsied calcifications in the UPPER RIGHT breast at MIDDLE depth. All of the calcifications were removed with the biopsy. Expected post biopsy changes are present at both sites without evidence of hematoma. IMPRESSION: 1. Appropriate positioning of the coil shaped tissue marking clip at the site of the biopsied calcifications in the INNER LEFT breast at ANTERIOR depth. Residual calcifications extend approximately 2 cm POSTERIOR to the clip. 2. Appropriate positioning of the coil shaped tissue marking clip at the site of the biopsied calcifications in the UPPER RIGHT breast at MIDDLE depth. All of the calcifications were removed with the biopsy. Final Assessment: Post Procedure Mammograms for Marker Placement Electronically Signed   By: Evangeline Dakin M.D.   On: 05/14/2020 10:38   Mills CLIP PLACEMENT RIGHT  Result Date: 05/14/2020 CLINICAL DATA:  Confirmation of clip placement after stereotactic tomosynthesis core needle biopsy of in indeterminate 0.4 cm group calcifications in the UPPER RIGHT breast and stereotactic tomosynthesis core needle biopsy of suspicious calcifications involving the UPPER INNER LEFT breast. EXAM: 2D AND TOMOSYNTHESIS DIAGNOSTIC BILATERAL MAMMOGRAM POST STEREOTACTIC BIOPSY x 2 COMPARISON:  Previous exam(s). FINDINGS: Tomosynthesis and synthesized full field CC and mediolateral images of both breasts were obtained following stereotactic tomosynthesis guided biopsy of calcifications involving the INNER LEFT breast at ANTERIOR depth and calcifications involving the UPPER RIGHT breast at MIDDLE depth. The coil shaped tissue marking clip is appropriately positioned at the site of the biopsied calcifications in the INNER LEFT breast at ANTERIOR depth. The clip is at the ANTERIOR margin of the biopsied calcifications. Residual calcifications  extend approximately 2 cm POSTERIOR to the clip. The coil shaped tissue marker clip is appropriately positioned at the site of the biopsied calcifications in the UPPER RIGHT breast at MIDDLE depth. All of the calcifications were removed with the biopsy. Expected post biopsy changes are present at both sites without evidence of hematoma. IMPRESSION: 1. Appropriate positioning of the coil shaped tissue marking clip at the site of the biopsied calcifications in the INNER LEFT breast at ANTERIOR depth. Residual calcifications extend approximately 2 cm POSTERIOR to the clip. 2. Appropriate positioning of the coil shaped tissue marking clip at the site of the biopsied calcifications in the UPPER RIGHT breast at MIDDLE depth. All of the calcifications were removed with the biopsy. Final Assessment: Post Procedure Mammograms for Marker Placement Electronically Signed   By: Evangeline Dakin M.D.   On: 05/14/2020 10:38  Mills LT BREAST BX W LOC DEV 1ST LESION IMAGE BX SPEC STEREO GUIDE  Addendum Date: 05/18/2020   ADDENDUM REPORT: 05/17/2020 15:35 ADDENDUM: Pathology revealed HIGH GRADE DUCTAL CARCINOMA IN SITU WITH CALCIFICATIONS AND NECROSIS of the LEFT breast, upper inner quadrant anterior depth. This was found to be concordant byDr. Peggye Fothergill. 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 This was found to be concordant by Dr. Peggye Fothergill. Pathology results were discussed with the patient in person by Dr. Kristopher Oppenheim. The patient reported doing well after the biopsies with tenderness at the sites. Post biopsy instructions and care were reviewed and questions were answered. The patient was encouraged to call [facility] for any additional concerns. The patient is scheduled for additional LEFT breast biopsy and LEFT axillary node biopsy on May 17, 2020. Further recommendations will be guided by these results. Due to  High Grade histology, Breast MRI is recommended to determine extent of disease. Pathology results reported by Stacie Acres RN on 05/17/2020. Electronically Signed   By: Evangeline Dakin M.D.   On: 05/17/2020 15:35   Result Date: 05/18/2020 CLINICAL DATA:  69 year old who presented with a palpable 2.7 cm mass involving the UPPER INNER QUADRANT of the LEFT breast, shown on diagnostic workup to have an indeterminate 0.4 cm group of calcifications associated with a focal asymmetry in the UPPER RIGHT breast at MIDDLE to POSTERIOR depth, and a suspicious 1.0 cm group of calcifications in the INNER LEFT breast at ANTERIOR depth. Stereotactic biopsy the calcifications in both breasts is performed. The patient is scheduled for ultrasound-guided core needle biopsy of the palpable LEFT breast mass and a solitary indeterminate LEFT axillary lymph node on Monday, August 2. EXAM: # 1) LEFT BREAST STEREOTACTIC CORE NEEDLE BIOPSY # 2) RIGHT BREAST STEREOTACTIC CORE NEEDLE BIOPSY COMPARISON:  Previous exams. FINDINGS: The patient and I discussed the procedure of stereotactic-guided biopsy including benefits and alternatives. We discussed the high likelihood of a successful procedure. We discussed the risks of the procedure including infection, bleeding, tissue injury, clip migration, and inadequate sampling. Informed written consent was given. The usual time out protocol was performed immediately prior to the procedure. # 1) LEFT breast, lesion quadrant: INNER breast, slight UPPER INNER QUADRANT. Using sterile technique with chlorhexidine as skin antisepsis, 1% lidocaine and 1% lidocaine with epinephrine as local anesthetic, under stereotactic guidance, a 9 gauge Brevera vacuum assisted device was used to perform core needle biopsy of calcifications involving the INNER LEFT breast using a superior approach. Specimen radiograph was performed showing calcifications in at least 3 of the core samples. Specimens with calcifications are  identified for pathology. At the conclusion of the procedure, a coil shaped tissue marker clip was deployed into the biopsy cavity. # 2) RIGHT breast, lesion quadrant: UPPER breast, slight UPPER INNER QUADRANT. Using sterile technique with chlorhexidine as skin antisepsis, 1% lidocaine and 1% lidocaine with epinephrine as local anesthetic, under stereotactic guidance, a 9 gauge Brevera vacuum assisted device was used to perform core biopsy of calcifications involving the UPPER RIGHT breast using a superior approach. Specimen radiograph was performed showing essentially all of the calcifications in the initial core sample. Specimens with calcifications are identified for pathology. At the conclusion of the procedure, a coil shaped tissue marker clip was deployed into the biopsy cavity. Follow-up 2-view mammogram was performed and dictated separately. IMPRESSION: 1. Stereotactic guided biopsy of calcifications involving the INNER LEFT breast at ANTERIOR depth. 2. Stereotactic  guided biopsy of calcifications involving the UPPER RIGHT breast at MIDDLE depth. 3. No apparent complications. Electronically Signed: By: Evangeline Dakin M.D. 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 This was found to be concordant by Dr. Kristopher Oppenheim. Pathology revealed REACTIVE LYMPH NODE of the LEFT axilla. This was found to be concordant by Dr. Kristopher Oppenheim. Pathology results were discussed with the patient by telephone. The patient reported doing well after the biopsies with tenderness at the sites. Post biopsy instructions and care were reviewed and questions were answered. The patient was encouraged to call [facility] for any additional concerns. Surgical consultation has been arranged with Dr. Donnie Mesa at [facility] on May 28, 2020. Pathology results reported by Stacie Acres RN on 05/18/2020. Electronically Signed   By: Kristopher Oppenheim M.D.   On: 05/18/2020 14:25   Result Date: 05/18/2020 CLINICAL DATA:  69 year old female with a suspicious left breast mass and left axillary lymph node. Recent stereotactic biopsies of the bilateral breast demonstrated high-grade DCIS on the left and benign findings on the right. EXAM: ULTRASOUND GUIDED LEFT BREAST CORE NEEDLE BIOPSY ULTRASOUND-GUIDED LEFT AXILLARY CORE NEEDLE BIOPSY COMPARISON:  Previous exam(s). PROCEDURE: I met with the patient and we discussed the procedure of ultrasound-guided biopsy, including benefits and alternatives. We discussed the high likelihood of a successful procedure. We discussed the risks of the procedure, including infection, bleeding, tissue injury, clip migration, and inadequate sampling. Informed written consent was given. The usual time-out protocol was performed immediately prior to the procedure. Lesion quadrant: Upper inner quadrant Using sterile technique and 1% Lidocaine as local anesthetic, under direct ultrasound visualization, a 12 gauge spring-loaded device was used to perform biopsy of a mass at the 10:30 position using a inferior approach. At the conclusion of the procedure ribbon shaped tissue marker clip was deployed into the biopsy cavity. Using sterile technique and 1% Lidocaine as local anesthetic, under direct ultrasound visualization, a 14 gauge spring-loaded device was used to perform biopsy of a left axillary lymph node using a inferior approach. At the conclusion of the procedure heart shaped tissue marker clip was deployed into the biopsy cavity. The patient reports a nickel allergy. Therefore, the Q shaped clip could not be used. HydroMARK clips were not available on the day of biopsy. A titanium, heart shaped clip was therefore deployed. Follow up 2 view mammogram was performed and dictated  separately. IMPRESSION: Ultrasound guided biopsy of the left breast and axilla. No apparent complications. Electronically Signed: By: Kristopher Oppenheim M.D. On: 05/17/2020 15:54   [place] RT BREAST BX W LOC DEV 1ST LESION IMAGE BX SPEC STEREO GUIDE  Addendum Date: 05/18/2020   ADDENDUM REPORT: 05/17/2020 15:35 ADDENDUM: Pathology revealed HIGH GRADE DUCTAL CARCINOMA IN SITU WITH CALCIFICATIONS AND NECROSIS of the LEFT breast, upper inner quadrant anterior depth. This was found to be concordant byDr. Peggye Fothergill. 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 This was found to be concordant by Dr. Peggye Fothergill. Pathology results were discussed with the patient in person by Dr. Kristopher Oppenheim. The patient reported doing well after the biopsies with tenderness at the sites. Post biopsy instructions and care were reviewed and questions were answered. The patient was encouraged to call [facility] for any additional concerns. The patient is scheduled for additional LEFT breast biopsy and LEFT axillary node biopsy on May 17, 2020. Further recommendations will be guided by these results. Due to High Grade histology, Breast MRI is recommended to determine extent of disease. Pathology results reported by Stacie Acres RN on 05/17/2020. Electronically Signed   By: Evangeline Dakin M.D.   On: 05/17/2020 15:35   Result Date: 05/18/2020 CLINICAL DATA:  70 year old who presented with a palpable 2.7 cm mass involving the UPPER INNER QUADRANT of the LEFT breast, shown on diagnostic workup to have an indeterminate 0.4 cm group of calcifications associated with a focal asymmetry in the UPPER RIGHT breast at MIDDLE to POSTERIOR depth, and a suspicious 1.0 cm group of calcifications in the INNER LEFT breast at ANTERIOR depth. Stereotactic biopsy the calcifications in both breasts is performed. The patient is scheduled for ultrasound-guided core  needle biopsy of the palpable LEFT breast mass and a solitary indeterminate LEFT axillary lymph node on Monday, August 2. EXAM: # 1) LEFT BREAST STEREOTACTIC CORE NEEDLE BIOPSY # 2) RIGHT BREAST STEREOTACTIC CORE NEEDLE BIOPSY COMPARISON:  Previous exams. FINDINGS: The patient and I discussed the procedure of stereotactic-guided biopsy including benefits and alternatives. We discussed the high likelihood of a successful procedure. We discussed the risks of the procedure including infection, bleeding, tissue injury, clip migration, and inadequate sampling. Informed written consent was given. The usual time out protocol was performed immediately prior to the procedure. # 1) LEFT breast, lesion quadrant: INNER breast, slight UPPER INNER QUADRANT. Using sterile technique with chlorhexidine as skin antisepsis, 1% lidocaine and 1% lidocaine with epinephrine as local anesthetic, under stereotactic guidance, a 9 gauge Brevera vacuum assisted device was used to perform core needle biopsy of calcifications involving the INNER LEFT breast using a superior approach. Specimen radiograph was performed showing calcifications in at least 3 of the core samples. Specimens with calcifications are identified for pathology. At the conclusion of the procedure, a coil shaped tissue marker clip was deployed into the biopsy cavity. # 2) RIGHT breast, lesion quadrant: UPPER breast, slight UPPER INNER QUADRANT. Using sterile technique with chlorhexidine as skin antisepsis, 1% lidocaine and 1% lidocaine with epinephrine as local anesthetic, under stereotactic guidance, a 9 gauge Brevera vacuum assisted device was used to perform core biopsy of calcifications involving the UPPER RIGHT breast using a superior approach. Specimen radiograph was performed showing essentially all of the calcifications in the initial core sample. Specimens with calcifications are identified for pathology. At the conclusion of the procedure, a coil shaped tissue  marker clip was deployed into the biopsy cavity. Follow-up 2-view mammogram was performed and dictated separately. IMPRESSION: 1. Stereotactic guided biopsy of calcifications involving the INNER LEFT breast at ANTERIOR depth. 2. Stereotactic guided biopsy of calcifications involving the UPPER RIGHT breast at MIDDLE depth. 3. No apparent complications. Electronically Signed: By: Evangeline Dakin M.D. On: 05/14/2020 10:34       IMPRESSION/PLAN: 1. Stage IIB, cT2N0M0 grade 3, triple negative invasive ductal carcinoma of the left breast. Dr. Lisbeth Renshaw discusses the pathology findings and reviews the nature of left triple negative breast disease. The consensus from the breast conference includes proceeding with neoadjuvant chemotherapy followed by lumpectomy and targeted node dissection, as well as adjuvant radiotherapy.  Dr. Lisbeth Renshaw discusses the rationale for adjuvant radiation, and the patient is in agreement with this plan.. We discussed the risks, benefits, short, and long term effects of radiotherapy, and the patient is interested  in proceeding. Dr. Lisbeth Renshaw discusses the delivery and logistics of radiotherapy and anticipates a course of 4 - 6 1/2 weeks of radiotherapy to the left breast with deep inspiration breath hold technique. The patient is interested in meeting with radiation treatment closer to home and will be referred to [facility] clinic in [place].  2. Covid Vaccination. The patient is elligible for her third booster and will seek this out closer to home.   Given current concerns for patient exposure during the COVID-19 pandemic, this encounter was conducted via telephone.  The patient has provided two factor identification and has given verbal consent for this type of encounter and has been advised to only accept a meeting of this type in a secure network environment. The time spent during this encounter was 60 minutes including preparation, discussion, and coordination of the patient's care. The  attendants for this meeting include Blenda Nicely, RN, Dr. Lisbeth Renshaw, Hayden Pedro  and Marko Plume.  During the encounter,  Blenda Nicely, RN, Dr. Lisbeth Renshaw, and Hayden Pedro were located at [facility].  Trish Mancinelli was located at home.    The above documentation reflects my direct findings during this shared patient visit. Please see the separate note by Dr. Lisbeth Renshaw on this date for the remainder of the patient's plan of care.    Carola Rhine, PAC

## 2020-06-11 ENCOUNTER — Inpatient Hospital Stay: Payer: BC Managed Care – PPO

## 2020-06-11 ENCOUNTER — Other Ambulatory Visit (HOSPITAL_COMMUNITY)
Admission: RE | Admit: 2020-06-11 | Discharge: 2020-06-11 | Disposition: A | Discharge status: Home / Self Care | Payer: BC Managed Care – PPO | Source: Ambulatory Visit | Attending: Surgery | Admitting: Surgery

## 2020-06-11 ENCOUNTER — Encounter (HOSPITAL_BASED_OUTPATIENT_CLINIC_OR_DEPARTMENT_OTHER)
Admission: RE | Admit: 2020-06-11 | Discharge: 2020-06-11 | Disposition: A | Discharge status: Home / Self Care | Payer: BC Managed Care – PPO | Source: Ambulatory Visit | Attending: Surgery | Admitting: Surgery

## 2020-06-11 ENCOUNTER — Encounter: Payer: Self-pay | Admitting: Licensed Clinical Social Worker

## 2020-06-11 ENCOUNTER — Other Ambulatory Visit: Payer: Self-pay

## 2020-06-11 DIAGNOSIS — Z01812 Encounter for preprocedural laboratory examination: Secondary | ICD-10-CM | POA: Diagnosis not present

## 2020-06-11 DIAGNOSIS — Z20822 Contact with and (suspected) exposure to covid-19: Secondary | ICD-10-CM | POA: Insufficient documentation

## 2020-06-11 DIAGNOSIS — Z01810 Encounter for preprocedural cardiovascular examination: Secondary | ICD-10-CM | POA: Insufficient documentation

## 2020-06-11 LAB — SARS CORONAVIRUS 2 (TAT 6-24 HRS): SARS Coronavirus 2: NEGATIVE

## 2020-06-11 NOTE — Progress Notes (Signed)
      Enhanced Recovery after Surgery for Orthopedics Enhanced Recovery after Surgery is a protocol used to improve the stress on your body and your recovery after surgery.  Patient Instructions  . The night before surgery:  o No food after midnight. ONLY clear liquids after midnight  . The day of surgery (if you do NOT have diabetes):  o Drink ONE (1) Pre-Surgery Clear Ensure as directed.   o This drink was given to you during your hospital  pre-op appointment visit. o The pre-op nurse will instruct you on the time to drink the  Pre-Surgery Ensure depending on your surgery time. o Finish the drink at the designated time by the pre-op nurse.  o Nothing else to drink after completing the  Pre-Surgery Clear Ensure.  . The day of surgery (if you have diabetes): o Drink ONE (1) Gatorade 2 (G2) as directed. o This drink was given to you during your hospital  pre-op appointment visit.  o The pre-op nurse will instruct you on the time to drink the   Gatorade 2 (G2) depending on your surgery time. o Color of the Gatorade may vary. Red is not allowed. o Nothing else to drink after completing the  Gatorade 2 (G2).         If you have questions, please contact your surgeon's office.   Patient given CHG preoperative skin preparation. Patient verbalizes understanding.

## 2020-06-11 NOTE — Progress Notes (Signed)
[facility] Psychosocial Distress Screening Clinical Social Work  Clinical Social Work was referred by distress screening protocol.  The patient scored a 5 on the Psychosocial Distress Thermometer which indicates moderate distress. Clinical Social Worker contacted patient by phone to assess for distress and other psychosocial needs.   Barriers to care/review of distress screen:  - Transportation:  No concerns. Drives & husband can drive her to appts. Moving treatment to [place] to be closer to home - Help at home:  Lives with husband who is primary support. He has had his own health issues (quadruple bypass, dialysis- waiting for kidney) but doing well enough currently to help. - Support system: main support is husband - Finances:  No concerns. Works as Pharmacist, hospital. Has significant amount of vacation time that she can take if needed. Work has been very supportive  What is your understanding of where you are with your cancer? Your treatment plan and what happens next? Feels very good about the plan with neoadjuvant chemo, surgery, radiation. Is taking this time to focus on her health and doing what she needs to get through so she can live a long, healthy life.  What are your worries for the future as you begin treatment for cancer? Only concern is what they will do if husband gets a call that they have a kidney for him while she is in the midst of treatment but will likely be able to manage   What are your hopes and priorities during your treatment? What is important to you? What are your goals for your care? Hopes to be able to continue working as much as possible since she loves her work and it helps physically and mentally. Is going to do everything she can to make herself healthy through treatment so she can be clear of cancer. Relying on her skill to focus on what to do next and on her faith in [place].    ONCBCN DISTRESS SCREENING 06/10/2020  Screening Type Initial Screening  Distress experienced in  past week (1-10) 5  Family Problem type Partner  Emotional problem type Adjusting to illness  Other Contact via home phone   Clinical Social Worker follow up needed: No. CSW provided contact information and information on support services and programs  If yes, follow up plan:  Jonah Nestle, [place], LCSW

## 2020-06-14 ENCOUNTER — Other Ambulatory Visit (HOSPITAL_COMMUNITY): Payer: Self-pay

## 2020-06-14 DIAGNOSIS — Z171 Estrogen receptor negative status [ER-]: Secondary | ICD-10-CM

## 2020-06-14 DIAGNOSIS — C50812 Malignant neoplasm of overlapping sites of left female breast: Secondary | ICD-10-CM

## 2020-06-15 ENCOUNTER — Encounter (HOSPITAL_BASED_OUTPATIENT_CLINIC_OR_DEPARTMENT_OTHER)
Admission: RE | Disposition: A | Discharge status: Home / Self Care | Payer: Self-pay | Source: Home / Self Care | Attending: Surgery

## 2020-06-15 ENCOUNTER — Ambulatory Visit (HOSPITAL_COMMUNITY): Payer: BC Managed Care – PPO

## 2020-06-15 ENCOUNTER — Ambulatory Visit (HOSPITAL_BASED_OUTPATIENT_CLINIC_OR_DEPARTMENT_OTHER): Payer: BC Managed Care – PPO | Admitting: Certified Registered"

## 2020-06-15 ENCOUNTER — Ambulatory Visit (HOSPITAL_BASED_OUTPATIENT_CLINIC_OR_DEPARTMENT_OTHER)
Admission: RE | Admit: 2020-06-15 | Discharge: 2020-06-15 | Disposition: A | Discharge status: Home / Self Care | Payer: BC Managed Care – PPO | Attending: Surgery | Admitting: Surgery

## 2020-06-15 DIAGNOSIS — K5730 Diverticulosis of large intestine without perforation or abscess without bleeding: Secondary | ICD-10-CM | POA: Insufficient documentation

## 2020-06-15 DIAGNOSIS — C50912 Malignant neoplasm of unspecified site of left female breast: Secondary | ICD-10-CM | POA: Diagnosis not present

## 2020-06-15 DIAGNOSIS — Z452 Encounter for adjustment and management of vascular access device: Secondary | ICD-10-CM

## 2020-06-15 DIAGNOSIS — I10 Essential (primary) hypertension: Secondary | ICD-10-CM | POA: Diagnosis not present

## 2020-06-15 DIAGNOSIS — K219 Gastro-esophageal reflux disease without esophagitis: Secondary | ICD-10-CM | POA: Diagnosis not present

## 2020-06-15 DIAGNOSIS — Z79899 Other long term (current) drug therapy: Secondary | ICD-10-CM | POA: Insufficient documentation

## 2020-06-15 DIAGNOSIS — Z419 Encounter for procedure for purposes other than remedying health state, unspecified: Secondary | ICD-10-CM

## 2020-06-15 HISTORY — PX: PORTACATH PLACEMENT: SHX2246

## 2020-06-15 HISTORY — DX: Malignant (primary) neoplasm, unspecified: C80.1

## 2020-06-15 HISTORY — DX: Gastro-esophageal reflux disease without esophagitis: K21.9

## 2020-06-15 HISTORY — DX: Essential (primary) hypertension: I10

## 2020-06-15 IMAGING — CR DG CHEST 1V PORT
1 series · 1 of 1 positions shown · non-contrast
Comparison: Portable exam [TS] hours without priors for comparison

CLINICAL DATA: Port-A-Cath placement

EXAM:
PORTABLE CHEST 1 VIEW

[chest ap]
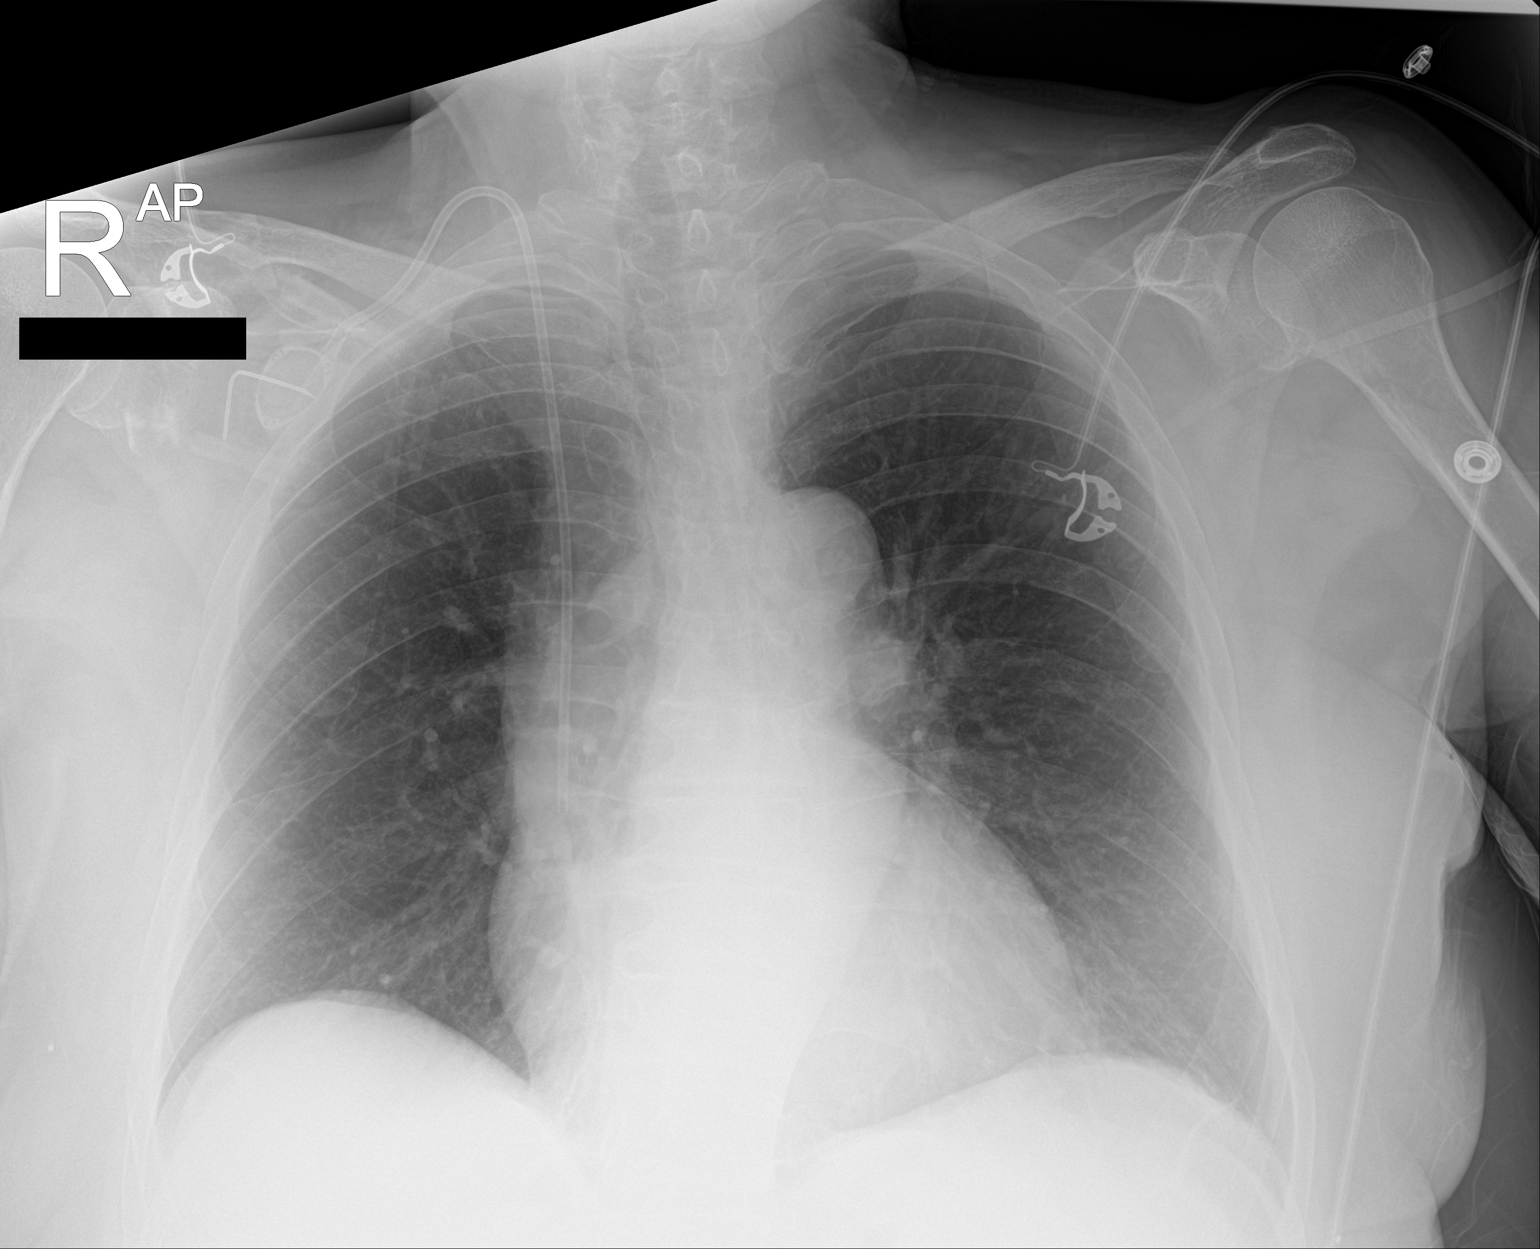

[1 of 1 positions shown; findings below may reference images not displayed]

FINDINGS: RIGHT jugular Port-A-Cath with tip projecting over SVC.

Normal heart size, mediastinal contours, and pulmonary vascularity.

Lungs clear.

No acute infiltrate, pleural effusion or pneumothorax.

Bones demineralized.
IMPRESSION: No pneumothorax following RIGHT jugular Port-A-Cath insertion.

## 2020-06-15 SURGERY — INSERTION, TUNNELED CENTRAL VENOUS DEVICE, WITH PORT
Anesthesia: General | Site: Chest

## 2020-06-15 MED ORDER — SCOPOLAMINE 1 MG/3DAYS TD PT72
1.0000 | MEDICATED_PATCH | TRANSDERMAL | Status: DC
  Administered 2020-06-15: 1.5 mg via TRANSDERMAL

## 2020-06-15 MED ORDER — CHLORHEXIDINE GLUCONATE CLOTH 2 % EX PADS: 6.0000 | MEDICATED_PAD | Freq: Once | CUTANEOUS | Status: DC

## 2020-06-15 MED ORDER — SCOPOLAMINE 1 MG/3DAYS TD PT72
MEDICATED_PATCH | TRANSDERMAL | Status: AC
  Filled 2020-06-15: qty 1

## 2020-06-15 MED ORDER — MIDAZOLAM HCL 2 MG/2ML IJ SOLN
INTRAMUSCULAR | Status: AC
  Filled 2020-06-15: qty 2

## 2020-06-15 MED ORDER — DEXAMETHASONE SODIUM PHOSPHATE 10 MG/ML IJ SOLN
INTRAMUSCULAR | Status: AC
  Filled 2020-06-15: qty 1

## 2020-06-15 MED ORDER — ONDANSETRON HCL 4 MG/2ML IJ SOLN
INTRAMUSCULAR | Status: AC
  Filled 2020-06-15: qty 2

## 2020-06-15 MED ORDER — LIDOCAINE 2% (20 MG/ML) 5 ML SYRINGE
INTRAMUSCULAR | Status: AC
  Filled 2020-06-15: qty 5

## 2020-06-15 MED ORDER — LIDOCAINE HCL (CARDIAC) PF 100 MG/5ML IV SOSY
PREFILLED_SYRINGE | INTRAVENOUS | Status: DC | PRN
  Administered 2020-06-15: 20 mg via INTRAVENOUS

## 2020-06-15 MED ORDER — HEPARIN (PORCINE) IN NACL 1000-0.9 UT/500ML-% IV SOLN
INTRAVENOUS | Status: AC
  Filled 2020-06-15: qty 500

## 2020-06-15 MED ORDER — CEFAZOLIN SODIUM-DEXTROSE 2-4 GM/100ML-% IV SOLN
2.0000 g | INTRAVENOUS | Status: AC
  Administered 2020-06-15: 2 g via INTRAVENOUS

## 2020-06-15 MED ORDER — MIDAZOLAM HCL 5 MG/5ML IJ SOLN
INTRAMUSCULAR | Status: DC | PRN
  Administered 2020-06-15: 2 mg via INTRAVENOUS

## 2020-06-15 MED ORDER — PROPOFOL 10 MG/ML IV BOLUS
INTRAVENOUS | Status: AC
  Filled 2020-06-15: qty 20

## 2020-06-15 MED ORDER — LACTATED RINGERS IV SOLN: INTRAVENOUS | Status: DC

## 2020-06-15 MED ORDER — ACETAMINOPHEN 500 MG PO TABS
1000.0000 mg | ORAL_TABLET | ORAL | Status: AC
  Administered 2020-06-15: 1000 mg via ORAL

## 2020-06-15 MED ORDER — HEPARIN SOD (PORK) LOCK FLUSH 100 UNIT/ML IV SOLN
INTRAVENOUS | Status: DC | PRN
  Administered 2020-06-15: 500 [IU] via INTRAVENOUS

## 2020-06-15 MED ORDER — HEPARIN (PORCINE) IN NACL 2-0.9 UNITS/ML
INTRAMUSCULAR | Status: AC | PRN
  Administered 2020-06-15: 1 via INTRAVENOUS

## 2020-06-15 MED ORDER — ACETAMINOPHEN 500 MG PO TABS
ORAL_TABLET | ORAL | Status: AC
  Filled 2020-06-15: qty 2

## 2020-06-15 MED ORDER — FENTANYL CITRATE (PF) 100 MCG/2ML IJ SOLN
INTRAMUSCULAR | Status: AC
  Filled 2020-06-15: qty 2

## 2020-06-15 MED ORDER — ACETAMINOPHEN 500 MG PO TABS
1000.0000 mg | ORAL_TABLET | Freq: Once | ORAL | Status: AC
  Administered 2020-06-15: 1000 mg via ORAL

## 2020-06-15 MED ORDER — CEFAZOLIN SODIUM-DEXTROSE 2-4 GM/100ML-% IV SOLN
INTRAVENOUS | Status: AC
  Filled 2020-06-15: qty 100

## 2020-06-15 MED ORDER — BUPIVACAINE HCL (PF) 0.25 % IJ SOLN
INTRAMUSCULAR | Status: DC | PRN
  Administered 2020-06-15: 10 mL

## 2020-06-15 MED ORDER — PROPOFOL 10 MG/ML IV BOLUS
INTRAVENOUS | Status: DC | PRN
  Administered 2020-06-15: 150 mg via INTRAVENOUS

## 2020-06-15 MED ORDER — FENTANYL CITRATE (PF) 100 MCG/2ML IJ SOLN
INTRAMUSCULAR | Status: DC | PRN
  Administered 2020-06-15: 25 ug via INTRAVENOUS

## 2020-06-15 MED ORDER — EPHEDRINE SULFATE 50 MG/ML IJ SOLN
INTRAMUSCULAR | Status: DC | PRN
  Administered 2020-06-15 (×2): 5 mg via INTRAVENOUS
  Administered 2020-06-15 (×2): 10 mg via INTRAVENOUS

## 2020-06-15 MED ORDER — DEXAMETHASONE SODIUM PHOSPHATE 10 MG/ML IJ SOLN
INTRAMUSCULAR | Status: DC | PRN
  Administered 2020-06-15: 5 mg via INTRAVENOUS

## 2020-06-15 MED ORDER — ONDANSETRON HCL 4 MG/2ML IJ SOLN
INTRAMUSCULAR | Status: DC | PRN
  Administered 2020-06-15: 4 mg via INTRAVENOUS

## 2020-06-15 MED ORDER — HEPARIN SOD (PORK) LOCK FLUSH 100 UNIT/ML IV SOLN
INTRAVENOUS | Status: AC
  Filled 2020-06-15: qty 5

## 2020-06-15 MED ORDER — SODIUM BICARBONATE 4.2 % IV SOLN
INTRAVENOUS | Status: AC
  Filled 2020-06-15: qty 10

## 2020-06-15 SURGICAL SUPPLY — 54 items
APL PRP STRL LF DISP 70% ISPRP (MISCELLANEOUS) ×1
APL SKNCLS STERI-STRIP NONHPOA (GAUZE/BANDAGES/DRESSINGS) ×1
BAG DECANTER FOR FLEXI CONT (MISCELLANEOUS) ×2 IMPLANT
BENZOIN TINCTURE PRP APPL 2/3 (GAUZE/BANDAGES/DRESSINGS) ×2 IMPLANT
BLADE SURG 11 STRL SS (BLADE) ×2 IMPLANT
BLADE SURG 15 STRL LF DISP TIS (BLADE) ×1 IMPLANT
BLADE SURG 15 STRL SS (BLADE) ×2
CANISTER SUCT 1200ML W/VALVE (MISCELLANEOUS) IMPLANT
CHLORAPREP W/TINT 26 (MISCELLANEOUS) ×2 IMPLANT
CLEANER CAUTERY TIP 5X5 PAD (MISCELLANEOUS) ×1 IMPLANT
COVER BACK TABLE 60X90IN (DRAPES) ×2 IMPLANT
COVER MAYO STAND STRL (DRAPES) ×2 IMPLANT
COVER PROBE 5X48 (MISCELLANEOUS)
COVER WAND RF STERILE (DRAPES) IMPLANT
DECANTER SPIKE VIAL GLASS SM (MISCELLANEOUS) ×2 IMPLANT
DRAPE C-ARM 42X72 X-RAY (DRAPES) ×2 IMPLANT
DRAPE LAPAROTOMY TRNSV 102X78 (DRAPES) ×2 IMPLANT
DRAPE UTILITY XL STRL (DRAPES) ×2 IMPLANT
DRSG TEGADERM 4X4.75 (GAUZE/BANDAGES/DRESSINGS) ×2 IMPLANT
ELECT REM PT RETURN 9FT ADLT (ELECTROSURGICAL) ×2
ELECTRODE REM PT RTRN 9FT ADLT (ELECTROSURGICAL) ×1 IMPLANT
GAUZE SPONGE 4X4 12PLY STRL LF (GAUZE/BANDAGES/DRESSINGS) IMPLANT
GLOVE BIO SURGEON STRL SZ7 (GLOVE) ×2 IMPLANT
GLOVE BIOGEL PI IND STRL 6.5 (GLOVE) ×1 IMPLANT
GLOVE BIOGEL PI IND STRL 7.0 (GLOVE) ×1 IMPLANT
GLOVE BIOGEL PI IND STRL 7.5 (GLOVE) ×1 IMPLANT
GLOVE BIOGEL PI INDICATOR 6.5 (GLOVE) ×1
GLOVE BIOGEL PI INDICATOR 7.0 (GLOVE) ×1
GLOVE BIOGEL PI INDICATOR 7.5 (GLOVE) ×1
GLOVE SURG SS PI 7.0 STRL IVOR (GLOVE) ×2 IMPLANT
GOWN STRL REUS W/ TWL LRG LVL3 (GOWN DISPOSABLE) ×1 IMPLANT
GOWN STRL REUS W/TWL LRG LVL3 (GOWN DISPOSABLE) ×2
IV KIT MINILOC 20X1 SAFETY (NEEDLE) IMPLANT
KIT CVR 48X5XPRB PLUP LF (MISCELLANEOUS) IMPLANT
KIT PORT POWER 8FR ISP CVUE (Port) ×2 IMPLANT
NDL SAFETY ECLIPSE 18X1.5 (NEEDLE) IMPLANT
NEEDLE HYPO 18GX1.5 SHARP (NEEDLE)
NEEDLE HYPO 25X1 1.5 SAFETY (NEEDLE) ×2 IMPLANT
NEEDLE SPNL 22GX3.5 QUINCKE BK (NEEDLE) IMPLANT
PACK BASIN DAY SURGERY FS (CUSTOM PROCEDURE TRAY) ×2 IMPLANT
PAD CLEANER CAUTERY TIP 5X5 (MISCELLANEOUS) ×1
PENCIL SMOKE EVACUATOR (MISCELLANEOUS) ×2 IMPLANT
SLEEVE SCD COMPRESS KNEE MED (MISCELLANEOUS) ×2 IMPLANT
SPONGE GAUZE 2X2 8PLY STRL LF (GAUZE/BANDAGES/DRESSINGS) ×2 IMPLANT
STRIP CLOSURE SKIN 1/2X4 (GAUZE/BANDAGES/DRESSINGS) ×2 IMPLANT
SUT MON AB 4-0 PC3 18 (SUTURE) ×2 IMPLANT
SUT PROLENE 2 0 CT2 30 (SUTURE) ×2 IMPLANT
SUT VIC AB 3-0 SH 27 (SUTURE) ×2
SUT VIC AB 3-0 SH 27X BRD (SUTURE) ×1 IMPLANT
SYR 5ML LUER SLIP (SYRINGE) ×2 IMPLANT
SYR CONTROL 10ML LL (SYRINGE) ×2 IMPLANT
TOWEL GREEN STERILE FF (TOWEL DISPOSABLE) ×2 IMPLANT
TUBE CONNECTING 20X1/4 (TUBING) IMPLANT
YANKAUER SUCT BULB TIP NO VENT (SUCTIONS) IMPLANT

## 2020-06-15 NOTE — Transfer of Care (Signed)
Immediate Anesthesia Transfer of Care Note  Patient: Jo Mills  Procedure(s) Performed: INSERTION PORT-A-CATH WITH ULTRASOUND GUIDANCE (N/A Chest)  Patient Location: PACU  Anesthesia Type:General  Level of Consciousness: drowsy  Airway & Oxygen Therapy: Patient Spontanous Breathing and Patient connected to face mask oxygen  Post-op Assessment: Report given to RN and Post -op Vital signs reviewed and stable  Post vital signs: Reviewed and stable  Last Vitals:  Vitals Value Taken Time  BP 108/65 06/15/20 1247  Temp    Pulse 77 06/15/20 1250  Resp 15 06/15/20 1250  SpO2 100 % 06/15/20 1250  Vitals shown include unvalidated device data.  Last Pain:  Vitals:   06/15/20 1109  TempSrc: Oral  PainSc: 0-No pain         Complications: No complications documented.

## 2020-06-15 NOTE — Interval H&P Note (Signed)
History and Physical Interval Note:  06/15/2020 10:35 AM  Jo Mills  has presented today for surgery, with the diagnosis of INVASIVE DUCTAL CARCINOMA.  The various methods of treatment have been discussed with the patient and family. After consideration of risks, benefits and other options for treatment, the patient has consented to  Procedure(s) with comments: INSERTION PORT-A-CATH WITH ULTRASOUND GUIDANCE (N/A) - LMA, LEAVE PORT ACCESSED-CHEMO START 9/1 as a surgical intervention.  The patient's history has been reviewed, patient examined, no change in status, stable for surgery.  I have reviewed the patient's chart and labs.  Questions were answered to the patient's satisfaction.     [name]

## 2020-06-15 NOTE — Anesthesia Procedure Notes (Signed)
Procedure Name: LMA Insertion Date/Time: 06/15/2020 12:05 PM Performed by: Lavonia Dana, CRNA Pre-anesthesia Checklist: Patient identified, Emergency Drugs available, Suction available and Patient being monitored Patient Re-evaluated:Patient Re-evaluated prior to induction Oxygen Delivery Method: Circle system utilized Preoxygenation: Pre-oxygenation with 100% oxygen Induction Type: IV induction Ventilation: Mask ventilation without difficulty LMA: LMA inserted LMA Size: 4.0 Number of attempts: 1 Airway Equipment and Method: Bite block Placement Confirmation: positive ETCO2 Tube secured with: Tape Dental Injury: Teeth and Oropharynx as per pre-operative assessment

## 2020-06-15 NOTE — Discharge Instructions (Signed)
PORT-A-CATH: POST OP INSTRUCTIONS  Always review your discharge instruction sheet given to you by the facility where your surgery was performed.   1. A prescription for pain medication may be given to you upon discharge. Take your pain medication as prescribed, if needed. If narcotic pain medicine is not needed, then you make take acetaminophen (Tylenol) or ibuprofen (Advil) as needed.  2. Take your usually prescribed medications unless otherwise directed. 3. If you need a refill on your pain medication, please contact our office. All narcotic pain medicine now requires a paper prescription.  Phoned in and fax refills are no longer allowed by law.  Prescriptions will not be filled after 5 pm or on weekends.  4. You should follow a light diet for the remainder of the day after your procedure. 5. Most patients will experience some mild swelling and/or bruising in the area of the incision. It may take several days to resolve. 6. It is common to experience some constipation if taking pain medication after surgery. Increasing fluid intake and taking a stool softener (such as Colace) will usually help or prevent this problem from occurring. A mild laxative (Milk of Magnesia or Miralax) should be taken according to package directions if there are no bowel movements after 48 hours.  7. Unless discharge instructions indicate otherwise, you may remove your bandages 48 hours after surgery, and you may shower at that time. You may have steri-strips (small white skin tapes) in place directly over the incision.  These strips should be left on the skin for 7-10 days.  If your surgeon used Dermabond (skin glue) on the incision, you may shower in 24 hours.  The glue will flake off over the next 2-3 weeks.  8. If your port is left accessed at the end of surgery (needle left in port), the dressing cannot get wet and should only by changed by a healthcare professional. When the port is no longer accessed (when the  needle has been removed), follow step 7.   9. ACTIVITIES:  Limit activity involving your arms for the next 72 hours. Do no strenuous exercise or activity for 1 week. You may drive when you are no longer taking prescription pain medication, you can comfortably wear a seatbelt, and you can maneuver your car. 10.You may need to see your doctor in the office for a follow-up appointment.  Please       check with your doctor.  11.When you receive a new Port-a-Cath, you will get a product guide and        ID card.  Please keep them in case you need them.  WHEN TO CALL YOUR DOCTOR [phone]): 1. Fever over 101.0 2. Chills 3. Continued bleeding from incision 4. Increased redness and tenderness at the site 5. Shortness of breath, difficulty breathing   The clinic staff is available to answer your questions during regular business hours. Please don't hesitate to call and ask to speak to one of the nurses or medical assistants for clinical concerns. If you have a medical emergency, go to the nearest emergency room or call 911.  A surgeon from [facility] is always on call at the hospital.     For further information, please visit [email or web address]    Post Anesthesia Home Care Instructions  Activity: Get plenty of rest for the remainder of the day. A responsible individual must stay with you for 24 hours following the procedure.  For the next 24 hours, DO NOT: -Drive a  car -Operate machinery -Drink alcoholic beverages -Take any medication unless instructed by your physician -Make any legal decisions or sign important papers.  Meals: Start with liquid foods such as gelatin or soup. Progress to regular foods as tolerated. Avoid greasy, spicy, heavy foods. If nausea and/or vomiting occur, drink only clear liquids until the nausea and/or vomiting subsides. Call your physician if vomiting continues.  Special Instructions/Symptoms: Your throat may feel dry or sore  from the anesthesia or the breathing tube placed in your throat during surgery. If this causes discomfort, gargle with warm salt water. The discomfort should disappear within 24 hours.  If you had a scopolamine patch placed behind your ear for the management of post- operative nausea and/or vomiting:  1. The medication in the patch is effective for 72 hours, after which it should be removed.  Wrap patch in a tissue and discard in the trash. Wash hands thoroughly with soap and water. 2. You may remove the patch earlier than 72 hours if you experience unpleasant side effects which may include dry mouth, dizziness or visual disturbances. 3. Avoid touching the patch. Wash your hands with soap and water after contact with the patch.    No tylenol until after 5pm today if needed.     

## 2020-06-15 NOTE — Progress Notes (Signed)
Patient Care Team: Roderic Scarce, MD as PCP - General (Nurse Practitioner) Rockwell Germany, RN as Oncology Nurse Navigator Mauro Kaufmann, RN as Oncology Nurse Navigator  DIAGNOSIS:    ICD-10-CM   1. Malignant neoplasm of overlapping sites of left breast in female, estrogen receptor negative ([place])  C50.812    Z17.1     SUMMARY OF ONCOLOGIC HISTORY: Oncology History  Malignant neoplasm of overlapping sites of left breast in female, estrogen receptor negative ([place])  05/17/2020 Cancer Staging   Staging form: Breast, AJCC 8th Edition - Clinical stage from 05/17/2020: Stage IIB (cT2, cN0, cM0, G3, ER-, PR-, HER2-) - Signed by Gardenia Phlegm, NP on 05/26/2020   05/26/2020 Initial Diagnosis   Patient palpated a left breast mass for 5 months. Mammogram and US showed a 2.7cm mass at the 10:30 position, one left axillary lymph node with cortical thickening, calcifications in the lower inner left breast, and a 0.4cm group of calcifications in the right breast. Biopsy on 05/14/20 showed no malignancy in the right breast, and in the left breast, DCIS, high grade, ER/PR negative. 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    06/16/2020 -  Chemotherapy   The patient had dexamethasone (DECADRON) 4 MG tablet, 1 of 1 cycle, Start date: 06/08/2020, End date: -- DOXOrubicin (ADRIAMYCIN) chemo injection 112 mg, 60 mg/m2 = 112 mg, Intravenous,  Once, 1 of 4 cycles palonosetron (ALOXI) injection 0.25 mg, 0.25 mg, Intravenous,  Once, 1 of 8 cycles pegfilgrastim-bmez (ZIEXTENZO) injection 6 mg, 6 mg, Subcutaneous,  Once, 1 of 4 cycles CARBOplatin (PARAPLATIN) in sodium chloride 0.9 % 100 mL chemo infusion, , Intravenous,  Once, 0 of 4 cycles cyclophosphamide (CYTOXAN) 1,120 mg in sodium chloride 0.9 % 250 mL chemo infusion, 600 mg/m2 = 1,120 mg, Intravenous,  Once, 1 of  4 cycles PACLitaxel (TAXOL) 150 mg in sodium chloride 0.9 % 250 mL chemo infusion (</= 33m/m2), 80 mg/m2, Intravenous,  Once, 0 of 4 cycles fosaprepitant (EMEND) 150 mg in sodium chloride 0.9 % 145 mL IVPB, 150 mg, Intravenous,  Once, 1 of 8 cycles pembrolizumab (KEYTRUDA) 150 mg in sodium chloride 0.9 % 50 mL chemo infusion, 2 mg/kg = 150 mg, Intravenous, Once, 1 of 8 cycles Dose modification: 200 mg (original dose 2 mg/kg, Cycle 2, Reason: Provider Judgment, Comment: flat dose per manufacturer)  for chemotherapy treatment.      CHIEF COMPLIANT: Cycle 1 Adriamycin, Cytoxan, and Keytruda  INTERVAL HISTORY: LLatise Dilleyis a 69y.o. with above-mentioned history of left breast cancer currently on neoadjuvant chemotherapy with Adriamycin, Cytoxan, and Keytruda. Echo on 06/09/20 showed an ejection fraction of 60-65%. Her port was placed by Dr. TGeorgette Doveron 06/15/20. She presents to the clinic today for cycle 1.   ALLERGIES:  is allergic to grapefruit extract, pseudoephedrine hcl, nickel, pineapple, and amoxicillin.  MEDICATIONS:  Current Outpatient Medications  Medication Sig Dispense Refill  . amLODipine-benazepril (LOTREL) 5-20 MG capsule Take 1 capsule by mouth daily.    .Marland Kitchenb complex vitamins capsule Take 1 capsule by mouth daily.    . cholecalciferol (VITAMIN D3) 25 MCG (1000 UNIT) tablet Take 1,000 Units by mouth daily.    .Marland Kitchendexamethasone (DECADRON) 4 MG tablet Take 1 tablet day after chemo and 1 tablet 2 days after chemo with food 16 tablet 1  . fexofenadine (ALLEGRA) 180 MG tablet  Take 180 mg by mouth daily.    Marland Kitchen lidocaine-prilocaine (EMLA) cream Apply to affected area once 30 g 3  . LORazepam (ATIVAN) 0.5 MG tablet Take 1 tablet (0.5 mg total) by mouth at bedtime as needed for sleep. 30 tablet 0  . ondansetron (ZOFRAN) 8 MG tablet Take 1 tablet (8 mg total) by mouth 2 (two) times daily as needed. Start on the third day after chemotherapy. 30 tablet 1  . prochlorperazine (COMPAZINE) 10 MG  tablet Take 1 tablet (10 mg total) by mouth every 6 (six) hours as needed (Nausea or vomiting). 30 tablet 1  . zinc gluconate 50 MG tablet Take 50 mg by mouth daily.     No current facility-administered medications for this visit.   Facility-Administered Medications Ordered in Other Visits  Medication Dose Route Frequency Provider Last Rate Last Admin  . cyclophosphamide (CYTOXAN) 1,120 mg in sodium chloride 0.9 % 250 mL chemo infusion  600 mg/m2 (Treatment Plan Recorded) Intravenous Once Nicholas Lose, MD      . DOXOrubicin (ADRIAMYCIN) chemo injection 112 mg  60 mg/m2 (Treatment Plan Recorded) Intravenous Once Nicholas Lose, MD      . heparin lock flush 100 unit/mL  500 Units Intracatheter Once PRN Nicholas Lose, MD      . pembrolizumab (KEYTRUDA) 200 mg in sodium chloride 0.9 % 50 mL chemo infusion  200 mg Intravenous Once Nicholas Lose, MD 116 mL/hr at 06/16/20 1349 200 mg at 06/16/20 1349  . sodium chloride flush (NS) 0.9 % injection 10 mL  10 mL Intracatheter PRN Nicholas Lose, MD        PHYSICAL EXAMINATION: ECOG PERFORMANCE STATUS: 1 - Symptomatic but completely ambulatory  Vitals:   06/16/20 1108  BP: (!) 156/82  Pulse: 63  Resp: 17  Temp: 98.2 F (36.8 C)  SpO2: 98%   Filed Weights   06/16/20 1108  Weight: 170 lb 14.4 oz (77.5 kg)    LABORATORY DATA:  I have reviewed the data as listed CMP Latest Ref Rng & Units 06/16/2020  Glucose 70 - 99 mg/dL 94  BUN 8 - 23 mg/dL 16  Creatinine 0.44 - 1.00 mg/dL 0.78  Sodium 135 - 145 mmol/L 141  Potassium 3.5 - 5.1 mmol/L 4.1  Chloride 98 - 111 mmol/L 107  CO2 22 - 32 mmol/L 28  Calcium 8.9 - 10.3 mg/dL 10.7(H)  Total Protein 6.5 - 8.1 g/dL 6.6  Total Bilirubin 0.3 - 1.2 mg/dL 0.3  Alkaline Phos 38 - 126 U/L 93  AST 15 - 41 U/L 18  ALT 0 - 44 U/L 15    Lab Results  Component Value Date   WBC 14.9 (H) 06/16/2020   HGB 12.6 06/16/2020   HCT 37.5 06/16/2020   MCV 93.1 06/16/2020   PLT 267 06/16/2020   NEUTROABS 11.2  (H) 06/16/2020    ASSESSMENT & PLAN:  Malignant neoplasm of overlapping sites of left breast in female, estrogen receptor negative ([place]) 05/26/2020:Patient palpated a left breast mass for 5 months. Mammogram and US showed a 2.7cm mass at the 10:30 position, one left axillary lymph node with cortical thickening, calcifications in the lower inner left breast, and a 0.4cm group of calcifications in the right breast. Biopsy on 05/14/20 showed no malignancy in the right breast, and in the left breast, DCIS, high grade, ER/PR negative. 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   Treatment plan: 1.  Neoadjuvant chemotherapy with Adriamycin Cytoxan Keytruda every 3 weeks x4 followed by Taxol Doristine Church followed by Beryle Flock maintenance 2. Breast conserving surgery with targeted node dissection 2. Adjuvant radiation therapy --------------------------------------------------------------------------------------------------------------------------------------------- Current treatment: Cycle 1 day 1 Adriamycin, Cytoxan, Keytruda Labs have been reviewed Echocardiogram 06/09/2020: EF 60 to 65% I reviewed antiemetic regimen. TSH: 0.116: Suggestive of hyperthyroidism. We do not want to correct it at this time because the most likely adverse effect of immunotherapy is hypothyroidism. Dr. Delton Coombes can monitor her TSH and determine if and when she would need treatment for hyperthyroidism or hypothyroidism.  Patient wants to receive her treatment in the future at [facility] in [place]. I initially recommended that she should receive all of her treatment at [facility] but she wanted to get started on her treatment on 06/16/2020 and therefore we ended up giving her the first dose here.  No orders of the defined types were placed in this encounter.  The patient has  a good understanding of the overall plan. she agrees with it. she will call with any problems that may develop before the next visit here.  Total time spent: 30 mins including face to face time and time spent for planning, charting and coordination of care  Nicholas Lose, MD 06/16/2020  I, Cloyde Reams Dorshimer, am acting as scribe for Dr. Nicholas Lose.  I have reviewed the above documentation for accuracy and completeness, and I agree with the above.

## 2020-06-15 NOTE — Anesthesia Postprocedure Evaluation (Signed)
Anesthesia Post Note  Patient: Jo Mills  Procedure(s) Performed: INSERTION PORT-A-CATH WITH ULTRASOUND GUIDANCE (N/A Chest)     Patient location during evaluation: Phase II Anesthesia Type: General Level of consciousness: awake and alert, patient cooperative and oriented Pain management: pain level controlled Vital Signs Assessment: post-procedure vital signs reviewed and stable Respiratory status: spontaneous breathing, nonlabored ventilation and respiratory function stable Cardiovascular status: blood pressure returned to baseline and stable Postop Assessment: no apparent nausea or vomiting, adequate PO intake and able to ambulate Anesthetic complications: no   No complications documented.  Last Vitals:  Vitals:   06/15/20 1300 06/15/20 1315  BP: 123/66 124/63  Pulse: 81 74  Resp: 17 11  Temp:    SpO2: 97% 97%    Last Pain:  Vitals:   06/15/20 1300  TempSrc:   PainSc: 0-No pain                 Marino Rogerson,E. Daana Petrasek

## 2020-06-15 NOTE — Op Note (Signed)
Preop diagnosis: Left breast cancer Postop diagnosis: Same Procedure performed: Ultrasound guided right internal jugular vein port placement Surgeon:Neta Upadhyay K Shelly Shoultz Anesthesia: General via LMA Indications: This is a 69 year old female with recently diagnosed left breast cancer.  She is scheduled for neoadjuvant chemotherapy, so she presents now for port placement.  Description of procedure: The patient is brought to the operating room placed in the supine position on the operating table.  After an adequate level of general anesthesia was obtained, the patient right arm was tucked at her side.  Her right chest and neck were prepped with ChloraPrep and draped sterile fashion.  A timeout was taken to ensure the proper patient and proper procedure.  She was placed in Trendelenburg position.  We interrogated her neck with the ultrasound.  The jugular vein is easily identified.  Using ultrasound guidance we directly cannulated the internal jugular vein with good blood return.  The wire passed easily.  Fluoroscopy confirmed that the wire headed down the right side of the mediastinum.  The needle was removed.  We created a subcutaneous pocket below the right clavicle.  We first anesthetized with local anesthetic.  We created a subcutaneous tunnel from the subcutaneous pocket to the insertion site on the neck.  An 8 French [place] port was assembled and was tunneled from the subcutaneous pocket to the insertion site.  The catheter was cut to the appropriate length using fluoroscopic guidance.  Using fluoroscopic guidance, we passed the dilator and breakaway sheath over the wire.  The wire and dilator were removed.  The catheter was then advanced through the sheath which was removed.  Fluoroscopy confirmed that there were no kinks along the length of the catheter.  We are able to aspirate blood easily through the port and were able to flush easily.  The port was secured with two interrupted 2-0 Prolene sutures.  3-0  Vicryl was used to close the subcutaneous tissue and 4-0 Monocryl was used to close the skin at both sites.  Benzoin and Steri-Strips were applied.  We accessed the port and instilled concentrated heparin solution.  The needle and tubing are left in place for chemotherapy tomorrow.  An occlusive dressing was placed.  The patient was then extubated and brought to the recovery room in stable condition.  All sponge, instrument, and needle counts are correct.  Post-op chest x-ray is pending.  Imogene Burn. Georgette Dover, MD, [facility]  General/ Trauma Surgery   06/15/2020 12:45 PM

## 2020-06-15 NOTE — Anesthesia Preprocedure Evaluation (Addendum)
Anesthesia Evaluation  Patient identified by MRN, date of birth, ID band Patient awake    Reviewed: Allergy & Precautions, NPO status , Patient's Chart, lab work & pertinent test results  History of Anesthesia Complications Negative for: history of anesthetic complications  Airway Mallampati: I  TM Distance: >3 FB Neck ROM: Full    Dental  (+) Caps, Dental Advisory Given, Teeth Intact   Pulmonary neg pulmonary ROS,  06/11/2020 SARS coronavirus NEG   breath sounds clear to auscultation       Cardiovascular hypertension, Pt. on medications  Rhythm:Regular Rate:Normal  06/09/2020 ECHO: EF 60-65%, no significant valvular abnormalities   Neuro/Psych negative neurological ROS     GI/Hepatic Neg liver ROS, GERD  Controlled,  Endo/Other  negative endocrine ROS  Renal/GU negative Renal ROS     Musculoskeletal   Abdominal   Peds  Hematology negative hematology ROS (+)   Anesthesia Other Findings Breast cancer  Reproductive/Obstetrics                            Anesthesia Physical Anesthesia Plan  ASA: II  Anesthesia Plan: General   Post-op Pain Management:    Induction: Intravenous  PONV Risk Score and Plan: 3 and Ondansetron, Dexamethasone, Treatment may vary due to age or medical condition and Scopolamine patch - Pre-op  Airway Management Planned: LMA  Additional Equipment: None  Intra-op Plan:   Post-operative Plan:   Informed Consent: I have reviewed the patients History and Physical, chart, labs and discussed the procedure including the risks, benefits and alternatives for the proposed anesthesia with the patient or authorized representative who has indicated his/her understanding and acceptance.     Dental advisory given  Plan Discussed with: CRNA and Surgeon  Anesthesia Plan Comments:        Anesthesia Quick Evaluation

## 2020-06-15 NOTE — Progress Notes (Signed)
Pharmacist Chemotherapy Monitoring - Initial Assessment    Anticipated start date: 06/16/20  Regimen:  . Are orders appropriate based on the patient's diagnosis, regimen, and cycle? Yes . Does the plan date match the patient's scheduled date? Yes . Is the sequencing of drugs appropriate? Yes . Are the premedications appropriate for the patient's regimen? Yes . Prior Authorization for treatment is: Approved o If applicable, is the correct biosimilar selected based on the patient's insurance? not applicable  Organ Function and Labs: Marland Kitchen Are dose adjustments needed based on the patient's renal function, hepatic function, or hematologic function? Yes . Are appropriate labs ordered prior to the start of patient's treatment? Yes . Other organ system assessment, if indicated: N/A . The following baseline labs, if indicated, have been ordered: N/A  Dose Assessment: . Are the drug doses appropriate? Yes . Are the following correct: o Drug concentrations Yes o IV fluid compatible with drug Yes o Administration routes Yes o Timing of therapy Yes . If applicable, does the patient have documented access for treatment and/or plans for port-a-cath placement? yes . If applicable, have lifetime cumulative doses been properly documented and assessed? yes Lifetime Dose Tracking  No doses have been documented on this patient for the following tracked chemicals: Doxorubicin, Epirubicin, Idarubicin, Daunorubicin, Mitoxantrone, Bleomycin, Oxaliplatin, Carboplatin, Liposomal Doxorubicin  o   Toxicity Monitoring/Prevention: . The patient has the following take home antiemetics prescribed: Ondansetron, Prochlorperazine, Dexamethasone and Lorazepam . The patient has the following take home medications prescribed: N/A . Medication allergies and previous infusion related reactions, if applicable, have been reviewed and addressed. Yes . The patient's current medication list has been assessed for drug-drug  interactions with their chemotherapy regimen. no significant drug-drug interactions were identified on review.  Order Review: . Are the treatment plan orders signed? Yes . Is the patient scheduled to see a provider prior to their treatment? Yes  I verify that I have reviewed each item in the above checklist and answered each question accordingly.  Romualdo Bolk [facility] 06/15/2020 12:31 PM

## 2020-06-16 ENCOUNTER — Inpatient Hospital Stay: Payer: BC Managed Care – PPO

## 2020-06-16 ENCOUNTER — Inpatient Hospital Stay: Payer: BC Managed Care – PPO | Attending: Hematology and Oncology

## 2020-06-16 ENCOUNTER — Encounter: Payer: Self-pay | Admitting: *Deleted

## 2020-06-16 ENCOUNTER — Other Ambulatory Visit: Payer: Self-pay

## 2020-06-16 ENCOUNTER — Inpatient Hospital Stay (HOSPITAL_BASED_OUTPATIENT_CLINIC_OR_DEPARTMENT_OTHER): Payer: BC Managed Care – PPO | Admitting: Hematology and Oncology

## 2020-06-16 ENCOUNTER — Encounter (HOSPITAL_BASED_OUTPATIENT_CLINIC_OR_DEPARTMENT_OTHER): Payer: Self-pay | Admitting: Surgery

## 2020-06-16 ENCOUNTER — Other Ambulatory Visit: Payer: Self-pay | Admitting: *Deleted

## 2020-06-16 DIAGNOSIS — Z5111 Encounter for antineoplastic chemotherapy: Secondary | ICD-10-CM | POA: Insufficient documentation

## 2020-06-16 DIAGNOSIS — Z171 Estrogen receptor negative status [ER-]: Secondary | ICD-10-CM | POA: Diagnosis not present

## 2020-06-16 DIAGNOSIS — C50812 Malignant neoplasm of overlapping sites of left female breast: Secondary | ICD-10-CM | POA: Insufficient documentation

## 2020-06-16 DIAGNOSIS — Z7952 Long term (current) use of systemic steroids: Secondary | ICD-10-CM | POA: Diagnosis not present

## 2020-06-16 DIAGNOSIS — Z5189 Encounter for other specified aftercare: Secondary | ICD-10-CM | POA: Diagnosis not present

## 2020-06-16 DIAGNOSIS — Z5112 Encounter for antineoplastic immunotherapy: Secondary | ICD-10-CM | POA: Diagnosis not present

## 2020-06-16 DIAGNOSIS — Z79899 Other long term (current) drug therapy: Secondary | ICD-10-CM | POA: Insufficient documentation

## 2020-06-16 DIAGNOSIS — Z880 Allergy status to penicillin: Secondary | ICD-10-CM | POA: Diagnosis not present

## 2020-06-16 DIAGNOSIS — N6314 Unspecified lump in the right breast, lower inner quadrant: Secondary | ICD-10-CM | POA: Insufficient documentation

## 2020-06-16 LAB — CBC WITH DIFFERENTIAL (CANCER CENTER ONLY)
Abs Immature Granulocytes: 0.06 10*3/uL (ref 0.00–0.07)
Basophils Absolute: 0 10*3/uL (ref 0.0–0.1)
Basophils Relative: 0 %
Eosinophils Absolute: 0 10*3/uL (ref 0.0–0.5)
Eosinophils Relative: 0 %
HCT: 37.5 % (ref 36.0–46.0)
Hemoglobin: 12.6 g/dL (ref 12.0–15.0)
Immature Granulocytes: 0 %
Lymphocytes Relative: 19 %
Lymphs Abs: 2.8 10*3/uL (ref 0.7–4.0)
MCH: 31.3 pg (ref 26.0–34.0)
MCHC: 33.6 g/dL (ref 30.0–36.0)
MCV: 93.1 fL (ref 80.0–100.0)
Monocytes Absolute: 0.8 10*3/uL (ref 0.1–1.0)
Monocytes Relative: 5 %
Neutro Abs: 11.2 10*3/uL — ABNORMAL HIGH (ref 1.7–7.7)
Neutrophils Relative %: 76 %
Platelet Count: 267 10*3/uL (ref 150–400)
RBC: 4.03 MIL/uL (ref 3.87–5.11)
RDW: 13.1 % (ref 11.5–15.5)
WBC Count: 14.9 10*3/uL — ABNORMAL HIGH (ref 4.0–10.5)
nRBC: 0 % (ref 0.0–0.2)

## 2020-06-16 LAB — CMP (CANCER CENTER ONLY)
ALT: 15 U/L (ref 0–44)
AST: 18 U/L (ref 15–41)
Albumin: 3.7 g/dL (ref 3.5–5.0)
Alkaline Phosphatase: 93 U/L (ref 38–126)
Anion gap: 6 (ref 5–15)
BUN: 16 mg/dL (ref 8–23)
CO2: 28 mmol/L (ref 22–32)
Calcium: 10.7 mg/dL — ABNORMAL HIGH (ref 8.9–10.3)
Chloride: 107 mmol/L (ref 98–111)
Creatinine: 0.78 mg/dL (ref 0.44–1.00)
GFR, Est AFR Am: >60 mL/min (ref >60)
GFR, Estimated: >60 mL/min (ref >60)
Glucose, Bld: 94 mg/dL (ref 70–99)
Potassium: 4.1 mmol/L (ref 3.5–5.1)
Sodium: 141 mmol/L (ref 135–145)
Total Bilirubin: 0.3 mg/dL (ref 0.3–1.2)
Total Protein: 6.6 g/dL (ref 6.5–8.1)

## 2020-06-16 LAB — TSH: TSH: 0.116 u[IU]/mL — ABNORMAL LOW (ref 0.308–3.960)

## 2020-06-16 MED ORDER — PALONOSETRON HCL INJECTION 0.25 MG/5ML
INTRAVENOUS | Status: AC
  Filled 2020-06-16: qty 5

## 2020-06-16 MED ORDER — HEPARIN SOD (PORK) LOCK FLUSH 100 UNIT/ML IV SOLN
500.0000 [IU] | Freq: Once | INTRAVENOUS | Status: AC | PRN
  Administered 2020-06-16: 500 [IU]
  Filled 2020-06-16: qty 5

## 2020-06-16 MED ORDER — SODIUM CHLORIDE 0.9 % IV SOLN
150.0000 mg | Freq: Once | INTRAVENOUS | Status: AC
  Administered 2020-06-16: 150 mg via INTRAVENOUS
  Filled 2020-06-16: qty 150

## 2020-06-16 MED ORDER — SODIUM CHLORIDE 0.9 % IV SOLN
10.0000 mg | Freq: Once | INTRAVENOUS | Status: AC
  Administered 2020-06-16: 10 mg via INTRAVENOUS
  Filled 2020-06-16: qty 1

## 2020-06-16 MED ORDER — SODIUM CHLORIDE 0.9 % IV SOLN
600.0000 mg/m2 | Freq: Once | INTRAVENOUS | Status: AC
  Administered 2020-06-16: 1120 mg via INTRAVENOUS
  Filled 2020-06-16: qty 56

## 2020-06-16 MED ORDER — SODIUM CHLORIDE 0.9 % IV SOLN
Freq: Once | INTRAVENOUS | Status: AC
  Filled 2020-06-16: qty 250

## 2020-06-16 MED ORDER — SODIUM CHLORIDE 0.9 % IV SOLN
200.0000 mg | Freq: Once | INTRAVENOUS | Status: AC
  Administered 2020-06-16: 200 mg via INTRAVENOUS
  Filled 2020-06-16: qty 8

## 2020-06-16 MED ORDER — SODIUM CHLORIDE 0.9% FLUSH
10.0000 mL | INTRAVENOUS | Status: DC | PRN
  Administered 2020-06-16: 10 mL
  Filled 2020-06-16: qty 10

## 2020-06-16 MED ORDER — PALONOSETRON HCL INJECTION 0.25 MG/5ML
0.2500 mg | Freq: Once | INTRAVENOUS | Status: AC
  Administered 2020-06-16: 0.25 mg via INTRAVENOUS

## 2020-06-16 MED ORDER — DOXORUBICIN HCL CHEMO IV INJECTION 2 MG/ML
60.0000 mg/m2 | Freq: Once | INTRAVENOUS | Status: AC
  Administered 2020-06-16: 112 mg via INTRAVENOUS
  Filled 2020-06-16: qty 56

## 2020-06-16 NOTE — Patient Instructions (Addendum)
[facility] Discharge Instructions for Patients Receiving Chemotherapy  Today you received the following chemotherapy agents: Keytruda/Adriamycin/Cytoxan.  To help prevent nausea and vomiting after your treatment, we encourage you to take your nausea medication as directed.   If you develop nausea and vomiting that is not controlled by your nausea medication, call the clinic.   BELOW ARE SYMPTOMS THAT SHOULD BE REPORTED IMMEDIATELY:  *FEVER GREATER THAN 100.5 F  *CHILLS WITH OR WITHOUT FEVER  NAUSEA AND VOMITING THAT IS NOT CONTROLLED WITH YOUR NAUSEA MEDICATION  *UNUSUAL SHORTNESS OF BREATH  *UNUSUAL BRUISING OR BLEEDING  TENDERNESS IN MOUTH AND THROAT WITH OR WITHOUT PRESENCE OF ULCERS  *URINARY PROBLEMS  *BOWEL PROBLEMS  UNUSUAL RASH Items with * indicate a potential emergency and should be followed up as soon as possible.  Feel free to call the clinic should you have any questions or concerns. The clinic phone number is [phone].  Please show the [facility] at check-in to the Emergency Department and triage nurse.  Pembrolizumab injection What is this medicine? PEMBROLIZUMAB (pem broe liz ue mab) is a monoclonal antibody. It is used to treat certain types of cancer. This medicine may be used for other purposes; ask your health care provider or pharmacist if you have questions. COMMON BRAND NAME(S): Keytruda What should I tell my health care provider before I take this medicine? They need to know if you have any of these conditions:  diabetes  immune system problems  inflammatory bowel disease  liver disease  lung or breathing disease  lupus  received or scheduled to receive an organ transplant or a stem-cell transplant that uses donor stem cells  an unusual or allergic reaction to pembrolizumab, other medicines, foods, dyes, or preservatives  pregnant or trying to get pregnant  breast-feeding How should I use this  medicine? This medicine is for infusion into a vein. It is given by a health care professional in a hospital or clinic setting. A special MedGuide will be given to you before each treatment. Be sure to read this information carefully each time. Talk to your pediatrician regarding the use of this medicine in children. While this drug may be prescribed for children as young as 6 months for selected conditions, precautions do apply. Overdosage: If you think you have taken too much of this medicine contact a poison control center or emergency room at once. NOTE: This medicine is only for you. Do not share this medicine with others. What if I miss a dose? It is important not to miss your dose. Call your doctor or health care professional if you are unable to keep an appointment. What may interact with this medicine? Interactions have not been studied. Give your health care provider a list of all the medicines, herbs, non-prescription drugs, or dietary supplements you use. Also tell them if you smoke, drink alcohol, or use illegal drugs. Some items may interact with your medicine. This list may not describe all possible interactions. Give your health care provider a list of all the medicines, herbs, non-prescription drugs, or dietary supplements you use. Also tell them if you smoke, drink alcohol, or use illegal drugs. Some items may interact with your medicine. What should I watch for while using this medicine? Your condition will be monitored carefully while you are receiving this medicine. You may need blood work done while you are taking this medicine. Do not become pregnant while taking this medicine or for 4 months after stopping it. Women should  inform their doctor if they wish to become pregnant or think they might be pregnant. There is a potential for serious side effects to an unborn child. Talk to your health care professional or pharmacist for more information. Do not breast-feed an infant while  taking this medicine or for 4 months after the last dose. What side effects may I notice from receiving this medicine? Side effects that you should report to your doctor or health care professional as soon as possible:  allergic reactions like skin rash, itching or hives, swelling of the face, lips, or tongue  bloody or black, tarry  breathing problems  changes in vision  chest pain  chills  confusion  constipation  cough  diarrhea  dizziness or feeling faint or lightheaded  fast or irregular heartbeat  fever  flushing  joint pain  low blood counts - this medicine may decrease the number of white blood cells, red blood cells and platelets. You may be at increased risk for infections and bleeding.  muscle pain  muscle weakness  pain, tingling, numbness in the hands or feet  persistent headache  redness, blistering, peeling or loosening of the skin, including inside the mouth  signs and symptoms of high blood sugar such as dizziness; dry mouth; dry skin; fruity breath; nausea; stomach pain; increased hunger or thirst; increased urination  signs and symptoms of kidney injury like trouble passing urine or change in the amount of urine  signs and symptoms of liver injury like dark urine, light-colored stools, loss of appetite, nausea, right upper belly pain, yellowing of the eyes or skin  sweating  swollen lymph nodes  weight loss Side effects that usually do not require medical attention (report to your doctor or health care professional if they continue or are bothersome):  decreased appetite  hair loss  muscle pain  tiredness This list may not describe all possible side effects. Call your doctor for medical advice about side effects. You may report side effects to FDA at 1-800-FDA-1088. Where should I keep my medicine? This drug is given in a hospital or clinic and will not be stored at home. NOTE: This sheet is a summary. It may not cover all  possible information. If you have questions about this medicine, talk to your doctor, pharmacist, or health care provider.  2020 Elsevier/Gold Standard (2019-08-08 18:07:58)  Doxorubicin injection What is this medicine? DOXORUBICIN (dox oh ROO bi sin) is a chemotherapy drug. It is used to treat many kinds of cancer like leukemia, lymphoma, neuroblastoma, sarcoma, and Wilms' tumor. It is also used to treat bladder cancer, breast cancer, lung cancer, ovarian cancer, stomach cancer, and thyroid cancer. This medicine may be used for other purposes; ask your health care provider or pharmacist if you have questions. COMMON BRAND NAME(S): Adriamycin, Adriamycin PFS, Adriamycin RDF, Rubex What should I tell my health care provider before I take this medicine? They need to know if you have any of these conditions:  heart disease  history of low blood counts caused by a medicine  liver disease  recent or ongoing radiation therapy  an unusual or allergic reaction to doxorubicin, other chemotherapy agents, other medicines, foods, dyes, or preservatives  pregnant or trying to get pregnant  breast-feeding How should I use this medicine? This drug is given as an infusion into a vein. It is administered in a hospital or clinic by a specially trained health care professional. If you have pain, swelling, burning or any unusual feeling around the site of  your injection, tell your health care professional right away. Talk to your pediatrician regarding the use of this medicine in children. Special care may be needed. Overdosage: If you think you have taken too much of this medicine contact a poison control center or emergency room at once. NOTE: This medicine is only for you. Do not share this medicine with others. What if I miss a dose? It is important not to miss your dose. Call your doctor or health care professional if you are unable to keep an appointment. What may interact with this medicine? This  medicine may interact with the following medications:  6-mercaptopurine  paclitaxel  phenytoin  St. John's Wort  trastuzumab  verapamil This list may not describe all possible interactions. Give your health care provider a list of all the medicines, herbs, non-prescription drugs, or dietary supplements you use. Also tell them if you smoke, drink alcohol, or use illegal drugs. Some items may interact with your medicine. What should I watch for while using this medicine? This drug may make you feel generally unwell. This is not uncommon, as chemotherapy can affect healthy cells as well as cancer cells. Report any side effects. Continue your course of treatment even though you feel ill unless your doctor tells you to stop. There is a maximum amount of this medicine you should receive throughout your life. The amount depends on the medical condition being treated and your overall health. Your doctor will watch how much of this medicine you receive in your lifetime. Tell your doctor if you have taken this medicine before. You may need blood work done while you are taking this medicine. Your urine may turn red for a few days after your dose. This is not blood. If your urine is dark or brown, call your doctor. In some cases, you may be given additional medicines to help with side effects. Follow all directions for their use. Call your doctor or health care professional for advice if you get a fever, chills or sore throat, or other symptoms of a cold or flu. Do not treat yourself. This drug decreases your body's ability to fight infections. Try to avoid being around people who are sick. This medicine may increase your risk to bruise or bleed. Call your doctor or health care professional if you notice any unusual bleeding. Talk to your doctor about your risk of cancer. You may be more at risk for certain types of cancers if you take this medicine. Do not become pregnant while taking this medicine or  for 6 months after stopping it. Women should inform their doctor if they wish to become pregnant or think they might be pregnant. Men should not father a child while taking this medicine and for 6 months after stopping it. There is a potential for serious side effects to an unborn child. Talk to your health care professional or pharmacist for more information. Do not breast-feed an infant while taking this medicine. This medicine has caused ovarian failure in some women and reduced sperm counts in some men This medicine may interfere with the ability to have a child. Talk with your doctor or health care professional if you are concerned about your fertility. This medicine may cause a decrease in Co-Enzyme Q-10. You should make sure that you get enough Co-Enzyme Q-10 while you are taking this medicine. Discuss the foods you eat and the vitamins you take with your health care professional. What side effects may I notice from receiving this medicine? Side effects  that you should report to your doctor or health care professional as soon as possible:  allergic reactions like skin rash, itching or hives, swelling of the face, lips, or tongue  breathing problems  chest pain  fast or irregular heartbeat  low blood counts - this medicine may decrease the number of white blood cells, red blood cells and platelets. You may be at increased risk for infections and bleeding.  pain, redness, or irritation at site where injected  signs of infection - fever or chills, cough, sore throat, pain or difficulty passing urine  signs of decreased platelets or bleeding - bruising, pinpoint red spots on the skin, black, tarry stools, blood in the urine  swelling of the ankles, feet, hands  tiredness  weakness Side effects that usually do not require medical attention (report to your doctor or health care professional if they continue or are bothersome):  diarrhea  hair loss  mouth sores  nail discoloration  or damage  nausea  red colored urine  vomiting This list may not describe all possible side effects. Call your doctor for medical advice about side effects. You may report side effects to FDA at 1-800-FDA-1088. Where should I keep my medicine? This drug is given in a hospital or clinic and will not be stored at home. NOTE: This sheet is a summary. It may not cover all possible information. If you have questions about this medicine, talk to your doctor, pharmacist, or health care provider.  2020 Elsevier/Gold Standard (2017-05-16 11:01:26)  Cyclophosphamide Injection What is this medicine? CYCLOPHOSPHAMIDE (sye kloe FOSS fa mide) is a chemotherapy drug. It slows the growth of cancer cells. This medicine is used to treat many types of cancer like lymphoma, myeloma, leukemia, breast cancer, and ovarian cancer, to name a few. This medicine may be used for other purposes; ask your health care provider or pharmacist if you have questions. COMMON BRAND NAME(S): Cytoxan, Neosar What should I tell my health care provider before I take this medicine? They need to know if you have any of these conditions:  heart disease  history of irregular heartbeat  infection  kidney disease  liver disease  low blood counts, like white cells, platelets, or red blood cells  on hemodialysis  recent or ongoing radiation therapy  scarring or thickening of the lungs  trouble passing urine  an unusual or allergic reaction to cyclophosphamide, other medicines, foods, dyes, or preservatives  pregnant or trying to get pregnant  breast-feeding How should I use this medicine? This drug is usually given as an injection into a vein or muscle or by infusion into a vein. It is administered in a hospital or clinic by a specially trained health care professional. Talk to your pediatrician regarding the use of this medicine in children. Special care may be needed. Overdosage: If you think you have taken too  much of this medicine contact a poison control center or emergency room at once. NOTE: This medicine is only for you. Do not share this medicine with others. What if I miss a dose? It is important not to miss your dose. Call your doctor or health care professional if you are unable to keep an appointment. What may interact with this medicine?  amphotericin B  azathioprine  certain antivirals for HIV or hepatitis  certain medicines for blood pressure, heart disease, irregular heart beat  certain medicines that treat or prevent blood clots like warfarin  certain other medicines for cancer  cyclosporine  etanercept  indomethacin  medicines that relax muscles for surgery  medicines to increase blood counts  metronidazole This list may not describe all possible interactions. Give your health care provider a list of all the medicines, herbs, non-prescription drugs, or dietary supplements you use. Also tell them if you smoke, drink alcohol, or use illegal drugs. Some items may interact with your medicine. What should I watch for while using this medicine? Your condition will be monitored carefully while you are receiving this medicine. You may need blood work done while you are taking this medicine. Drink water or other fluids as directed. Urinate often, even at night. Some products may contain alcohol. Ask your health care professional if this medicine contains alcohol. Be sure to tell all health care professionals you are taking this medicine. Certain medicines, like metronidazole and disulfiram, can cause an unpleasant reaction when taken with alcohol. The reaction includes flushing, headache, nausea, vomiting, sweating, and increased thirst. The reaction can last from 30 minutes to several hours. Do not become pregnant while taking this medicine or for 1 year after stopping it. Women should inform their health care professional if they wish to become pregnant or think they might be  pregnant. Men should not father a child while taking this medicine and for 4 months after stopping it. There is potential for serious side effects to an unborn child. Talk to your health care professional for more information. Do not breast-feed an infant while taking this medicine or for 1 week after stopping it. This medicine has caused ovarian failure in some women. This medicine may make it more difficult to get pregnant. Talk to your health care professional if you are concerned about your fertility. This medicine has caused decreased sperm counts in some men. This may make it more difficult to father a child. Talk to your health care professional if you are concerned about your fertility. Call your health care professional for advice if you get a fever, chills, or sore throat, or other symptoms of a cold or flu. Do not treat yourself. This medicine decreases your body's ability to fight infections. Try to avoid being around people who are sick. Avoid taking medicines that contain aspirin, acetaminophen, ibuprofen, naproxen, or ketoprofen unless instructed by your health care professional. These medicines may hide a fever. Talk to your health care professional about your risk of cancer. You may be more at risk for certain types of cancer if you take this medicine. If you are going to need surgery or other procedure, tell your health care professional that you are using this medicine. Be careful brushing or flossing your teeth or using a toothpick because you may get an infection or bleed more easily. If you have any dental work done, tell your dentist you are receiving this medicine. What side effects may I notice from receiving this medicine? Side effects that you should report to your doctor or health care professional as soon as possible:  allergic reactions like skin rash, itching or hives, swelling of the face, lips, or tongue  breathing problems  nausea, vomiting  signs and symptoms of  bleeding such as bloody or black, tarry stools; red or dark brown urine; spitting up blood or brown material that looks like coffee grounds; red spots on the skin; unusual bruising or bleeding from the eyes, gums, or nose  signs and symptoms of heart failure like fast, irregular heartbeat, sudden weight gain; swelling of the ankles, feet, hands  signs and symptoms of infection like fever; chills; cough;  sore throat; pain or trouble passing urine  signs and symptoms of kidney injury like trouble passing urine or change in the amount of urine  signs and symptoms of liver injury like dark yellow or brown urine; general ill feeling or flu-like symptoms; light-colored stools; loss of appetite; nausea; right upper belly pain; unusually weak or tired; yellowing of the eyes or skin Side effects that usually do not require medical attention (report to your doctor or health care professional if they continue or are bothersome):  confusion  decreased hearing  diarrhea  facial flushing  hair loss  headache  loss of appetite  missed menstrual periods  signs and symptoms of low red blood cells or anemia such as unusually weak or tired; feeling faint or lightheaded; falls  skin discoloration This list may not describe all possible side effects. Call your doctor for medical advice about side effects. You may report side effects to FDA at 1-800-FDA-1088. Where should I keep my medicine? This drug is given in a hospital or clinic and will not be stored at home. NOTE: This sheet is a summary. It may not cover all possible information. If you have questions about this medicine, talk to your doctor, pharmacist, or health care provider.  2020 Elsevier/Gold Standard (2019-07-07 09:53:29)

## 2020-06-16 NOTE — Progress Notes (Signed)
Change Keytruda dose from 2 mg/kg to flat dose of 200 mg.  Orders updated.  T.O. Dr Jannifer Hick, PharmD

## 2020-06-16 NOTE — Assessment & Plan Note (Signed)
05/26/2020:Patient palpated a left breast mass for 5 months. Mammogram and US showed a 2.7cm mass at the 10:30 position, one left axillary lymph node with cortical thickening, calcifications in the lower inner left breast, and a 0.4cm group of calcifications in the right breast. Biopsy on 05/14/20 showed no malignancy in the right breast, and in the left breast, DCIS, high grade, ER/PR negative. 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   Treatment plan: 1.  Neoadjuvant chemotherapy with Adriamycin Cytoxan Keytruda every 3 weeks x4 followed by Taxol Doristine Church followed by Beryle Flock maintenance 2. Breast conserving surgery with targeted node dissection 2. Adjuvant radiation therapy --------------------------------------------------------------------------------------------------------------------------------------------- Current treatment: Cycle 1 day 1 Adriamycin, Cytoxan, Keytruda Labs have been reviewed Echocardiogram 06/09/2020: EF 60 to 65%  Patient wants to receive her treatment in the future at [facility] in [place].

## 2020-06-17 ENCOUNTER — Inpatient Hospital Stay: Payer: BC Managed Care – PPO

## 2020-06-17 ENCOUNTER — Other Ambulatory Visit: Payer: BC Managed Care – PPO

## 2020-06-17 ENCOUNTER — Ambulatory Visit: Payer: BC Managed Care – PPO | Admitting: Hematology and Oncology

## 2020-06-18 ENCOUNTER — Telehealth: Payer: Self-pay | Admitting: Hematology and Oncology

## 2020-06-18 ENCOUNTER — Ambulatory Visit: Payer: BC Managed Care – PPO

## 2020-06-18 ENCOUNTER — Encounter (HOSPITAL_COMMUNITY): Payer: Self-pay

## 2020-06-18 NOTE — Telephone Encounter (Signed)
R/s 9/3 appt per patient request. Called and confirmed with patient.

## 2020-06-18 NOTE — Progress Notes (Signed)
I have attempted to place an introductory phone call to this patient. Unable to reach the patient at this time. I have left a voicemail for the patient and provided my contact information. I will plan to meet with the patient during initial visit with Dr. Delton Coombes.

## 2020-06-18 NOTE — Telephone Encounter (Signed)
Per 9/1 los, no changes made to pt schedule. 9/4 appt already scheduled

## 2020-06-19 ENCOUNTER — Inpatient Hospital Stay: Payer: BC Managed Care – PPO

## 2020-06-19 ENCOUNTER — Other Ambulatory Visit: Payer: Self-pay

## 2020-06-19 ENCOUNTER — Ambulatory Visit: Payer: BC Managed Care – PPO

## 2020-06-19 VITALS — BP 131/84 | HR 57 | Temp 98.2°F | Resp 18

## 2020-06-19 DIAGNOSIS — Z5112 Encounter for antineoplastic immunotherapy: Secondary | ICD-10-CM | POA: Diagnosis not present

## 2020-06-19 MED ORDER — PEGFILGRASTIM-BMEZ 6 MG/0.6ML ~~LOC~~ SOSY
6.0000 mg | PREFILLED_SYRINGE | Freq: Once | SUBCUTANEOUS | Status: AC
  Administered 2020-06-19: 6 mg via SUBCUTANEOUS

## 2020-06-19 NOTE — Patient Instructions (Signed)

## 2020-06-23 ENCOUNTER — Encounter (HOSPITAL_COMMUNITY): Payer: Self-pay | Admitting: Hematology

## 2020-06-23 ENCOUNTER — Other Ambulatory Visit: Payer: Self-pay

## 2020-06-23 ENCOUNTER — Inpatient Hospital Stay (HOSPITAL_COMMUNITY): Payer: BC Managed Care – PPO

## 2020-06-23 ENCOUNTER — Inpatient Hospital Stay (HOSPITAL_COMMUNITY): Payer: BC Managed Care – PPO | Attending: Hematology | Admitting: Hematology

## 2020-06-23 VITALS — BP 131/70 | HR 66 | Temp 96.9°F | Resp 18 | Ht 65.5 in | Wt 161.6 lb

## 2020-06-23 DIAGNOSIS — N6314 Unspecified lump in the right breast, lower inner quadrant: Secondary | ICD-10-CM

## 2020-06-23 DIAGNOSIS — Z5112 Encounter for antineoplastic immunotherapy: Secondary | ICD-10-CM | POA: Diagnosis not present

## 2020-06-23 DIAGNOSIS — D72819 Decreased white blood cell count, unspecified: Secondary | ICD-10-CM

## 2020-06-23 DIAGNOSIS — Z79899 Other long term (current) drug therapy: Secondary | ICD-10-CM | POA: Diagnosis not present

## 2020-06-23 DIAGNOSIS — Z803 Family history of malignant neoplasm of breast: Secondary | ICD-10-CM | POA: Diagnosis not present

## 2020-06-23 DIAGNOSIS — Z85828 Personal history of other malignant neoplasm of skin: Secondary | ICD-10-CM

## 2020-06-23 DIAGNOSIS — Z171 Estrogen receptor negative status [ER-]: Secondary | ICD-10-CM

## 2020-06-23 DIAGNOSIS — R5383 Other fatigue: Secondary | ICD-10-CM

## 2020-06-23 DIAGNOSIS — Z5189 Encounter for other specified aftercare: Secondary | ICD-10-CM | POA: Diagnosis not present

## 2020-06-23 DIAGNOSIS — Z800 Family history of malignant neoplasm of digestive organs: Secondary | ICD-10-CM

## 2020-06-23 DIAGNOSIS — Z880 Allergy status to penicillin: Secondary | ICD-10-CM

## 2020-06-23 DIAGNOSIS — R197 Diarrhea, unspecified: Secondary | ICD-10-CM | POA: Diagnosis not present

## 2020-06-23 DIAGNOSIS — Z809 Family history of malignant neoplasm, unspecified: Secondary | ICD-10-CM

## 2020-06-23 DIAGNOSIS — C50212 Malignant neoplasm of upper-inner quadrant of left female breast: Secondary | ICD-10-CM

## 2020-06-23 DIAGNOSIS — C50812 Malignant neoplasm of overlapping sites of left female breast: Secondary | ICD-10-CM

## 2020-06-23 DIAGNOSIS — Z5111 Encounter for antineoplastic chemotherapy: Secondary | ICD-10-CM | POA: Diagnosis present

## 2020-06-23 DIAGNOSIS — Z8249 Family history of ischemic heart disease and other diseases of the circulatory system: Secondary | ICD-10-CM

## 2020-06-23 DIAGNOSIS — R319 Hematuria, unspecified: Secondary | ICD-10-CM

## 2020-06-23 DIAGNOSIS — N6324 Unspecified lump in the left breast, lower inner quadrant: Secondary | ICD-10-CM | POA: Diagnosis not present

## 2020-06-23 LAB — CBC WITH DIFFERENTIAL/PLATELET
Abs Immature Granulocytes: 0 10*3/uL (ref 0.00–0.07)
Band Neutrophils: 0 %
Basophils Absolute: 0 10*3/uL (ref 0.0–0.1)
Basophils Relative: 2 %
Blasts: 0 %
Eosinophils Absolute: 0.1 10*3/uL (ref 0.0–0.5)
Eosinophils Relative: 7 %
HCT: 41.4 % (ref 36.0–46.0)
Hemoglobin: 13.5 g/dL (ref 12.0–15.0)
Lymphocytes Relative: 55 %
Lymphs Abs: 0.8 10*3/uL (ref 0.7–4.0)
MCH: 30.9 pg (ref 26.0–34.0)
MCHC: 32.6 g/dL (ref 30.0–36.0)
MCV: 94.7 fL (ref 80.0–100.0)
Metamyelocytes Relative: 0 %
Monocytes Absolute: 0.1 10*3/uL (ref 0.1–1.0)
Monocytes Relative: 5 %
Myelocytes: 0 %
Neutro Abs: 0.5 10*3/uL — ABNORMAL LOW (ref 1.7–7.7)
Neutrophils Relative %: 30 %
Other: 1 %
Platelets: 145 10*3/uL — ABNORMAL LOW (ref 150–400)
Promyelocytes Relative: 0 %
RBC: 4.37 MIL/uL (ref 3.87–5.11)
RDW: 12.6 % (ref 11.5–15.5)
WBC: 1.5 10*3/uL — ABNORMAL LOW (ref 4.0–10.5)
nRBC: 0 % (ref 0.0–0.2)
nRBC: 0 /100 WBC

## 2020-06-23 LAB — COMPREHENSIVE METABOLIC PANEL
ALT: 24 U/L (ref 0–44)
AST: 14 U/L — ABNORMAL LOW (ref 15–41)
Albumin: 3.9 g/dL (ref 3.5–5.0)
Alkaline Phosphatase: 85 U/L (ref 38–126)
Anion gap: 10 (ref 5–15)
BUN: 14 mg/dL (ref 8–23)
CO2: 27 mmol/L (ref 22–32)
Calcium: 9.2 mg/dL (ref 8.9–10.3)
Chloride: 101 mmol/L (ref 98–111)
Creatinine, Ser: 0.7 mg/dL (ref 0.44–1.00)
GFR calc Af Amer: >60 mL/min (ref >60)
GFR calc non Af Amer: >60 mL/min (ref >60)
Glucose, Bld: 94 mg/dL (ref 70–99)
Potassium: 3.9 mmol/L (ref 3.5–5.1)
Sodium: 138 mmol/L (ref 135–145)
Total Bilirubin: 1 mg/dL (ref 0.3–1.2)
Total Protein: 6.6 g/dL (ref 6.5–8.1)

## 2020-06-23 NOTE — Patient Instructions (Addendum)
[place] at [facility] Discharge Instructions  You were seen and examined today by Dr. Delton Coombes. Dr. Delton Coombes discussed your past medical history, family history of cancer and current functional status. 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 This cancer typically responds well to treatment. Dr. Delton Coombes discussed your response to your first chemotherapy and immunotherapy treatment.  Dr. Delton Coombes recommended keeping Imodium on hand at home. Imodium is an anti-diarrheal. You should also keep your mouth moist to prevent mouth sores.   Dr. Delton Coombes agrees with your current treatment plan. You will continue your chemotherapy and immunotherapy followed by surgical resection. The goal is for complete surgical resection, which would mean a very good prognosis.  You should stay away from vitamins during your treatment. You can proceed with your Covid-19 vaccine booster next week, meaning two weeks after your treatment.   Thank you for choosing [place] at [facility] to provide your oncology and hematology care.  To afford each patient quality time with our provider, please arrive at least 15 minutes before your scheduled appointment time.   If you have a lab appointment with the [facility] please come in thru the Main Entrance and check in at the main information desk.  You need to re-schedule your appointment should you arrive 10 or more minutes late.  We strive to give you quality time with our providers, and arriving late affects you and other patients whose appointments are after yours.  Also, if you no show three or more times for appointments you may be dismissed from the clinic at the providers discretion.     Again, thank you for choosing [facility].  Our hope is that these requests will decrease the amount of time that you wait before being seen by our physicians.        _____________________________________________________________  Should you have questions after your visit to [facility], please contact our office at [phone] and follow the prompts.  Our office hours are 8:00 a.m. and 4:30 p.m. Monday - Friday.  Please note that voicemails left after 4:00 p.m. may not be returned until the following business day.  We are closed weekends and major holidays.  You do have access to a nurse 24-7, just call the main number to the clinic [phone] and do not press any options, hold on the line and a nurse will answer the phone.    For prescription refill requests, have your pharmacy contact our office and allow 72 hours.    Due to Covid, you will need to wear a mask upon entering the hospital. If you do not have a mask, a mask will be given to you at the Main Entrance upon arrival. For doctor visits, patients may have 1 support person age 93 or older with them. For treatment visits, patients can not have anyone with them due to social distancing guidelines and our immunocompromised population.

## 2020-06-23 NOTE — Progress Notes (Signed)
[facility] [address] [place] [address]   Patient Care Team: Virgina Norfolk, MD as PCP - General (Nurse Practitioner) Donnelly Angelica, RN as Oncology Nurse Navigator Pershing Proud, RN as Oncology Nurse Navigator Dish[place], Burnett Kanaris, RN as Oncology Nurse Navigator (Oncology)  CHIEF COMPLAINTS/PURPOSE OF CONSULTATION:  Newly diagnosed left breast cancer  HISTORY OF PRESENTING ILLNESS:  Jo Mills 69 y.o. female is here because of recent diagnosis of left breast cancer, referred from [facility].  Today she is accompanied by her husband. She reports that she has already started treatment at [facility]. She reports tolerating the chemo treatments well, though she did have explosive diarrhea on day 3 after chemo with visible blood, but without hematuria. She reports that she has been trying to lose weight before being diagnosed with breast cancer, but now her appetite is decreased from her baseline; she reports feeling abnormal tastes and some sores in her mouth, though she is using baking soda with warm water to rinse her mouth. She is supplementing her meals with 1 can of Ensure daily and drinking 3 quarts of water daily. Her energy levels are good and she is forcing herself to ambulate more and to do her chores. 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 She denies having numbness or tingling. She reports that she has had hot flashes consistently since menopause at the age of 70 years old.  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 She received her second Moderna COVID vaccine on 12/19/2019. Her paternal GM had cancer of some sort; her paternal cousin and second-cousin both had breast cancer.  I reviewed her records extensively and collaborated the history with the patient.  SUMMARY OF ONCOLOGIC HISTORY: Oncology History  Malignant neoplasm of overlapping sites of left breast in female, estrogen receptor negative (HCC)  05/17/2020 Cancer Staging   Staging  form: Breast, AJCC 8th Edition - Clinical stage from 05/17/2020: Stage IIB (cT2, cN0, cM0, G3, ER-, PR-, HER2-) - Signed by Loa Socks, NP on 05/26/2020   05/26/2020 Initial Diagnosis   Patient palpated a left breast mass for 5 months. Mammogram and US showed a 2.7cm mass at the 10:30 position, one left axillary lymph node with cortical thickening, calcifications in the lower inner left breast, and a 0.4cm group of calcifications in the right breast. Biopsy on 05/14/20 showed no malignancy in the right breast, and in the left breast, DCIS, high grade, ER/PR negative. 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    06/16/2020 -  Chemotherapy   The patient had dexamethasone (DECADRON) 4 MG tablet, 1 of 1 cycle, Start date: 06/08/2020, End date: -- DOXOrubicin (ADRIAMYCIN) chemo injection 112 mg, 60 mg/m2 = 112 mg, Intravenous,  Once, 1 of 4 cycles Administration: 112 mg (06/16/2020) palonosetron (ALOXI) injection 0.25 mg, 0.25 mg, Intravenous,  Once, 1 of 8 cycles Administration: 0.25 mg (06/16/2020) pegfilgrastim-bmez (ZIEXTENZO) injection 6 mg, 6 mg, Subcutaneous,  Once, 1 of 4 cycles Administration: 6 mg (06/19/2020) CARBOplatin (PARAPLATIN) in sodium chloride 0.9 % 100 mL chemo infusion, , Intravenous,  Once, 0 of 4 cycles cyclophosphamide (CYTOXAN) 1,120 mg in sodium chloride 0.9 % 250 mL chemo infusion, 600 mg/m2 = 1,120 mg, Intravenous,  Once, 1 of 4 cycles Administration: 1,120 mg (06/16/2020) PACLitaxel (TAXOL) 150 mg in sodium chloride 0.9 %  250 mL chemo infusion (</= $RemoveBefor'80mg'bXuDQVdYXCbp$ /m2), 80 mg/m2, Intravenous,  Once, 0 of 4 cycles fosaprepitant (EMEND) 150 mg in sodium chloride 0.9 % 145 mL IVPB, 150 mg, Intravenous,  Once, 1 of 8 cycles Administration: 150 mg (06/16/2020) pembrolizumab (KEYTRUDA) 200 mg in sodium chloride 0.9 % 50 mL chemo infusion, 150 mg,  Intravenous, Once, 1 of 8 cycles Dose modification: 200 mg (original dose 2 mg/kg, Cycle 2, Reason: Provider Judgment, Comment: flat dose per manufacturer) Administration: 200 mg (06/16/2020)  for chemotherapy treatment.      In terms of breast cancer risk profile:  She menarched at early age of 77 and went to menopause at age 37.  She had 0 pregnancy.  She did receive birth control pills for approximately 8 years.  She was never exposed to fertility medications or hormone replacement therapy.  She has distant family history of Breast/GYN/GI cancer.  MEDICAL HISTORY:  Past Medical History:  Diagnosis Date  . Allergy   . Breast cancer ([place])   . Cancer ([place])   . Diverticulitis   . GERD (gastroesophageal reflux disease)   . Hypertension   . Skin cancer     SURGICAL HISTORY: Past Surgical History:  Procedure Laterality Date  . COLONOSCOPY    . PORTACATH PLACEMENT N/A 06/15/2020   Procedure: INSERTION PORT-A-CATH WITH ULTRASOUND GUIDANCE;  Surgeon: Donnie Mesa, MD;  Location: [facility];  Service: General;  Laterality: Right;  LMA, LEAVE PORT ACCESSED-CHEMO START 9/1    SOCIAL HISTORY: Social History   Socioeconomic History  . Marital status: Married    Spouse name: Not on file  . Number of children: Not on file  . Years of education: Not on file  . Highest education level: Not on file  Occupational History  . Not on file  Tobacco Use  . Smoking status: Never Smoker  . Smokeless tobacco: Never Used  Substance and Sexual Activity  . Alcohol use: Not Currently  . Drug use: Never  . Sexual activity: Not on file  Other Topics Concern  . Not on file  Social History Narrative  . Not on file   Social Determinants of Health   Financial Resource Strain: Low Risk   . Difficulty of Paying Living Expenses: Not hard at all  Food Insecurity: No Food Insecurity  . Worried About Charity fundraiser in the Last Year: Never true  . Ran Out of Food in the Last  Year: Never true  Transportation Needs: No Transportation Needs  . Lack of Transportation (Medical): No  . Lack of Transportation (Non-Medical): No  Physical Activity: Sufficiently Active  . Days of Exercise per Week: 7 days  . Minutes of Exercise per Session: 60 min  Stress: Stress Concern Present  . Feeling of Stress : To some extent  Social Connections: Moderately Integrated  . Frequency of Communication with Friends and Family: Once a week  . Frequency of Social Gatherings with Friends and Family: Never  . Attends Religious Services: More than 4 times per year  . Active Member of Clubs or Organizations: Yes  . Attends Archivist Meetings: 1 to 4 times per year  . Marital Status: Married  Human resources officer Violence: Not At Risk  . Fear of Current or Ex-Partner: No  . Emotionally Abused: No  . Physically Abused: No  . Sexually Abused: No    FAMILY HISTORY: Family History  Problem Relation Age of Onset  . Diverticulitis Mother   . Hypertension Maternal Aunt   .  Hypertension Maternal Uncle   . Cancer Paternal Grandmother   . Breast cancer Cousin     ALLERGIES:  is allergic to grapefruit extract, pseudoephedrine hcl, nickel, pineapple, and amoxicillin.  MEDICATIONS:  Current Outpatient Medications  Medication Sig Dispense Refill  . amLODipine-benazepril (LOTREL) 5-20 MG capsule Take 1 capsule by mouth daily.    . cholecalciferol (VITAMIN D3) 25 MCG (1000 UNIT) tablet Take 5,000 Units by mouth daily.     Marland Kitchen dexamethasone (DECADRON) 4 MG tablet Take 1 tablet day after chemo and 1 tablet 2 days after chemo with food 16 tablet 1  . fexofenadine (ALLEGRA) 180 MG tablet Take 180 mg by mouth daily.    . ondansetron (ZOFRAN) 8 MG tablet Take 1 tablet (8 mg total) by mouth 2 (two) times daily as needed. Start on the third day after chemotherapy. 30 tablet 1  . prochlorperazine (COMPAZINE) 10 MG tablet Take 1 tablet (10 mg total) by mouth every 6 (six) hours as needed (Nausea  or vomiting). 30 tablet 1  . vitamin B-12 (CYANOCOBALAMIN) 500 MCG tablet Take 500 mcg by mouth daily.    Marland Kitchen zinc gluconate 50 MG tablet Take 50 mg by mouth daily.    Marland Kitchen lidocaine-prilocaine (EMLA) cream Apply to affected area once (Patient not taking: Reported on 06/23/2020) 30 g 3  . LORazepam (ATIVAN) 0.5 MG tablet Take 1 tablet (0.5 mg total) by mouth at bedtime as needed for sleep. (Patient not taking: Reported on 06/23/2020) 30 tablet 0   No current facility-administered medications for this visit.    REVIEW OF SYSTEMS:   Review of Systems  Constitutional: Positive for appetite change (moderately decreased) and fatigue (mild).  Eyes: Positive for eye problems.  Cardiovascular: Positive for leg swelling (days 2-4 after chemo).  Gastrointestinal: Positive for blood in stool and nausea.  Endocrine: Positive for hot flashes.  Genitourinary: Negative for hematuria.   Neurological: Negative for numbness.  All other systems reviewed and are negative.   PHYSICAL EXAMINATION: ECOG PERFORMANCE STATUS: 1 - Symptomatic but completely ambulatory  Vitals:   06/23/20 0835  BP: 131/70  Pulse: 66  Resp: 18  Temp: (!) 96.9 F (36.1 C)  SpO2: 98%   Filed Weights   06/23/20 0835  Weight: 161 lb 9.6 oz (73.3 kg)   Physical Exam Vitals reviewed. Exam conducted with a chaperone present.  Constitutional:      Appearance: Normal appearance.  HENT:     Mouth/Throat:     Mouth: Mucous membranes are moist. No oral lesions.     Dentition: No gum lesions.     Tongue: No lesions.     Palate: No lesions.  Cardiovascular:     Rate and Rhythm: Normal rate and regular rhythm.     Pulses: Normal pulses.     Heart sounds: Normal heart sounds.  Pulmonary:     Effort: Pulmonary effort is normal.     Breath sounds: Normal breath sounds.  Chest:     Breasts:        Right: Normal.        Left: Mass (11 o'clock 2 in x 1 in mass) present.     Comments: Port-a-Cath in R chest Abdominal:      Palpations: Abdomen is soft. There is no hepatomegaly, splenomegaly or mass.     Tenderness: There is no abdominal tenderness.     Hernia: No hernia is present.  Neurological:     General: No focal deficit present.     Mental Status:  She is alert and oriented to person, place, and time.  Psychiatric:        Mood and Affect: Mood normal.        Behavior: Behavior normal.      LABORATORY DATA:  I have reviewed the data as listed Recent Results (from the past 2160 hour(s))  SARS CORONAVIRUS 2 (TAT 6-24 HRS) Nasopharyngeal Nasopharyngeal Swab     Status: None   Collection Time: 06/11/20 12:59 PM   Specimen: Nasopharyngeal Swab  Result Value Ref Range   SARS Coronavirus 2 NEGATIVE NEGATIVE    Comment: (NOTE) SARS-CoV-2 target nucleic acids are NOT DETECTED.  The SARS-CoV-2 RNA is generally detectable in upper and lower respiratory specimens during the acute phase of infection. Negative results do not preclude SARS-CoV-2 infection, do not rule out co-infections with other pathogens, and should not be used as the sole basis for treatment or other patient management decisions. Negative results must be combined with clinical observations, patient history, and epidemiological information. The expected result is Negative.  Fact Sheet for Patients: SugarRoll.be  Fact Sheet for Healthcare Providers: https://www.woods-mathews.com/  This test is not yet approved or cleared by the [place] FDA and  has been authorized for detection and/or diagnosis of SARS-CoV-2 by FDA under an Emergency Use Authorization (EUA). This EUA will remain  in effect (meaning this test can be used) for the duration of the COVID-19 declaration under Se ction 564(b)(1) of the Act, 21 U.S.C. section 360bbb-3(b)(1), unless the authorization is terminated or revoked sooner.  Performed at [facility], [place] [address], [place], [place] [address]   CMP ([place] only)     Status: Abnormal   Collection Time: 06/16/20 10:32 AM  Result Value Ref Range   Sodium 141 135 - 145 mmol/L   Potassium 4.1 3.5 - 5.1 mmol/L   Chloride 107 98 - 111 mmol/L   CO2 28 22 - 32 mmol/L   Glucose, Bld 94 70 - 99 mg/dL    Comment: Glucose reference range applies only to samples taken after fasting for at least 8 hours.   BUN 16 8 - 23 mg/dL   Creatinine 0.78 0.44 - 1.00 mg/dL   Calcium 10.7 (H) 8.9 - 10.3 mg/dL   Total Protein 6.6 6.5 - 8.1 g/dL   Albumin 3.7 3.5 - 5.0 g/dL   AST 18 15 - 41 U/L   ALT 15 0 - 44 U/L   Alkaline Phosphatase 93 38 - 126 U/L   Total Bilirubin 0.3 0.3 - 1.2 mg/dL   GFR, Est Non Af Am >60 >60 mL/min   GFR, Est AFR Am >60 >60 mL/min   Anion gap 6 5 - 15    Comment: Performed at [facility], [place] [address] [place], [place] [address]  CBC with Differential (Center Only)     Status: Abnormal   Collection Time: 06/16/20 10:32 AM  Result Value Ref Range   WBC Count 14.9 (H) 4.0 - 10.5 K/uL   RBC 4.03 3.87 - 5.11 MIL/uL   Hemoglobin 12.6 12.0 - 15.0 g/dL   HCT 37.5 36 - 46 %   MCV 93.1 80.0 - 100.0 fL   MCH 31.3 26.0 - 34.0 pg   MCHC 33.6 30.0 - 36.0 g/dL   RDW 13.1 11.5 - 15.5 %   Platelet Count 267 150 - 400 K/uL   nRBC 0.0 0.0 - 0.2 %   Neutrophils Relative % 76 %   Neutro Abs  11.2 (H) 1.7 - 7.7 K/uL   Lymphocytes Relative 19 %   Lymphs Abs 2.8 0.7 - 4.0 K/uL   Monocytes Relative 5 %   Monocytes Absolute 0.8 0 - 1 K/uL   Eosinophils Relative 0 %   Eosinophils Absolute 0.0 0 - 0 K/uL   Basophils Relative 0 %   Basophils Absolute 0.0 0 - 0 K/uL   Immature Granulocytes 0 %   Abs Immature Granulocytes 0.06 0.00 - 0.07 K/uL    Comment: Performed at [facility], [place] [address] [place], [place] [address]  TSH     Status: Abnormal   Collection Time: 06/16/20 12:32 PM  Result Value Ref Range   TSH 0.116 (L) 0.308 - 3.960 uIU/mL    Comment: Performed at [facility], [place] [address] [place], [place] [address]  Comprehensive metabolic panel     Status: Abnormal   Collection Time: 06/23/20  8:09 AM  Result Value Ref Range   Sodium 138 135 - 145 mmol/L   Potassium 3.9 3.5 - 5.1 mmol/L   Chloride 101 98 - 111 mmol/L   CO2 27 22 - 32 mmol/L   Glucose, Bld 94 70 - 99 mg/dL    Comment: Glucose reference range applies only to samples taken after fasting for at least 8 hours.   BUN 14 8 - 23 mg/dL   Creatinine, Ser 0.70 0.44 - 1.00 mg/dL   Calcium 9.2 8.9 - 10.3 mg/dL   Total Protein 6.6 6.5 - 8.1 g/dL   Albumin 3.9 3.5 - 5.0 g/dL   AST 14 (L) 15 - 41 U/L   ALT 24 0 - 44 U/L   Alkaline Phosphatase 85 38 - 126 U/L   Total Bilirubin 1.0 0.3 - 1.2 mg/dL   GFR calc non Af Amer >60 >60 mL/min   GFR calc Af Amer >60 >60 mL/min   Anion gap 10 5 - 15    Comment: Performed at [facility], [address] [place], [place] [address]  CBC with Differential     Status: Abnormal   Collection Time: 06/23/20  8:09 AM  Result Value Ref Range   WBC 1.5 (L) 4.0 - 10.5 K/uL   RBC 4.37 3.87 - 5.11 MIL/uL   Hemoglobin 13.5 12.0 - 15.0 g/dL   HCT 41.4 36 - 46 %   MCV 94.7 80.0 - 100.0 fL   MCH 30.9 26.0 - 34.0 pg   MCHC 32.6 30.0 - 36.0 g/dL   RDW 12.6 11.5 - 15.5 %   Platelets 145 (L) 150 - 400 K/uL   nRBC 0.0 0.0 - 0.2 %   Neutrophils Relative % 30 %   Lymphocytes Relative 55 %   Monocytes Relative 5 %   Eosinophils Relative 7 %   Basophils Relative 2 %   Band Neutrophils 0 %   Metamyelocytes Relative 0 %   Myelocytes 0 %   Promyelocytes Relative 0 %   Blasts 0 %   nRBC 0 0 /100 WBC   Other 1 %   Neutro Abs 0.5 (L) 1.7 - 7.7 K/uL   Lymphs Abs 0.8 0.7 - 4.0 K/uL   Monocytes Absolute 0.1 0 - 1 K/uL   Eosinophils Absolute 0.1 0 - 0 K/uL   Basophils Absolute 0.0 0 - 0 K/uL   Abs Immature Granulocytes 0.00 0.00 - 0.07 K/uL    Comment: Performed at [facility], [address], [place], [place] [address]  RADIOGRAPHIC  STUDIES: I have personally reviewed the radiological reports and agreed with the findings in the report. 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 Additional biopsies in the right breast and of a left axillary lymph node demonstrated benign findings. LABS:  None performed density. EXAM: BILATERAL BREAST MRI WITH AND WITHOUT CONTRAST TECHNIQUE: Multiplanar, multisequence MR images of both breasts were obtained prior to and following the intravenous administration of 8 ml of Gadavist. Three-dimensional MR images were rendered by post-processing of the original MR data on an independent workstation. The three-dimensional MR images were interpreted, and findings are reported in the following complete MRI report for this study. Three dimensional images were evaluated at the independent interpreting workstation using the DynaCAD thin client. COMPARISON:  Previous exam(s). FINDINGS: Breast composition: a. Almost entirely fat. Background parenchymal enhancement: Mild. Right breast: No suspicious mass or abnormal enhancement. Susceptibility artifact from post biopsy clip is demonstrated in the upper inner quadrant at middle depth. This is consistent with a site of benign biopsy. Left breast: Post biopsy related changes are seen in association with a spiculated, heterogeneously enhancing mass in the upper inner left breast at middle depth (series 5, image 56/144). The mass measures 3.8 x 3.3 x 3.2 cm. An additional area of a post biopsy related changes is demonstrated in the upper inner quadrant at anterior depth (series 5, image 293/144). This is consistent with the site of biopsied calcifications demonstrating high-grade DCIS. No additional suspicious findings in the remainder of the  left breast.). Lymph nodes: No abnormal appearing lymph nodes. Ancillary findings:  None. IMPRESSION: 1. 3.8 cm enhancing mass in the upper inner left breast consistent with the patient's biopsy-proven site of invasive malignancy. 2. 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 3. No MRI evidence of malignancy on the right. 4. No suspicious lymphadenopathy. RECOMMENDATION: Per clinical treatment plan. BI-RADS CATEGORY  6: Known biopsy-proven malignancy. Electronically Signed   By: Kristopher Oppenheim M.D.   On: 06/07/2020 11:33   DG CHEST PORT 1 VIEW  Result Date: 06/15/2020 CLINICAL DATA:  Port-A-Cath placement EXAM: PORTABLE CHEST 1 VIEW COMPARISON:  Portable exam 1245 hours without priors for comparison FINDINGS: RIGHT jugular Port-A-Cath with tip projecting over SVC. Normal heart size, mediastinal contours, and pulmonary vascularity. Lungs clear. No acute infiltrate, pleural effusion or pneumothorax. Bones demineralized. IMPRESSION: No pneumothorax following RIGHT jugular Port-A-Cath insertion. Electronically Signed   By: Lavonia Dana M.D.   On: 06/15/2020 13:08   DG Fluoro Guide CV Line-No Report  Result Date: 06/15/2020 Fluoroscopy was utilized by the requesting physician.  No radiographic interpretation.   ECHOCARDIOGRAM COMPLETE  Result Date: 06/09/2020    ECHOCARDIOGRAM REPORT   Patient Name:   Jo Mills Date of Exam: 06/09/2020 Medical Rec #:  [id number]    Height:       66.0 in Accession #:    [id number]   Weight:       166.0 lb Date of Birth:  [birth year]   BSA:          1.848 m Patient Age:    20 years     BP:           135/83 mmHg Patient Gender: F            HR:  67 bpm. Exam Location:  [facility] Procedure: 2D Echo Indications:    Chemo V67.2 / Z09  History:        Patient has no prior history of Echocardiogram examinations.                 Risk Factors:Non-Smoker and Hypertension. Breast Cancer.  Sonographer:    Leavy Cella RDCS (AE) Referring Phys:  [id number] Nicholas Lose IMPRESSIONS  1. Left ventricular ejection fraction, by estimation, is 60 to 65%. The left ventricle has normal function. The left ventricle has no regional wall motion abnormalities. There is mild left ventricular hypertrophy. Left ventricular diastolic parameters are consistent with Grade I diastolic dysfunction (impaired relaxation).  2. Right ventricular systolic function is normal. The right ventricular size is normal. There is normal pulmonary artery systolic pressure.  3. The mitral valve is normal in structure. No evidence of mitral valve regurgitation. No evidence of mitral stenosis.  4. The aortic valve has an indeterminant number of cusps. Aortic valve regurgitation is not visualized. No aortic stenosis is present.  5. The inferior vena cava is normal in size with greater than 50% respiratory variability, suggesting right atrial pressure of 3 mmHg. FINDINGS  Left Ventricle: Left ventricular ejection fraction, by estimation, is 60 to 65%. The left ventricle has normal function. The left ventricle has no regional wall motion abnormalities. The left ventricular internal cavity size was normal in size. There is  mild left ventricular hypertrophy. Left ventricular diastolic parameters are consistent with Grade I diastolic dysfunction (impaired relaxation). Right Ventricle: The right ventricular size is normal. No increase in right ventricular wall thickness. Right ventricular systolic function is normal. There is normal pulmonary artery systolic pressure. The tricuspid regurgitant velocity is 2.14 m/s, and  with an assumed right atrial pressure of 10 mmHg, the estimated right ventricular systolic pressure is 51.0 mmHg. Left Atrium: Left atrial size was normal in size. Right Atrium: Right atrial size was normal in size. Pericardium: There is no evidence of pericardial effusion. Mitral Valve: The mitral valve is normal in structure. No evidence of mitral valve regurgitation. No evidence of  mitral valve stenosis. Tricuspid Valve: The tricuspid valve is normal in structure. Tricuspid valve regurgitation is not demonstrated. No evidence of tricuspid stenosis. Aortic Valve: The aortic valve has an indeterminant number of cusps. Aortic valve regurgitation is not visualized. No aortic stenosis is present. Aortic valve mean gradient measures 3.5 mmHg. Aortic valve peak gradient measures 8.8 mmHg. Aortic valve area, by VTI measures 1.99 cm. Pulmonic Valve: The pulmonic valve was not well visualized. Pulmonic valve regurgitation is not visualized. No evidence of pulmonic stenosis. Aorta: The aortic root is normal in size and structure. Venous: The inferior vena cava is normal in size with greater than 50% respiratory variability, suggesting right atrial pressure of 3 mmHg. IAS/Shunts: No atrial level shunt detected by color flow Doppler.  LEFT VENTRICLE PLAX 2D LVIDd:         3.99 cm  Diastology LVIDs:         1.83 cm  LV e' lateral:   7.72 cm/s LV PW:         1.10 cm  LV E/e' lateral: 8.6 LV IVS:        1.06 cm  LV e' medial:    5.00 cm/s LVOT diam:     1.90 cm  LV E/e' medial:  13.3 LV SV:         52 LV SV Index:   28 LVOT Area:  2.84 cm  RIGHT VENTRICLE RV S prime:     16.30 cm/s TAPSE (M-mode): 2.7 cm LEFT ATRIUM             Index       RIGHT ATRIUM           Index LA diam:        3.90 cm 2.11 cm/m  RA Area:     11.10 cm LA Vol (A2C):   40.3 ml 21.81 ml/m RA Volume:   24.80 ml  13.42 ml/m LA Vol (A4C):   29.5 ml 15.97 ml/m LA Biplane Vol: 35.9 ml 19.43 ml/m  AORTIC VALVE AV Area (Vmax):    1.95 cm AV Area (Vmean):   2.26 cm AV Area (VTI):     1.99 cm AV Vmax:           148.55 cm/s AV Vmean:          83.402 cm/s AV VTI:            0.264 m AV Peak Grad:      8.8 mmHg AV Mean Grad:      3.5 mmHg LVOT Vmax:         102.40 cm/s LVOT Vmean:        66.550 cm/s LVOT VTI:          0.185 m LVOT/AV VTI ratio: 0.70  AORTA Ao Root diam: 2.70 cm MITRAL VALVE               TRICUSPID VALVE MV Area (PHT):  3.01 cm    TR Peak grad:   18.3 mmHg MV Decel Time: 252 msec    TR Vmax:        214.00 cm/s MV E velocity: 66.40 cm/s MV A velocity: 75.80 cm/s  SHUNTS MV E/A ratio:  0.88        Systemic VTI:  0.19 m                            Systemic Diam: 1.90 cm Dina Rich MD Electronically signed by Dina Rich MD Signature Date/Time: 06/09/2020/4:03:57 PM    Final      ASSESSMENT:  1.  Stage II (T2N0) left breast TNBC: -Felt left breast mass for 5 months. -Mammogram on 05/07/2020 with suspicious mass in the 10;30 o'clock position in the left breast.  Suspicious calcifications in the anterior lower inner quadrant of the left breast. -MRI on 06/05/2020 shows left breast mass measuring 3.8 x 3.3 x 3.2 cm in the upper inner quadrant.  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  No suspicious lymphadenopathy.  No MRI evidence of malignancy on the right. -Left breast upper inner quadrant biopsy on 05/14/2020 showed DCIS with calcifications.  Right breast biopsy shows fibroadenomatoid nodule with calcifications.  ER/PR was negative. -Left breast core needle biopsy at 10:30 position shows invasive ductal carcinoma.  ER/PR/HER-2 was negative.  Ki-67 85%.  Left lymph node biopsy was reactive. -Patient was evaluated by Dr. Pamelia Hoit and Dr. Corliss Skains. -She received first cycle of chemotherapy with Adriamycin and cyclophosphamide with pembrolizumab (keynote-522 trial).   PLAN:  1.  Triple negative left breast cancer: -Received first cycle of Adriamycin, cyclophosphamide on 10/17/2019 along with pembrolizumab. -Had 1 day of diarrhea which resolved.  She also had tiredness for couple of days but continue to work. -We reviewed the treatment plan in detail.  Addition of pembrolizumab was associated with improved PCR by  about 15%.  We talked about immunotherapy related side effects to watch for. -I have reviewed her labs from today.  She has leukopenia with ANC of 0.5.  She received pegfilgrastim.   She will come back in 2 weeks for her cycle 2. -She was also recommended to have COVID-19 vaccine.  2.  Family history: -Paternal cousin and second cousin had breast cancer.  Paternal grandmother also had some type of cancer. -Because of triple negative nature, I will recommend genetic testing.  All questions were answered. The patient knows to call the clinic with any problems, questions or concerns.   Derek Jack, MD 06/23/20 9:42 AM  [place] [phone]   I, Milinda Antis, am acting as a scribe for Dr. Sanda Linger.  I, Derek Jack MD, have reviewed the above documentation for accuracy and completeness, and I agree with the above.

## 2020-06-24 LAB — PATHOLOGIST SMEAR REVIEW

## 2020-06-25 ENCOUNTER — Ambulatory Visit (HOSPITAL_COMMUNITY): Payer: BC Managed Care – PPO

## 2020-06-30 ENCOUNTER — Other Ambulatory Visit (HOSPITAL_COMMUNITY): Payer: Self-pay

## 2020-06-30 DIAGNOSIS — Z171 Estrogen receptor negative status [ER-]: Secondary | ICD-10-CM

## 2020-06-30 DIAGNOSIS — C50812 Malignant neoplasm of overlapping sites of left female breast: Secondary | ICD-10-CM

## 2020-07-07 ENCOUNTER — Inpatient Hospital Stay (HOSPITAL_COMMUNITY): Payer: BC Managed Care – PPO

## 2020-07-07 ENCOUNTER — Inpatient Hospital Stay (HOSPITAL_COMMUNITY): Payer: BC Managed Care – PPO | Admitting: Hematology

## 2020-07-07 ENCOUNTER — Other Ambulatory Visit: Payer: Self-pay

## 2020-07-07 ENCOUNTER — Encounter (HOSPITAL_COMMUNITY): Payer: Self-pay | Admitting: Hematology

## 2020-07-07 VITALS — BP 133/79 | HR 76 | Temp 97.1°F | Resp 17 | Wt 161.6 lb

## 2020-07-07 VITALS — BP 108/51 | HR 61 | Temp 97.3°F | Resp 17

## 2020-07-07 DIAGNOSIS — Z171 Estrogen receptor negative status [ER-]: Secondary | ICD-10-CM

## 2020-07-07 DIAGNOSIS — C50812 Malignant neoplasm of overlapping sites of left female breast: Secondary | ICD-10-CM

## 2020-07-07 DIAGNOSIS — Z5112 Encounter for antineoplastic immunotherapy: Secondary | ICD-10-CM | POA: Diagnosis not present

## 2020-07-07 LAB — COMPREHENSIVE METABOLIC PANEL
ALT: 22 U/L (ref 0–44)
AST: 21 U/L (ref 15–41)
Albumin: 3.6 g/dL (ref 3.5–5.0)
Alkaline Phosphatase: 78 U/L (ref 38–126)
Anion gap: 9 (ref 5–15)
BUN: 14 mg/dL (ref 8–23)
CO2: 24 mmol/L (ref 22–32)
Calcium: 9.2 mg/dL (ref 8.9–10.3)
Chloride: 105 mmol/L (ref 98–111)
Creatinine, Ser: 0.69 mg/dL (ref 0.44–1.00)
GFR calc Af Amer: >60 mL/min (ref >60)
GFR calc non Af Amer: >60 mL/min (ref >60)
Glucose, Bld: 136 mg/dL — ABNORMAL HIGH (ref 70–99)
Potassium: 3.7 mmol/L (ref 3.5–5.1)
Sodium: 138 mmol/L (ref 135–145)
Total Bilirubin: 0.6 mg/dL (ref 0.3–1.2)
Total Protein: 6.3 g/dL — ABNORMAL LOW (ref 6.5–8.1)

## 2020-07-07 LAB — CBC WITH DIFFERENTIAL/PLATELET
Abs Immature Granulocytes: 0.05 10*3/uL (ref 0.00–0.07)
Basophils Absolute: 0.1 10*3/uL (ref 0.0–0.1)
Basophils Relative: 2 %
Eosinophils Absolute: 0 10*3/uL (ref 0.0–0.5)
Eosinophils Relative: 0 %
HCT: 36.5 % (ref 36.0–46.0)
Hemoglobin: 12.2 g/dL (ref 12.0–15.0)
Immature Granulocytes: 1 %
Lymphocytes Relative: 27 %
Lymphs Abs: 1.7 10*3/uL (ref 0.7–4.0)
MCH: 31.8 pg (ref 26.0–34.0)
MCHC: 33.4 g/dL (ref 30.0–36.0)
MCV: 95.1 fL (ref 80.0–100.0)
Monocytes Absolute: 0.7 10*3/uL (ref 0.1–1.0)
Monocytes Relative: 11 %
Neutro Abs: 4 10*3/uL (ref 1.7–7.7)
Neutrophils Relative %: 59 %
Platelets: 364 10*3/uL (ref 150–400)
RBC: 3.84 MIL/uL — ABNORMAL LOW (ref 3.87–5.11)
RDW: 13.6 % (ref 11.5–15.5)
WBC: 6.6 10*3/uL (ref 4.0–10.5)
nRBC: 0 % (ref 0.0–0.2)

## 2020-07-07 MED ORDER — SODIUM CHLORIDE 0.9 % IV SOLN
600.0000 mg/m2 | Freq: Once | INTRAVENOUS | Status: AC
  Administered 2020-07-07: 1120 mg via INTRAVENOUS
  Filled 2020-07-07: qty 56

## 2020-07-07 MED ORDER — SODIUM CHLORIDE 0.9% FLUSH
10.0000 mL | INTRAVENOUS | Status: DC | PRN
  Administered 2020-07-07: 10 mL

## 2020-07-07 MED ORDER — PALONOSETRON HCL INJECTION 0.25 MG/5ML
INTRAVENOUS | Status: AC
  Filled 2020-07-07: qty 5

## 2020-07-07 MED ORDER — HEPARIN SOD (PORK) LOCK FLUSH 100 UNIT/ML IV SOLN
500.0000 [IU] | Freq: Once | INTRAVENOUS | Status: AC | PRN
  Administered 2020-07-07: 500 [IU]

## 2020-07-07 MED ORDER — SODIUM CHLORIDE 0.9 % IV SOLN
200.0000 mg | Freq: Once | INTRAVENOUS | Status: AC
  Administered 2020-07-07: 200 mg via INTRAVENOUS
  Filled 2020-07-07: qty 8

## 2020-07-07 MED ORDER — SODIUM CHLORIDE 0.9 % IV SOLN
10.0000 mg | Freq: Once | INTRAVENOUS | Status: AC
  Administered 2020-07-07: 10 mg via INTRAVENOUS
  Filled 2020-07-07: qty 10

## 2020-07-07 MED ORDER — DOXORUBICIN HCL CHEMO IV INJECTION 2 MG/ML
60.0000 mg/m2 | Freq: Once | INTRAVENOUS | Status: AC
  Administered 2020-07-07: 112 mg via INTRAVENOUS
  Filled 2020-07-07: qty 56

## 2020-07-07 MED ORDER — SODIUM CHLORIDE 0.9 % IV SOLN
150.0000 mg | Freq: Once | INTRAVENOUS | Status: AC
  Administered 2020-07-07: 150 mg via INTRAVENOUS
  Filled 2020-07-07: qty 150

## 2020-07-07 MED ORDER — PALONOSETRON HCL INJECTION 0.25 MG/5ML
0.2500 mg | Freq: Once | INTRAVENOUS | Status: AC
  Administered 2020-07-07: 0.25 mg via INTRAVENOUS

## 2020-07-07 MED ORDER — SODIUM CHLORIDE 0.9 % IV SOLN: Freq: Once | INTRAVENOUS | Status: AC

## 2020-07-07 NOTE — Progress Notes (Signed)
[place],  [address]   CLINIC:  Medical Oncology/Hematology  PCP:  Roderic Scarce, MD [address] [name] / [place] [address] [phone]   REASON FOR VISIT:  Follow-up for triple negative left breast cancer  PRIOR THERAPY: None  NGS Results: ER/PR/HER-2 negative, Ki-67 85%  CURRENT THERAPY: AC & Keytruda  BRIEF ONCOLOGIC HISTORY:  Oncology History  Malignant neoplasm of overlapping sites of left breast in female, estrogen receptor negative ([place])  05/17/2020 Cancer Staging   Staging form: Breast, AJCC 8th Edition - Clinical stage from 05/17/2020: Stage IIB (cT2, cN0, cM0, G3, ER-, PR-, HER2-) - Signed by Gardenia Phlegm, NP on 05/26/2020   05/26/2020 Initial Diagnosis   Patient palpated a left breast mass for 5 months. Mammogram and US showed a 2.7cm mass at the 10:30 position, one left axillary lymph node with cortical thickening, calcifications in the lower inner left breast, and a 0.4cm group of calcifications in the right breast. Biopsy on 05/14/20 showed no malignancy in the right breast, and in the left breast, DCIS, high grade, ER/PR negative. 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    06/16/2020 -  Chemotherapy   The patient had dexamethasone (DECADRON) 4 MG tablet, 1 of 1 cycle, Start date: 06/08/2020, End date: -- DOXOrubicin (ADRIAMYCIN) chemo injection 112 mg, 60 mg/m2 = 112 mg, Intravenous,  Once, 2 of 4 cycles Administration: 112 mg (06/16/2020) palonosetron (ALOXI) injection 0.25 mg, 0.25 mg, Intravenous,  Once, 2 of 8 cycles Administration: 0.25 mg (06/16/2020) pegfilgrastim-bmez (ZIEXTENZO) injection 6 mg, 6 mg, Subcutaneous,  Once, 2 of 4 cycles Administration: 6 mg (06/19/2020) CARBOplatin (PARAPLATIN) in sodium chloride 0.9 % 100 mL chemo infusion, , Intravenous,  Once, 0 of 4  cycles cyclophosphamide (CYTOXAN) 1,120 mg in sodium chloride 0.9 % 250 mL chemo infusion, 600 mg/m2 = 1,120 mg, Intravenous,  Once, 2 of 4 cycles Administration: 1,120 mg (06/16/2020) PACLitaxel (TAXOL) 150 mg in sodium chloride 0.9 % 250 mL chemo infusion (</= 65m/m2), 80 mg/m2, Intravenous,  Once, 0 of 4 cycles fosaprepitant (EMEND) 150 mg in sodium chloride 0.9 % 145 mL IVPB, 150 mg, Intravenous,  Once, 2 of 8 cycles Administration: 150 mg (06/16/2020) pembrolizumab (KEYTRUDA) 200 mg in sodium chloride 0.9 % 50 mL chemo infusion, 150 mg, Intravenous, Once, 2 of 8 cycles Dose modification: 200 mg (original dose 2 mg/kg, Cycle 2, Reason: Provider Judgment, Comment: flat dose per manufacturer) Administration: 200 mg (06/16/2020)  for chemotherapy treatment.      CANCER STAGING: Cancer Staging Malignant neoplasm of overlapping sites of left breast in female, estrogen receptor negative ([place] Staging form: Breast, AJCC 8th Edition - Clinical stage from 05/17/2020: Stage IIB (cT2, cN0, cM0, G3, ER-, PR-, HER2-) - Signed by CGardenia Phlegm NP on 05/26/2020   INTERVAL HISTORY:  Ms. LPrapti Grussing a 69y.o. female, returns for routine follow-up and consideration for next cycle of chemotherapy. LEviannawas last seen on 06/23/2020.  Due for cycle #2 of AC and Keytruda today.   Today she is accompanied by her husband. Overall, she tells me she has been feeling pretty well. She is tolerating the treatments well and reports that she is no longer able to feel the lump in her breast. She complains of loose stools mixed with softly formed stools due to her chronic diverticulitis.  She denies having sore in her mouth, though her mouth is dry.  She reports that her paternal cousin had some kind of leukemia while another paternal cousin died from pancreatic cancer.  Overall, she feels ready for next cycle of chemo today.    REVIEW OF SYSTEMS:  Review of Systems  Constitutional: Positive for appetite  change (mildly decreased). Negative for fatigue.  HENT:   Negative for sore throat.   All other systems reviewed and are negative.   PAST MEDICAL/SURGICAL HISTORY:  Past Medical History:  Diagnosis Date  . Allergy   . Breast cancer ([place])   . Cancer ([place])   . Diverticulitis   . GERD (gastroesophageal reflux disease)   . Hypertension   . Skin cancer    Past Surgical History:  Procedure Laterality Date  . COLONOSCOPY    . PORTACATH PLACEMENT N/A 06/15/2020   Procedure: INSERTION PORT-A-CATH WITH ULTRASOUND GUIDANCE;  Surgeon: Donnie Mesa, MD;  Location: [facility];  Service: General;  Laterality: Right;  LMA, LEAVE PORT ACCESSED-CHEMO START 9/1    SOCIAL HISTORY:  Social History   Socioeconomic History  . Marital status: Married    Spouse name: Not on file  . Number of children: Not on file  . Years of education: Not on file  . Highest education level: Not on file  Occupational History  . Not on file  Tobacco Use  . Smoking status: Never Smoker  . Smokeless tobacco: Never Used  Substance and Sexual Activity  . Alcohol use: Not Currently  . Drug use: Never  . Sexual activity: Not on file  Other Topics Concern  . Not on file  Social History Narrative  . Not on file   Social Determinants of Health   Financial Resource Strain: Low Risk   . Difficulty of Paying Living Expenses: Not hard at all  Food Insecurity: No Food Insecurity  . Worried About Charity fundraiser in the Last Year: Never true  . Ran Out of Food in the Last Year: Never true  Transportation Needs: No Transportation Needs  . Lack of Transportation (Medical): No  . Lack of Transportation (Non-Medical): No  Physical Activity: Sufficiently Active  . Days of Exercise per Week: 7 days  . Minutes of Exercise per Session: 60 min  Stress: Stress Concern Present  . Feeling of Stress : To some extent  Social Connections: Moderately Integrated  . Frequency of Communication with Friends and  Family: Once a week  . Frequency of Social Gatherings with Friends and Family: Never  . Attends Religious Services: More than 4 times per year  . Active Member of Clubs or Organizations: Yes  . Attends Archivist Meetings: 1 to 4 times per year  . Marital Status: Married  Human resources officer Violence: Not At Risk  . Fear of Current or Ex-Partner: No  . Emotionally Abused: No  . Physically Abused: No  . Sexually Abused: No    FAMILY HISTORY:  Family History  Problem Relation Age of Onset  . Diverticulitis Mother   . Hypertension Maternal Aunt   . Hypertension Maternal Uncle   . Cancer Paternal Grandmother   . Breast cancer Cousin     CURRENT MEDICATIONS:  Current Outpatient Medications  Medication Sig Dispense Refill  . Bismuth Subsalicylate (PEPTO-BISMOL PO) Take by mouth as needed.    . Lactobacillus-Inulin (CULTURELLE DIGESTIVE DAILY) CAPS Take 1 capsule by mouth daily.    Marland Kitchen lidocaine-prilocaine (EMLA) cream Apply to affected area once  30 g 3  . omeprazole (PRILOSEC) 20 MG capsule Take 20 mg by mouth daily.    Marland Kitchen amLODipine-benazepril (LOTREL) 5-20 MG capsule Take 1 capsule by mouth daily.    Marland Kitchen dexamethasone (DECADRON) 4 MG tablet Take 1 tablet day after chemo and 1 tablet 2 days after chemo with food 16 tablet 1  . fexofenadine (ALLEGRA) 180 MG tablet Take 180 mg by mouth daily.    Marland Kitchen LORazepam (ATIVAN) 0.5 MG tablet Take 1 tablet (0.5 mg total) by mouth at bedtime as needed for sleep. (Patient not taking: Reported on 06/23/2020) 30 tablet 0  . ondansetron (ZOFRAN) 8 MG tablet Take 1 tablet (8 mg total) by mouth 2 (two) times daily as needed. Start on the third day after chemotherapy. 30 tablet 1  . prochlorperazine (COMPAZINE) 10 MG tablet Take 1 tablet (10 mg total) by mouth every 6 (six) hours as needed (Nausea or vomiting). 30 tablet 1   No current facility-administered medications for this visit.   Facility-Administered Medications Ordered in Other Visits   Medication Dose Route Frequency Provider Last Rate Last Admin  . 0.9 %  sodium chloride infusion   Intravenous Once Nicholas Lose, MD      . cyclophosphamide (CYTOXAN) 1,120 mg in sodium chloride 0.9 % 250 mL chemo infusion  600 mg/m2 (Treatment Plan Recorded) Intravenous Once Nicholas Lose, MD      . dexamethasone (DECADRON) 10 mg in sodium chloride 0.9 % 50 mL IVPB  10 mg Intravenous Once Nicholas Lose, MD      . DOXOrubicin (ADRIAMYCIN) chemo injection 112 mg  60 mg/m2 (Treatment Plan Recorded) Intravenous Once Nicholas Lose, MD      . fosaprepitant (EMEND) 150 mg in sodium chloride 0.9 % 145 mL IVPB  150 mg Intravenous Once Nicholas Lose, MD      . heparin lock flush 100 unit/mL  500 Units Intracatheter Once PRN Nicholas Lose, MD      . palonosetron (ALOXI) injection 0.25 mg  0.25 mg Intravenous Once Nicholas Lose, MD      . pembrolizumab (KEYTRUDA) 200 mg in sodium chloride 0.9 % 50 mL chemo infusion  200 mg Intravenous Once Nicholas Lose, MD      . sodium chloride flush (NS) 0.9 % injection 10 mL  10 mL Intracatheter PRN Nicholas Lose, MD        ALLERGIES:  Allergies  Allergen Reactions  . Grapefruit Extract Other (See Comments)    Other Reaction: Other reaction  . Pseudoephedrine Hcl Other (See Comments)    Other Reaction: Other reaction  . Nickel   . Pineapple   . Amoxicillin Rash    PHYSICAL EXAM:  Performance status (ECOG): 1 - Symptomatic but completely ambulatory  Vitals:   07/07/20 0915  BP: 133/79  Pulse: 76  Resp: 17  Temp: (!) 97.1 F (36.2 C)  SpO2: 97%   Wt Readings from Last 3 Encounters:  07/07/20 161 lb 9.6 oz (73.3 kg)  06/23/20 161 lb 9.6 oz (73.3 kg)  06/16/20 170 lb 14.4 oz (77.5 kg)   Physical Exam Vitals reviewed.  Constitutional:      Appearance: Normal appearance.  Cardiovascular:     Rate and Rhythm: Normal rate and regular rhythm.     Pulses: Normal pulses.     Heart sounds: Normal heart sounds.  Pulmonary:     Effort: Pulmonary  effort is normal.     Breath sounds: Normal breath sounds.  Chest:     Breasts:  Right: No mass.        Left: No mass, skin change or tenderness.     Comments: Port-a-Cath in R chest Neurological:     General: No focal deficit present.     Mental Status: She is alert and oriented to person, place, and time.  Psychiatric:        Mood and Affect: Mood normal.        Behavior: Behavior normal.     LABORATORY DATA:  I have reviewed the labs as listed.  CBC Latest Ref Rng & Units 07/07/2020 06/23/2020 06/16/2020  WBC 4.0 - 10.5 K/uL 6.6 1.5(L) 14.9(H)  Hemoglobin 12.0 - 15.0 g/dL 12.2 13.5 12.6  Hematocrit 36 - 46 % 36.5 41.4 37.5  Platelets 150 - 400 K/uL 364 145(L) 267   CMP Latest Ref Rng & Units 07/07/2020 06/23/2020 06/16/2020  Glucose 70 - 99 mg/dL 136(H) 94 94  BUN 8 - 23 mg/dL _0 Creatinine 0.44 - 1.00 mg/dL 0.69 0.70 0.78  Sodium 135 - 145 mmol/L 138 138 141  Potassium 3.5 - 5.1 mmol/L 3.7 3.9 4.1  Chloride 98 - 111 mmol/L 105 101 107  CO2 22 - 32 mmol/L _1 Calcium 8.9 - 10.3 mg/dL 9.2 9.2 10.7(H)  Total Protein 6.5 - 8.1 g/dL 6.3(L) 6.6 6.6  Total Bilirubin 0.3 - 1.2 mg/dL 0.6 1.0 0.3  Alkaline Phos 38 - 126 U/L 78 85 93  AST 15 - 41 U/L 21 14(L) 18  ALT 0 - 44 U/L _2 DIAGNOSTIC IMAGING:  I have independently reviewed the scans and discussed with the patient. DG CHEST PORT 1 VIEW  Result Date: 06/15/2020 CLINICAL DATA:  Port-A-Cath placement EXAM: PORTABLE CHEST 1 VIEW COMPARISON:  Portable exam 1245 hours without priors for comparison FINDINGS: RIGHT jugular Port-A-Cath with tip projecting over SVC. Normal heart size, mediastinal contours, and pulmonary vascularity. Lungs clear. No acute infiltrate, pleural effusion or pneumothorax. Bones demineralized. IMPRESSION: No pneumothorax following RIGHT jugular Port-A-Cath insertion. Electronically Signed   By: Lavonia Dana M.D.   On: 06/15/2020 13:08   DG Fluoro Guide CV Line-No Report  Result Date:  06/15/2020 Fluoroscopy was utilized by the requesting physician.  No radiographic interpretation.   ECHOCARDIOGRAM COMPLETE  Result Date: 06/09/2020    ECHOCARDIOGRAM REPORT   Patient Name:   SENG FOUTS Date of Exam: 06/09/2020 Medical Rec #:  [id number]    Height:       66.0 in Accession #:    [id number]   Weight:       166.0 lb Date of Birth:  02/21/1951   BSA:          1.848 m Patient Age:    69 years     BP:           135/83 mmHg Patient Gender: F            HR:           67 bpm. Exam Location:  [facility] Procedure: 2D Echo Indications:    Chemo V67.2 / Z09  History:        Patient has no prior history of Echocardiogram examinations.                 Risk Factors:Non-Smoker and Hypertension. Breast Cancer.  Sonographer:    Leavy Cella RDCS (AE) Referring Phys: [id number] Nicholas Lose IMPRESSIONS  1. Left ventricular ejection fraction, by estimation, is 60 to 65%. The left  ventricle has normal function. The left ventricle has no regional wall motion abnormalities. There is mild left ventricular hypertrophy. Left ventricular diastolic parameters are consistent with Grade I diastolic dysfunction (impaired relaxation).  2. Right ventricular systolic function is normal. The right ventricular size is normal. There is normal pulmonary artery systolic pressure.  3. The mitral valve is normal in structure. No evidence of mitral valve regurgitation. No evidence of mitral stenosis.  4. The aortic valve has an indeterminant number of cusps. Aortic valve regurgitation is not visualized. No aortic stenosis is present.  5. The inferior vena cava is normal in size with greater than 50% respiratory variability, suggesting right atrial pressure of 3 mmHg. FINDINGS  Left Ventricle: Left ventricular ejection fraction, by estimation, is 60 to 65%. The left ventricle has normal function. The left ventricle has no regional wall motion abnormalities. The left ventricular internal cavity size was normal in size. There is  mild  left ventricular hypertrophy. Left ventricular diastolic parameters are consistent with Grade I diastolic dysfunction (impaired relaxation). Right Ventricle: The right ventricular size is normal. No increase in right ventricular wall thickness. Right ventricular systolic function is normal. There is normal pulmonary artery systolic pressure. The tricuspid regurgitant velocity is 2.14 m/s, and  with an assumed right atrial pressure of 10 mmHg, the estimated right ventricular systolic pressure is 63.7 mmHg. Left Atrium: Left atrial size was normal in size. Right Atrium: Right atrial size was normal in size. Pericardium: There is no evidence of pericardial effusion. Mitral Valve: The mitral valve is normal in structure. No evidence of mitral valve regurgitation. No evidence of mitral valve stenosis. Tricuspid Valve: The tricuspid valve is normal in structure. Tricuspid valve regurgitation is not demonstrated. No evidence of tricuspid stenosis. Aortic Valve: The aortic valve has an indeterminant number of cusps. Aortic valve regurgitation is not visualized. No aortic stenosis is present. Aortic valve mean gradient measures 3.5 mmHg. Aortic valve peak gradient measures 8.8 mmHg. Aortic valve area, by VTI measures 1.99 cm. Pulmonic Valve: The pulmonic valve was not well visualized. Pulmonic valve regurgitation is not visualized. No evidence of pulmonic stenosis. Aorta: The aortic root is normal in size and structure. Venous: The inferior vena cava is normal in size with greater than 50% respiratory variability, suggesting right atrial pressure of 3 mmHg. IAS/Shunts: No atrial level shunt detected by color flow Doppler.  LEFT VENTRICLE PLAX 2D LVIDd:         3.99 cm  Diastology LVIDs:         1.83 cm  LV e' lateral:   7.72 cm/s LV PW:         1.10 cm  LV E/e' lateral: 8.6 LV IVS:        1.06 cm  LV e' medial:    5.00 cm/s LVOT diam:     1.90 cm  LV E/e' medial:  13.3 LV SV:         52 LV SV Index:   28 LVOT Area:     2.84  cm  RIGHT VENTRICLE RV S prime:     16.30 cm/s TAPSE (M-mode): 2.7 cm LEFT ATRIUM             Index       RIGHT ATRIUM           Index LA diam:        3.90 cm 2.11 cm/m  RA Area:     11.10 cm LA Vol (A2C):   40.3 ml 21.81  ml/m RA Volume:   24.80 ml  13.42 ml/m LA Vol (A4C):   29.5 ml 15.97 ml/m LA Biplane Vol: 35.9 ml 19.43 ml/m  AORTIC VALVE AV Area (Vmax):    1.95 cm AV Area (Vmean):   2.26 cm AV Area (VTI):     1.99 cm AV Vmax:           148.55 cm/s AV Vmean:          83.402 cm/s AV VTI:            0.264 m AV Peak Grad:      8.8 mmHg AV Mean Grad:      3.5 mmHg LVOT Vmax:         102.40 cm/s LVOT Vmean:        66.550 cm/s LVOT VTI:          0.185 m LVOT/AV VTI ratio: 0.70  AORTA Ao Root diam: 2.70 cm MITRAL VALVE               TRICUSPID VALVE MV Area (PHT): 3.01 cm    TR Peak grad:   18.3 mmHg MV Decel Time: 252 msec    TR Vmax:        214.00 cm/s MV E velocity: 66.40 cm/s MV A velocity: 75.80 cm/s  SHUNTS MV E/A ratio:  0.88        Systemic VTI:  0.19 m                            Systemic Diam: 1.90 cm Carlyle Dolly MD Electronically signed by Carlyle Dolly MD Signature Date/Time: 06/09/2020/4:03:57 PM    Final      ASSESSMENT:  1.  Stage II (T2N0) left breast TNBC: -Felt left breast mass for 5 months. -Mammogram on 05/07/2020 with suspicious mass in the 10:30 o'clock position in the left breast.  Suspicious calcifications in the anterior lower inner quadrant of the left breast. -MRI on 06/05/2020 shows left breast mass measuring 3.8 x 3.3 x 3.2 cm in the upper inner quadrant.  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  No suspicious lymphadenopathy.  No MRI evidence of malignancy on the right. -Left breast upper inner quadrant biopsy on 05/14/2020 showed DCIS with calcifications.  Right breast biopsy shows fibroadenomatoid nodule with calcifications.  ER/PR was negative. -Left breast core needle biopsy at 10:30 position shows invasive ductal carcinoma.   ER/PR/HER-2 was negative.  Ki-67 85%.  Left lymph node biopsy was reactive. -Patient was evaluated by Dr. [name] and Dr. Georgette Dover. -She received first cycle of chemotherapy with Adriamycin and cyclophosphamide with pembrolizumab (keynote-522 trial) added for improved PCR by about 15%. -Cycle 1 of Adriamycin, cyclophosphamide and pembrolizumab on 06/16/2020.  2.  Family history: -Paternal cousin and second cousin had breast cancer.  Paternal grandmother also had some type of cancer. -Another paternal cousin died of pancreatic cancer.  Another paternal cousin had leukemia.   PLAN:  1.  Triple negative left breast cancer: -She has tolerated her first cycle very well.  She lost her hair. -She had some loose stool secondary to her diverticulosis. -Labs today showed normal LFTs and white count.  Platelets are also normal. -We will proceed with her cycle 2 today.  Reevaluate in 3 weeks.  2.  Family history: -Based on her personal and family history, genetic testing referral will be made.   Orders placed this encounter:  No orders of the defined types  were placed in this encounter.    Derek Jack, MD [place] [phone]   I, Milinda Antis, am acting as a scribe for Dr. Sanda Linger.  I, Derek Jack MD, have reviewed the above documentation for accuracy and completeness, and I agree with the above.

## 2020-07-07 NOTE — Patient Instructions (Signed)
[facility] at [facility] Discharge Instructions  Labs drawn from portacath today   Thank you for choosing [facility] at [facility] to provide your oncology and hematology care.  To afford each patient quality time with our provider, please arrive at least 15 minutes before your scheduled appointment time.   If you have a lab appointment with the Cancer Center please come in thru the Main Entrance and check in at the main information desk.  You need to re-schedule your appointment should you arrive 10 or more minutes late.  We strive to give you quality time with our providers, and arriving late affects you and other patients whose appointments are after yours.  Also, if you no show three or more times for appointments you may be dismissed from the clinic at the providers discretion.     Again, thank you for choosing [facility].  Our hope is that these requests will decrease the amount of time that you wait before being seen by our physicians.       _____________________________________________________________  Should you have questions after your visit to [facility], please contact our office at [phone] and follow the prompts.  Our office hours are 8:00 a.m. and 4:30 p.m. Monday - Friday.  Please note that voicemails left after 4:00 p.m. may not be returned until the following business day.  We are closed weekends and major holidays.  You do have access to a nurse 24-7, just call the main number to the clinic [phone] and do not press any options, hold on the line and a nurse will answer the phone.    For prescription refill requests, have your pharmacy contact our office and allow 72 hours.    Due to Covid, you will need to wear a mask upon entering the hospital. If you do not have a mask, a mask will be given to you at the Main Entrance upon arrival. For doctor visits, patients may have 1 support person age 18  or older with them. For treatment visits, patients can not have anyone with them due to social distancing guidelines and our immunocompromised population.     

## 2020-07-07 NOTE — Progress Notes (Signed)
Patient was assessed by Dr. Katragadda and labs have been reviewed.  Patient is okay to proceed with treatment today. Primary RN and pharmacy aware.   

## 2020-07-07 NOTE — Patient Instructions (Signed)
[place] at [facility] Discharge Instructions  You were seen today by Dr. Delton Coombes. He went over your recent results. You received your treatment today. Be vigilant for symptoms of watery diarrhea with more than 5 watery bowel movements daily or worsening cough, please call the office immediately. Dr. Delton Coombes will see you back in 3 weeks for labs and follow up.   Thank you for choosing [place] at [facility] to provide your oncology and hematology care.  To afford each patient quality time with our provider, please arrive at least 15 minutes before your scheduled appointment time.   If you have a lab appointment with the [facility] please come in thru the Main Entrance and check in at the main information desk  You need to re-schedule your appointment should you arrive 10 or more minutes late.  We strive to give you quality time with our providers, and arriving late affects you and other patients whose appointments are after yours.  Also, if you no show three or more times for appointments you may be dismissed from the clinic at the providers discretion.     Again, thank you for choosing [facility].  Our hope is that these requests will decrease the amount of time that you wait before being seen by our physicians.       _____________________________________________________________  Should you have questions after your visit to [facility], please contact our office at [phone] between the hours of 8:00 a.m. and 4:30 p.m.  Voicemails left after 4:00 p.m. will not be returned until the following business day.  For prescription refill requests, have your pharmacy contact our office and allow 72 hours.    Cancer Center Support Programs:   > Cancer Support Group  2nd Tuesday of the month 1pm-2pm, Journey Room

## 2020-07-07 NOTE — Patient Instructions (Signed)
[facility] Discharge Instructions for Patients Receiving Chemotherapy  Today you received the following chemotherapy agents   To help prevent nausea and vomiting after your treatment, we encourage you to take your nausea medication   If you develop nausea and vomiting that is not controlled by your nausea medication, call the clinic.   BELOW ARE SYMPTOMS THAT SHOULD BE REPORTED IMMEDIATELY:  *FEVER GREATER THAN 100.5 F  *CHILLS WITH OR WITHOUT FEVER  NAUSEA AND VOMITING THAT IS NOT CONTROLLED WITH YOUR NAUSEA MEDICATION  *UNUSUAL SHORTNESS OF BREATH  *UNUSUAL BRUISING OR BLEEDING  TENDERNESS IN MOUTH AND THROAT WITH OR WITHOUT PRESENCE OF ULCERS  *URINARY PROBLEMS  *BOWEL PROBLEMS  UNUSUAL RASH Items with * indicate a potential emergency and should be followed up as soon as possible.  Feel free to call the clinic should you have any questions or concerns. The clinic phone number is [phone].  Please show the CHEMO ALERT CARD at check-in to the Emergency Department and triage nurse.   

## 2020-07-07 NOTE — Progress Notes (Signed)
Message received from [place] RN/ Dr. Delton Coombes to proceed with treatment today.   Treatment given today per MD orders. Tolerated infusion without adverse affects. Vital signs stable. No complaints at this time. Discharged from clinic ambulatory in stable condition. Alert and oriented x 3. F/U with [facility] as scheduled.

## 2020-07-08 ENCOUNTER — Ambulatory Visit: Payer: BC Managed Care – PPO

## 2020-07-08 ENCOUNTER — Other Ambulatory Visit: Payer: BC Managed Care – PPO

## 2020-07-08 ENCOUNTER — Ambulatory Visit: Payer: BC Managed Care – PPO | Admitting: Hematology and Oncology

## 2020-07-09 ENCOUNTER — Inpatient Hospital Stay (HOSPITAL_COMMUNITY): Payer: BC Managed Care – PPO

## 2020-07-09 ENCOUNTER — Other Ambulatory Visit: Payer: Self-pay

## 2020-07-09 VITALS — BP 141/75 | HR 75 | Temp 98.3°F | Resp 18

## 2020-07-09 DIAGNOSIS — C50812 Malignant neoplasm of overlapping sites of left female breast: Secondary | ICD-10-CM

## 2020-07-09 DIAGNOSIS — Z171 Estrogen receptor negative status [ER-]: Secondary | ICD-10-CM

## 2020-07-09 DIAGNOSIS — Z5112 Encounter for antineoplastic immunotherapy: Secondary | ICD-10-CM | POA: Diagnosis not present

## 2020-07-09 MED ORDER — PEGFILGRASTIM-BMEZ 6 MG/0.6ML ~~LOC~~ SOSY
6.0000 mg | PREFILLED_SYRINGE | Freq: Once | SUBCUTANEOUS | Status: AC
  Administered 2020-07-09: 6 mg via SUBCUTANEOUS
  Filled 2020-07-09: qty 0.6

## 2020-07-09 NOTE — Progress Notes (Signed)
.  Jo Mills presents today for injection per the provider's orders.  Ziextenzo 6mg  administrated without incident; injection site WNL; see MAR for injection details.  Patient tolerated procedure well and without incident.  No questions or complaints noted at this time.

## 2020-07-10 ENCOUNTER — Ambulatory Visit: Payer: BC Managed Care – PPO

## 2020-07-19 ENCOUNTER — Telehealth: Payer: Self-pay | Admitting: *Deleted

## 2020-07-19 NOTE — Telephone Encounter (Signed)
PATIENT STATED THAT SHE DOESN'T WANT TO HAVE ANY RT UNTIL HER CHEMO IS FINISHED.

## 2020-07-28 ENCOUNTER — Inpatient Hospital Stay (HOSPITAL_COMMUNITY): Payer: BC Managed Care – PPO

## 2020-07-28 ENCOUNTER — Other Ambulatory Visit: Payer: Self-pay

## 2020-07-28 ENCOUNTER — Inpatient Hospital Stay (HOSPITAL_COMMUNITY): Payer: BC Managed Care – PPO | Attending: Hematology

## 2020-07-28 ENCOUNTER — Inpatient Hospital Stay (HOSPITAL_COMMUNITY): Payer: BC Managed Care – PPO | Admitting: Hematology

## 2020-07-28 ENCOUNTER — Other Ambulatory Visit (HOSPITAL_COMMUNITY): Payer: Self-pay

## 2020-07-28 VITALS — BP 107/60 | HR 57 | Temp 97.5°F | Resp 18

## 2020-07-28 VITALS — BP 122/65 | HR 65 | Temp 97.1°F | Resp 18 | Wt 159.4 lb

## 2020-07-28 DIAGNOSIS — Z171 Estrogen receptor negative status [ER-]: Secondary | ICD-10-CM

## 2020-07-28 DIAGNOSIS — Z803 Family history of malignant neoplasm of breast: Secondary | ICD-10-CM | POA: Insufficient documentation

## 2020-07-28 DIAGNOSIS — Z5111 Encounter for antineoplastic chemotherapy: Secondary | ICD-10-CM | POA: Diagnosis present

## 2020-07-28 DIAGNOSIS — C50812 Malignant neoplasm of overlapping sites of left female breast: Secondary | ICD-10-CM | POA: Diagnosis not present

## 2020-07-28 DIAGNOSIS — Z5189 Encounter for other specified aftercare: Secondary | ICD-10-CM | POA: Insufficient documentation

## 2020-07-28 DIAGNOSIS — Z809 Family history of malignant neoplasm, unspecified: Secondary | ICD-10-CM | POA: Diagnosis not present

## 2020-07-28 DIAGNOSIS — L853 Xerosis cutis: Secondary | ICD-10-CM | POA: Insufficient documentation

## 2020-07-28 DIAGNOSIS — R110 Nausea: Secondary | ICD-10-CM | POA: Insufficient documentation

## 2020-07-28 DIAGNOSIS — Z8249 Family history of ischemic heart disease and other diseases of the circulatory system: Secondary | ICD-10-CM | POA: Insufficient documentation

## 2020-07-28 DIAGNOSIS — Z79899 Other long term (current) drug therapy: Secondary | ICD-10-CM | POA: Diagnosis not present

## 2020-07-28 DIAGNOSIS — R531 Weakness: Secondary | ICD-10-CM | POA: Insufficient documentation

## 2020-07-28 DIAGNOSIS — R5383 Other fatigue: Secondary | ICD-10-CM | POA: Diagnosis not present

## 2020-07-28 DIAGNOSIS — Z5112 Encounter for antineoplastic immunotherapy: Secondary | ICD-10-CM | POA: Diagnosis not present

## 2020-07-28 DIAGNOSIS — Z8379 Family history of other diseases of the digestive system: Secondary | ICD-10-CM | POA: Insufficient documentation

## 2020-07-28 LAB — CBC WITH DIFFERENTIAL/PLATELET
Abs Immature Granulocytes: 0.02 10*3/uL (ref 0.00–0.07)
Basophils Absolute: 0.1 10*3/uL (ref 0.0–0.1)
Basophils Relative: 2 %
Eosinophils Absolute: 0 10*3/uL (ref 0.0–0.5)
Eosinophils Relative: 1 %
HCT: 34.6 % — ABNORMAL LOW (ref 36.0–46.0)
Hemoglobin: 11.5 g/dL — ABNORMAL LOW (ref 12.0–15.0)
Immature Granulocytes: 0 %
Lymphocytes Relative: 25 %
Lymphs Abs: 1.4 10*3/uL (ref 0.7–4.0)
MCH: 31.5 pg (ref 26.0–34.0)
MCHC: 33.2 g/dL (ref 30.0–36.0)
MCV: 94.8 fL (ref 80.0–100.0)
Monocytes Absolute: 0.8 10*3/uL (ref 0.1–1.0)
Monocytes Relative: 15 %
Neutro Abs: 3.2 10*3/uL (ref 1.7–7.7)
Neutrophils Relative %: 57 %
Platelets: 358 10*3/uL (ref 150–400)
RBC: 3.65 MIL/uL — ABNORMAL LOW (ref 3.87–5.11)
RDW: 15.4 % (ref 11.5–15.5)
WBC: 5.6 10*3/uL (ref 4.0–10.5)
nRBC: 0 % (ref 0.0–0.2)

## 2020-07-28 LAB — COMPREHENSIVE METABOLIC PANEL
ALT: 20 U/L (ref 0–44)
AST: 19 U/L (ref 15–41)
Albumin: 3.6 g/dL (ref 3.5–5.0)
Alkaline Phosphatase: 77 U/L (ref 38–126)
Anion gap: 7 (ref 5–15)
BUN: 11 mg/dL (ref 8–23)
CO2: 26 mmol/L (ref 22–32)
Calcium: 9.2 mg/dL (ref 8.9–10.3)
Chloride: 106 mmol/L (ref 98–111)
Creatinine, Ser: 0.51 mg/dL (ref 0.44–1.00)
GFR, Estimated: >60 mL/min (ref >60)
Glucose, Bld: 79 mg/dL (ref 70–99)
Potassium: 3.6 mmol/L (ref 3.5–5.1)
Sodium: 139 mmol/L (ref 135–145)
Total Bilirubin: 0.5 mg/dL (ref 0.3–1.2)
Total Protein: 6.1 g/dL — ABNORMAL LOW (ref 6.5–8.1)

## 2020-07-28 LAB — TSH: TSH: 0.078 u[IU]/mL — ABNORMAL LOW (ref 0.350–4.500)

## 2020-07-28 MED ORDER — SODIUM CHLORIDE 0.9 % IV SOLN
600.0000 mg/m2 | Freq: Once | INTRAVENOUS | Status: AC
  Administered 2020-07-28: 1120 mg via INTRAVENOUS
  Filled 2020-07-28: qty 56

## 2020-07-28 MED ORDER — SODIUM CHLORIDE 0.9% FLUSH
10.0000 mL | INTRAVENOUS | Status: DC | PRN
  Administered 2020-07-28: 10 mL

## 2020-07-28 MED ORDER — SODIUM CHLORIDE 0.9 % IV SOLN
10.0000 mg | Freq: Once | INTRAVENOUS | Status: AC
  Administered 2020-07-28: 10 mg via INTRAVENOUS
  Filled 2020-07-28: qty 10

## 2020-07-28 MED ORDER — SODIUM CHLORIDE 0.9 % IV SOLN
200.0000 mg | Freq: Once | INTRAVENOUS | Status: AC
  Administered 2020-07-28: 200 mg via INTRAVENOUS
  Filled 2020-07-28: qty 8

## 2020-07-28 MED ORDER — HEPARIN SOD (PORK) LOCK FLUSH 100 UNIT/ML IV SOLN
500.0000 [IU] | Freq: Once | INTRAVENOUS | Status: AC | PRN
  Administered 2020-07-28: 500 [IU]

## 2020-07-28 MED ORDER — SODIUM CHLORIDE 0.9 % IV SOLN: Freq: Once | INTRAVENOUS | Status: AC

## 2020-07-28 MED ORDER — SODIUM CHLORIDE 0.9 % IV SOLN
150.0000 mg | Freq: Once | INTRAVENOUS | Status: AC
  Administered 2020-07-28: 150 mg via INTRAVENOUS
  Filled 2020-07-28: qty 150

## 2020-07-28 MED ORDER — DOXORUBICIN HCL CHEMO IV INJECTION 2 MG/ML
60.0000 mg/m2 | Freq: Once | INTRAVENOUS | Status: AC
  Administered 2020-07-28: 112 mg via INTRAVENOUS
  Filled 2020-07-28: qty 56

## 2020-07-28 MED ORDER — PALONOSETRON HCL INJECTION 0.25 MG/5ML
0.2500 mg | Freq: Once | INTRAVENOUS | Status: AC
  Administered 2020-07-28: 0.25 mg via INTRAVENOUS
  Filled 2020-07-28: qty 5

## 2020-07-28 NOTE — Progress Notes (Signed)
[name],  [id number]   CLINIC:  Medical Oncology/Hematology  PCP:  Roderic Scarce, MD [address] 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 Mammogram and US showed a 2.7cm mass at the 10:30 position, one left axillary lymph node with cortical thickening, calcifications in the lower inner left breast, and a 0.4cm group of calcifications in the right breast. Biopsy on 05/14/20 showed no malignancy in the right breast, and in the left breast, DCIS, high grade, ER/PR negative. 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    06/16/2020 -  Chemotherapy   The patient had dexamethasone (DECADRON) 4 MG tablet, 1 of 1 cycle, Start date: 06/08/2020, End date: -- DOXOrubicin (ADRIAMYCIN) chemo injection 112 mg, 60 mg/m2 = 112 mg, Intravenous,  Once, 2 of 4 cycles Administration: 112 mg (06/16/2020), 112 mg (07/07/2020) palonosetron (ALOXI) injection 0.25 mg, 0.25 mg, Intravenous,  Once, 2 of 8 cycles Administration: 0.25 mg (06/16/2020), 0.25 mg (07/07/2020) pegfilgrastim-bmez (ZIEXTENZO) injection 6 mg, 6 mg, Subcutaneous,  Once, 2 of 4 cycles Administration: 6 mg (06/19/2020), 6 mg (07/09/2020) CARBOplatin  (PARAPLATIN) in sodium chloride 0.9 % 100 mL chemo infusion, , Intravenous,  Once, 0 of 4 cycles cyclophosphamide (CYTOXAN) 1,120 mg in sodium chloride 0.9 % 250 mL chemo infusion, 600 mg/m2 = 1,120 mg, Intravenous,  Once, 2 of 4 cycles Administration: 1,120 mg (06/16/2020), 1,120 mg (07/07/2020) PACLitaxel (TAXOL) 150 mg in sodium chloride 0.9 % 250 mL chemo infusion (</= $RemoveBefor'80mg'yKoimMEMjXIy$ /m2), 80 mg/m2, Intravenous,  Once, 0 of 4 cycles fosaprepitant (EMEND) 150 mg in sodium chloride 0.9 % 145 mL IVPB, 150 mg, Intravenous,  Once, 2 of 8 cycles Administration: 150 mg (06/16/2020), 150 mg (07/07/2020) pembrolizumab (KEYTRUDA) 200 mg in sodium chloride 0.9 % 50 mL chemo infusion, 150 mg, Intravenous, Once, 2 of 8 cycles Dose modification: 200 mg (original dose 2 mg/kg, Cycle 2, Reason: Provider Judgment, Comment: flat dose per manufacturer) Administration: 200 mg (06/16/2020), 200 mg (07/07/2020)  for chemotherapy treatment.      CANCER STAGING: Cancer Staging Malignant neoplasm of overlapping sites of left breast in female, estrogen receptor negative ([place]) Staging form: Breast, AJCC 8th Edition - Clinical stage from 05/17/2020: Stage IIB (cT2, cN0, cM0, G3, ER-, PR-, HER2-) - Signed by Gardenia Phlegm, NP on 05/26/2020   INTERVAL HISTORY:  [name], a 69 y.o. female, returns for routine follow-up and consideration for next cycle of chemotherapy. Janvi was last seen on 07/07/2020.  Due for cycle #3 of AC and pembrolizumab today.   Today she is accompanied by her husband. Overall, she tells me she has been feeling pretty well. She reports having some nausea after the previous treatment, but denies V/D. She also reports  having some weakness in both legs, though she jogs and takes the stairs whenever she can. Her appetite is good but denies having mouth sores. She has familial dry skin.  She got Moderna COVID vaccine on 3/5 and is inquiring about the booster and flu shot.  Overall, she feels ready  for next cycle of chemo today.    REVIEW OF SYSTEMS:  Review of Systems  Constitutional: Positive for appetite change (90%) and fatigue (90%).  HENT:   Negative for mouth sores.   Gastrointestinal: Positive for nausea (after chemo). Negative for diarrhea and vomiting.  Neurological: Positive for extremity weakness (legs).    PAST MEDICAL/SURGICAL HISTORY:  Past Medical History:  Diagnosis Date  . Allergy   . Breast cancer ([place])   . Cancer ([place])   . Diverticulitis   . GERD (gastroesophageal reflux disease)   . Hypertension   . Skin cancer    Past Surgical History:  Procedure Laterality Date  . COLONOSCOPY    . PORTACATH PLACEMENT N/A 06/15/2020   Procedure: INSERTION PORT-A-CATH WITH ULTRASOUND GUIDANCE;  Surgeon: Donnie Mesa, MD;  Location: [facility];  Service: General;  Laterality: Right;  LMA, LEAVE PORT ACCESSED-CHEMO START 9/1    SOCIAL HISTORY:  Social History   Socioeconomic History  . Marital status: Married    Spouse name: Not on file  . Number of children: Not on file  . Years of education: Not on file  . Highest education level: Not on file  Occupational History  . Not on file  Tobacco Use  . Smoking status: Never Smoker  . Smokeless tobacco: Never Used  Substance and Sexual Activity  . Alcohol use: Not Currently  . Drug use: Never  . Sexual activity: Not on file  Other Topics Concern  . Not on file  Social History Narrative  . Not on file   Social Determinants of Health   Financial Resource Strain: Low Risk   . Difficulty of Paying Living Expenses: Not hard at all  Food Insecurity: No Food Insecurity  . Worried About Charity fundraiser in the Last Year: Never true  . Ran Out of Food in the Last Year: Never true  Transportation Needs: No Transportation Needs  . Lack of Transportation (Medical): No  . Lack of Transportation (Non-Medical): No  Physical Activity: Sufficiently Active  . Days of Exercise per Week: 7 days  .  Minutes of Exercise per Session: 60 min  Stress: Stress Concern Present  . Feeling of Stress : To some extent  Social Connections: Moderately Integrated  . Frequency of Communication with Friends and Family: Once a week  . Frequency of Social Gatherings with Friends and Family: Never  . Attends Religious Services: More than 4 times per year  . Active Member of Clubs or Organizations: Yes  . Attends Archivist Meetings: 1 to 4 times per year  . Marital Status: Married  Human resources officer Violence: Not At Risk  . Fear of Current or Ex-Partner: No  . Emotionally Abused: No  . Physically Abused: No  . Sexually Abused: No    FAMILY HISTORY:  Family History  Problem Relation Age of Onset  . Diverticulitis Mother   . Hypertension Maternal Aunt   . Hypertension Maternal Uncle   . Cancer Paternal Grandmother   . Breast cancer Cousin     CURRENT MEDICATIONS:  Current Outpatient Medications  Medication Sig Dispense Refill  . amLODipine-benazepril (LOTREL) 5-20 MG capsule Take 1 capsule by mouth  daily.    . Bismuth Subsalicylate (PEPTO-BISMOL PO) Take by mouth as needed.    Marland Kitchen dexamethasone (DECADRON) 4 MG tablet Take 1 tablet day after chemo and 1 tablet 2 days after chemo with food 16 tablet 1  . fexofenadine (ALLEGRA) 180 MG tablet Take 180 mg by mouth daily.    . Lactobacillus-Inulin (CULTURELLE DIGESTIVE DAILY) CAPS Take 1 capsule by mouth daily.    Marland Kitchen LORazepam (ATIVAN) 0.5 MG tablet Take 1 tablet (0.5 mg total) by mouth at bedtime as needed for sleep. 30 tablet 0  . omeprazole (PRILOSEC) 20 MG capsule Take 20 mg by mouth daily.    Marland Kitchen lidocaine-prilocaine (EMLA) cream Apply to affected area once (Patient not taking: Reported on 07/28/2020) 30 g 3  . ondansetron (ZOFRAN) 8 MG tablet Take 1 tablet (8 mg total) by mouth 2 (two) times daily as needed. Start on the third day after chemotherapy. (Patient not taking: Reported on 07/28/2020) 30 tablet 1  . prochlorperazine (COMPAZINE)  10 MG tablet Take 1 tablet (10 mg total) by mouth every 6 (six) hours as needed (Nausea or vomiting). (Patient not taking: Reported on 07/28/2020) 30 tablet 1   No current facility-administered medications for this visit.    ALLERGIES:  Allergies  Allergen Reactions  . Grapefruit Extract Other (See Comments)    Other Reaction: Other reaction  . Pseudoephedrine Hcl Other (See Comments)    Other Reaction: Other reaction  . Nickel   . Pineapple   . Amoxicillin Rash    PHYSICAL EXAM:  Performance status (ECOG): 1 - Symptomatic but completely ambulatory  Vitals:   07/28/20 0836  BP: 122/65  Pulse: 65  Resp: 18  Temp: (!) 97.1 F (36.2 C)  SpO2: 100%   Wt Readings from Last 3 Encounters:  07/28/20 159 lb 6.4 oz (72.3 kg)  07/07/20 161 lb 9.6 oz (73.3 kg)  06/23/20 161 lb 9.6 oz (73.3 kg)   Physical Exam Vitals reviewed.  Constitutional:      Appearance: Normal appearance.  Cardiovascular:     Rate and Rhythm: Normal rate and regular rhythm.     Pulses: Normal pulses.     Heart sounds: Normal heart sounds.  Pulmonary:     Effort: Pulmonary effort is normal.     Breath sounds: Normal breath sounds.  Chest:     Comments: Port-a-Cath in R chest Musculoskeletal:     Right lower leg: Edema (trace) present.     Left lower leg: Edema (trace) present.  Neurological:     General: No focal deficit present.     Mental Status: She is alert and oriented to person, place, and time.  Psychiatric:        Mood and Affect: Mood normal.        Behavior: Behavior normal.     LABORATORY DATA:  I have reviewed the labs as listed.  CBC Latest Ref Rng & Units 07/28/2020 07/07/2020 06/23/2020  WBC 4.0 - 10.5 K/uL 5.6 6.6 1.5(L)  Hemoglobin 12.0 - 15.0 g/dL 11.5(L) 12.2 13.5  Hematocrit 36 - 46 % 34.6(L) 36.5 41.4  Platelets 150 - 400 K/uL 358 364 145(L)   CMP Latest Ref Rng & Units 07/28/2020 07/07/2020 06/23/2020  Glucose 70 - 99 mg/dL 79 136(H) 94  BUN 8 - 23 mg/dL $Remove'11 14 14   'TgfrJNR$ Creatinine 0.44 - 1.00 mg/dL 0.51 0.69 0.70  Sodium 135 - 145 mmol/L 139 138 138  Potassium 3.5 - 5.1 mmol/L 3.6 3.7 3.9  Chloride 98 -  111 mmol/L 106 105 101  CO2 22 - 32 mmol/L $RemoveB'26 24 27  'iYGlkalS$ Calcium 8.9 - 10.3 mg/dL 9.2 9.2 9.2  Total Protein 6.5 - 8.1 g/dL 6.1(L) 6.3(L) 6.6  Total Bilirubin 0.3 - 1.2 mg/dL 0.5 0.6 1.0  Alkaline Phos 38 - 126 U/L 77 78 85  AST 15 - 41 U/L 19 21 14(L)  ALT 0 - 44 U/L $Remo'20 22 24    'LBnGa$ DIAGNOSTIC IMAGING:  I have independently reviewed the scans and discussed with the patient. No results found.   ASSESSMENT:  1. Stage II (T2N0) left breast TNBC: -Felt left breast mass for 5 months. -Mammogram on 05/07/2020 with suspicious mass in the 10:30 o'clock position in the left breast. Suspicious calcifications in the anterior lower inner quadrant of the left breast. -MRI on 06/05/2020 shows left breast mass measuring 3.8 x 3.3 x 3.2 cm in the upper inner quadrant. 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 No suspicious lymphadenopathy. No MRI evidence of malignancy on the right. -Left breast upper inner quadrant biopsy on 05/14/2020 showed DCIS with calcifications. Right breast biopsy shows fibroadenomatoid nodule with calcifications. ER/PR was negative. -Left breast core needle biopsy at 10:30 position shows invasive ductal carcinoma. ER/PR/HER-2 was negative. Ki-67 85%. Left lymph node biopsy was reactive. -Patient was evaluated by Dr. [name] and Dr. Georgette Dover. -She received first cycle of chemotherapy with Adriamycin and cyclophosphamide with pembrolizumab ([id number] trial) added for improved PCR by about 15%. -Cycle 1 of Adriamycin, cyclophosphamide and pembrolizumab on 06/16/2020.  2.  Family history: -Paternal cousin and second cousin had breast cancer.  Paternal grandmother also had some type of cancer. -Another paternal cousin died of pancreatic cancer.  Another paternal cousin had leukemia.   PLAN:  1. Triple negative left  breast cancer: -She has tolerated cycle 2 very well.  Denied any immunotherapy related side effects. -Had occasional nausea which was well controlled with nausea medication. -Reviewed labs which showed normal LFTs and bilirubin.  CBC was also normal. -TSH was 0.078.  Previously 0.116 on 06/16/2020.  We will closely monitor at next visit. -We will also check T3 and T4. -She will proceed with cycle 3 today.  RTC in 3 weeks.  2. Family history: -She will be referred for genetic testing.   Orders placed this encounter:  No orders of the defined types were placed in this encounter.    Derek Jack, MD [place] [phone]   I, Milinda Antis, am acting as a scribe for Dr. Sanda Linger.  I, Derek Jack MD, have reviewed the above documentation for accuracy and completeness, and I agree with the above.

## 2020-07-28 NOTE — Progress Notes (Signed)
Labs reviewed today with MD . Proceed as planned.   Treatment given per orders. Patient tolerated it well without problems. Vitals stable and discharged home from clinic ambulatory in stable condition. Follow up as scheduled.  

## 2020-07-28 NOTE — Progress Notes (Signed)
Patient was assessed by Dr. Katragadda and labs have been reviewed.  Patient is okay to proceed with treatment today. Primary RN and pharmacy aware.   

## 2020-07-28 NOTE — Patient Instructions (Signed)
[place] at [facility] Discharge Instructions  You were seen today by Dr. Delton Coombes. He went over your recent results. You received your treatment today. You can proceed with getting your flu shot and COVID booster the week before returning for chemo. Dr. Delton Coombes will see you back in 3 weeks for labs and follow up.   Thank you for choosing [place] at [facility] to provide your oncology and hematology care.  To afford each patient quality time with our provider, please arrive at least 15 minutes before your scheduled appointment time.   If you have a lab appointment with the [facility] please come in thru the Main Entrance and check in at the main information desk  You need to re-schedule your appointment should you arrive 10 or more minutes late.  We strive to give you quality time with our providers, and arriving late affects you and other patients whose appointments are after yours.  Also, if you no show three or more times for appointments you may be dismissed from the clinic at the providers discretion.     Again, thank you for choosing [facility].  Our hope is that these requests will decrease the amount of time that you wait before being seen by our physicians.       _____________________________________________________________  Should you have questions after your visit to [facility], please contact our office at [phone] between the hours of 8:00 a.m. and 4:30 p.m.  Voicemails left after 4:00 p.m. will not be returned until the following business day.  For prescription refill requests, have your pharmacy contact our office and allow 72 hours.    Cancer Center Support Programs:   > Cancer Support Group  2nd Tuesday of the month 1pm-2pm, Journey Room

## 2020-07-28 NOTE — Patient Instructions (Signed)
[facility] Discharge Instructions for Patients Receiving Chemotherapy  Today you received the following chemotherapy agents   To help prevent nausea and vomiting after your treatment, we encourage you to take your nausea medication   If you develop nausea and vomiting that is not controlled by your nausea medication, call the clinic.   BELOW ARE SYMPTOMS THAT SHOULD BE REPORTED IMMEDIATELY:  *FEVER GREATER THAN 100.5 F  *CHILLS WITH OR WITHOUT FEVER  NAUSEA AND VOMITING THAT IS NOT CONTROLLED WITH YOUR NAUSEA MEDICATION  *UNUSUAL SHORTNESS OF BREATH  *UNUSUAL BRUISING OR BLEEDING  TENDERNESS IN MOUTH AND THROAT WITH OR WITHOUT PRESENCE OF ULCERS  *URINARY PROBLEMS  *BOWEL PROBLEMS  UNUSUAL RASH Items with * indicate a potential emergency and should be followed up as soon as possible.  Feel free to call the clinic should you have any questions or concerns. The clinic phone number is [phone].  Please show the CHEMO ALERT CARD at check-in to the Emergency Department and triage nurse.   

## 2020-07-29 ENCOUNTER — Other Ambulatory Visit: Payer: BC Managed Care – PPO

## 2020-07-29 ENCOUNTER — Ambulatory Visit: Payer: BC Managed Care – PPO | Admitting: Hematology and Oncology

## 2020-07-29 ENCOUNTER — Encounter (HOSPITAL_COMMUNITY): Payer: Self-pay

## 2020-07-29 ENCOUNTER — Inpatient Hospital Stay (HOSPITAL_COMMUNITY): Payer: BC Managed Care – PPO

## 2020-07-29 ENCOUNTER — Ambulatory Visit: Payer: BC Managed Care – PPO

## 2020-07-29 VITALS — BP 117/56 | HR 66 | Temp 97.5°F | Resp 18

## 2020-07-29 DIAGNOSIS — C50812 Malignant neoplasm of overlapping sites of left female breast: Secondary | ICD-10-CM

## 2020-07-29 DIAGNOSIS — Z5112 Encounter for antineoplastic immunotherapy: Secondary | ICD-10-CM | POA: Diagnosis not present

## 2020-07-29 DIAGNOSIS — Z171 Estrogen receptor negative status [ER-]: Secondary | ICD-10-CM

## 2020-07-29 MED ORDER — PEGFILGRASTIM-BMEZ 6 MG/0.6ML ~~LOC~~ SOSY
6.0000 mg | PREFILLED_SYRINGE | Freq: Once | SUBCUTANEOUS | Status: AC
  Administered 2020-07-29: 6 mg via SUBCUTANEOUS
  Filled 2020-07-29: qty 0.6

## 2020-07-29 NOTE — Progress Notes (Signed)
Jo Mills presents today for injection per the provider's orders.  Ziextenzio administration without incident; injection site WNL; see MAR for injection details. Patient tolerated procedure well and without incident.  No questions or complaints noted at this time. Discharged ambulatory in stable condition.

## 2020-07-30 ENCOUNTER — Telehealth (HOSPITAL_COMMUNITY): Payer: Self-pay

## 2020-07-30 ENCOUNTER — Ambulatory Visit (HOSPITAL_COMMUNITY): Payer: BC Managed Care – PPO

## 2020-07-30 NOTE — Telephone Encounter (Signed)
Nutrition Assessment:  Patient identified on Malnutrition Screening report for weight loss.  69 year old female with left breast cancer, triple negative.  Past medical history of diverticulitis, GERD, HTN.  Patient receiving chemotherapy.   Spoke with patient via phone to introduce self and service.  Patient reports that overall appetite is somewhat decreased but still eating well.  Prior to treatment was a binge/stress eater but is not doing that now.  Portion sizes are smaller and eating more frequently.  Typically has cornflakes with craisins and walnuts and coffee, or boiled egg or oatmeal with dried fruit and nuts.  Lunch is boost shake and applesauce or 1/2 sandwich and applesauce.  Supper meat (chicken) and green beans and potatoes.    Reports having bout of diverticulitis at the start of treatment.  Diarrhea has resolved.    Patient jogs 2 miles and walks 1 mile per day  Medications: compazine, prilosec, ativan, decadron, lactobaccilus  Labs: reviewed  Anthropometrics:   Height: 65.5 inches Weight: 159 lb 6.4 oz 166 lb in 06/08/20 BMI: 26  4% weight loss in the last 2 months  NUTRITION DIAGNOSIS: Unintentional weight loss related to cancer related treatment side effects and bout of diverticulitis as evidenced by 4% weight loss and decreased intake   INTERVENTION:  Encouraged small frequent meals Encouraged good sources of protein as well as incorporating plant foods in diet as patient is doing.   Continue exercise as able.   Contact information provided.     MONITORING, EVALUATION, GOAL: weight loss, intake   NEXT VISIT: no follow-up planned at this time. Patient to contact RD with questions or concers  Jo Mills B. Zenia Resides, [place], [place] Registered Dietitian [phone] (mobile)

## 2020-07-31 ENCOUNTER — Ambulatory Visit: Payer: BC Managed Care – PPO

## 2020-08-16 ENCOUNTER — Inpatient Hospital Stay: Payer: BC Managed Care – PPO | Attending: Hematology and Oncology | Admitting: Genetic Counselor

## 2020-08-16 DIAGNOSIS — Z803 Family history of malignant neoplasm of breast: Secondary | ICD-10-CM

## 2020-08-16 DIAGNOSIS — Z800 Family history of malignant neoplasm of digestive organs: Secondary | ICD-10-CM

## 2020-08-16 DIAGNOSIS — C50812 Malignant neoplasm of overlapping sites of left female breast: Secondary | ICD-10-CM

## 2020-08-16 DIAGNOSIS — Z171 Estrogen receptor negative status [ER-]: Secondary | ICD-10-CM

## 2020-08-17 ENCOUNTER — Encounter: Payer: Self-pay | Admitting: Genetic Counselor

## 2020-08-17 DIAGNOSIS — Z803 Family history of malignant neoplasm of breast: Secondary | ICD-10-CM | POA: Insufficient documentation

## 2020-08-17 DIAGNOSIS — Z800 Family history of malignant neoplasm of digestive organs: Secondary | ICD-10-CM

## 2020-08-17 HISTORY — DX: Family history of malignant neoplasm of breast: Z80.3

## 2020-08-17 HISTORY — DX: Family history of malignant neoplasm of digestive organs: Z80.0

## 2020-08-17 NOTE — Progress Notes (Signed)
REFERRING PROVIDER: Derek Jack, MD [address] [place],  [place] [address]  PRIMARY PROVIDER:  Roderic Scarce, MD  PRIMARY REASON FOR VISIT:  1. Malignant neoplasm of overlapping sites of left breast in female, estrogen receptor negative ([place])   2. Family history of breast cancer   3. Family history of pancreatic cancer    I connected with Jo Mills on 08/16/2020 at 11am EDT by MyChart video conference and verified that I am speaking with the correct person using two identifiers.   HISTORY OF PRESENT ILLNESS:   Jo Mills, a 69 y.o. female, was seen for a [facility] cancer genetics consultation at the request of Dr. Delton Coombes due to a personal and family history of cancer.  Jo Mills presents to clinic today to discuss the possibility of a hereditary predisposition to cancer, genetic testing, and to further clarify her future cancer risks, as well as potential cancer risks for family members.   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 The preliminary treatment plan includes neoadjuvant chemotherapy, breast conserving surgery, and radiation.  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   CANCER HISTORY:  Oncology History  Malignant neoplasm of overlapping sites of left breast in female, estrogen receptor negative ([place])  05/17/2020 Cancer Staging   Staging form: Breast, AJCC 8th Edition - Clinical stage from 05/17/2020: Stage IIB (cT2, cN0, cM0, G3, ER-, PR-, HER2-) - Signed by Gardenia Phlegm, NP on 05/26/2020   05/26/2020 Initial Diagnosis   Patient palpated a left breast mass for 5 months. Mammogram and US showed a 2.7cm mass at the 10:30 position, one left axillary lymph node with cortical thickening, calcifications in the lower inner left breast, and a 0.4cm group of calcifications in the right breast. Biopsy on 05/14/20  showed no malignancy in the right breast, and in the left breast, DCIS, high grade, ER/PR negative. 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    06/16/2020 -  Chemotherapy   The patient had dexamethasone (DECADRON) 4 MG tablet, 1 of 1 cycle, Start date: 06/08/2020, End date: -- DOXOrubicin (ADRIAMYCIN) chemo injection 112 mg, 60 mg/m2 = 112 mg, Intravenous,  Once, 3 of 4 cycles Administration: 112 mg (06/16/2020), 112 mg (07/07/2020), 112 mg (07/28/2020) palonosetron (ALOXI) injection 0.25 mg, 0.25 mg, Intravenous,  Once, 3 of 8 cycles Administration: 0.25 mg (06/16/2020), 0.25 mg (07/07/2020), 0.25 mg (07/28/2020) pegfilgrastim-bmez (ZIEXTENZO) injection 6 mg, 6 mg, Subcutaneous,  Once, 3 of 4 cycles Administration: 6 mg (06/19/2020), 6 mg (07/09/2020) CARBOplatin (PARAPLATIN) in sodium chloride 0.9 % 100 mL chemo infusion, , Intravenous,  Once, 0 of 4 cycles cyclophosphamide (CYTOXAN) 1,120 mg in sodium chloride 0.9 % 250 mL chemo infusion, 600 mg/m2 = 1,120 mg, Intravenous,  Once, 3 of 4 cycles Administration: 1,120 mg (06/16/2020), 1,120 mg (07/07/2020), 1,120 mg (07/28/2020) PACLitaxel (TAXOL) 150 mg in sodium chloride 0.9 % 250 mL chemo infusion (</= 82m/m2), 80 mg/m2, Intravenous,  Once, 0 of 4 cycles fosaprepitant (EMEND) 150 mg in sodium chloride 0.9 % 145 mL IVPB, 150 mg, Intravenous,  Once, 3 of 8 cycles Administration: 150 mg (06/16/2020), 150 mg (07/07/2020), 150 mg (07/28/2020) pembrolizumab (KEYTRUDA) 200 mg in sodium chloride 0.9 % 50 mL chemo  infusion, 150 mg, Intravenous, Once, 3 of 8 cycles Dose modification: 200 mg (original dose 2 mg/kg, Cycle 2, Reason: Provider Judgment, Comment: flat dose per manufacturer) Administration: 200 mg (06/16/2020), 200 mg (07/07/2020), 200 mg (07/28/2020)  for chemotherapy treatment.     RISK FACTORS:  Menarche was at age 54.   Nulliparous OCP use for approximately 7 years.  Ovaries intact: yes.  Hysterectomy: no.  Menopausal status: postmenopausal.  HRT use: 0 years. Colonoscopy: yes; 10 years ago per patient. Mammogram within the last year: yes. Number of breast biopsies: 1. Up to date with pelvic exams: most recent 6-7 years ago Any excessive radiation exposure in the past: no  Past Medical History:  Diagnosis Date  . Allergy   . Breast cancer ([place])   . Cancer ([place])   . Diverticulitis   . Family history of breast cancer 08/17/2020  . Family history of pancreatic cancer 08/17/2020  . GERD (gastroesophageal reflux disease)   . Hypertension   . Skin cancer     Past Surgical History:  Procedure Laterality Date  . COLONOSCOPY    . PORTACATH PLACEMENT N/A 06/15/2020   Procedure: INSERTION PORT-A-CATH WITH ULTRASOUND GUIDANCE;  Surgeon: Donnie Mesa, MD;  Location: [facility];  Service: General;  Laterality: Right;  LMA, LEAVE PORT ACCESSED-CHEMO START 9/1    Social History   Socioeconomic History  . Marital status: Married    Spouse name: Not on file  . Number of children: Not on file  . Years of education: Not on file  . Highest education level: Not on file  Occupational History  . Not on file  Tobacco Use  . Smoking status: Never Smoker  . Smokeless tobacco: Never Used  Substance and Sexual Activity  . Alcohol use: Not Currently  . Drug use: Never  . Sexual activity: Not on file  Other Topics Concern  . Not on file  Social History Narrative  . Not on file   Social Determinants of Health   Financial Resource Strain: Low Risk   . Difficulty of Paying Living Expenses: Not hard at all  Food Insecurity: No Food Insecurity  . Worried About Charity fundraiser in the Last Year: Never true  . Ran Out of Food in the Last Year: Never true  Transportation Needs: No Transportation Needs  . Lack of Transportation (Medical): No  . Lack of Transportation (Non-Medical): No   Physical Activity: Sufficiently Active  . Days of Exercise per Week: 7 days  . Minutes of Exercise per Session: 60 min  Stress: Stress Concern Present  . Feeling of Stress : To some extent  Social Connections: Moderately Integrated  . Frequency of Communication with Friends and Family: Once a week  . Frequency of Social Gatherings with Friends and Family: Never  . Attends Religious Services: More than 4 times per year  . Active Member of Clubs or Organizations: Yes  . Attends Archivist Meetings: 1 to 4 times per year  . Marital Status: Married     FAMILY HISTORY:  We obtained a detailed, 4-generation family history.  Significant diagnoses are listed below: Family History  Problem Relation Age of Onset  . Cancer Paternal Grandmother        unknown type; d. 53s  . Breast cancer Paternal Cousin 36  . Pancreatic cancer Paternal Cousin        dx 69s    Jo Mills's mother passed away at age 71 and did not have cancer.  No maternal family history of cancer was reported.  Jo Mills father passed away at age 44 and did not have cancer.  Jo Mills has a paternal cousin diagnosed with breast cancer at age 54 and a paternal cousin diagnosed with pancreatic cancer in his 50s.  Jo Mills paternal grandmother had an unknown type of cancer and passed away in her 46s.  No other paternal family history of cancer was reported.   Jo Mills is unaware of previous family history of genetic testing for hereditary cancer risks. Patient's maternal ancestors are of [place] descent, and paternal ancestors are of [place], [place], and [place] descent. There is no reported Ashkenazi Jewish ancestry. There is no known consanguinity.  GENETIC COUNSELING ASSESSMENT: Jo Mills is a 69 y.o. female with a personal and family history of cancer which is somewhat suggestive of a hereditary cancer syndrome and predisposition to cancer given the related diagnoses in multiple family members. We, therefore,  discussed and recommended the following at today's visit.   DISCUSSION: We discussed that 5 - 10% of cancer is hereditary, with most cases of hereditary breast cancer associated with mutations in BRCA1/2.  There are other genes that can be associated with hereditary breast cancer syndromes.  Type of cancer risk and level of risk are gene-specific.  We discussed that testing is beneficial for several reasons including knowing how to follow individuals after completing their treatment, identifying whether potential treatment options would be beneficial, and understanding if other family members could be at risk for cancer and allowing them to undergo genetic testing.   We reviewed the characteristics, features and inheritance patterns of hereditary cancer syndromes. We also discussed genetic testing, including the appropriate family members to test, the process of testing, insurance coverage and turn-around-time for results. We discussed the implications of a negative, positive, carrier and/or variant of uncertain significant result. We recommended Jo Mills pursue genetic testing for a panel that includes genes associated with breast and pancreatic cancer.   Jo Mills  was offered a common hereditary cancer panel (48 genes) and an expanded pan-cancer panel (85 genes). Jo Mills was informed of the benefits and limitations of each panel, including that expanded pan-cancer panels contain several preliminary evidence genes that do not have clear management guidelines at this point in time.  We also discussed that as the number of genes included on a panel increases, the chances of variants of uncertain significance increases.  After considering the benefits and limitations of each gene panel, Jo Mills elected to have a common hereditary cancers panel through [facility].  The Common Hereditary Cancers Panel offered by [facility] includes sequencing and/or deletion duplication testing of the following 48 genes:  APC, ATM, AXIN2, BARD1, BMPR1A, BRCA1, BRCA2, BRIP1, CDH1, CDK4, CDKN2A (p14ARF), CDKN2A (p16INK4a), CHEK2, CTNNA1, DICER1, EPCAM (Deletion/duplication testing only), GREM1 (promoter region deletion/duplication testing only), KIT, MEN1, MLH1, MSH2, MSH3, MSH6, MUTYH, NBN, NF1, NHTL1, PALB2, PDGFRA, PMS2, POLD1, POLE, PTEN, RAD50, RAD51C, RAD51D, RNF43, SDHB, SDHC, SDHD, SMAD4, SMARCA4. STK11, TP53, TSC1, TSC2, and VHL.  The following genes were evaluated for sequence changes only: SDHA and HOXB13 c.251G>A variant only.  Based on Ms. Lave's personal history of triple negative breast cancer and family history of breast and pancreatic cancer, she meets medical criteria for genetic testing. Despite that she meets criteria, she may still have an out of pocket cost. We discussed that if her out of pocket cost for testing is over $100, the laboratory will call and confirm whether she wants to proceed  with testing.  If the out of pocket cost of testing is less than $100 she will be billed by the genetic testing laboratory.  [facility] was contacted and made aware that Ms. Yeatt's would like to be called with her out of pocket estimate prior to proceeding with testing.   PLAN: After considering the risks, benefits, and limitations, Ms. Marban provided informed consent to pursue genetic testing.  She requested to be contacted by the laboratory with an out of pocket estimate.  She agreed to have her blood drawn for testing when she is at [facility] at the end of November.  The blood sample will be sent to [facility] for analysis of the Common Hereditary Cancer Panel. After the sample is sent, results should be available within approximately 3 weeks' time, at which point they will be disclosed by telephone to Ms. Neyra, as will any additional recommendations warranted by these results. Ms. Sawatzky will receive a summary of her genetic counseling visit and a copy of her results once available. This  information will also be available in Epic.   Lastly, we encouraged Ms. Racca to remain in contact with cancer genetics annually so that we can continuously update the family history and inform her of any changes in cancer genetics and testing that may be of benefit for this family.   Ms. Roesler questions were answered to her satisfaction today. Our contact information was provided should additional questions or concerns arise. Thank you for the referral and allowing [place] to share in the care of your patient.   Jo Mills M. Joette Catching, [place], [facility] Certified Film/video [email or web address] (P) [phone]  The patient was seen for a total of 40 minutes in telehealth genetic counseling.  This patient was discussed with Drs. Magrinat, [name] and/or Burr Medico who agrees with the above.   Ms. Ruggles was seen by Faith Rogue, MS, [place] on 09/06/2020 to confirm the above.   ___________________________________________________________________ For Office Staff:  Number of people involved in session: 1 Was an Intern/ student involved with case: no

## 2020-08-18 ENCOUNTER — Other Ambulatory Visit: Payer: Self-pay

## 2020-08-18 ENCOUNTER — Inpatient Hospital Stay (HOSPITAL_COMMUNITY): Payer: BC Managed Care – PPO | Attending: Hematology

## 2020-08-18 ENCOUNTER — Inpatient Hospital Stay (HOSPITAL_COMMUNITY): Payer: BC Managed Care – PPO

## 2020-08-18 ENCOUNTER — Ambulatory Visit (HOSPITAL_COMMUNITY): Payer: BC Managed Care – PPO

## 2020-08-18 ENCOUNTER — Inpatient Hospital Stay (HOSPITAL_COMMUNITY): Payer: BC Managed Care – PPO | Admitting: Hematology

## 2020-08-18 ENCOUNTER — Other Ambulatory Visit (HOSPITAL_COMMUNITY): Payer: BC Managed Care – PPO

## 2020-08-18 VITALS — BP 121/76 | HR 67 | Temp 96.7°F | Resp 18 | Wt 160.7 lb

## 2020-08-18 VITALS — BP 108/67 | HR 60 | Temp 97.2°F | Resp 18

## 2020-08-18 DIAGNOSIS — Z171 Estrogen receptor negative status [ER-]: Secondary | ICD-10-CM | POA: Diagnosis not present

## 2020-08-18 DIAGNOSIS — E876 Hypokalemia: Secondary | ICD-10-CM | POA: Diagnosis not present

## 2020-08-18 DIAGNOSIS — Z803 Family history of malignant neoplasm of breast: Secondary | ICD-10-CM | POA: Insufficient documentation

## 2020-08-18 DIAGNOSIS — C50812 Malignant neoplasm of overlapping sites of left female breast: Secondary | ICD-10-CM

## 2020-08-18 DIAGNOSIS — Z79899 Other long term (current) drug therapy: Secondary | ICD-10-CM | POA: Insufficient documentation

## 2020-08-18 DIAGNOSIS — Z800 Family history of malignant neoplasm of digestive organs: Secondary | ICD-10-CM | POA: Insufficient documentation

## 2020-08-18 DIAGNOSIS — B370 Candidal stomatitis: Secondary | ICD-10-CM | POA: Diagnosis not present

## 2020-08-18 DIAGNOSIS — R531 Weakness: Secondary | ICD-10-CM | POA: Insufficient documentation

## 2020-08-18 DIAGNOSIS — Z809 Family history of malignant neoplasm, unspecified: Secondary | ICD-10-CM | POA: Diagnosis not present

## 2020-08-18 DIAGNOSIS — Z5111 Encounter for antineoplastic chemotherapy: Secondary | ICD-10-CM | POA: Insufficient documentation

## 2020-08-18 DIAGNOSIS — Z8249 Family history of ischemic heart disease and other diseases of the circulatory system: Secondary | ICD-10-CM | POA: Diagnosis not present

## 2020-08-18 DIAGNOSIS — K5900 Constipation, unspecified: Secondary | ICD-10-CM | POA: Diagnosis not present

## 2020-08-18 DIAGNOSIS — R5383 Other fatigue: Secondary | ICD-10-CM | POA: Insufficient documentation

## 2020-08-18 DIAGNOSIS — Z5189 Encounter for other specified aftercare: Secondary | ICD-10-CM | POA: Diagnosis not present

## 2020-08-18 DIAGNOSIS — Z5112 Encounter for antineoplastic immunotherapy: Secondary | ICD-10-CM | POA: Insufficient documentation

## 2020-08-18 DIAGNOSIS — Z8379 Family history of other diseases of the digestive system: Secondary | ICD-10-CM | POA: Diagnosis not present

## 2020-08-18 LAB — CBC WITH DIFFERENTIAL/PLATELET
Abs Immature Granulocytes: 0.08 10*3/uL — ABNORMAL HIGH (ref 0.00–0.07)
Basophils Absolute: 0.1 10*3/uL (ref 0.0–0.1)
Basophils Relative: 2 %
Eosinophils Absolute: 0 10*3/uL (ref 0.0–0.5)
Eosinophils Relative: 1 %
HCT: 35.3 % — ABNORMAL LOW (ref 36.0–46.0)
Hemoglobin: 11.6 g/dL — ABNORMAL LOW (ref 12.0–15.0)
Immature Granulocytes: 1 %
Lymphocytes Relative: 21 %
Lymphs Abs: 1.4 10*3/uL (ref 0.7–4.0)
MCH: 33 pg (ref 26.0–34.0)
MCHC: 32.9 g/dL (ref 30.0–36.0)
MCV: 100.6 fL — ABNORMAL HIGH (ref 80.0–100.0)
Monocytes Absolute: 0.6 10*3/uL (ref 0.1–1.0)
Monocytes Relative: 9 %
Neutro Abs: 4.4 10*3/uL (ref 1.7–7.7)
Neutrophils Relative %: 66 %
Platelets: 392 10*3/uL (ref 150–400)
RBC: 3.51 MIL/uL — ABNORMAL LOW (ref 3.87–5.11)
RDW: 18.5 % — ABNORMAL HIGH (ref 11.5–15.5)
WBC: 6.6 10*3/uL (ref 4.0–10.5)
nRBC: 0 % (ref 0.0–0.2)

## 2020-08-18 LAB — COMPREHENSIVE METABOLIC PANEL
ALT: 18 U/L (ref 0–44)
AST: 25 U/L (ref 15–41)
Albumin: 3.7 g/dL (ref 3.5–5.0)
Alkaline Phosphatase: 68 U/L (ref 38–126)
Anion gap: 7 (ref 5–15)
BUN: 11 mg/dL (ref 8–23)
CO2: 25 mmol/L (ref 22–32)
Calcium: 9 mg/dL (ref 8.9–10.3)
Chloride: 105 mmol/L (ref 98–111)
Creatinine, Ser: 0.63 mg/dL (ref 0.44–1.00)
GFR, Estimated: >60 mL/min (ref >60)
Glucose, Bld: 112 mg/dL — ABNORMAL HIGH (ref 70–99)
Potassium: 3.3 mmol/L — ABNORMAL LOW (ref 3.5–5.1)
Sodium: 137 mmol/L (ref 135–145)
Total Bilirubin: 0.4 mg/dL (ref 0.3–1.2)
Total Protein: 6.4 g/dL — ABNORMAL LOW (ref 6.5–8.1)

## 2020-08-18 LAB — TSH: TSH: 39.149 u[IU]/mL — ABNORMAL HIGH (ref 0.350–4.500)

## 2020-08-18 MED ORDER — PALONOSETRON HCL INJECTION 0.25 MG/5ML
0.2500 mg | Freq: Once | INTRAVENOUS | Status: AC
  Administered 2020-08-18: 0.25 mg via INTRAVENOUS

## 2020-08-18 MED ORDER — POTASSIUM CHLORIDE CRYS ER 20 MEQ PO TBCR
20.0000 meq | EXTENDED_RELEASE_TABLET | Freq: Once | ORAL | Status: AC
  Administered 2020-08-18: 20 meq via ORAL
  Filled 2020-08-18: qty 1

## 2020-08-18 MED ORDER — DOXORUBICIN HCL CHEMO IV INJECTION 2 MG/ML
60.0000 mg/m2 | Freq: Once | INTRAVENOUS | Status: AC
  Administered 2020-08-18: 112 mg via INTRAVENOUS
  Filled 2020-08-18: qty 56

## 2020-08-18 MED ORDER — SODIUM CHLORIDE 0.9 % IV SOLN
10.0000 mg | Freq: Once | INTRAVENOUS | Status: AC
  Administered 2020-08-18: 10 mg via INTRAVENOUS
  Filled 2020-08-18: qty 10

## 2020-08-18 MED ORDER — SODIUM CHLORIDE 0.9 % IV SOLN
200.0000 mg | Freq: Once | INTRAVENOUS | Status: AC
  Administered 2020-08-18: 200 mg via INTRAVENOUS
  Filled 2020-08-18: qty 8

## 2020-08-18 MED ORDER — SODIUM CHLORIDE 0.9% FLUSH
10.0000 mL | INTRAVENOUS | Status: DC | PRN
  Administered 2020-08-18: 10 mL

## 2020-08-18 MED ORDER — SODIUM CHLORIDE 0.9 % IV SOLN
600.0000 mg/m2 | Freq: Once | INTRAVENOUS | Status: AC
  Administered 2020-08-18: 1120 mg via INTRAVENOUS
  Filled 2020-08-18: qty 56

## 2020-08-18 MED ORDER — PALONOSETRON HCL INJECTION 0.25 MG/5ML
INTRAVENOUS | Status: AC
  Filled 2020-08-18: qty 5

## 2020-08-18 MED ORDER — HEPARIN SOD (PORK) LOCK FLUSH 100 UNIT/ML IV SOLN
500.0000 [IU] | Freq: Once | INTRAVENOUS | Status: AC | PRN
  Administered 2020-08-18: 500 [IU]

## 2020-08-18 MED ORDER — SODIUM CHLORIDE 0.9 % IV SOLN
150.0000 mg | Freq: Once | INTRAVENOUS | Status: AC
  Administered 2020-08-18: 150 mg via INTRAVENOUS
  Filled 2020-08-18: qty 5

## 2020-08-18 MED ORDER — SODIUM CHLORIDE 0.9 % IV SOLN: Freq: Once | INTRAVENOUS | Status: AC

## 2020-08-18 NOTE — Patient Instructions (Signed)
[facility] Discharge Instructions for Patients Receiving Chemotherapy  Today you received the following chemotherapy agents   To help prevent nausea and vomiting after your treatment, we encourage you to take your nausea medication   If you develop nausea and vomiting that is not controlled by your nausea medication, call the clinic.   BELOW ARE SYMPTOMS THAT SHOULD BE REPORTED IMMEDIATELY:  *FEVER GREATER THAN 100.5 F  *CHILLS WITH OR WITHOUT FEVER  NAUSEA AND VOMITING THAT IS NOT CONTROLLED WITH YOUR NAUSEA MEDICATION  *UNUSUAL SHORTNESS OF BREATH  *UNUSUAL BRUISING OR BLEEDING  TENDERNESS IN MOUTH AND THROAT WITH OR WITHOUT PRESENCE OF ULCERS  *URINARY PROBLEMS  *BOWEL PROBLEMS  UNUSUAL RASH Items with * indicate a potential emergency and should be followed up as soon as possible.  Feel free to call the clinic should you have any questions or concerns. The clinic phone number is [phone].  Please show the CHEMO ALERT CARD at check-in to the Emergency Department and triage nurse.   

## 2020-08-18 NOTE — Patient Instructions (Signed)
[place] at [facility] Discharge Instructions  You were seen today by Dr. Delton Coombes. He went over your recent results. You received your treatment today. You may get your COVID booster shot at the beginning of week 3 after your chemo. Dr. Delton Coombes will see you back in 3 weeks for labs and follow up.   Thank you for choosing [place] at [facility] to provide your oncology and hematology care.  To afford each patient quality time with our provider, please arrive at least 15 minutes before your scheduled appointment time.   If you have a lab appointment with the [facility] please come in thru the Main Entrance and check in at the main information desk  You need to re-schedule your appointment should you arrive 10 or more minutes late.  We strive to give you quality time with our providers, and arriving late affects you and other patients whose appointments are after yours.  Also, if you no show three or more times for appointments you may be dismissed from the clinic at the providers discretion.     Again, thank you for choosing [facility].  Our hope is that these requests will decrease the amount of time that you wait before being seen by our physicians.       _____________________________________________________________  Should you have questions after your visit to [facility], please contact our office at [phone] between the hours of 8:00 a.m. and 4:30 p.m.  Voicemails left after 4:00 p.m. will not be returned until the following business day.  For prescription refill requests, have your pharmacy contact our office and allow 72 hours.    Cancer Center Support Programs:   > Cancer Support Group  2nd Tuesday of the month 1pm-2pm, Journey Room

## 2020-08-18 NOTE — Patient Instructions (Signed)
[facility] at [facility] Discharge Instructions  Labs drawn from portacath today   Thank you for choosing [facility] at [facility] to provide your oncology and hematology care.  To afford each patient quality time with our provider, please arrive at least 15 minutes before your scheduled appointment time.   If you have a lab appointment with the Cancer Center please come in thru the Main Entrance and check in at the main information desk.  You need to re-schedule your appointment should you arrive 10 or more minutes late.  We strive to give you quality time with our providers, and arriving late affects you and other patients whose appointments are after yours.  Also, if you no show three or more times for appointments you may be dismissed from the clinic at the providers discretion.     Again, thank you for choosing [facility].  Our hope is that these requests will decrease the amount of time that you wait before being seen by our physicians.       _____________________________________________________________  Should you have questions after your visit to  Cancer Center, please contact our office at [phone] and follow the prompts.  Our office hours are 8:00 a.m. and 4:30 p.m. Monday - Friday.  Please note that voicemails left after 4:00 p.m. may not be returned until the following business day.  We are closed weekends and major holidays.  You do have access to a nurse 24-7, just call the main number to the clinic [phone] and do not press any options, hold on the line and a nurse will answer the phone.    For prescription refill requests, have your pharmacy contact our office and allow 72 hours.    Due to Covid, you will need to wear a mask upon entering the hospital. If you do not have a mask, a mask will be given to you at the Main Entrance upon arrival. For doctor visits, patients may have 1 support person age 18  or older with them. For treatment visits, patients can not have anyone with them due to social distancing guidelines and our immunocompromised population.     

## 2020-08-18 NOTE — Progress Notes (Signed)
Pt here for C4D1.  Labs reviewed with Dr Raliegh Ip, K 3.3.  20 meq potassium PO given.  Okay for treatment.   Thyroid labs added today.   Tolerated treatment well today without incidence.  Discharged in stable condition ambulatory.  Vital signs stable prior to discharge.

## 2020-08-18 NOTE — Progress Notes (Signed)
[facility] [place], [place] [address]   CLINIC:  Medical Oncology/Hematology  PCP:  Roderic Scarce, MD [address] 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 Mammogram and US showed a 2.7cm mass at the 10:30 position, one left axillary lymph node with cortical thickening, calcifications in the lower inner left breast, and a 0.4cm group of calcifications in the right breast. Biopsy on 05/14/20 showed no malignancy in the right breast, and in the left breast, DCIS, high grade, ER/PR negative. 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    06/16/2020 -  Chemotherapy   The patient had dexamethasone (DECADRON) 4 MG tablet, 1 of 1 cycle, Start date: 06/08/2020, End date: -- DOXOrubicin (ADRIAMYCIN) chemo injection 112 mg, 60 mg/m2 = 112 mg, Intravenous,  Once, 3 of 4 cycles Administration: 112 mg (06/16/2020), 112 mg (07/07/2020), 112 mg (07/28/2020) palonosetron (ALOXI) injection 0.25 mg, 0.25 mg, Intravenous,  Once, 3 of 8 cycles Administration: 0.25 mg (06/16/2020), 0.25 mg (07/07/2020), 0.25 mg (07/28/2020) pegfilgrastim-bmez (ZIEXTENZO) injection 6 mg, 6 mg, Subcutaneous,  Once, 3 of 4 cycles Administration: 6 mg (06/19/2020), 6  mg (07/09/2020) CARBOplatin (PARAPLATIN) in sodium chloride 0.9 % 100 mL chemo infusion, , Intravenous,  Once, 0 of 4 cycles cyclophosphamide (CYTOXAN) 1,120 mg in sodium chloride 0.9 % 250 mL chemo infusion, 600 mg/m2 = 1,120 mg, Intravenous,  Once, 3 of 4 cycles Administration: 1,120 mg (06/16/2020), 1,120 mg (07/07/2020), 1,120 mg (07/28/2020) PACLitaxel (TAXOL) 150 mg in sodium chloride 0.9 % 250 mL chemo infusion (</= 86m/m2), 80 mg/m2, Intravenous,  Once, 0 of 4 cycles fosaprepitant (EMEND) 150 mg in sodium chloride 0.9 % 145 mL IVPB, 150 mg, Intravenous,  Once, 3 of 8 cycles Administration: 150 mg (06/16/2020), 150 mg (07/07/2020), 150 mg (07/28/2020) pembrolizumab (KEYTRUDA) 200 mg in sodium chloride 0.9 % 50 mL chemo infusion, 150 mg, Intravenous, Once, 3 of 8 cycles Dose modification: 200 mg (original dose 2 mg/kg, Cycle 2, Reason: Provider Judgment, Comment: flat dose per manufacturer) Administration: 200 mg (06/16/2020), 200 mg (07/07/2020), 200 mg (07/28/2020)  for chemotherapy treatment.      CANCER STAGING: Cancer Staging Malignant neoplasm of overlapping sites of left breast in female, estrogen receptor negative ([place] Staging form: Breast, AJCC 8th Edition - Clinical stage from 05/17/2020: Stage IIB (cT2, cN0, cM0, G3, ER-, PR-, HER2-) - Signed by CGardenia Phlegm NP on 05/26/2020   INTERVAL HISTORY:  Ms. Jo Jo Mills a 69y.o. female, returns for routine follow-up and consideration for next cycle of chemotherapy. Jo Jo Mills last seen on 07/28/2020.  Due for cycle #4 of AC and Keytruda today.   Today she is accompanied by her husband. Overall, she tells me she has been feeling pretty well.  She tolerated the previous treatment well and only reports having a sensation of thrush in her mouth for 2 days but without mouth sores or sore throat; she used Magic Mouthwash and it helped. She denies diarrhea or SOB. Her appetite is great. She notices that her hands cramp when her  potassium drops and drinks Pedialyte mixed with her water. She denies taking any herbs or supplements OTC. Her appetite is excellent.  She received both of her COVID shots. She received her flu shot and is planning on getting her COVID booster. She continues working in school.  Overall, she feels ready for next cycle of chemo today.    REVIEW OF SYSTEMS:  Review of Systems  Constitutional: Negative for appetite change and fatigue.  HENT:   Negative for mouth sores and sore throat.   Respiratory: Negative for shortness of breath.   Gastrointestinal: Negative for diarrhea.  All other systems reviewed and are negative.   PAST MEDICAL/SURGICAL HISTORY:  Past Medical History:  Diagnosis Date  . Allergy   . Breast cancer ([place])   . Cancer ([place])   . Diverticulitis   . Family history of breast cancer 08/17/2020  . Family history of pancreatic cancer 08/17/2020  . GERD (gastroesophageal reflux disease)   . Hypertension   . Skin cancer    Past Surgical History:  Procedure Laterality Date  . COLONOSCOPY    . PORTACATH PLACEMENT N/A 06/15/2020   Procedure: INSERTION PORT-A-CATH WITH ULTRASOUND GUIDANCE;  Surgeon: Donnie Mesa, MD;  Location: [facility];  Service: General;  Laterality: Right;  LMA, LEAVE PORT ACCESSED-CHEMO START 9/1    SOCIAL HISTORY:  Social History   Socioeconomic History  . Marital status: Married    Spouse name: Not on file  . Number of children: Not on file  . Years of education: Not on file  . Highest education level: Not on file  Occupational History  . Not on file  Tobacco Use  . Smoking status: Never Smoker  . Smokeless tobacco: Never Used  Substance and Sexual Activity  . Alcohol use: Not Currently  . Drug use: Never  . Sexual activity: Not on file  Other Topics Concern  . Not on file  Social History Narrative  . Not on file   Social Determinants of Health   Financial Resource Strain: Low Risk   . Difficulty of Paying Living  Expenses: Not hard at all  Food Insecurity: No Food Insecurity  . Worried About Charity fundraiser in the Last Year: Never true  . Ran Out of Food in the Last Year: Never true  Transportation Needs: No Transportation Needs  . Lack of Transportation (Medical): No  . Lack of Transportation (Non-Medical): No  Physical Activity: Sufficiently Active  . Days of Exercise per Week: 7 days  . Minutes of Exercise per Session: 60 min  Stress: Stress Concern Present  . Feeling of Stress : To some extent  Social Connections: Moderately Integrated  . Frequency of Communication with Friends and Family: Once a week  . Frequency of Social Gatherings with Friends and Family: Never  . Attends Religious Services: More than 4 times per year  . Active Member of Clubs or Organizations: Yes  . Attends Archivist Meetings: 1 to 4 times per year  . Marital Status: Married  Human resources officer Violence: Not At Risk  . Fear of Current or Ex-Partner: No  . Emotionally Abused: No  . Physically Abused: No  . Sexually Abused: No  FAMILY HISTORY:  Family History  Problem Relation Age of Onset  . Diverticulitis Mother   . Hypertension Maternal Aunt   . Hypertension Maternal Uncle   . Cancer Paternal Grandmother        unknown type; d. 65s  . Breast cancer Cousin 85       maternal   . Pancreatic cancer Cousin        dx 58s    CURRENT MEDICATIONS:  Current Outpatient Medications  Medication Sig Dispense Refill  . amLODipine-benazepril (LOTREL) 5-20 MG capsule Take 1 capsule by mouth daily.    . Bismuth Subsalicylate (PEPTO-BISMOL PO) Take by mouth as needed.    Marland Kitchen dexamethasone (DECADRON) 4 MG tablet Take 1 tablet day after chemo and 1 tablet 2 days after chemo with food 16 tablet 1  . fexofenadine (ALLEGRA) 180 MG tablet Take 180 mg by mouth daily.    . Lactobacillus-Inulin (CULTURELLE DIGESTIVE DAILY) CAPS Take 1 capsule by mouth daily.    Marland Kitchen LORazepam (ATIVAN) 0.5 MG tablet Take 1 tablet (0.5  mg total) by mouth at bedtime as needed for sleep. 30 tablet 0  . omeprazole (PRILOSEC) 20 MG capsule Take 20 mg by mouth daily.    Marland Kitchen lidocaine-prilocaine (EMLA) cream Apply to affected area once (Patient not taking: Reported on 08/18/2020) 30 g 3  . ondansetron (ZOFRAN) 8 MG tablet Take 1 tablet (8 mg total) by mouth 2 (two) times daily as needed. Start on the third day after chemotherapy. (Patient not taking: Reported on 08/18/2020) 30 tablet 1  . prochlorperazine (COMPAZINE) 10 MG tablet Take 1 tablet (10 mg total) by mouth every 6 (six) hours as needed (Nausea or vomiting). (Patient not taking: Reported on 08/18/2020) 30 tablet 1   No current facility-administered medications for this visit.    ALLERGIES:  Allergies  Allergen Reactions  . Grapefruit Extract Other (See Comments)    Other Reaction: Other reaction  . Pseudoephedrine Hcl Other (See Comments)    Other Reaction: Other reaction  . Nickel   . Pineapple   . Amoxicillin Rash    PHYSICAL EXAM:  Performance status (ECOG): 1 - Symptomatic but completely ambulatory  Vitals:   08/18/20 0942  BP: 121/76  Pulse: 67  Resp: 18  Temp: (!) 96.7 F (35.9 C)  SpO2: 98%   Wt Readings from Last 3 Encounters:  08/18/20 160 lb 11.2 oz (72.9 kg)  07/28/20 159 lb 6.4 oz (72.3 kg)  07/07/20 161 lb 9.6 oz (73.3 kg)   Physical Exam Vitals reviewed.  Constitutional:      Appearance: Normal appearance.  Cardiovascular:     Rate and Rhythm: Normal rate and regular rhythm.     Pulses: Normal pulses.     Heart sounds: Normal heart sounds.  Pulmonary:     Effort: Pulmonary effort is normal.     Breath sounds: Normal breath sounds.  Chest:     Comments: Port-a-Cath in R chest Abdominal:     Palpations: Abdomen is soft. There is no mass.     Tenderness: There is no abdominal tenderness.  Neurological:     General: No focal deficit present.     Mental Status: She is alert and oriented to person, place, and time.  Psychiatric:         Mood and Affect: Mood normal.        Behavior: Behavior normal.     LABORATORY DATA:  I have reviewed the labs as listed.  CBC Latest Ref Rng &  Units 08/18/2020 07/28/2020 07/07/2020  WBC 4.0 - 10.5 K/uL 6.6 5.6 6.6  Hemoglobin 12.0 - 15.0 g/dL 11.6(L) 11.5(L) 12.2  Hematocrit 36 - 46 % 35.3(L) 34.6(L) 36.5  Platelets 150 - 400 K/uL 392 358 364   CMP Latest Ref Rng & Units 08/18/2020 07/28/2020 07/07/2020  Glucose 70 - 99 mg/dL 112(H) 79 136(H)  BUN 8 - 23 mg/dL _0 Creatinine 0.44 - 1.00 mg/dL 0.63 0.51 0.69  Sodium 135 - 145 mmol/L 137 139 138  Potassium 3.5 - 5.1 mmol/L 3.3(L) 3.6 3.7  Chloride 98 - 111 mmol/L 105 106 105  CO2 22 - 32 mmol/L _1 Calcium 8.9 - 10.3 mg/dL 9.0 9.2 9.2  Total Protein 6.5 - 8.1 g/dL 6.4(L) 6.1(L) 6.3(L)  Total Bilirubin 0.3 - 1.2 mg/dL 0.4 0.5 0.6  Alkaline Phos 38 - 126 U/L 68 77 78  AST 15 - 41 U/L _2 ALT 0 - 44 U/L _3 DIAGNOSTIC IMAGING:  I have independently reviewed the scans and discussed with the patient. No results found.   ASSESSMENT:  1. Stage II (T2N0) left breast TNBC: -Felt left breast mass for 5 months. -Mammogram on 05/07/2020 with suspicious mass in the 10:30 o'clock position in the left breast. Suspicious calcifications in the anterior lower inner quadrant of the left breast. -MRI on 06/05/2020 shows left breast mass measuring 3.8 x 3.3 x 3.2 cm in the upper inner quadrant. 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 No suspicious lymphadenopathy. No MRI evidence of malignancy on the right. -Left breast upper inner quadrant biopsy on 05/14/2020 showed DCIS with calcifications. Right breast biopsy shows fibroadenomatoid nodule with calcifications. ER/PR was negative. -Left breast core needle biopsy at 10:30 position shows invasive ductal carcinoma. ER/PR/HER-2 was negative. Ki-67 85%. Left lymph node biopsy was reactive. -Patient was evaluated by Dr. [name] and Dr.  Georgette Dover. -She received first cycle of chemotherapy with Adriamycin and cyclophosphamide with pembrolizumab ([id number] trial)added for improved PCR by about 15%. -Cycle 1 of Adriamycin, cyclophosphamide and pembrolizumab on9/10/2019.  2. Family history: -Paternal cousin and second cousin had breast cancer. Paternal grandmother also had some type of cancer. -Another paternal cousin died of pancreatic cancer. Another paternal cousin had leukemia.   PLAN:  1. Triple negative left breast cancer: -She has tolerated cycle 3 very well.  Did not experience any immunotherapy related side effects. -Labs from today reviewed which showed normal LFTs and renal function.  White count and platelet count was normal. -She will proceed with cycle 4 of chemoimmunotherapy today. -She will come back in 3 weeks for follow-up.  She will start paclitaxel, weekly carboplatin AUC 1.5 and pembrolizumab every 3 weeks.  2. Family history: -She met with our geneticist on 08/16/2020.  3.  Hypokalemia: -Potassium is mildly low at 3.3. -She will receive potassium 20 mEq today.  4.  High risk drug monitoring: -Her TSH was 0.116 on 06/16/2020 prior to start of therapy. -TSH on 07/28/2020 was 0.078. -We will check her TSH, T3 and T4 levels as immunotherapy can alter these levels.   Orders placed this encounter:  No orders of the defined types were placed in this encounter.    Derek Jack, MD [facility] [phone]   I, Milinda Antis, am acting as a scribe for Dr. Sanda Linger.  I, Derek Jack MD, have reviewed the above documentation for accuracy and completeness, and I agree with the above.

## 2020-08-19 ENCOUNTER — Inpatient Hospital Stay (HOSPITAL_COMMUNITY): Payer: BC Managed Care – PPO

## 2020-08-19 ENCOUNTER — Encounter (HOSPITAL_COMMUNITY): Payer: Self-pay

## 2020-08-19 VITALS — BP 127/66 | HR 53 | Temp 97.0°F | Resp 18

## 2020-08-19 DIAGNOSIS — C50812 Malignant neoplasm of overlapping sites of left female breast: Secondary | ICD-10-CM

## 2020-08-19 DIAGNOSIS — Z5112 Encounter for antineoplastic immunotherapy: Secondary | ICD-10-CM | POA: Diagnosis not present

## 2020-08-19 LAB — T4: T4, Total: 1.7 ug/dL — ABNORMAL LOW (ref 4.5–12.0)

## 2020-08-19 LAB — T3: T3, Total: 26 ng/dL — ABNORMAL LOW (ref 71–180)

## 2020-08-19 MED ORDER — PEGFILGRASTIM-BMEZ 6 MG/0.6ML ~~LOC~~ SOSY
6.0000 mg | PREFILLED_SYRINGE | Freq: Once | SUBCUTANEOUS | Status: AC
  Administered 2020-08-19: 6 mg via SUBCUTANEOUS
  Filled 2020-08-19: qty 0.6

## 2020-08-19 NOTE — Patient Instructions (Signed)
[facility]  Discharge Instructions:   _______________________________________________________________  Thank you for choosing [facility] at [facility] to provide your oncology and hematology care.  To afford each patient quality time with our providers, please arrive at least 15 minutes before your scheduled appointment.  You need to re-schedule your appointment if you arrive 10 or more minutes late.  We strive to give you quality time with our providers, and arriving late affects you and other patients whose appointments are after yours.  Also, if you no show three or more times for appointments you may be dismissed from the clinic.  Again, thank you for choosing [facility]. Our hope is that these requests will allow you access to exceptional care and in a timely manner. _______________________________________________________________  If you have questions after your visit, please contact our office at [phone] between the hours of 8:30 a.m. and 5:00 p.m. Voicemails left after 4:30 p.m. will not be returned until the following business day. _______________________________________________________________  For prescription refill requests, have your pharmacy contact our office. _______________________________________________________________  Recommendations made by the consultant and any test results will be sent to your referring physician. _______________________________________________________________ 

## 2020-08-19 NOTE — Progress Notes (Signed)
Jo Mills presents today for injection per MD orders.  Ziextenzo administered SQ in left Upper Arm. Administration without incident. Patient tolerated well. No complaints at this time. Discharged from clinic ambulatory in stable condition. Alert and oriented x 3. F/U with [facility] as scheduled.

## 2020-08-23 ENCOUNTER — Other Ambulatory Visit (HOSPITAL_COMMUNITY): Payer: Self-pay | Admitting: *Deleted

## 2020-08-23 DIAGNOSIS — Z171 Estrogen receptor negative status [ER-]: Secondary | ICD-10-CM

## 2020-08-23 DIAGNOSIS — C50812 Malignant neoplasm of overlapping sites of left female breast: Secondary | ICD-10-CM

## 2020-08-23 NOTE — Progress Notes (Signed)
Orders placed for TSH, T3 and Free T4 per Dr. Delton Coombes.

## 2020-08-26 ENCOUNTER — Telehealth: Payer: Self-pay | Admitting: Genetic Counselor

## 2020-08-26 NOTE — Telephone Encounter (Signed)
Attempted to call to scheduled genetic counseling appointment with genetic counselor licensed in [place].  LVM requesting call back.

## 2020-09-03 ENCOUNTER — Encounter: Payer: Self-pay | Admitting: Genetic Counselor

## 2020-09-03 NOTE — Telephone Encounter (Signed)
Scheduled MyChart Video appointment with Jo Mills on 09/06/20 at 3pm.

## 2020-09-06 ENCOUNTER — Other Ambulatory Visit (HOSPITAL_COMMUNITY): Payer: Self-pay

## 2020-09-06 ENCOUNTER — Inpatient Hospital Stay (HOSPITAL_BASED_OUTPATIENT_CLINIC_OR_DEPARTMENT_OTHER): Payer: BC Managed Care – PPO | Admitting: Licensed Clinical Social Worker

## 2020-09-06 DIAGNOSIS — Z171 Estrogen receptor negative status [ER-]: Secondary | ICD-10-CM

## 2020-09-06 DIAGNOSIS — Z803 Family history of malignant neoplasm of breast: Secondary | ICD-10-CM

## 2020-09-06 DIAGNOSIS — C50812 Malignant neoplasm of overlapping sites of left female breast: Secondary | ICD-10-CM

## 2020-09-06 DIAGNOSIS — Z800 Family history of malignant neoplasm of digestive organs: Secondary | ICD-10-CM

## 2020-09-06 MED ORDER — FLUCONAZOLE 150 MG PO TABS: 150.0000 mg | ORAL_TABLET | Freq: Once | ORAL | 0 refills | Status: AC

## 2020-09-06 NOTE — Telephone Encounter (Signed)
Patient called the clinic reporting that she believes she has a yeast infection. Symptoms include vaginal discharge and itching. Order placed for diflucan per Dr. Delton Coombes.

## 2020-09-06 NOTE — Progress Notes (Signed)
REFERRING PROVIDER: Derek Jack, MD [address] [place],  [place] [address]  PRIMARY PROVIDER:  Roderic Scarce, MD  PRIMARY REASON FOR VISIT:  1. Malignant neoplasm of overlapping sites of left breast in female, estrogen receptor negative ([place])   2. Family history of breast cancer   3. Family history of pancreatic cancer      HISTORY OF PRESENT ILLNESS:   Jo Mills, a 69 y.o. female, was seen for a [facility] genetics consultation at the request of Dr. Delton Coombes due to a personal and family history of cancer.  Jo Mills presents to clinic today to discuss the possibility of a hereditary predisposition to cancer, genetic testing, and to further clarify her future cancer risks, as well as potential cancer risks for family members.   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 The preliminary treatment plan includes neoadjuvant chemotherapy, breast conserving surgery, and radiation.  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   CANCER HISTORY:  Oncology History  Malignant neoplasm of overlapping sites of left breast in female, estrogen receptor negative ([place])  05/17/2020 Cancer Staging   Staging form: Breast, AJCC 8th Edition - Clinical stage from 05/17/2020: Stage IIB (cT2, cN0, cM0, G3, ER-, PR-, HER2-) - Signed by Gardenia Phlegm, NP on 05/26/2020   05/26/2020 Initial Diagnosis   Patient palpated a left breast mass for 5 months. Mammogram and US showed a 2.7cm mass at the 10:30 position, one left axillary lymph node with cortical thickening, calcifications in the lower inner left breast, and a 0.4cm group of calcifications in the right breast. Biopsy on 05/14/20 showed no malignancy in the right breast, and in the left breast, DCIS, high grade, ER/PR negative. Biopsy on 05/17/20 showed no malignancy in the axilla, and  invasive ductal carcinoma at the 10:30 position in the left breast, grade 3, HER-2 negative (1+), ER/PR negative, Ki67 85%.    06/16/2020 -  Chemotherapy   The patient had dexamethasone (DECADRON) 4 MG tablet, 1 of 1 cycle, Start date: 06/08/2020, End date: -- DOXOrubicin (ADRIAMYCIN) chemo injection 112 mg, 60 mg/m2 = 112 mg, Intravenous,  Once, 4 of 4 cycles Administration: 112 mg (06/16/2020), 112 mg (07/07/2020), 112 mg (07/28/2020), 112 mg (08/18/2020) palonosetron (ALOXI) injection 0.25 mg, 0.25 mg, Intravenous,  Once, 4 of 8 cycles Administration: 0.25 mg (06/16/2020), 0.25 mg (07/07/2020), 0.25 mg (07/28/2020), 0.25 mg (08/18/2020) pegfilgrastim-bmez (ZIEXTENZO) injection 6 mg, 6 mg, Subcutaneous,  Once, 4 of 4 cycles Administration: 6 mg (06/19/2020), 6 mg (07/09/2020), 6 mg (07/29/2020), 6 mg (08/19/2020) CARBOplatin (PARAPLATIN) in sodium chloride 0.9 % 100 mL chemo infusion, , Intravenous,  Once, 0 of 4 cycles cyclophosphamide (CYTOXAN) 1,120 mg in sodium chloride 0.9 % 250 mL chemo infusion, 600 mg/m2 = 1,120 mg, Intravenous,  Once, 4 of 4 cycles Administration: 1,120 mg (06/16/2020), 1,120 mg (07/07/2020), 1,120 mg (07/28/2020), 1,120 mg (08/18/2020) PACLitaxel (TAXOL) 150 mg in sodium chloride 0.9 % 250 mL chemo infusion (</= 68m/m2), 80 mg/m2, Intravenous,  Once, 0 of 4 cycles fosaprepitant (EMEND) 150 mg in sodium chloride 0.9 % 145 mL IVPB, 150 mg, Intravenous,  Once, 4 of 8 cycles Administration: 150 mg (06/16/2020), 150 mg (07/07/2020), 150 mg (07/28/2020), 150 mg (08/18/2020) pembrolizumab (KEYTRUDA) 200 mg in sodium chloride 0.9 % 50 mL chemo infusion, 150 mg, Intravenous, Once, 4 of 8 cycles  Dose modification: 200 mg (original dose 2 mg/kg, Cycle 2, Reason: Provider Judgment, Comment: flat dose per manufacturer) Administration: 200 mg (06/16/2020), 200 mg (07/07/2020), 200 mg (07/28/2020), 200 mg (08/18/2020)  for chemotherapy treatment.       RISK FACTORS:  Menarche was at age 31.   Nulliparous OCP use for approximately 7 years.  Ovaries intact: yes.  Hysterectomy: no.  Menopausal status: postmenopausal.  HRT use: 0 years. Colonoscopy: yes; 10 years ago per patient. Mammogram within the last year: yes. Number of breast biopsies: 1. Up to date with pelvic exams: most recent 6-7 years ago Any excessive radiation exposure in the past: no  Past Medical History:  Diagnosis Date  . Allergy   . Breast cancer ([place])   . Cancer ([place])   . Diverticulitis   . Family history of breast cancer 08/17/2020  . Family history of pancreatic cancer 08/17/2020  . GERD (gastroesophageal reflux disease)   . Hypertension   . Skin cancer     Past Surgical History:  Procedure Laterality Date  . COLONOSCOPY    . PORTACATH PLACEMENT N/A 06/15/2020   Procedure: INSERTION PORT-A-CATH WITH ULTRASOUND GUIDANCE;  Surgeon: Donnie Mesa, MD;  Location: [facility];  Service: General;  Laterality: Right;  LMA, LEAVE PORT ACCESSED-CHEMO START 9/1    Social History   Socioeconomic History  . Marital status: Married    Spouse name: Not on file  . Number of children: Not on file  . Years of education: Not on file  . Highest education level: Not on file  Occupational History  . Not on file  Tobacco Use  . Smoking status: Never Smoker  . Smokeless tobacco: Never Used  Substance and Sexual Activity  . Alcohol use: Not Currently  . Drug use: Never  . Sexual activity: Not on file  Other Topics Concern  . Not on file  Social History Narrative  . Not on file   Social Determinants of Health   Financial Resource Strain: Low Risk   . Difficulty of Paying Living Expenses: Not hard at all  Food Insecurity: No Food Insecurity  . Worried About Charity fundraiser in the Last Year: Never true  . Ran Out of Food in the Last Year: Never true  Transportation Needs: No Transportation Needs  . Lack of Transportation (Medical): No  . Lack of Transportation (Non-Medical): No   Physical Activity: Sufficiently Active  . Days of Exercise per Week: 7 days  . Minutes of Exercise per Session: 60 min  Stress: Stress Concern Present  . Feeling of Stress : To some extent  Social Connections: Moderately Integrated  . Frequency of Communication with Friends and Family: Once a week  . Frequency of Social Gatherings with Friends and Family: Never  . Attends Religious Services: More than 4 times per year  . Active Member of Clubs or Organizations: Yes  . Attends Archivist Meetings: 1 to 4 times per year  . Marital Status: Married     FAMILY HISTORY:  We obtained a detailed, 4-generation family history.  Significant diagnoses are listed below: Family History  Problem Relation Age of Onset  . Diverticulitis Mother   . Hypertension Maternal Aunt   . Hypertension Maternal Uncle   . Cancer Paternal Grandmother        unknown type; d. 54s  . Breast cancer Cousin 82       paternal   . Pancreatic cancer Cousin  paternal; dx 17s     Ms. Haymond's mother passed away at age 59 and did not have cancer.  No maternal family history of cancer was reported.  Ms. George Ina father passed away at age 12 and did not have cancer.  Ms. Inda Coke has a paternal cousin diagnosed with breast cancer at age 50 and a paternal cousin diagnosed with pancreatic cancer in his 37s.  Ms. George Ina paternal grandmother had an unknown type of cancer and passed away in her 54s.  No other paternal family history of cancer was reported.   Ms. Gathright is unaware of previous family history of genetic testing for hereditary cancer risks. Patient's maternal ancestors are of [place] descent, and paternal ancestors are of [place], [place], and [place] descent. There is no reported Ashkenazi Jewish ancestry. There is no known consanguinity.  GENETIC COUNSELING ASSESSMENT: Ms. Beagley is a 69 y.o. female with a personal and family history of cancer which is somewhat suggestive of a hereditary cancer syndrome  and predisposition to cancer. We, therefore, discussed and recommended the following at today's visit.   DISCUSSION: We discussed that approximately 5-10% of cancer is hereditary  Most cases of hereditary breast cancer are associated with BRCA1/BRCA2 genes, although there are other genes associated with hereditary cancer as well. Type of cancer risk and level of risk are gene-specific. We discussed that testing is beneficial for several reasons including knowing how to follow individuals after completing their treatment, identifying whether potential treatment options would be beneficial, and understanding if other family members could be at risk for cancer and allowing them to undergo genetic testing.  We reviewed the characteristics, features and inheritance patterns of hereditary cancer syndromes. We also discussed genetic testing, including the appropriate family members to test, the process of testing, insurance coverage and turn-around-time for results. We discussed the implications of a negative, positive and/or variant of uncertain significant result. We recommended Ms. Keller pursue genetic testing for the [facility] Common Hereditary Cancers gene panel.   The Common Hereditary Cancers Panel offered by [facility] includes sequencing and/or deletion duplication testing of the following 48 genes: APC, ATM, AXIN2, BARD1, BMPR1A, BRCA1, BRCA2, BRIP1, CDH1, CDKN2A (p14ARF), CDKN2A (p16INK4a), CKD4, CHEK2, CTNNA1, DICER1, EPCAM (Deletion/duplication testing only), GREM1 (promoter region deletion/duplication testing only), KIT, MEN1, MLH1, MSH2, MSH3, MSH6, MUTYH, NBN, NF1, NHTL1, PALB2, PDGFRA, PMS2, POLD1, POLE, PTEN, RAD50, RAD51C, RAD51D, RNF43, SDHB, SDHC, SDHD, SMAD4, SMARCA4. STK11, TP53, TSC1, TSC2, and VHL.  The following genes were evaluated for sequence changes only: SDHA and HOXB13 c.251G>A variant only.  Based on Ms. Tawney's personal and family history of cancer, she meets medical criteria for  genetic testing. Despite that she meets criteria, she may still have an out of pocket cost. We discussed that if her out of pocket cost for testing is over $100, the laboratory will call and confirm whether she wants to proceed with testing.  If the out of pocket cost of testing is less than $100 she will be billed by the genetic testing laboratory.   PLAN: After considering the risks, benefits, and limitations, Ms. Marston provided informed consent to pursue genetic testing. She will have her blood drawn on 11/24 and the blood sample will be sent to [facility] for analysis of the Common Hereditary Cancers Panel. Results should be available within approximately 2-3 weeks' time, at which point they will be disclosed by telephone to Ms. Nipp, as will any additional recommendations warranted by these results. Ms. Mcaulay will receive a summary of her genetic  counseling visit and a copy of her results once available. This information will also be available in Epic.   Ms. Chopra questions were answered to her satisfaction today. Our contact information was provided should additional questions or concerns arise. Thank you for the referral and allowing [place] to share in the care of your patient.   Faith Rogue, MS, [facility] Genetic Counselor [email or web address] Phone: [phone]  The patient was seen for a total of 40 minutes in face-to-face genetic counseling.  Dr. Grayland Ormond was available for discussion regarding this case.   _______________________________________________________________________ For Office Staff:  Number of people involved in session: 1 Was an Intern/ student involved with case: no

## 2020-09-08 ENCOUNTER — Other Ambulatory Visit (HOSPITAL_COMMUNITY): Payer: BC Managed Care – PPO

## 2020-09-08 ENCOUNTER — Inpatient Hospital Stay (HOSPITAL_COMMUNITY): Payer: BC Managed Care – PPO

## 2020-09-08 ENCOUNTER — Inpatient Hospital Stay (HOSPITAL_BASED_OUTPATIENT_CLINIC_OR_DEPARTMENT_OTHER): Payer: BC Managed Care – PPO | Admitting: Hematology

## 2020-09-08 ENCOUNTER — Other Ambulatory Visit: Payer: Self-pay

## 2020-09-08 VITALS — BP 124/78 | HR 69 | Temp 97.2°F | Resp 17

## 2020-09-08 VITALS — BP 134/79 | HR 74 | Temp 96.8°F | Resp 18 | Wt 159.6 lb

## 2020-09-08 DIAGNOSIS — Z171 Estrogen receptor negative status [ER-]: Secondary | ICD-10-CM

## 2020-09-08 DIAGNOSIS — C50812 Malignant neoplasm of overlapping sites of left female breast: Secondary | ICD-10-CM

## 2020-09-08 DIAGNOSIS — Z5112 Encounter for antineoplastic immunotherapy: Secondary | ICD-10-CM | POA: Diagnosis not present

## 2020-09-08 LAB — COMPREHENSIVE METABOLIC PANEL
ALT: 26 U/L (ref 0–44)
AST: 36 U/L (ref 15–41)
Albumin: 3.9 g/dL (ref 3.5–5.0)
Alkaline Phosphatase: 72 U/L (ref 38–126)
Anion gap: 6 (ref 5–15)
BUN: 8 mg/dL (ref 8–23)
CO2: 26 mmol/L (ref 22–32)
Calcium: 9.2 mg/dL (ref 8.9–10.3)
Chloride: 105 mmol/L (ref 98–111)
Creatinine, Ser: 0.72 mg/dL (ref 0.44–1.00)
GFR, Estimated: >60 mL/min (ref >60)
Glucose, Bld: 104 mg/dL — ABNORMAL HIGH (ref 70–99)
Potassium: 3.6 mmol/L (ref 3.5–5.1)
Sodium: 137 mmol/L (ref 135–145)
Total Bilirubin: 0.2 mg/dL — ABNORMAL LOW (ref 0.3–1.2)
Total Protein: 6.8 g/dL (ref 6.5–8.1)

## 2020-09-08 LAB — CBC WITH DIFFERENTIAL/PLATELET
Abs Immature Granulocytes: 0.15 10*3/uL — ABNORMAL HIGH (ref 0.00–0.07)
Basophils Absolute: 0.2 10*3/uL — ABNORMAL HIGH (ref 0.0–0.1)
Basophils Relative: 2 %
Eosinophils Absolute: 0.1 10*3/uL (ref 0.0–0.5)
Eosinophils Relative: 1 %
HCT: 35 % — ABNORMAL LOW (ref 36.0–46.0)
Hemoglobin: 11.5 g/dL — ABNORMAL LOW (ref 12.0–15.0)
Immature Granulocytes: 2 %
Lymphocytes Relative: 14 %
Lymphs Abs: 1.1 10*3/uL (ref 0.7–4.0)
MCH: 34.1 pg — ABNORMAL HIGH (ref 26.0–34.0)
MCHC: 32.9 g/dL (ref 30.0–36.0)
MCV: 103.9 fL — ABNORMAL HIGH (ref 80.0–100.0)
Monocytes Absolute: 0.7 10*3/uL (ref 0.1–1.0)
Monocytes Relative: 9 %
Neutro Abs: 5.8 10*3/uL (ref 1.7–7.7)
Neutrophils Relative %: 72 %
Platelets: 361 10*3/uL (ref 150–400)
RBC: 3.37 MIL/uL — ABNORMAL LOW (ref 3.87–5.11)
RDW: 20.1 % — ABNORMAL HIGH (ref 11.5–15.5)
WBC: 8 10*3/uL (ref 4.0–10.5)
nRBC: 0 % (ref 0.0–0.2)

## 2020-09-08 LAB — T4, FREE: Free T4: <0.25 ng/dL — ABNORMAL LOW (ref 0.61–1.12)

## 2020-09-08 LAB — TSH: TSH: 71.645 u[IU]/mL — ABNORMAL HIGH (ref 0.350–4.500)

## 2020-09-08 MED ORDER — SODIUM CHLORIDE 0.9 % IV SOLN
10.0000 mg | Freq: Once | INTRAVENOUS | Status: AC
  Administered 2020-09-08: 10 mg via INTRAVENOUS
  Filled 2020-09-08: qty 10

## 2020-09-08 MED ORDER — SODIUM CHLORIDE 0.9 % IV SOLN
134.8500 mg | Freq: Once | INTRAVENOUS | Status: AC
  Administered 2020-09-08: 130 mg via INTRAVENOUS
  Filled 2020-09-08: qty 13

## 2020-09-08 MED ORDER — PALONOSETRON HCL INJECTION 0.25 MG/5ML
0.2500 mg | Freq: Once | INTRAVENOUS | Status: AC
  Administered 2020-09-08: 0.25 mg via INTRAVENOUS

## 2020-09-08 MED ORDER — FAMOTIDINE IN NACL 20-0.9 MG/50ML-% IV SOLN
INTRAVENOUS | Status: AC
  Filled 2020-09-08: qty 50

## 2020-09-08 MED ORDER — LEVOTHYROXINE SODIUM 50 MCG PO TABS: 50.0000 ug | ORAL_TABLET | Freq: Every day | ORAL | 0 refills | Status: DC

## 2020-09-08 MED ORDER — SODIUM CHLORIDE 0.9 % IV SOLN: Freq: Once | INTRAVENOUS | Status: AC

## 2020-09-08 MED ORDER — FAMOTIDINE IN NACL 20-0.9 MG/50ML-% IV SOLN
20.0000 mg | Freq: Once | INTRAVENOUS | Status: AC
  Administered 2020-09-08: 20 mg via INTRAVENOUS

## 2020-09-08 MED ORDER — SODIUM CHLORIDE 0.9 % IV SOLN
200.0000 mg | Freq: Once | INTRAVENOUS | Status: AC
  Administered 2020-09-08: 200 mg via INTRAVENOUS
  Filled 2020-09-08: qty 8

## 2020-09-08 MED ORDER — HEPARIN SOD (PORK) LOCK FLUSH 100 UNIT/ML IV SOLN
500.0000 [IU] | Freq: Once | INTRAVENOUS | Status: AC | PRN
  Administered 2020-09-08: 500 [IU]

## 2020-09-08 MED ORDER — DIPHENHYDRAMINE HCL 50 MG/ML IJ SOLN
50.0000 mg | Freq: Once | INTRAMUSCULAR | Status: AC
  Administered 2020-09-08: 50 mg via INTRAVENOUS

## 2020-09-08 MED ORDER — SODIUM CHLORIDE 0.9 % IV SOLN
80.0000 mg/m2 | Freq: Once | INTRAVENOUS | Status: AC
  Administered 2020-09-08: 150 mg via INTRAVENOUS
  Filled 2020-09-08: qty 25

## 2020-09-08 MED ORDER — PALONOSETRON HCL INJECTION 0.25 MG/5ML
INTRAVENOUS | Status: AC
  Filled 2020-09-08: qty 5

## 2020-09-08 MED ORDER — DIPHENHYDRAMINE HCL 50 MG/ML IJ SOLN
INTRAMUSCULAR | Status: AC
  Filled 2020-09-08: qty 1

## 2020-09-08 MED ORDER — SODIUM CHLORIDE 0.9% FLUSH
10.0000 mL | INTRAVENOUS | Status: DC | PRN
  Administered 2020-09-08: 10 mL

## 2020-09-08 MED ORDER — SODIUM CHLORIDE 0.9 % IV SOLN
150.0000 mg | Freq: Once | INTRAVENOUS | Status: AC
  Administered 2020-09-08: 150 mg via INTRAVENOUS
  Filled 2020-09-08: qty 150

## 2020-09-08 NOTE — Progress Notes (Signed)
Jo Mills, Jo Mills   CLINIC:  Medical Oncology/Hematology  PCP:  Jo Scarce, MD [address] 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 Mammogram and US showed a 2.7cm mass at the 10:30 position, one left axillary lymph node with cortical thickening, calcifications in the lower inner left breast, and a 0.4cm group of calcifications in the right breast. Biopsy on 05/14/20 showed no malignancy in the right breast, and in the left breast, DCIS, high grade, ER/PR negative. 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    06/16/2020 -  Chemotherapy   The patient had dexamethasone (DECADRON) 4 MG tablet, 1 of 1 cycle, Start date: 06/08/2020, End date: -- DOXOrubicin (ADRIAMYCIN) chemo injection 112 mg, 60 mg/m2 = 112 mg, Intravenous,  Once, 4 of 4 cycles Administration: 112 mg (06/16/2020), 112 mg (07/07/2020), 112 mg (07/28/2020), 112 mg (08/18/2020) palonosetron (ALOXI) injection 0.25 mg, 0.25 mg, Intravenous,  Once, 4 of 8 cycles Administration: 0.25 mg (06/16/2020), 0.25 mg (07/07/2020), 0.25 mg (07/28/2020), 0.25 mg (08/18/2020) pegfilgrastim-bmez (ZIEXTENZO) injection 6 mg, 6 mg,  Subcutaneous,  Once, 4 of 4 cycles Administration: 6 mg (06/19/2020), 6 mg (07/09/2020), 6 mg (07/29/2020), 6 mg (08/19/2020) CARBOplatin (PARAPLATIN) 130 mg in sodium chloride 0.9 % 100 mL chemo infusion, 130 mg (original dose ), Intravenous,  Once, 0 of 4 cycles Dose modification:   (Cycle 5) cyclophosphamide (CYTOXAN) 1,120 mg in sodium chloride 0.9 % 250 mL chemo infusion, 600 mg/m2 = 1,120 mg, Intravenous,  Once, 4 of 4 cycles Administration: 1,120 mg (06/16/2020), 1,120 mg (07/07/2020), 1,120 mg (07/28/2020), 1,120 mg (08/18/2020) PACLitaxel (TAXOL) 150 mg in sodium chloride 0.9 % 250 mL chemo infusion (</= $RemoveBefor'80mg'iMuWASLlLiOv$ /m2), 80 mg/m2 = 150 mg, Intravenous,  Once, 0 of 4 cycles fosaprepitant (EMEND) 150 mg in sodium chloride 0.9 % 145 mL IVPB, 150 mg, Intravenous,  Once, 4 of 8 cycles Administration: 150 mg (06/16/2020), 150 mg (07/07/2020), 150 mg (07/28/2020), 150 mg (08/18/2020) pembrolizumab (KEYTRUDA) 200 mg in sodium chloride 0.9 % 50 mL chemo infusion, 150 mg, Intravenous, Once, 4 of 8 cycles Dose modification: 200 mg (original dose 2 mg/kg, Cycle 2, Reason: Provider Judgment, Comment: flat dose per manufacturer) Administration: 200 mg (06/16/2020), 200 mg (07/07/2020), 200 mg (07/28/2020), 200 mg (08/18/2020)  for chemotherapy treatment.      CANCER STAGING: Cancer Staging Malignant neoplasm of overlapping sites of left breast in female, estrogen receptor negative ([place]) Staging form: Breast, AJCC 8th Edition - Clinical stage from 05/17/2020: Stage IIB (cT2, cN0, cM0, G3, ER-, PR-, HER2-) - Signed by Gardenia Phlegm, NP on 05/26/2020   INTERVAL HISTORY:  [name], a 69 y.o. female, returns for routine follow-up and consideration for next cycle  of chemotherapy. Jo Mills was last seen on 08/18/2020.  Due for cycle #5 of carboplatin, paclitaxel and Keytruda today.   Today she is accompanied by her husband. Overall, she tells me she has been feeling well. She reports that she had hiccups,  light sensitivity in her eyes causing her to tear up, cold sensitivity, swelling in her arms and legs, and weakness in her legs for 4 days after her previous treatment. She denies getting diarrhea during the treatment, though she reported having constipation and taking a stool softener for it. She got her diflucan shot yesterday. She examines her breasts daily and denies detecting any issues. She reports that Compazine did not really help with her nausea and that Decadron worked better.    She got her COVID booster on 11/17.  Overall, she feels ready for next cycle of chemo today.    REVIEW OF SYSTEMS:  Review of Systems  Constitutional: Positive for fatigue (75%). Negative for appetite change.  Cardiovascular: Negative for chest pain.  Gastrointestinal: Positive for constipation (on stool softener). Negative for diarrhea.  Musculoskeletal:       Swelling in arms and legs for 4 days after Tx  Neurological: Positive for extremity weakness (in legs after Tx).       Hiccups and light sensitivity for 4 days after Tx  All other systems reviewed and are negative.   PAST MEDICAL/SURGICAL HISTORY:  Past Medical History:  Diagnosis Date   Allergy    Breast cancer ([place])    Cancer ([place])    Diverticulitis    Family history of breast cancer 08/17/2020   Family history of pancreatic cancer 08/17/2020   GERD (gastroesophageal reflux disease)    Hypertension    Skin cancer    Past Surgical History:  Procedure Laterality Date   COLONOSCOPY     PORTACATH PLACEMENT N/A 06/15/2020   Procedure: INSERTION PORT-A-CATH WITH ULTRASOUND GUIDANCE;  Surgeon: Donnie Mesa, MD;  Location: [facility];  Service: General;  Laterality: Right;  LMA, LEAVE PORT ACCESSED-CHEMO START 9/1    SOCIAL HISTORY:  Social History   Socioeconomic History   Marital status: Married    Spouse name: Not on file   Number of children: Not on file   Years of education: Not on file   Highest  education level: Not on file  Occupational History   Not on file  Tobacco Use   Smoking status: Never Smoker   Smokeless tobacco: Never Used  Substance and Sexual Activity   Alcohol use: Not Currently   Drug use: Never   Sexual activity: Not on file  Other Topics Concern   Not on file  Social History Narrative   Not on file   Social Determinants of Health   Financial Resource Strain: Low Risk    Difficulty of Paying Living Expenses: Not hard at all  Food Insecurity: No Food Insecurity   Worried About Charity fundraiser in the Last Year: Never true   [place] in the Last Year: Never true  Transportation Needs: No Transportation Needs   Lack of Transportation (Medical): No   Lack of Transportation (Non-Medical): No  Physical Activity: Sufficiently Active   Days of Exercise per Week: 7 days   Minutes of Exercise per Session: 60 min  Stress: Stress Concern Present   Feeling of Stress : To some extent  Social Connections: Moderately Integrated   Frequency of Communication with Friends and Family: Once a week   Frequency of Social Gatherings  with Friends and Family: Never   Attends Religious Services: More than 4 times per year   Active Member of Clubs or Organizations: Yes   Attends Archivist Meetings: 1 to 4 times per year   Marital Status: Married  Human resources officer Violence: Not At Risk   Fear of Current or Ex-Partner: No   Emotionally Abused: No   Physically Abused: No   Sexually Abused: No    FAMILY HISTORY:  Family History  Problem Relation Age of Onset   Diverticulitis Mother    Hypertension Maternal Aunt    Hypertension Maternal Uncle    Cancer Paternal Grandmother        unknown type; d. 56s   Breast cancer Cousin 16       paternal    Pancreatic cancer Cousin        paternal; dx 42s    CURRENT MEDICATIONS:  Current Outpatient Medications  Medication Sig Dispense Refill   amLODipine-benazepril (LOTREL)  5-20 MG capsule Take 1 capsule by mouth daily.     Bismuth Subsalicylate (PEPTO-BISMOL PO) Take by mouth as needed.     dexamethasone (DECADRON) 4 MG tablet Take 1 tablet day after chemo and 1 tablet 2 days after chemo with food 16 tablet 1   fexofenadine (ALLEGRA) 180 MG tablet Take 180 mg by mouth daily.     Lactobacillus-Inulin (CULTURELLE DIGESTIVE DAILY) CAPS Take 1 capsule by mouth daily.     lidocaine-prilocaine (EMLA) cream Apply to affected area once 30 g 3   LORazepam (ATIVAN) 0.5 MG tablet Take 1 tablet (0.5 mg total) by mouth at bedtime as needed for sleep. 30 tablet 0   omeprazole (PRILOSEC) 20 MG capsule Take 20 mg by mouth daily.     ondansetron (ZOFRAN) 8 MG tablet Take 1 tablet (8 mg total) by mouth 2 (two) times daily as needed. Start on the third day after chemotherapy. 30 tablet 1   prochlorperazine (COMPAZINE) 10 MG tablet Take 1 tablet (10 mg total) by mouth every 6 (six) hours as needed (Nausea or vomiting). 30 tablet 1   No current facility-administered medications for this visit.    ALLERGIES:  Allergies  Allergen Reactions   Grapefruit Extract Other (See Comments)    Other Reaction: Other reaction   Pseudoephedrine Hcl Other (See Comments)    Other Reaction: Other reaction   Nickel    Pineapple    Amoxicillin Rash    PHYSICAL EXAM:  Performance status (ECOG): 1 - Symptomatic but completely ambulatory  Vitals:   09/08/20 0809  BP: 134/79  Pulse: 74  Resp: 18  Temp: (!) 96.8 F (36 C)  SpO2: 98%   Wt Readings from Last 3 Encounters:  09/08/20 159 lb 9.6 oz (72.4 kg)  08/18/20 160 lb 11.2 oz (72.9 kg)  07/28/20 159 lb 6.4 oz (72.3 kg)   Physical Exam Vitals reviewed.  Constitutional:      Appearance: Normal appearance.  Cardiovascular:     Rate and Rhythm: Normal rate and regular rhythm.     Pulses: Normal pulses.     Heart sounds: Normal heart sounds.  Pulmonary:     Effort: Pulmonary effort is normal.     Breath sounds:  Normal breath sounds.  Chest:     Breasts:        Left: No swelling, inverted nipple, mass, nipple discharge, skin change or tenderness.     Comments: Port-a-Cath in R chest Musculoskeletal:     Right lower leg: No edema.  Left lower leg: No edema.  Lymphadenopathy:     Upper Body:     Left upper body: No supraclavicular, axillary or pectoral adenopathy.  Neurological:     General: No focal deficit present.     Mental Status: She is alert and oriented to person, place, and time.  Psychiatric:        Mood and Affect: Mood normal.        Behavior: Behavior normal.     LABORATORY DATA:  I have reviewed the labs as listed.  CBC Latest Ref Rng & Units 09/08/2020 08/18/2020 07/28/2020  WBC 4.0 - 10.5 K/uL 8.0 6.6 5.6  Hemoglobin 12.0 - 15.0 g/dL 11.5(L) 11.6(L) 11.5(L)  Hematocrit 36 - 46 % 35.0(L) 35.3(L) 34.6(L)  Platelets 150 - 400 K/uL 361 392 358   CMP Latest Ref Rng & Units 09/08/2020 08/18/2020 07/28/2020  Glucose 70 - 99 mg/dL 104(H) 112(H) 79  BUN 8 - 23 mg/dL $Remove'8 11 11  'BwrrdWM$ Creatinine 0.44 - 1.00 mg/dL 0.72 0.63 0.51  Sodium 135 - 145 mmol/L 137 137 139  Potassium 3.5 - 5.1 mmol/L 3.6 3.3(L) 3.6  Chloride 98 - 111 mmol/L 105 105 106  CO2 22 - 32 mmol/L $RemoveB'26 25 26  'JKNmDsXm$ Calcium 8.9 - 10.3 mg/dL 9.2 9.0 9.2  Total Protein 6.5 - 8.1 g/dL 6.8 6.4(L) 6.1(L)  Total Bilirubin 0.3 - 1.2 mg/dL 0.2(L) 0.4 0.5  Alkaline Phos 38 - 126 U/L 72 68 77  AST 15 - 41 U/L 36 25 19  ALT 0 - 44 U/L $Remo'26 18 20    'ezZjH$ DIAGNOSTIC IMAGING:  I have independently reviewed the scans and discussed with the patient. No results found.   ASSESSMENT:  1. Stage II (T2N0) left breast TNBC: -Felt left breast mass for 5 months. -Mammogram on 05/07/2020 with suspicious mass in the 10:30 o'clock position in the left breast. Suspicious calcifications in the anterior lower inner quadrant of the left breast. -MRI on 06/05/2020 shows left breast mass measuring 3.8 x 3.3 x 3.2 cm in the upper inner quadrant. Biopsy-proven  high-grade DCIS in the anterior upper inner left breast. No suspicious lymphadenopathy. No MRI evidence of malignancy on the right. -Left breast upper inner quadrant biopsy on 05/14/2020 showed DCIS with calcifications. Right breast biopsy shows fibroadenomatoid nodule with calcifications. ER/PR was negative. -Left breast core needle biopsy at 10:30 position shows invasive ductal carcinoma. ER/PR/HER-2 was negative. Ki-67 85%. Left lymph node biopsy was reactive. -Patient was evaluated by Dr. [name] and Dr. Georgette Dover. -She received first cycle of chemotherapy with Adriamycin and cyclophosphamide with pembrolizumab ([id number] trial)added for improved PCR by about 15%. -Cycle 1 of Adriamycin, cyclophosphamide and pembrolizumab on9/10/2019.  2. Family history: -Paternal cousin and second cousin had breast cancer. Paternal grandmother also had some type of cancer. -Another paternal cousin died of pancreatic cancer. Another paternal cousin had leukemia.   PLAN:  1. Triple negative left breast cancer: -She has completed 4 cycles of AC with pembrolizumab. -She does not report any immunotherapy related side effects. -Breast exam today did not reveal any palpable masses or lymphadenopathy indicating a good response. -She will start weekly paclitaxel with carboplatin.  She will continue pembrolizumab once every 3 weeks for the next 12 weeks.  She will restart back on pembrolizumab after surgery for 9 treatments.  We talked about side effects of paclitaxel and carboplatin in detail. -We reviewed her labs today which are adequate to proceed with treatment. -She will be monitored every other week while on this treatment  plan.  2. Family history: -Genetic testing results are pending.  3.  Hypokalemia: -Potassium today is 3.6.  4.  High risk drug monitoring: -Her TSH today 71.65, up from 39 at last visit.  T3 and T4 levels were low. -We will start her on Synthroid 50 mcg daily.   Orders  placed this encounter:  No orders of the defined types were placed in this encounter.    Derek Jack, MD [place] [phone]   I, Milinda Antis, am acting as a scribe for Dr. Sanda Linger.  I, Derek Jack MD, have reviewed the above documentation for accuracy and completeness, and I agree with the above.

## 2020-09-08 NOTE — Progress Notes (Signed)
Patient was assessed by Dr. Katragadda and labs have been reviewed.  Patient is okay to proceed with treatment today. Primary RN and pharmacy aware.   

## 2020-09-08 NOTE — Patient Instructions (Signed)
[place] at [facility] Discharge Instructions  You were seen today by Dr. Delton Coombes. He went over your recent results. Your treatment will now consist of paclitaxel and carboplatin given weekly and Keytruda every 3 weeks. Dr. Delton Coombes will see you back in 2 weeks for labs and follow up.   Thank you for choosing [place] at [facility] to provide your oncology and hematology care.  To afford each patient quality time with our provider, please arrive at least 15 minutes before your scheduled appointment time.   If you have a lab appointment with the [facility] please come in thru the Main Entrance and check in at the main information desk  You need to re-schedule your appointment should you arrive 10 or more minutes late.  We strive to give you quality time with our providers, and arriving late affects you and other patients whose appointments are after yours.  Also, if you no show three or more times for appointments you may be dismissed from the clinic at the providers discretion.     Again, thank you for choosing [facility].  Our hope is that these requests will decrease the amount of time that you wait before being seen by our physicians.       _____________________________________________________________  Should you have questions after your visit to [facility], please contact our office at [phone] between the hours of 8:00 a.m. and 4:30 p.m.  Voicemails left after 4:00 p.m. will not be returned until the following business day.  For prescription refill requests, have your pharmacy contact our office and allow 72 hours.    Cancer Center Support Programs:   > Cancer Support Group  2nd Tuesday of the month 1pm-2pm, Journey Room

## 2020-09-08 NOTE — Progress Notes (Signed)
Continue with Emend 150 mg IVPB today per Dr Delton Coombes  T.O. Dr Rhys Martini, PharmD

## 2020-09-08 NOTE — Patient Instructions (Signed)
[facility] Discharge Instructions for Patients Receiving Chemotherapy  Today you received the following chemotherapy agents   To help prevent nausea and vomiting after your treatment, we encourage you to take your nausea medication   If you develop nausea and vomiting that is not controlled by your nausea medication, call the clinic.   BELOW ARE SYMPTOMS THAT SHOULD BE REPORTED IMMEDIATELY:  *FEVER GREATER THAN 100.5 F  *CHILLS WITH OR WITHOUT FEVER  NAUSEA AND VOMITING THAT IS NOT CONTROLLED WITH YOUR NAUSEA MEDICATION  *UNUSUAL SHORTNESS OF BREATH  *UNUSUAL BRUISING OR BLEEDING  TENDERNESS IN MOUTH AND THROAT WITH OR WITHOUT PRESENCE OF ULCERS  *URINARY PROBLEMS  *BOWEL PROBLEMS  UNUSUAL RASH Items with * indicate a potential emergency and should be followed up as soon as possible.  Feel free to call the clinic should you have any questions or concerns. The clinic phone number is [phone].  Please show the CHEMO ALERT CARD at check-in to the Emergency Department and triage nurse.   

## 2020-09-08 NOTE — Progress Notes (Signed)
Patient presents today for treatment, including first weekly Taxol.  Vital signs within parameters. No new complaints.  Labs within parameters for treatment.

## 2020-09-08 NOTE — Progress Notes (Signed)
Treatment given today per MD orders. Tolerated infusion without adverse affects. Vital signs stable. No complaints at this time. Discharged from clinic ambulatory in stable condition. Alert and oriented x 3. F/U with [facility] as scheduled.   

## 2020-09-09 LAB — T3: T3, Total: <20 ng/dL — ABNORMAL LOW (ref 71–180)

## 2020-09-14 ENCOUNTER — Inpatient Hospital Stay (HOSPITAL_COMMUNITY): Payer: BC Managed Care – PPO

## 2020-09-14 ENCOUNTER — Other Ambulatory Visit: Payer: Self-pay

## 2020-09-14 DIAGNOSIS — Z5112 Encounter for antineoplastic immunotherapy: Secondary | ICD-10-CM | POA: Diagnosis not present

## 2020-09-14 DIAGNOSIS — C50812 Malignant neoplasm of overlapping sites of left female breast: Secondary | ICD-10-CM

## 2020-09-14 DIAGNOSIS — Z171 Estrogen receptor negative status [ER-]: Secondary | ICD-10-CM

## 2020-09-14 LAB — COMPREHENSIVE METABOLIC PANEL
ALT: 29 U/L (ref 0–44)
AST: 41 U/L (ref 15–41)
Albumin: 4.1 g/dL (ref 3.5–5.0)
Alkaline Phosphatase: 60 U/L (ref 38–126)
Anion gap: 7 (ref 5–15)
BUN: 12 mg/dL (ref 8–23)
CO2: 29 mmol/L (ref 22–32)
Calcium: 9.2 mg/dL (ref 8.9–10.3)
Chloride: 100 mmol/L (ref 98–111)
Creatinine, Ser: 0.79 mg/dL (ref 0.44–1.00)
GFR, Estimated: >60 mL/min (ref >60)
Glucose, Bld: 82 mg/dL (ref 70–99)
Potassium: 3.6 mmol/L (ref 3.5–5.1)
Sodium: 136 mmol/L (ref 135–145)
Total Bilirubin: 0.6 mg/dL (ref 0.3–1.2)
Total Protein: 6.4 g/dL — ABNORMAL LOW (ref 6.5–8.1)

## 2020-09-14 LAB — CBC WITH DIFFERENTIAL/PLATELET
Abs Immature Granulocytes: 0.03 10*3/uL (ref 0.00–0.07)
Basophils Absolute: 0.1 10*3/uL (ref 0.0–0.1)
Basophils Relative: 2 %
Eosinophils Absolute: 0 10*3/uL (ref 0.0–0.5)
Eosinophils Relative: 1 %
HCT: 32.2 % — ABNORMAL LOW (ref 36.0–46.0)
Hemoglobin: 10.6 g/dL — ABNORMAL LOW (ref 12.0–15.0)
Immature Granulocytes: 1 %
Lymphocytes Relative: 21 %
Lymphs Abs: 1.1 10*3/uL (ref 0.7–4.0)
MCH: 34.5 pg — ABNORMAL HIGH (ref 26.0–34.0)
MCHC: 32.9 g/dL (ref 30.0–36.0)
MCV: 104.9 fL — ABNORMAL HIGH (ref 80.0–100.0)
Monocytes Absolute: 0.4 10*3/uL (ref 0.1–1.0)
Monocytes Relative: 8 %
Neutro Abs: 3.5 10*3/uL (ref 1.7–7.7)
Neutrophils Relative %: 67 %
Platelets: 313 10*3/uL (ref 150–400)
RBC: 3.07 MIL/uL — ABNORMAL LOW (ref 3.87–5.11)
RDW: 19.2 % — ABNORMAL HIGH (ref 11.5–15.5)
WBC: 5.1 10*3/uL (ref 4.0–10.5)
nRBC: 0 % (ref 0.0–0.2)

## 2020-09-14 LAB — TSH: TSH: 71.744 u[IU]/mL — ABNORMAL HIGH (ref 0.350–4.500)

## 2020-09-14 NOTE — Progress Notes (Signed)
24 hour call back. Unable to reach patient. Left message on mobile number.

## 2020-09-15 ENCOUNTER — Other Ambulatory Visit (HOSPITAL_COMMUNITY): Payer: BC Managed Care – PPO

## 2020-09-15 ENCOUNTER — Inpatient Hospital Stay (HOSPITAL_COMMUNITY): Payer: BC Managed Care – PPO | Attending: Hematology

## 2020-09-15 ENCOUNTER — Other Ambulatory Visit: Payer: Self-pay

## 2020-09-15 VITALS — BP 124/77 | HR 76 | Temp 97.0°F | Resp 18 | Wt 160.0 lb

## 2020-09-15 DIAGNOSIS — Z8379 Family history of other diseases of the digestive system: Secondary | ICD-10-CM | POA: Diagnosis not present

## 2020-09-15 DIAGNOSIS — Z800 Family history of malignant neoplasm of digestive organs: Secondary | ICD-10-CM | POA: Insufficient documentation

## 2020-09-15 DIAGNOSIS — B373 Candidiasis of vulva and vagina: Secondary | ICD-10-CM | POA: Diagnosis not present

## 2020-09-15 DIAGNOSIS — B353 Tinea pedis: Secondary | ICD-10-CM | POA: Diagnosis not present

## 2020-09-15 DIAGNOSIS — D6481 Anemia due to antineoplastic chemotherapy: Secondary | ICD-10-CM | POA: Diagnosis not present

## 2020-09-15 DIAGNOSIS — E038 Other specified hypothyroidism: Secondary | ICD-10-CM | POA: Insufficient documentation

## 2020-09-15 DIAGNOSIS — C50212 Malignant neoplasm of upper-inner quadrant of left female breast: Secondary | ICD-10-CM | POA: Diagnosis not present

## 2020-09-15 DIAGNOSIS — T451X5A Adverse effect of antineoplastic and immunosuppressive drugs, initial encounter: Secondary | ICD-10-CM | POA: Insufficient documentation

## 2020-09-15 DIAGNOSIS — C773 Secondary and unspecified malignant neoplasm of axilla and upper limb lymph nodes: Secondary | ICD-10-CM | POA: Diagnosis not present

## 2020-09-15 DIAGNOSIS — Z171 Estrogen receptor negative status [ER-]: Secondary | ICD-10-CM | POA: Insufficient documentation

## 2020-09-15 DIAGNOSIS — Z79899 Other long term (current) drug therapy: Secondary | ICD-10-CM | POA: Insufficient documentation

## 2020-09-15 DIAGNOSIS — Z5111 Encounter for antineoplastic chemotherapy: Secondary | ICD-10-CM | POA: Insufficient documentation

## 2020-09-15 DIAGNOSIS — C50812 Malignant neoplasm of overlapping sites of left female breast: Secondary | ICD-10-CM

## 2020-09-15 DIAGNOSIS — Z8249 Family history of ischemic heart disease and other diseases of the circulatory system: Secondary | ICD-10-CM | POA: Insufficient documentation

## 2020-09-15 DIAGNOSIS — Z803 Family history of malignant neoplasm of breast: Secondary | ICD-10-CM | POA: Insufficient documentation

## 2020-09-15 DIAGNOSIS — Z880 Allergy status to penicillin: Secondary | ICD-10-CM | POA: Insufficient documentation

## 2020-09-15 MED ORDER — PALONOSETRON HCL INJECTION 0.25 MG/5ML
0.2500 mg | Freq: Once | INTRAVENOUS | Status: AC
  Administered 2020-09-15: 0.25 mg via INTRAVENOUS
  Filled 2020-09-15: qty 5

## 2020-09-15 MED ORDER — SODIUM CHLORIDE 0.9% FLUSH
10.0000 mL | INTRAVENOUS | Status: DC | PRN
  Administered 2020-09-15: 10 mL

## 2020-09-15 MED ORDER — DIPHENHYDRAMINE HCL 50 MG/ML IJ SOLN
50.0000 mg | Freq: Once | INTRAMUSCULAR | Status: AC
  Administered 2020-09-15: 50 mg via INTRAVENOUS
  Filled 2020-09-15: qty 1

## 2020-09-15 MED ORDER — SODIUM CHLORIDE 0.9 % IV SOLN
134.8500 mg | Freq: Once | INTRAVENOUS | Status: AC
  Administered 2020-09-15: 130 mg via INTRAVENOUS
  Filled 2020-09-15: qty 13

## 2020-09-15 MED ORDER — HEPARIN SOD (PORK) LOCK FLUSH 100 UNIT/ML IV SOLN
500.0000 [IU] | Freq: Once | INTRAVENOUS | Status: AC | PRN
  Administered 2020-09-15: 500 [IU]

## 2020-09-15 MED ORDER — FAMOTIDINE IN NACL 20-0.9 MG/50ML-% IV SOLN
20.0000 mg | Freq: Once | INTRAVENOUS | Status: AC
  Administered 2020-09-15: 20 mg via INTRAVENOUS
  Filled 2020-09-15: qty 50

## 2020-09-15 MED ORDER — SODIUM CHLORIDE 0.9 % IV SOLN
20.0000 mg | Freq: Once | INTRAVENOUS | Status: AC
  Administered 2020-09-15: 20 mg via INTRAVENOUS
  Filled 2020-09-15: qty 20

## 2020-09-15 MED ORDER — SODIUM CHLORIDE 0.9 % IV SOLN
80.0000 mg/m2 | Freq: Once | INTRAVENOUS | Status: AC
  Administered 2020-09-15: 150 mg via INTRAVENOUS
  Filled 2020-09-15: qty 25

## 2020-09-15 MED ORDER — SODIUM CHLORIDE 0.9 % IV SOLN: Freq: Once | INTRAVENOUS | Status: AC

## 2020-09-15 NOTE — Progress Notes (Signed)
Patient presents today for treatment.  Vital signs stable.  No new complaints since last visit.

## 2020-09-15 NOTE — Patient Instructions (Signed)
[facility] Discharge Instructions for Patients Receiving Chemotherapy  Today you received the following chemotherapy agents   To help prevent nausea and vomiting after your treatment, we encourage you to take your nausea medication   If you develop nausea and vomiting that is not controlled by your nausea medication, call the clinic.   BELOW ARE SYMPTOMS THAT SHOULD BE REPORTED IMMEDIATELY:  *FEVER GREATER THAN 100.5 F  *CHILLS WITH OR WITHOUT FEVER  NAUSEA AND VOMITING THAT IS NOT CONTROLLED WITH YOUR NAUSEA MEDICATION  *UNUSUAL SHORTNESS OF BREATH  *UNUSUAL BRUISING OR BLEEDING  TENDERNESS IN MOUTH AND THROAT WITH OR WITHOUT PRESENCE OF ULCERS  *URINARY PROBLEMS  *BOWEL PROBLEMS  UNUSUAL RASH Items with * indicate a potential emergency and should be followed up as soon as possible.  Feel free to call the clinic should you have any questions or concerns. The clinic phone number is [phone].  Please show the CHEMO ALERT CARD at check-in to the Emergency Department and triage nurse.   

## 2020-09-15 NOTE — Progress Notes (Signed)
Continue with Aloxi 0.25 mg IVPush as premedication.  Do not continue Emend at this time.  V.O. Dr Rhys Martini, PharmD

## 2020-09-15 NOTE — Progress Notes (Signed)
Treatment given today per MD orders. Tolerated infusion without adverse affects. Vital signs stable. No complaints at this time. Discharged from clinic ambulatory in stable condition. Alert and oriented x 3. F/U with [facility] as scheduled.   

## 2020-09-20 ENCOUNTER — Other Ambulatory Visit (HOSPITAL_COMMUNITY): Payer: Self-pay

## 2020-09-20 MED ORDER — MAGIC MOUTHWASH: 5.0000 mL | Freq: Three times a day (TID) | ORAL | 0 refills | Status: DC | PRN

## 2020-09-21 ENCOUNTER — Inpatient Hospital Stay (HOSPITAL_COMMUNITY): Payer: BC Managed Care – PPO | Attending: Hematology and Oncology

## 2020-09-21 ENCOUNTER — Other Ambulatory Visit: Payer: Self-pay

## 2020-09-21 DIAGNOSIS — Z803 Family history of malignant neoplasm of breast: Secondary | ICD-10-CM | POA: Diagnosis not present

## 2020-09-21 DIAGNOSIS — Z8379 Family history of other diseases of the digestive system: Secondary | ICD-10-CM | POA: Insufficient documentation

## 2020-09-21 DIAGNOSIS — R5383 Other fatigue: Secondary | ICD-10-CM | POA: Insufficient documentation

## 2020-09-21 DIAGNOSIS — Z171 Estrogen receptor negative status [ER-]: Secondary | ICD-10-CM

## 2020-09-21 DIAGNOSIS — R21 Rash and other nonspecific skin eruption: Secondary | ICD-10-CM | POA: Diagnosis not present

## 2020-09-21 DIAGNOSIS — Z79899 Other long term (current) drug therapy: Secondary | ICD-10-CM | POA: Diagnosis not present

## 2020-09-21 DIAGNOSIS — Z8249 Family history of ischemic heart disease and other diseases of the circulatory system: Secondary | ICD-10-CM | POA: Insufficient documentation

## 2020-09-21 DIAGNOSIS — Z170 Estrogen receptor positive status [ER+]: Secondary | ICD-10-CM | POA: Diagnosis not present

## 2020-09-21 DIAGNOSIS — Z800 Family history of malignant neoplasm of digestive organs: Secondary | ICD-10-CM | POA: Diagnosis not present

## 2020-09-21 DIAGNOSIS — Z809 Family history of malignant neoplasm, unspecified: Secondary | ICD-10-CM | POA: Diagnosis not present

## 2020-09-21 DIAGNOSIS — C50812 Malignant neoplasm of overlapping sites of left female breast: Secondary | ICD-10-CM

## 2020-09-21 DIAGNOSIS — K1379 Other lesions of oral mucosa: Secondary | ICD-10-CM | POA: Diagnosis not present

## 2020-09-21 LAB — CBC WITH DIFFERENTIAL/PLATELET
Abs Immature Granulocytes: 0.02 10*3/uL (ref 0.00–0.07)
Basophils Absolute: 0.1 10*3/uL (ref 0.0–0.1)
Basophils Relative: 2 %
Eosinophils Absolute: 0.2 10*3/uL (ref 0.0–0.5)
Eosinophils Relative: 3 %
HCT: 31.1 % — ABNORMAL LOW (ref 36.0–46.0)
Hemoglobin: 10.3 g/dL — ABNORMAL LOW (ref 12.0–15.0)
Immature Granulocytes: 0 %
Lymphocytes Relative: 24 %
Lymphs Abs: 1.4 10*3/uL (ref 0.7–4.0)
MCH: 35.4 pg — ABNORMAL HIGH (ref 26.0–34.0)
MCHC: 33.1 g/dL (ref 30.0–36.0)
MCV: 106.9 fL — ABNORMAL HIGH (ref 80.0–100.0)
Monocytes Absolute: 0.4 10*3/uL (ref 0.1–1.0)
Monocytes Relative: 6 %
Neutro Abs: 3.8 10*3/uL (ref 1.7–7.7)
Neutrophils Relative %: 65 %
Platelets: 215 10*3/uL (ref 150–400)
RBC: 2.91 MIL/uL — ABNORMAL LOW (ref 3.87–5.11)
RDW: 19.1 % — ABNORMAL HIGH (ref 11.5–15.5)
WBC: 5.8 10*3/uL (ref 4.0–10.5)
nRBC: 0 % (ref 0.0–0.2)

## 2020-09-21 LAB — COMPREHENSIVE METABOLIC PANEL
ALT: 30 U/L (ref 0–44)
AST: 45 U/L — ABNORMAL HIGH (ref 15–41)
Albumin: 4.1 g/dL (ref 3.5–5.0)
Alkaline Phosphatase: 63 U/L (ref 38–126)
Anion gap: 7 (ref 5–15)
BUN: 18 mg/dL (ref 8–23)
CO2: 26 mmol/L (ref 22–32)
Calcium: 9.5 mg/dL (ref 8.9–10.3)
Chloride: 102 mmol/L (ref 98–111)
Creatinine, Ser: 0.71 mg/dL (ref 0.44–1.00)
GFR, Estimated: >60 mL/min (ref >60)
Glucose, Bld: 99 mg/dL (ref 70–99)
Potassium: 4 mmol/L (ref 3.5–5.1)
Sodium: 135 mmol/L (ref 135–145)
Total Bilirubin: 0.4 mg/dL (ref 0.3–1.2)
Total Protein: 6.5 g/dL (ref 6.5–8.1)

## 2020-09-21 LAB — TSH: TSH: 65.026 u[IU]/mL — ABNORMAL HIGH (ref 0.350–4.500)

## 2020-09-22 ENCOUNTER — Encounter: Payer: Self-pay | Admitting: Licensed Clinical Social Worker

## 2020-09-22 ENCOUNTER — Ambulatory Visit: Payer: Self-pay | Admitting: Licensed Clinical Social Worker

## 2020-09-22 ENCOUNTER — Other Ambulatory Visit: Payer: Self-pay

## 2020-09-22 ENCOUNTER — Other Ambulatory Visit (HOSPITAL_COMMUNITY): Payer: BC Managed Care – PPO

## 2020-09-22 ENCOUNTER — Telehealth: Payer: Self-pay | Admitting: Licensed Clinical Social Worker

## 2020-09-22 ENCOUNTER — Inpatient Hospital Stay (HOSPITAL_BASED_OUTPATIENT_CLINIC_OR_DEPARTMENT_OTHER): Payer: BC Managed Care – PPO | Admitting: Hematology and Oncology

## 2020-09-22 ENCOUNTER — Inpatient Hospital Stay (HOSPITAL_COMMUNITY): Payer: BC Managed Care – PPO

## 2020-09-22 VITALS — BP 117/73 | HR 68 | Temp 97.2°F | Resp 18 | Wt 158.0 lb

## 2020-09-22 DIAGNOSIS — Z1379 Encounter for other screening for genetic and chromosomal anomalies: Secondary | ICD-10-CM | POA: Insufficient documentation

## 2020-09-22 DIAGNOSIS — C50812 Malignant neoplasm of overlapping sites of left female breast: Secondary | ICD-10-CM | POA: Diagnosis not present

## 2020-09-22 DIAGNOSIS — C50212 Malignant neoplasm of upper-inner quadrant of left female breast: Secondary | ICD-10-CM | POA: Diagnosis not present

## 2020-09-22 DIAGNOSIS — Z171 Estrogen receptor negative status [ER-]: Secondary | ICD-10-CM

## 2020-09-22 DIAGNOSIS — Z800 Family history of malignant neoplasm of digestive organs: Secondary | ICD-10-CM

## 2020-09-22 DIAGNOSIS — Z803 Family history of malignant neoplasm of breast: Secondary | ICD-10-CM

## 2020-09-22 MED ORDER — FAMOTIDINE IN NACL 20-0.9 MG/50ML-% IV SOLN
20.0000 mg | Freq: Once | INTRAVENOUS | Status: AC
  Administered 2020-09-22: 20 mg via INTRAVENOUS

## 2020-09-22 MED ORDER — SODIUM CHLORIDE 0.9 % IV SOLN: Freq: Once | INTRAVENOUS | Status: AC

## 2020-09-22 MED ORDER — DIPHENHYDRAMINE HCL 50 MG/ML IJ SOLN
50.0000 mg | Freq: Once | INTRAMUSCULAR | Status: AC
  Administered 2020-09-22: 50 mg via INTRAVENOUS

## 2020-09-22 MED ORDER — HEPARIN SOD (PORK) LOCK FLUSH 100 UNIT/ML IV SOLN
500.0000 [IU] | Freq: Once | INTRAVENOUS | Status: AC | PRN
  Administered 2020-09-22: 500 [IU]

## 2020-09-22 MED ORDER — DIPHENHYDRAMINE HCL 50 MG/ML IJ SOLN
INTRAMUSCULAR | Status: AC
  Filled 2020-09-22: qty 1

## 2020-09-22 MED ORDER — SODIUM CHLORIDE 0.9 % IV SOLN
20.0000 mg | Freq: Once | INTRAVENOUS | Status: AC
  Administered 2020-09-22: 20 mg via INTRAVENOUS
  Filled 2020-09-22: qty 20

## 2020-09-22 MED ORDER — FAMOTIDINE IN NACL 20-0.9 MG/50ML-% IV SOLN
INTRAVENOUS | Status: AC
  Filled 2020-09-22: qty 50

## 2020-09-22 MED ORDER — PALONOSETRON HCL INJECTION 0.25 MG/5ML
0.2500 mg | Freq: Once | INTRAVENOUS | Status: AC
  Administered 2020-09-22: 0.25 mg via INTRAVENOUS

## 2020-09-22 MED ORDER — SODIUM CHLORIDE 0.9 % IV SOLN
80.0000 mg/m2 | Freq: Once | INTRAVENOUS | Status: AC
  Administered 2020-09-22: 150 mg via INTRAVENOUS
  Filled 2020-09-22: qty 25

## 2020-09-22 MED ORDER — SODIUM CHLORIDE 0.9% FLUSH: 10.0000 mL | INTRAVENOUS | Status: DC | PRN

## 2020-09-22 MED ORDER — SODIUM CHLORIDE 0.9 % IV SOLN
134.8500 mg | Freq: Once | INTRAVENOUS | Status: AC
  Administered 2020-09-22: 130 mg via INTRAVENOUS
  Filled 2020-09-22: qty 13

## 2020-09-22 MED ORDER — KETOCONAZOLE 2 % EX CREA: 1.0000 "application " | TOPICAL_CREAM | Freq: Every day | CUTANEOUS | 1 refills | Status: DC

## 2020-09-22 MED ORDER — FLUCONAZOLE 100 MG PO TABS: 100.0000 mg | ORAL_TABLET | Freq: Every day | ORAL | 0 refills | Status: DC

## 2020-09-22 MED ORDER — PALONOSETRON HCL INJECTION 0.25 MG/5ML
INTRAVENOUS | Status: AC
  Filled 2020-09-22: qty 5

## 2020-09-22 NOTE — Progress Notes (Signed)
Patient Care Team: Roderic Scarce, MD as PCP - General (Nurse Practitioner) Rockwell Germany, RN as Oncology Nurse Navigator Mauro Kaufmann, RN as Oncology Nurse Navigator Dishmon, Garwin Brothers, RN as Oncology Nurse Navigator (Oncology)  DIAGNOSIS:  Encounter Diagnosis  Name Primary?  . Malignant neoplasm of overlapping sites of left breast in female, estrogen receptor negative ([place])     SUMMARY OF ONCOLOGIC HISTORY: Oncology History  Malignant neoplasm of overlapping sites of left breast in female, estrogen receptor negative ([place])  05/17/2020 Cancer Staging   Staging form: Breast, AJCC 8th Edition - Clinical stage from 05/17/2020: Stage IIB (cT2, cN0, cM0, G3, ER-, PR-, HER2-) - Signed by Gardenia Phlegm, NP on 05/26/2020   05/26/2020 Initial Diagnosis   Patient palpated a left breast mass for 5 months. Mammogram and US showed a 2.7cm mass at the 10:30 position, one left axillary lymph node with cortical thickening, calcifications in the lower inner left breast, and a 0.4cm group of calcifications in the right breast. Biopsy on 05/14/20 showed no malignancy in the right breast, and in the left breast, DCIS, high grade, ER/PR negative. 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    06/16/2020 -  Chemotherapy   The patient had dexamethasone (DECADRON) 4 MG tablet, 1 of 1 cycle, Start date: 06/08/2020, End date: -- DOXOrubicin (ADRIAMYCIN) chemo injection 112 mg, 60 mg/m2 = 112 mg, Intravenous,  Once, 4 of 4 cycles Administration: 112 mg (06/16/2020), 112 mg (07/07/2020), 112 mg (07/28/2020), 112 mg (08/18/2020) palonosetron (ALOXI) injection 0.25 mg, 0.25 mg, Intravenous,  Once, 5 of 8 cycles Administration: 0.25 mg (06/16/2020), 0.25 mg (09/08/2020), 0.25 mg (07/07/2020), 0.25 mg (07/28/2020), 0.25 mg (08/18/2020), 0.25 mg (09/15/2020) pegfilgrastim-bmez  (ZIEXTENZO) injection 6 mg, 6 mg, Subcutaneous,  Once, 4 of 4 cycles Administration: 6 mg (06/19/2020), 6 mg (07/09/2020), 6 mg (07/29/2020), 6 mg (08/19/2020) CARBOplatin (PARAPLATIN) 130 mg in sodium chloride 0.9 % 100 mL chemo infusion, 130 mg (100 % of original dose 134.85 mg), Intravenous,  Once, 1 of 4 cycles Dose modification:   (original dose 134.85 mg, Cycle 5) Administration: 130 mg (09/08/2020), 130 mg (09/15/2020) cyclophosphamide (CYTOXAN) 1,120 mg in sodium chloride 0.9 % 250 mL chemo infusion, 600 mg/m2 = 1,120 mg, Intravenous,  Once, 4 of 4 cycles Administration: 1,120 mg (06/16/2020), 1,120 mg (07/07/2020), 1,120 mg (07/28/2020), 1,120 mg (08/18/2020) PACLitaxel (TAXOL) 150 mg in sodium chloride 0.9 % 250 mL chemo infusion (</= 25m/m2), 80 mg/m2 = 150 mg, Intravenous,  Once, 1 of 4 cycles Administration: 150 mg (09/08/2020), 150 mg (09/15/2020) fosaprepitant (EMEND) 150 mg in sodium chloride 0.9 % 145 mL IVPB, 150 mg, Intravenous,  Once, 5 of 8 cycles Administration: 150 mg (06/16/2020), 150 mg (09/08/2020), 150 mg (07/07/2020), 150 mg (07/28/2020), 150 mg (08/18/2020) pembrolizumab (KEYTRUDA) 200 mg in sodium chloride 0.9 % 50 mL chemo infusion, 150 mg, Intravenous, Once, 5 of 8 cycles Dose modification: 200 mg (original dose 2 mg/kg, Cycle 2, Reason: Provider Judgment, Comment: flat dose per manufacturer) Administration: 200 mg (06/16/2020), 200 mg (07/07/2020), 200 mg (09/08/2020), 200 mg (07/28/2020), 200 mg (08/18/2020)  for chemotherapy treatment.    09/22/2020 Genetic Testing   Negative genetic testing. No pathogenic variants identified on the [facility] Common Hereditary Cancers Panel. The report date is 09/22/2020.  The Common Hereditary Cancers Panel offered by [facility] includes sequencing and/or deletion duplication testing  of the following 48 genes: APC, ATM, AXIN2, BARD1, BMPR1A, BRCA1, BRCA2, BRIP1, CDH1, CDKN2A (p14ARF), CDKN2A (p16INK4a), CKD4, CHEK2, CTNNA1, DICER1, EPCAM  (Deletion/duplication testing only), GREM1 (promoter region deletion/duplication testing only), KIT, MEN1, MLH1, MSH2, MSH3, MSH6, MUTYH, NBN, NF1, NHTL1, PALB2, PDGFRA, PMS2, POLD1, POLE, PTEN, RAD50, RAD51C, RAD51D, RNF43, SDHB, SDHC, SDHD, SMAD4, SMARCA4. STK11, TP53, TSC1, TSC2, and VHL.  The following genes were evaluated for sequence changes only: SDHA and HOXB13 c.251G>A variant only.     CHIEF COMPLIANT: Cycle 3 Taxol  INTERVAL HISTORY: Jo Mills is a 69 year old above-mentioned surgical negative breast cancer currently on neoadjuvant chemotherapy with Taxol and carboplatin with Keytruda.  Today is cycle 3 of Taxol.  Her major complaints are related to toenail fungus and oral thrush.  She denies any fevers or chills.  Does have mild to moderate fatigue but she is able to walk every day for several months.  Her worst day is usually the third or fourth day after chemo and she feels very tired.  She still working full-time.  Denies any nausea vomiting.  Denies neuropathy.  She has a maculopapular rash on her arms on both sides   ALLERGIES:  is allergic to grapefruit extract, pseudoephedrine hcl, nickel, pineapple, and amoxicillin.  MEDICATIONS:  Current Outpatient Medications  Medication Sig Dispense Refill  . amLODipine-benazepril (LOTREL) 5-20 MG capsule Take 1 capsule by mouth daily.    . Bismuth Subsalicylate (PEPTO-BISMOL PO) Take by mouth as needed.    Marland Kitchen dexamethasone (DECADRON) 4 MG tablet Take 1 tablet day after chemo and 1 tablet 2 days after chemo with food 16 tablet 1  . fexofenadine (ALLEGRA) 180 MG tablet Take 180 mg by mouth daily.    . fluconazole (DIFLUCAN) 100 MG tablet Take 1 tablet (100 mg total) by mouth daily. 7 tablet 0  . ketoconazole (NIZORAL) 2 % cream Apply 1 application topically daily. 30 g 1  . Lactobacillus-Inulin (CULTURELLE DIGESTIVE DAILY) CAPS Take 1 capsule by mouth daily.    Marland Kitchen levothyroxine (SYNTHROID) 50 MCG tablet Take 1 tablet (50 mcg total) by  mouth daily before breakfast. 30 tablet 0  . lidocaine-prilocaine (EMLA) cream Apply to affected area once 30 g 3  . LORazepam (ATIVAN) 0.5 MG tablet Take 1 tablet (0.5 mg total) by mouth at bedtime as needed for sleep. 30 tablet 0  . magic mouthwash SOLN Take 5 mLs by mouth 3 (three) times daily as needed for mouth pain. 450 mL 0  . omeprazole (PRILOSEC) 20 MG capsule Take 20 mg by mouth daily.    . ondansetron (ZOFRAN) 8 MG tablet Take 1 tablet (8 mg total) by mouth 2 (two) times daily as needed. Start on the third day after chemotherapy. 30 tablet 1  . prochlorperazine (COMPAZINE) 10 MG tablet Take 1 tablet (10 mg total) by mouth every 6 (six) hours as needed (Nausea or vomiting). 30 tablet 1   No current facility-administered medications for this visit.   Facility-Administered Medications Ordered in Other Visits  Medication Dose Route Frequency Provider Last Rate Last Admin  . CARBOplatin (PARAPLATIN) 130 mg in sodium chloride 0.9 % 100 mL chemo infusion  130 mg Intravenous Once Derek Jack, MD      . heparin lock flush 100 unit/mL  500 Units Intracatheter Once PRN Derek Jack, MD      . PACLitaxel (TAXOL) 150 mg in sodium chloride 0.9 % 250 mL chemo infusion (</= 7m/m2)  80 mg/m2 (Treatment Plan Recorded) Intravenous Once KDerek Jack MD      .  sodium chloride flush (NS) 0.9 % injection 10 mL  10 mL Intracatheter PRN Derek Jack, MD        PHYSICAL EXAMINATION: ECOG PERFORMANCE STATUS: 1 - Symptomatic but completely ambulatory  There were no vitals filed for this visit. There were no vitals filed for this visit.     LABORATORY DATA:  I have reviewed the data as listed CMP Latest Ref Rng & Units 09/21/2020 09/14/2020 09/08/2020  Glucose 70 - 99 mg/dL 99 82 104(H)  BUN 8 - 23 mg/dL _0 Creatinine 0.44 - 1.00 mg/dL 0.71 0.79 0.72  Sodium 135 - 145 mmol/L 135 136 137  Potassium 3.5 - 5.1 mmol/L 4.0 3.6 3.6  Chloride 98 - 111 mmol/L 102 100  105  CO2 22 - 32 mmol/L _1 Calcium 8.9 - 10.3 mg/dL 9.5 9.2 9.2  Total Protein 6.5 - 8.1 g/dL 6.5 6.4(L) 6.8  Total Bilirubin 0.3 - 1.2 mg/dL 0.4 0.6 0.2(L)  Alkaline Phos 38 - 126 U/L 63 60 72  AST 15 - 41 U/L 45(H) 41 36  ALT 0 - 44 U/L _2 Lab Results  Component Value Date   WBC 5.8 09/21/2020   HGB 10.3 (L) 09/21/2020   HCT 31.1 (L) 09/21/2020   MCV 106.9 (H) 09/21/2020   PLT 215 09/21/2020   NEUTROABS 3.8 09/21/2020    ASSESSMENT & PLAN:  Malignant neoplasm of overlapping sites of left breast in female, estrogen receptor negative ([place]) 05/26/2020:Patient palpated a left breast mass for 5 months. Mammogram and US showed a 2.7cm mass at the 10:30 position, one left axillary lymph node with cortical thickening, calcifications in the lower inner left breast, and a 0.4cm group of calcifications in the right breast. Biopsy on 05/14/20 showed no malignancy in the right breast, and in the left breast, DCIS, high grade, ER/PR negative. 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   Treatment plan: 1.  Neoadjuvant chemotherapy with Adriamycin Cytoxan Keytruda every 3 weeks x4 followed by Taxol Doristine Church followed by Beryle Flock maintenance 2. Breast conserving surgery with targeted node dissection 2. Adjuvant radiation therapy --------------------------------------------------------------------------------------------------------------------------------------------- Current treatment: Completed 4 cycles of Adriamycin, Cytoxan, Keytruda, currently receiving Taxol carboplatin cycle 3 today Labs have been reviewed  Chemo toxicities: 1.  Maculopapular rash and extremities: It was obvious hydrocortisone cream. 2. oral thrush: I sent a prescription for Diflucan 3.  Nail fungus: Ketoconazole cream 4.  Chemo induced anemia Monitoring closely  for toxicities I would like TSH levels to be checked with each treatment.  Return to clinic weekly for Taxol treatments.     No orders of the defined types were placed in this encounter.  The patient has a good understanding of the overall plan. she agrees with it. she will call with any problems that may develop before the next visit here. Total time spent: 30 mins including face to face time and time spent for planning, charting and co-ordination of care   Harriette Ohara, MD 09/22/20

## 2020-09-22 NOTE — Progress Notes (Signed)
Patient seen by Dr. [name] pertaining to complaints. Labs reviewed by MD. Verbal order received to proceed with today's treatment.

## 2020-09-22 NOTE — Progress Notes (Signed)
Patient tolerated chemotherapy with no complaints voiced.  Side effects with management reviewed with understanding verbalized.  Port site clean and dry with no bruising or swelling noted at site.  Good blood return noted before and after administration of chemotherapy.  Band aid applied.  Patient left in satisfactory condition with VSS and no s/s of distress noted.   

## 2020-09-22 NOTE — Progress Notes (Signed)
Patient presented for treatment today see RN assessment. Dr. [name] was in room prior to my rooming assessment.

## 2020-09-22 NOTE — Telephone Encounter (Signed)
Revealed negative genetic testing. This normal result is reassuring and indicates that it is unlikely Jo Mills's cancer is due to a hereditary cause.  It is unlikely that there is an increased risk of another cancer due to a mutation in one of these genes.  However, genetic testing is not perfect, and cannot definitively rule out a hereditary cause.  It will be important for her to keep in contact with genetics to learn if any additional testing may be needed in the future.

## 2020-09-22 NOTE — Progress Notes (Signed)
HPI:  Ms. Zemaitis was previously seen in the [facility] due to a personal and family history of cancer and concerns regarding a hereditary predisposition to cancer. Please refer to our prior cancer genetics clinic note for more information regarding our discussion, assessment and recommendations, at the time. Ms. Cwynar recent genetic test results were disclosed to her, as were recommendations warranted by these results. These results and recommendations are discussed in more detail below.  CANCER HISTORY:  Oncology History  Malignant neoplasm of overlapping sites of left breast in female, estrogen receptor negative ([place])  05/17/2020 Cancer Staging   Staging form: Breast, AJCC 8th Edition - Clinical stage from 05/17/2020: Stage IIB (cT2, cN0, cM0, G3, ER-, PR-, HER2-) - Signed by Gardenia Phlegm, NP on 05/26/2020   05/26/2020 Initial Diagnosis   Patient palpated a left breast mass for 5 months. Mammogram and US showed a 2.7cm mass at the 10:30 position, one left axillary lymph node with cortical thickening, calcifications in the lower inner left breast, and a 0.4cm group of calcifications in the right breast. Biopsy on 05/14/20 showed no malignancy in the right breast, and in the left breast, DCIS, high grade, ER/PR negative. 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    06/16/2020 -  Chemotherapy   The patient had dexamethasone (DECADRON) 4 MG tablet, 1 of 1 cycle, Start date: 06/08/2020, End date: -- DOXOrubicin (ADRIAMYCIN) chemo injection 112 mg, 60 mg/m2 = 112 mg, Intravenous,  Once, 4 of 4 cycles Administration: 112 mg (06/16/2020), 112 mg (07/07/2020), 112 mg (07/28/2020), 112 mg (08/18/2020) palonosetron (ALOXI) injection 0.25 mg, 0.25 mg, Intravenous,  Once, 5 of 8 cycles Administration: 0.25 mg (06/16/2020), 0.25 mg (09/08/2020), 0.25  mg (07/07/2020), 0.25 mg (07/28/2020), 0.25 mg (08/18/2020), 0.25 mg (09/15/2020) pegfilgrastim-bmez (ZIEXTENZO) injection 6 mg, 6 mg, Subcutaneous,  Once, 4 of 4 cycles Administration: 6 mg (06/19/2020), 6 mg (07/09/2020), 6 mg (07/29/2020), 6 mg (08/19/2020) CARBOplatin (PARAPLATIN) 130 mg in sodium chloride 0.9 % 100 mL chemo infusion, 130 mg (100 % of original dose 134.85 mg), Intravenous,  Once, 1 of 4 cycles Dose modification:   (original dose 134.85 mg, Cycle 5) Administration: 130 mg (09/08/2020), 130 mg (09/15/2020) cyclophosphamide (CYTOXAN) 1,120 mg in sodium chloride 0.9 % 250 mL chemo infusion, 600 mg/m2 = 1,120 mg, Intravenous,  Once, 4 of 4 cycles Administration: 1,120 mg (06/16/2020), 1,120 mg (07/07/2020), 1,120 mg (07/28/2020), 1,120 mg (08/18/2020) PACLitaxel (TAXOL) 150 mg in sodium chloride 0.9 % 250 mL chemo infusion (</= $RemoveBefor'80mg'KIIjFnQVqHDf$ /m2), 80 mg/m2 = 150 mg, Intravenous,  Once, 1 of 4 cycles Administration: 150 mg (09/08/2020), 150 mg (09/15/2020) fosaprepitant (EMEND) 150 mg in sodium chloride 0.9 % 145 mL IVPB, 150 mg, Intravenous,  Once, 5 of 8 cycles Administration: 150 mg (06/16/2020), 150 mg (09/08/2020), 150 mg (07/07/2020), 150 mg (07/28/2020), 150 mg (08/18/2020) pembrolizumab (KEYTRUDA) 200 mg in sodium chloride 0.9 % 50 mL chemo infusion, 150 mg, Intravenous, Once, 5 of 8 cycles Dose modification: 200 mg (original dose 2 mg/kg, Cycle 2, Reason: Provider Judgment, Comment: flat dose per manufacturer) Administration: 200 mg (06/16/2020), 200 mg (07/07/2020), 200 mg (09/08/2020), 200 mg (07/28/2020), 200 mg (08/18/2020)  for chemotherapy treatment.    09/22/2020 Genetic Testing   Negative genetic testing. No pathogenic variants identified on the [facility] Common Hereditary Cancers Panel. The report date is 09/22/2020.  The Common  Hereditary Cancers Panel offered by [facility] includes sequencing and/or deletion duplication testing of the following 48 genes: APC, ATM, AXIN2, BARD1, BMPR1A, BRCA1, BRCA2,  BRIP1, CDH1, CDKN2A (p14ARF), CDKN2A (p16INK4a), CKD4, CHEK2, CTNNA1, DICER1, EPCAM (Deletion/duplication testing only), GREM1 (promoter region deletion/duplication testing only), KIT, MEN1, MLH1, MSH2, MSH3, MSH6, MUTYH, NBN, NF1, NHTL1, PALB2, PDGFRA, PMS2, POLD1, POLE, PTEN, RAD50, RAD51C, RAD51D, RNF43, SDHB, SDHC, SDHD, SMAD4, SMARCA4. STK11, TP53, TSC1, TSC2, and VHL.  The following genes were evaluated for sequence changes only: SDHA and HOXB13 c.251G>A variant only.     FAMILY HISTORY:  We obtained a detailed, 4-generation family history.  Significant diagnoses are listed below: Family History  Problem Relation Age of Onset  . Diverticulitis Mother   . Hypertension Maternal Aunt   . Hypertension Maternal Uncle   . Cancer Paternal Grandmother        unknown type; d. 51s  . Breast cancer Cousin 55       paternal   . Pancreatic cancer Cousin        paternal; dx 86s    Ms. Blankenbaker's mother passed away at age 49 and did not have cancer. No maternal family history of cancer was reported. Ms. George Ina father passed away at age 60 and did not have cancer. Ms. Inda Coke has a paternal cousin diagnosed with breast cancer at age 27 and a paternal cousin diagnosed with pancreatic cancer in his 92s. Ms. George Ina paternal grandmother had an unknown type of cancer and passed away in her 28s. No other paternal family history of cancer was reported.   Ms.Yeattsis unawareof previous family history of genetic testing for hereditary cancer risks. Patient's maternal ancestors are of [place], and paternal ancestors are of [place], [place], and [place]. There is noreported Ashkenazi Jewish ancestry. There is noknown consanguinity.  GENETIC TEST RESULTS: Genetic testing reported out on 09/22/2020 through the [facility] Common Hereditary cancer panel found no pathogenic mutations.   The Common Hereditary Cancers Panel offered by [facility] includes sequencing and/or deletion duplication testing of  the following 48 genes: APC, ATM, AXIN2, BARD1, BMPR1A, BRCA1, BRCA2, BRIP1, CDH1, CDKN2A (p14ARF), CDKN2A (p16INK4a), CKD4, CHEK2, CTNNA1, DICER1, EPCAM (Deletion/duplication testing only), GREM1 (promoter region deletion/duplication testing only), KIT, MEN1, MLH1, MSH2, MSH3, MSH6, MUTYH, NBN, NF1, NHTL1, PALB2, PDGFRA, PMS2, POLD1, POLE, PTEN, RAD50, RAD51C, RAD51D, RNF43, SDHB, SDHC, SDHD, SMAD4, SMARCA4. STK11, TP53, TSC1, TSC2, and VHL.  The following genes were evaluated for sequence changes only: SDHA and HOXB13 c.251G>A variant only.   The test report has been scanned into EPIC and is located under the Molecular Pathology section of the Results Review tab.  A portion of the result report is included below for reference.     We discussed with Ms. Vacca that because current genetic testing is not perfect, it is possible there may be a gene mutation in one of these genes that current testing cannot detect, but that chance is small.  We also discussed, that there could be another gene that has not yet been discovered, or that we have not yet tested, that is responsible for the cancer diagnoses in the family. It is also possible there is a hereditary cause for the cancer in the family that Ms. Spielmann did not inherit and therefore was not identified in her testing.  Therefore, it is important to remain in touch with cancer genetics in the future so that we can continue to offer Ms. Peloso the most up to date genetic testing.   ADDITIONAL GENETIC TESTING: We discussed  with Ms. Turton that her genetic testing was fairly extensive.  If there are genes identified to increase cancer risk that can be analyzed in the future, we would be happy to discuss and coordinate this testing at that time.    CANCER SCREENING RECOMMENDATIONS: Ms. Patman test result is considered negative (normal).  This means that we have not identified a hereditary cause for her  personal and family history of cancer at this time.  Most cancers happen by chance and this negative test suggests that her cancer may fall into this category.    While reassuring, this does not definitively rule out a hereditary predisposition to cancer. It is still possible that there could be genetic mutations that are undetectable by current technology. There could be genetic mutations in genes that have not been tested or identified to increase cancer risk.  Therefore, it is recommended she continue to follow the cancer management and screening guidelines provided by her oncology and primary healthcare provider.   An individual's cancer risk and medical management are not determined by genetic test results alone. Overall cancer risk assessment incorporates additional factors, including personal medical history, family history, and any available genetic information that may result in a personalized plan for cancer prevention and surveillance.  RECOMMENDATIONS FOR FAMILY MEMBERS:  Relatives in this family might be at some increased risk of developing cancer, over the general population risk, simply due to the family history of cancer.  We recommended female relatives in this family have a yearly mammogram beginning at age 68, or 73 years younger than the earliest onset of cancer, an annual clinical breast exam, and perform monthly breast self-exams. Female relatives in this family should also have a gynecological exam as recommended by their primary provider.  All family members should be referred for colonoscopy starting at age 7.    It is also possible there is a hereditary cause for the cancer in Ms. Whetstine's family that she did not inherit and therefore was not identified in her.  Based on Ms. Trotti's family history, we recommended paternal relatives (cousins, siblings) have genetic counseling and testing. Ms. Tondreau will let us know if we can be of any assistance in coordinating genetic counseling and/or testing for these family  members.  FOLLOW-UP: Lastly, we discussed with Ms. Strickling that cancer genetics is a rapidly advancing field and it is possible that new genetic tests will be appropriate for her and/or her family members in the future. We encouraged her to remain in contact with cancer genetics on an annual basis so we can update her personal and family histories and let her know of advances in cancer genetics that may benefit this family.   Our contact number was provided. Ms. Lapenna questions were answered to her satisfaction, and she knows she is welcome to call us at anytime with additional questions or concerns.   Faith Rogue, MS, [facility] Genetic Counselor [email or web address] Phone: [phone]

## 2020-09-22 NOTE — Assessment & Plan Note (Signed)
05/26/2020:Patient palpated a left breast mass for 5 months. Mammogram and US showed a 2.7cm mass at the 10:30 position, one left axillary lymph node with cortical thickening, calcifications in the lower inner left breast, and a 0.4cm group of calcifications in the right breast. Biopsy on 05/14/20 showed no malignancy in the right breast, and in the left breast, DCIS, high grade, ER/PR negative. 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   Treatment plan: 1.  Neoadjuvant chemotherapy with Adriamycin Cytoxan Keytruda every 3 weeks x4 followed by Taxol Doristine Church followed by Beryle Flock maintenance 2. Breast conserving surgery with targeted node dissection 2. Adjuvant radiation therapy --------------------------------------------------------------------------------------------------------------------------------------------- Current treatment: Completed 4 cycles of Adriamycin, Cytoxan, Keytruda, currently receiving Taxol carboplatin cycle 3 today Labs have been reviewed  Chemo toxicities: 1.  Maculopapular rash and extremities: It was obvious hydrocortisone cream. 2. oral thrush: I sent a prescription for Diflucan 3.  Nail fungus: Ketoconazole cream 4.  Chemo induced anemia Monitoring closely for toxicities I would like TSH levels to be checked with each treatment.  Return to clinic weekly for Taxol treatments.

## 2020-09-22 NOTE — Progress Notes (Signed)
Patient presents today for treatment.  Vital signs with in parameters.  Labs 09/21/20 reviewed and with in parameters for treatment.  Patient complains of extreme weakness after treatments, she has noticed that her toes on her left foot are discolored and brownish in color, she also complains that she has a rash to her bilateral hands that itches and is unsure if it is dry skin or from the chemotherapy.  Patient also mentioned that she has thrush and has been using the magic mouth wash and that she thinks that she may have a yeast infection in her urethra.

## 2020-09-28 ENCOUNTER — Other Ambulatory Visit (HOSPITAL_COMMUNITY): Payer: BC Managed Care – PPO

## 2020-09-29 ENCOUNTER — Inpatient Hospital Stay (HOSPITAL_COMMUNITY): Payer: BC Managed Care – PPO

## 2020-09-29 ENCOUNTER — Other Ambulatory Visit: Payer: Self-pay

## 2020-09-29 ENCOUNTER — Inpatient Hospital Stay (HOSPITAL_BASED_OUTPATIENT_CLINIC_OR_DEPARTMENT_OTHER): Payer: BC Managed Care – PPO | Admitting: Oncology

## 2020-09-29 VITALS — BP 114/71 | HR 69 | Temp 97.3°F | Resp 18

## 2020-09-29 VITALS — BP 129/74 | HR 76 | Temp 97.0°F | Resp 18 | Wt 155.4 lb

## 2020-09-29 DIAGNOSIS — C50812 Malignant neoplasm of overlapping sites of left female breast: Secondary | ICD-10-CM

## 2020-09-29 DIAGNOSIS — Z171 Estrogen receptor negative status [ER-]: Secondary | ICD-10-CM

## 2020-09-29 DIAGNOSIS — C50212 Malignant neoplasm of upper-inner quadrant of left female breast: Secondary | ICD-10-CM | POA: Diagnosis not present

## 2020-09-29 LAB — COMPREHENSIVE METABOLIC PANEL
ALT: 25 U/L (ref 0–44)
AST: 39 U/L (ref 15–41)
Albumin: 4.1 g/dL (ref 3.5–5.0)
Alkaline Phosphatase: 59 U/L (ref 38–126)
Anion gap: 8 (ref 5–15)
BUN: 11 mg/dL (ref 8–23)
CO2: 26 mmol/L (ref 22–32)
Calcium: 9.2 mg/dL (ref 8.9–10.3)
Chloride: 102 mmol/L (ref 98–111)
Creatinine, Ser: 0.67 mg/dL (ref 0.44–1.00)
GFR, Estimated: >60 mL/min (ref >60)
Glucose, Bld: 116 mg/dL — ABNORMAL HIGH (ref 70–99)
Potassium: 3.7 mmol/L (ref 3.5–5.1)
Sodium: 136 mmol/L (ref 135–145)
Total Bilirubin: 0.7 mg/dL (ref 0.3–1.2)
Total Protein: 6.5 g/dL (ref 6.5–8.1)

## 2020-09-29 LAB — CBC WITH DIFFERENTIAL/PLATELET
Abs Immature Granulocytes: 0.01 10*3/uL (ref 0.00–0.07)
Basophils Absolute: 0 10*3/uL (ref 0.0–0.1)
Basophils Relative: 1 %
Eosinophils Absolute: 0.1 10*3/uL (ref 0.0–0.5)
Eosinophils Relative: 3 %
HCT: 30.5 % — ABNORMAL LOW (ref 36.0–46.0)
Hemoglobin: 10.2 g/dL — ABNORMAL LOW (ref 12.0–15.0)
Immature Granulocytes: 0 %
Lymphocytes Relative: 24 %
Lymphs Abs: 0.8 10*3/uL (ref 0.7–4.0)
MCH: 35.8 pg — ABNORMAL HIGH (ref 26.0–34.0)
MCHC: 33.4 g/dL (ref 30.0–36.0)
MCV: 107 fL — ABNORMAL HIGH (ref 80.0–100.0)
Monocytes Absolute: 0.3 10*3/uL (ref 0.1–1.0)
Monocytes Relative: 8 %
Neutro Abs: 2.1 10*3/uL (ref 1.7–7.7)
Neutrophils Relative %: 64 %
Platelets: 194 10*3/uL (ref 150–400)
RBC: 2.85 MIL/uL — ABNORMAL LOW (ref 3.87–5.11)
RDW: 18.6 % — ABNORMAL HIGH (ref 11.5–15.5)
WBC: 3.3 10*3/uL — ABNORMAL LOW (ref 4.0–10.5)
nRBC: 0 % (ref 0.0–0.2)

## 2020-09-29 LAB — TSH: TSH: 53.213 u[IU]/mL — ABNORMAL HIGH (ref 0.350–4.500)

## 2020-09-29 MED ORDER — DIPHENHYDRAMINE HCL 50 MG/ML IJ SOLN
INTRAMUSCULAR | Status: AC
  Filled 2020-09-29: qty 1

## 2020-09-29 MED ORDER — SODIUM CHLORIDE 0.9 % IV SOLN: Freq: Once | INTRAVENOUS | Status: AC

## 2020-09-29 MED ORDER — FAMOTIDINE IN NACL 20-0.9 MG/50ML-% IV SOLN
INTRAVENOUS | Status: AC
  Filled 2020-09-29: qty 50

## 2020-09-29 MED ORDER — SODIUM CHLORIDE 0.9 % IV SOLN
134.8500 mg | Freq: Once | INTRAVENOUS | Status: AC
  Administered 2020-09-29: 130 mg via INTRAVENOUS
  Filled 2020-09-29: qty 13

## 2020-09-29 MED ORDER — SODIUM CHLORIDE 0.9% FLUSH: 10.0000 mL | INTRAVENOUS | Status: DC | PRN

## 2020-09-29 MED ORDER — PALONOSETRON HCL INJECTION 0.25 MG/5ML
INTRAVENOUS | Status: AC
  Filled 2020-09-29: qty 5

## 2020-09-29 MED ORDER — DIPHENHYDRAMINE HCL 50 MG/ML IJ SOLN
25.0000 mg | Freq: Once | INTRAMUSCULAR | Status: AC
  Administered 2020-09-29: 25 mg via INTRAVENOUS

## 2020-09-29 MED ORDER — SODIUM CHLORIDE 0.9 % IV SOLN
80.0000 mg/m2 | Freq: Once | INTRAVENOUS | Status: AC
  Administered 2020-09-29: 150 mg via INTRAVENOUS
  Filled 2020-09-29: qty 25

## 2020-09-29 MED ORDER — PALONOSETRON HCL INJECTION 0.25 MG/5ML
0.2500 mg | Freq: Once | INTRAVENOUS | Status: AC
  Administered 2020-09-29: 0.25 mg via INTRAVENOUS

## 2020-09-29 MED ORDER — SODIUM CHLORIDE 0.9 % IV SOLN
10.0000 mg | Freq: Once | INTRAVENOUS | Status: AC
  Administered 2020-09-29: 10 mg via INTRAVENOUS
  Filled 2020-09-29: qty 10

## 2020-09-29 MED ORDER — FAMOTIDINE IN NACL 20-0.9 MG/50ML-% IV SOLN
20.0000 mg | Freq: Once | INTRAVENOUS | Status: AC
  Administered 2020-09-29: 20 mg via INTRAVENOUS

## 2020-09-29 MED ORDER — HEPARIN SOD (PORK) LOCK FLUSH 100 UNIT/ML IV SOLN
500.0000 [IU] | Freq: Once | INTRAVENOUS | Status: AC | PRN
  Administered 2020-09-29: 500 [IU]

## 2020-09-29 NOTE — Addendum Note (Signed)
Addended by: Benjiman Core D on: 09/29/2020 12:22 PM   Modules accepted: Orders

## 2020-09-29 NOTE — Progress Notes (Signed)
Patient seen by Dr. Benay Spice today. Vital signs are within parameters for treatment. MAR reviewed. Patient has a rash bilateral upper extremities. Message received from Dr. Benay Spice CEdwards LPN to proceed with treatment today. Patient to received Taxol and Carboplatin only today. HOLD KEYTRUDA due to rash per secure message.

## 2020-09-29 NOTE — Patient Instructions (Signed)
You were here today for your chemotherapy treatment.  Please call the clinic should you have any needs and we will see you next week as scheduled.

## 2020-09-29 NOTE — Progress Notes (Signed)
Jo OFFICE PROGRESS NOTE   Diagnosis: Breast cancer  INTERVAL HISTORY:   Jo Mills returns as scheduled.  She is completing a course of neoadjuvant chemotherapy/immunotherapy for breast cancer.  She was treated with Taxol and carboplatin on 09/22/2020.  Mild numbness at the balls of the feet.  No other peripheral numbness.  No symptom of allergic reaction. She completed a course of Diflucan for oral candidiasis and a vaginal yeast infection.  The yeast infection has improved.  She continues to have toenail fungus.  She developed a rash over the forearms last week.  She was prescribed hydrocortisone cream.  This did not help.  The rash persists.  No pain in the hands or feet. She has developed a painful ulcer at the left side of the tongue. Objective:  Vital signs in last 24 hours:  Blood pressure 129/74, pulse 76, temperature (!) 97 F (36.1 C), temperature source Temporal, resp. rate 18, weight 155 lb 6.4 oz (70.5 kg), SpO2 100 %.    HEENT: No thrush.  3-4 mm ulcer at the left side of the tongue.  No other ulcers. Resp: Lungs clear bilaterally Cardio: Regular rate and rhythm GI: No hepatomegaly, nontender Vascular: No leg edema  Skin: Skin thickening and dryness at the palms and soles.  Toenail fungus bilaterally.  Erythematous maculopapular rash over the forearms and dorsum of the hand bilaterally.  Portacath/PICC-without erythema  Lab Results:  Lab Results  Component Value Date   WBC 3.3 (L) 09/29/2020   HGB 10.2 (L) 09/29/2020   HCT 30.5 (L) 09/29/2020   MCV 107.0 (H) 09/29/2020   PLT 194 09/29/2020   NEUTROABS 2.1 09/29/2020    CMP  Lab Results  Component Value Date   NA 136 09/29/2020   K 3.7 09/29/2020   CL 102 09/29/2020   CO2 26 09/29/2020   GLUCOSE 116 (H) 09/29/2020   BUN 11 09/29/2020   CREATININE 0.67 09/29/2020   CALCIUM 9.2 09/29/2020   PROT 6.5 09/29/2020   ALBUMIN 4.1 09/29/2020   AST 39 09/29/2020   ALT 25 09/29/2020    ALKPHOS 59 09/29/2020   BILITOT 0.7 09/29/2020   GFRNONAA >60 09/29/2020   GFRAA >60 07/07/2020    No results found for: CEA1  No results found for: INR  Imaging:  No results found.  Medications: I have reviewed the patient's current medications.   Assessment/Plan: 1.  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  Rash-the rash is most likely secondary to pembrolizumab, pembrolizumab will be held with this cycle, we can consider a course of prednisone if the rash progresses 3.  Mouth ulcer secondary to chemotherapy-she will use Magic mouthwash and Orajel 4.  Hypothyroidism-maintained on thyroid hormone replacement Disposition: Jo Mills is completing a course of neoadjuvant systemic therapy for treatment of left-sided breast cancer.  She appears to be tolerating the treatment well, though she has developed a rash that appears typical of pembrolizumab.  We will hold pembrolizumab with the cycle of chemotherapy today.  Pembrolizumab may be resumed within the next few weeks if the rash improves.  She has hypothyroidism secondary to pembrolizumab.  The TSH is lower today.  The plan will be to increase the thyroid hormone dose if the TSH does not decrease over the next few weeks.  She will return for Taxol/carboplatin in 1 week.  She will be scheduled  for an office visit in 2 weeks.  I decrease the Benadryl dose as she reports somnolence after receiving Benadryl and has not developed an allergic reaction from Taxol/carboplatin.  Betsy Coder, MD  09/29/2020  10:36 AM

## 2020-09-29 NOTE — Progress Notes (Addendum)
Report received from Lin Givens, RN.  Patient receiving D1C6 Carboplatin/Taxol today per MD orders.  Patient's Keytruda holding today due to rash.  Patient denies any issues at this time.  We are waiting for her premedication wait times before starting her treatment.  Patient denies any needs at this time.    Patient tolerated treatment today without incidence.  She left clinic ambulatory and in stable condition with her husband.  She will follow up as scheduled.

## 2020-09-29 NOTE — Progress Notes (Signed)
Notes from this encounter were charted under infusion encounter.

## 2020-10-05 ENCOUNTER — Other Ambulatory Visit (HOSPITAL_COMMUNITY): Payer: BC Managed Care – PPO

## 2020-10-05 ENCOUNTER — Other Ambulatory Visit: Payer: Self-pay

## 2020-10-05 ENCOUNTER — Inpatient Hospital Stay (HOSPITAL_COMMUNITY): Payer: BC Managed Care – PPO

## 2020-10-05 DIAGNOSIS — C50212 Malignant neoplasm of upper-inner quadrant of left female breast: Secondary | ICD-10-CM | POA: Diagnosis not present

## 2020-10-05 DIAGNOSIS — Z171 Estrogen receptor negative status [ER-]: Secondary | ICD-10-CM

## 2020-10-05 DIAGNOSIS — C50812 Malignant neoplasm of overlapping sites of left female breast: Secondary | ICD-10-CM

## 2020-10-05 LAB — CBC WITH DIFFERENTIAL/PLATELET
Abs Immature Granulocytes: 0.01 10*3/uL (ref 0.00–0.07)
Basophils Absolute: 0.1 10*3/uL (ref 0.0–0.1)
Basophils Relative: 2 %
Eosinophils Absolute: 0.1 10*3/uL (ref 0.0–0.5)
Eosinophils Relative: 3 %
HCT: 30.6 % — ABNORMAL LOW (ref 36.0–46.0)
Hemoglobin: 10.2 g/dL — ABNORMAL LOW (ref 12.0–15.0)
Immature Granulocytes: 0 %
Lymphocytes Relative: 36 %
Lymphs Abs: 1 10*3/uL (ref 0.7–4.0)
MCH: 36.3 pg — ABNORMAL HIGH (ref 26.0–34.0)
MCHC: 33.3 g/dL (ref 30.0–36.0)
MCV: 108.9 fL — ABNORMAL HIGH (ref 80.0–100.0)
Monocytes Absolute: 0.2 10*3/uL (ref 0.1–1.0)
Monocytes Relative: 8 %
Neutro Abs: 1.5 10*3/uL — ABNORMAL LOW (ref 1.7–7.7)
Neutrophils Relative %: 51 %
Platelets: 213 10*3/uL (ref 150–400)
RBC: 2.81 MIL/uL — ABNORMAL LOW (ref 3.87–5.11)
RDW: 17.9 % — ABNORMAL HIGH (ref 11.5–15.5)
WBC: 2.9 10*3/uL — ABNORMAL LOW (ref 4.0–10.5)
nRBC: 0 % (ref 0.0–0.2)

## 2020-10-05 LAB — COMPREHENSIVE METABOLIC PANEL
ALT: 24 U/L (ref 0–44)
AST: 35 U/L (ref 15–41)
Albumin: 4.2 g/dL (ref 3.5–5.0)
Alkaline Phosphatase: 52 U/L (ref 38–126)
Anion gap: 8 (ref 5–15)
BUN: 11 mg/dL (ref 8–23)
CO2: 26 mmol/L (ref 22–32)
Calcium: 9.4 mg/dL (ref 8.9–10.3)
Chloride: 101 mmol/L (ref 98–111)
Creatinine, Ser: 0.71 mg/dL (ref 0.44–1.00)
GFR, Estimated: >60 mL/min (ref >60)
Glucose, Bld: 89 mg/dL (ref 70–99)
Potassium: 3.6 mmol/L (ref 3.5–5.1)
Sodium: 135 mmol/L (ref 135–145)
Total Bilirubin: 0.6 mg/dL (ref 0.3–1.2)
Total Protein: 6.4 g/dL — ABNORMAL LOW (ref 6.5–8.1)

## 2020-10-06 ENCOUNTER — Other Ambulatory Visit: Payer: Self-pay

## 2020-10-06 ENCOUNTER — Inpatient Hospital Stay (HOSPITAL_COMMUNITY): Payer: BC Managed Care – PPO

## 2020-10-06 ENCOUNTER — Other Ambulatory Visit: Payer: Self-pay | Admitting: Medical

## 2020-10-06 ENCOUNTER — Encounter (HOSPITAL_COMMUNITY): Payer: Self-pay

## 2020-10-06 VITALS — BP 111/75 | HR 71 | Temp 97.2°F | Resp 18 | Wt 157.4 lb

## 2020-10-06 DIAGNOSIS — Z171 Estrogen receptor negative status [ER-]: Secondary | ICD-10-CM

## 2020-10-06 DIAGNOSIS — C50812 Malignant neoplasm of overlapping sites of left female breast: Secondary | ICD-10-CM | POA: Diagnosis not present

## 2020-10-06 LAB — TSH: TSH: 44.115 u[IU]/mL — ABNORMAL HIGH (ref 0.350–4.500)

## 2020-10-06 MED ORDER — SODIUM CHLORIDE 0.9% FLUSH
10.0000 mL | INTRAVENOUS | Status: DC | PRN
Start: 2020-10-06 — End: 2020-10-06
  Administered 2020-10-06 (×2): 10 mL

## 2020-10-06 MED ORDER — FAMOTIDINE IN NACL 20-0.9 MG/50ML-% IV SOLN
20.0000 mg | Freq: Once | INTRAVENOUS | Status: AC
  Administered 2020-10-06: 20 mg via INTRAVENOUS
  Filled 2020-10-06: qty 50

## 2020-10-06 MED ORDER — SODIUM CHLORIDE 0.9 % IV SOLN
134.8500 mg | Freq: Once | INTRAVENOUS | Status: AC
  Administered 2020-10-06: 130 mg via INTRAVENOUS
  Filled 2020-10-06: qty 13

## 2020-10-06 MED ORDER — DIPHENHYDRAMINE HCL 50 MG/ML IJ SOLN
25.0000 mg | Freq: Once | INTRAMUSCULAR | Status: AC
  Administered 2020-10-06: 25 mg via INTRAVENOUS
  Filled 2020-10-06: qty 1

## 2020-10-06 MED ORDER — HEPARIN SOD (PORK) LOCK FLUSH 100 UNIT/ML IV SOLN
500.0000 [IU] | Freq: Once | INTRAVENOUS | Status: AC | PRN
Start: 2020-10-06 — End: 2020-10-06
  Administered 2020-10-06: 500 [IU]

## 2020-10-06 MED ORDER — LEVOTHYROXINE SODIUM 50 MCG PO TABS: 50.0000 ug | ORAL_TABLET | Freq: Every day | ORAL | 1 refills | Status: DC

## 2020-10-06 MED ORDER — PACLITAXEL CHEMO INJECTION 300 MG/50ML
80.0000 mg/m2 | Freq: Once | INTRAVENOUS | Status: AC
  Administered 2020-10-06: 150 mg via INTRAVENOUS
  Filled 2020-10-06: qty 25

## 2020-10-06 MED ORDER — SODIUM CHLORIDE 0.9 % IV SOLN
20.0000 mg | Freq: Once | INTRAVENOUS | Status: AC
  Administered 2020-10-06: 20 mg via INTRAVENOUS
  Filled 2020-10-06: qty 20

## 2020-10-06 MED ORDER — SODIUM CHLORIDE 0.9 % IV SOLN: Freq: Once | INTRAVENOUS | Status: AC

## 2020-10-06 MED ORDER — PALONOSETRON HCL INJECTION 0.25 MG/5ML
0.2500 mg | Freq: Once | INTRAVENOUS | Status: AC
  Administered 2020-10-06: 0.25 mg via INTRAVENOUS
  Filled 2020-10-06: qty 5

## 2020-10-06 NOTE — Progress Notes (Signed)
Jo Mills tolerated chemo tx well without complaints or incident. Labs reviewed with VTanner PA prior to administering the chemotherapy. TSH level also reviewed with PA and pt to remain on Synthroid 50 mcg 1 PO daily per PA with refill sent into pharmacy.VSS Pt discharged self ambulatory in satisfactory condition accompanied by her husband

## 2020-10-06 NOTE — Patient Instructions (Signed)
[facility] Discharge Instructions for Patients Receiving Chemotherapy   Beginning January 23rd 2017 lab work for the Cancer Center will be done in the  Main lab at [facility] on 1st floor. If you have a lab appointment with the Cancer Center please come in thru the  Main Entrance and check in at the main information desk   Today you received the following chemotherapy agents Taxol and Carboplatin. Follow-up as scheduled  To help prevent nausea and vomiting after your treatment, we encourage you to take your nausea medication   If you develop nausea and vomiting, or diarrhea that is not controlled by your medication, call the clinic.  The clinic phone number is [phone]. Office hours are Monday-Friday 8:30am-5:00pm.  BELOW ARE SYMPTOMS THAT SHOULD BE REPORTED IMMEDIATELY:  *FEVER GREATER THAN 101.0 F  *CHILLS WITH OR WITHOUT FEVER  NAUSEA AND VOMITING THAT IS NOT CONTROLLED WITH YOUR NAUSEA MEDICATION  *UNUSUAL SHORTNESS OF BREATH  *UNUSUAL BRUISING OR BLEEDING  TENDERNESS IN MOUTH AND THROAT WITH OR WITHOUT PRESENCE OF ULCERS  *URINARY PROBLEMS  *BOWEL PROBLEMS  UNUSUAL RASH Items with * indicate a potential emergency and should be followed up as soon as possible. If you have an emergency after office hours please contact your primary care physician or go to the nearest emergency department.  Please call the clinic during office hours if you have any questions or concerns.   You may also contact the Patient Navigator at [phone] should you have any questions or need assistance in obtaining follow up care.      Resources For Cancer Patients and their Caregivers ? American Cancer Society: Can assist with transportation, wigs, general needs, runs Look Good Feel Better.        1-888-227-6333 ? Cancer Care: Provides financial assistance, online support groups, medication/co-pay assistance.  1-800-813-HOPE (4673) ? [facility] Assists [place] cancer patients and their families through emotional , educational and financial support.  336-427-4357 ? [place] DSS Where to apply for food stamps, Medicaid and utility assistance. 336-342-1394 ? [facility]: Transportation to medical appointments. 336-347-2287 ? Social Security Administration: May apply for disability if have a Stage IV cancer. 336-342-7796 1-800-772-1213 ? [place] Aging, Disability and Transit Services: Assists with nutrition, care and transit needs. 336-349-2343         

## 2020-10-12 ENCOUNTER — Inpatient Hospital Stay (HOSPITAL_COMMUNITY): Payer: BC Managed Care – PPO

## 2020-10-12 ENCOUNTER — Other Ambulatory Visit: Payer: Self-pay

## 2020-10-12 DIAGNOSIS — C50812 Malignant neoplasm of overlapping sites of left female breast: Secondary | ICD-10-CM

## 2020-10-12 DIAGNOSIS — C50212 Malignant neoplasm of upper-inner quadrant of left female breast: Secondary | ICD-10-CM | POA: Diagnosis not present

## 2020-10-12 LAB — COMPREHENSIVE METABOLIC PANEL
ALT: 25 U/L (ref 0–44)
AST: 26 U/L (ref 15–41)
Albumin: 3.9 g/dL (ref 3.5–5.0)
Alkaline Phosphatase: 53 U/L (ref 38–126)
Anion gap: 8 (ref 5–15)
BUN: 12 mg/dL (ref 8–23)
CO2: 26 mmol/L (ref 22–32)
Calcium: 9.1 mg/dL (ref 8.9–10.3)
Chloride: 101 mmol/L (ref 98–111)
Creatinine, Ser: 0.72 mg/dL (ref 0.44–1.00)
GFR, Estimated: >60 mL/min (ref >60)
Glucose, Bld: 92 mg/dL (ref 70–99)
Potassium: 3.6 mmol/L (ref 3.5–5.1)
Sodium: 135 mmol/L (ref 135–145)
Total Bilirubin: 0.6 mg/dL (ref 0.3–1.2)
Total Protein: 6.1 g/dL — ABNORMAL LOW (ref 6.5–8.1)

## 2020-10-12 LAB — TSH: TSH: 42.245 u[IU]/mL — ABNORMAL HIGH (ref 0.350–4.500)

## 2020-10-12 LAB — CBC WITH DIFFERENTIAL/PLATELET
Abs Immature Granulocytes: 0.01 10*3/uL (ref 0.00–0.07)
Basophils Absolute: 0 10*3/uL (ref 0.0–0.1)
Basophils Relative: 1 %
Eosinophils Absolute: 0.1 10*3/uL (ref 0.0–0.5)
Eosinophils Relative: 2 %
HCT: 31.6 % — ABNORMAL LOW (ref 36.0–46.0)
Hemoglobin: 10.6 g/dL — ABNORMAL LOW (ref 12.0–15.0)
Immature Granulocytes: 0 %
Lymphocytes Relative: 36 %
Lymphs Abs: 1.1 10*3/uL (ref 0.7–4.0)
MCH: 36.8 pg — ABNORMAL HIGH (ref 26.0–34.0)
MCHC: 33.5 g/dL (ref 30.0–36.0)
MCV: 109.7 fL — ABNORMAL HIGH (ref 80.0–100.0)
Monocytes Absolute: 0.3 10*3/uL (ref 0.1–1.0)
Monocytes Relative: 9 %
Neutro Abs: 1.5 10*3/uL — ABNORMAL LOW (ref 1.7–7.7)
Neutrophils Relative %: 52 %
Platelets: 180 10*3/uL (ref 150–400)
RBC: 2.88 MIL/uL — ABNORMAL LOW (ref 3.87–5.11)
RDW: 17.4 % — ABNORMAL HIGH (ref 11.5–15.5)
WBC: 3 10*3/uL — ABNORMAL LOW (ref 4.0–10.5)
nRBC: 0 % (ref 0.0–0.2)

## 2020-10-13 ENCOUNTER — Inpatient Hospital Stay (HOSPITAL_BASED_OUTPATIENT_CLINIC_OR_DEPARTMENT_OTHER): Payer: BC Managed Care – PPO | Admitting: Hematology

## 2020-10-13 ENCOUNTER — Inpatient Hospital Stay (HOSPITAL_COMMUNITY): Payer: BC Managed Care – PPO

## 2020-10-13 VITALS — BP 124/70 | HR 79 | Temp 96.7°F | Resp 18 | Wt 156.8 lb

## 2020-10-13 VITALS — BP 119/80 | HR 63 | Temp 97.8°F | Resp 18

## 2020-10-13 DIAGNOSIS — C50212 Malignant neoplasm of upper-inner quadrant of left female breast: Secondary | ICD-10-CM | POA: Diagnosis not present

## 2020-10-13 DIAGNOSIS — Z171 Estrogen receptor negative status [ER-]: Secondary | ICD-10-CM

## 2020-10-13 DIAGNOSIS — C50812 Malignant neoplasm of overlapping sites of left female breast: Secondary | ICD-10-CM

## 2020-10-13 MED ORDER — PALONOSETRON HCL INJECTION 0.25 MG/5ML
0.2500 mg | Freq: Once | INTRAVENOUS | Status: AC
  Administered 2020-10-13: 0.25 mg via INTRAVENOUS
  Filled 2020-10-13: qty 5

## 2020-10-13 MED ORDER — SODIUM CHLORIDE 0.9% FLUSH
10.0000 mL | INTRAVENOUS | Status: DC | PRN
  Administered 2020-10-13 (×2): 10 mL

## 2020-10-13 MED ORDER — LEVOTHYROXINE SODIUM 75 MCG PO TABS: 75.0000 ug | ORAL_TABLET | Freq: Every day | ORAL | 1 refills | Status: DC

## 2020-10-13 MED ORDER — FAMOTIDINE IN NACL 20-0.9 MG/50ML-% IV SOLN
20.0000 mg | Freq: Once | INTRAVENOUS | Status: AC
  Administered 2020-10-13: 20 mg via INTRAVENOUS
  Filled 2020-10-13: qty 50

## 2020-10-13 MED ORDER — DEXAMETHASONE 4 MG PO TABS: ORAL_TABLET | ORAL | 1 refills | Status: DC

## 2020-10-13 MED ORDER — SODIUM CHLORIDE 0.9 % IV SOLN
80.0000 mg/m2 | Freq: Once | INTRAVENOUS | Status: AC
  Administered 2020-10-13: 150 mg via INTRAVENOUS
  Filled 2020-10-13: qty 25

## 2020-10-13 MED ORDER — SODIUM CHLORIDE 0.9 % IV SOLN: Freq: Once | INTRAVENOUS | Status: AC

## 2020-10-13 MED ORDER — SODIUM CHLORIDE 0.9 % IV SOLN
20.0000 mg | Freq: Once | INTRAVENOUS | Status: AC
  Administered 2020-10-13: 20 mg via INTRAVENOUS
  Filled 2020-10-13: qty 20

## 2020-10-13 MED ORDER — SODIUM CHLORIDE 0.9 % IV SOLN
134.8500 mg | Freq: Once | INTRAVENOUS | Status: AC
Start: 2020-10-13 — End: 2020-10-13
  Administered 2020-10-13: 130 mg via INTRAVENOUS
  Filled 2020-10-13: qty 13

## 2020-10-13 MED ORDER — DIPHENHYDRAMINE HCL 50 MG/ML IJ SOLN
25.0000 mg | Freq: Once | INTRAMUSCULAR | Status: AC
  Administered 2020-10-13: 25 mg via INTRAVENOUS
  Filled 2020-10-13: qty 1

## 2020-10-13 MED ORDER — HEPARIN SOD (PORK) LOCK FLUSH 100 UNIT/ML IV SOLN
500.0000 [IU] | Freq: Once | INTRAVENOUS | Status: AC | PRN
  Administered 2020-10-13: 500 [IU]

## 2020-10-13 NOTE — Patient Instructions (Signed)
[facility] Discharge Instructions for Patients Receiving Chemotherapy   Beginning January 23rd 2017 lab work for the Cancer Center will be done in the  Main lab at [place] on 1st floor. If you have a lab appointment with the Cancer Center please come in thru the  Main Entrance and check in at the main information desk   Today you received the following chemotherapy agents Taxol and Carboplatin. Follow-up as scheduled  To help prevent nausea and vomiting after your treatment, we encourage you to take your nausea medication   If you develop nausea and vomiting, or diarrhea that is not controlled by your medication, call the clinic.  The clinic phone number is [phone]. Office hours are Monday-Friday 8:30am-5:00pm.  BELOW ARE SYMPTOMS THAT SHOULD BE REPORTED IMMEDIATELY:  *FEVER GREATER THAN 101.0 F  *CHILLS WITH OR WITHOUT FEVER  NAUSEA AND VOMITING THAT IS NOT CONTROLLED WITH YOUR NAUSEA MEDICATION  *UNUSUAL SHORTNESS OF BREATH  *UNUSUAL BRUISING OR BLEEDING  TENDERNESS IN MOUTH AND THROAT WITH OR WITHOUT PRESENCE OF ULCERS  *URINARY PROBLEMS  *BOWEL PROBLEMS  UNUSUAL RASH Items with * indicate a potential emergency and should be followed up as soon as possible. If you have an emergency after office hours please contact your primary care physician or go to the nearest emergency department.  Please call the clinic during office hours if you have any questions or concerns.   You may also contact the Patient Navigator at [phone] should you have any questions or need assistance in obtaining follow up care.      Resources For Cancer Patients and their Caregivers ? American Cancer Society: Can assist with transportation, wigs, general needs, runs Look Good Feel Better.        1-888-227-6333 ? Cancer Care: Provides financial assistance, online support groups, medication/co-pay assistance.  1-800-813-HOPE (4673) ? [facility] Assists [place] cancer patients and their families through emotional , educational and financial support.  336-427-4357 ? [place] DSS Where to apply for food stamps, Medicaid and utility assistance. 336-342-1394 ? [facility]: Transportation to medical appointments. 336-347-2287 ? Social Security Administration: May apply for disability if have a Stage IV cancer. 336-342-7796 1-800-772-1213 ? [place] [facility]ging, Disability and Transit Services: Assists with nutrition, care and transit needs. 336-349-2343         

## 2020-10-13 NOTE — Patient Instructions (Signed)
[facility] at [facility] Discharge Instructions  You were seen today by Dr. Ellin Saba. He went over your recent results. You received your treatment today. Take the Decadron 4 mg once daily for 3 days after your chemo to see if your rash improves. Your Synthroid will be increased to 75 mcg to take every morning (50 mcg plus 25 mcg). Dr. Ellin Saba will see you back in 1 week for labs and follow up.   Thank you for choosing [facility] at [facility] to provide your oncology and hematology care.  To afford each patient quality time with our provider, please arrive at least 15 minutes before your scheduled appointment time.   If you have a lab appointment with the Cancer Center please come in thru the Main Entrance and check in at the main information desk  You need to re-schedule your appointment should you arrive 10 or more minutes late.  We strive to give you quality time with our providers, and arriving late affects you and other patients whose appointments are after yours.  Also, if you no show three or more times for appointments you may be dismissed from the clinic at the providers discretion.     Again, thank you for choosing [facility].  Our hope is that these requests will decrease the amount of time that you wait before being seen by our physicians.       _____________________________________________________________  Should you have questions after your visit to [facility], please contact our office at [phone] between the hours of 8:00 a.m. and 4:30 p.m.  Voicemails left after 4:00 p.m. will not be returned until the following business day.  For prescription refill requests, have your pharmacy contact our office and allow 72 hours.    Cancer Center Support Programs:   > Cancer Support Group  2nd Tuesday of the month 1pm-2pm, Journey Room

## 2020-10-13 NOTE — Progress Notes (Signed)
1149 Labs reviewed with and pt seen by Dr. Ellin Saba and pt approved for Taxol and Carboplatin infusions today per MD          Jo Mills tolerated Taxol and Carboplatin infusions well without complaints or incident. VSS upon discharge. Pt discharged self ambulatory in satisfactory condition accompanied by her husband

## 2020-10-13 NOTE — Progress Notes (Signed)
[place],  [id number]   CLINIC:  Medical Oncology/Hematology  PCP:  Jo Scarce, MD [address] 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 Mammogram and US showed a 2.7cm mass at the 10:30 position, one left axillary lymph node with cortical thickening, calcifications in the lower inner left breast, and a 0.4cm group of calcifications in the right breast. Biopsy on 05/14/20 showed no malignancy in the right breast, and in the left breast, DCIS, high grade, ER/PR negative. 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    06/16/2020 -  Chemotherapy   The patient had dexamethasone (DECADRON) 4 MG tablet, 1 of 1 cycle, Start date: 06/08/2020, End date: -- DOXOrubicin (ADRIAMYCIN) chemo injection 112 mg, 60 mg/m2 = 112 mg, Intravenous,  Once, 4 of 4 cycles Administration: 112 mg (06/16/2020), 112 mg (07/07/2020), 112 mg (07/28/2020), 112 mg (08/18/2020) palonosetron (ALOXI) injection 0.25 mg, 0.25 mg, Intravenous,  Once, 6 of 8 cycles Administration: 0.25 mg (06/16/2020), 0.25 mg (09/08/2020), 0.25 mg (07/07/2020), 0.25 mg (07/28/2020), 0.25 mg (08/18/2020), 0.25 mg (09/15/2020), 0.25 mg (09/22/2020), 0.25 mg  (09/29/2020), 0.25 mg (10/06/2020) pegfilgrastim-bmez (ZIEXTENZO) injection 6 mg, 6 mg, Subcutaneous,  Once, 4 of 4 cycles Administration: 6 mg (06/19/2020), 6 mg (07/09/2020), 6 mg (07/29/2020), 6 mg (08/19/2020) CARBOplatin (PARAPLATIN) 130 mg in sodium chloride 0.9 % 100 mL chemo infusion, 130 mg (100 % of original dose 134.85 mg), Intravenous,  Once, 2 of 4 cycles Dose modification:   (original dose 134.85 mg, Cycle 5) Administration: 130 mg (09/08/2020), 130 mg (09/29/2020), 130 mg (09/15/2020), 130 mg (09/22/2020), 130 mg (10/06/2020) cyclophosphamide (CYTOXAN) 1,120 mg in sodium chloride 0.9 % 250 mL chemo infusion, 600 mg/m2 = 1,120 mg, Intravenous,  Once, 4 of 4 cycles Administration: 1,120 mg (06/16/2020), 1,120 mg (07/07/2020), 1,120 mg (07/28/2020), 1,120 mg (08/18/2020) PACLitaxel (TAXOL) 150 mg in sodium chloride 0.9 % 250 mL chemo infusion (</= 32m/m2), 80 mg/m2 = 150 mg, Intravenous,  Once, 2 of 4 cycles Administration: 150 mg (09/08/2020), 150 mg (09/15/2020), 150 mg (09/29/2020), 150 mg (09/22/2020), 150 mg (10/06/2020) fosaprepitant (EMEND) 150 mg in sodium chloride 0.9 % 145 mL IVPB, 150 mg, Intravenous,  Once, 5 of 5 cycles Administration: 150 mg (06/16/2020), 150 mg (09/08/2020), 150 mg (07/07/2020), 150 mg (07/28/2020), 150 mg (08/18/2020) pembrolizumab (KEYTRUDA) 200 mg in sodium chloride 0.9 % 50 mL chemo infusion, 150 mg, Intravenous, Once, 5 of 7 cycles Dose modification: 200 mg (original dose 2 mg/kg, Cycle 2, Reason: Provider Judgment, Comment: flat dose per manufacturer) Administration: 200 mg (06/16/2020), 200 mg (07/07/2020), 200 mg (09/08/2020), 200 mg (07/28/2020), 200 mg (08/18/2020)  for chemotherapy treatment.    09/22/2020 Genetic Testing   Negative genetic testing. No pathogenic variants  identified on the [facility] Common Hereditary Cancers Panel. The report date is 09/22/2020.  The Common Hereditary Cancers Panel offered by [facility] includes sequencing and/or deletion duplication  testing of the following 48 genes: APC, ATM, AXIN2, BARD1, BMPR1A, BRCA1, BRCA2, BRIP1, CDH1, CDKN2A (p14ARF), CDKN2A (p16INK4a), CKD4, CHEK2, CTNNA1, DICER1, EPCAM (Deletion/duplication testing only), GREM1 (promoter region deletion/duplication testing only), KIT, MEN1, MLH1, MSH2, MSH3, MSH6, MUTYH, NBN, NF1, NHTL1, PALB2, PDGFRA, PMS2, POLD1, POLE, PTEN, RAD50, RAD51C, RAD51D, RNF43, SDHB, SDHC, SDHD, SMAD4, SMARCA4. STK11, TP53, TSC1, TSC2, and VHL.  The following genes were evaluated for sequence changes only: SDHA and HOXB13 c.251G>A variant only.     CANCER STAGING: Cancer Staging Malignant neoplasm of overlapping sites of left breast in female, estrogen receptor negative ([place]) Staging form: Breast, AJCC 8th Edition - Clinical stage from 05/17/2020: Stage IIB (cT2, cN0, cM0, G3, ER-, PR-, HER2-) - Signed by Jo Phlegm, NP on 05/26/2020   INTERVAL HISTORY:  [name], a 69 y.o. female, returns for routine follow-up and consideration for next cycle of chemotherapy. Jo Mills was last seen by Jo Mills on 09/29/2020.  Due for day #15 of cycle #6 of carboplatin, paclitaxel and Aloxi today.   Today she is accompanied by her husband. Overall, she tells me she has been feeling pretty well. She tolerated the previous treatment well and continues having an intermittent rash on both forearms which itches and a few spots on her anterior thighs. Her rash erupted on 12/1 when she received her second dose of paclitaxel. She applied cortisone cream but it did not help. She has an oral ulcer on the left side of her tongue which is irritated when she eats. She is applying ketoconazole to her toes and reports that both lateral toenails have fallen off. She complains of having muscle weakness in her legs even though she continues walking and jogging QAM.  Overall, she feels ready for next cycle of chemo today.    REVIEW OF SYSTEMS:  Review of Systems  Constitutional: Positive for  appetite change (50%) and fatigue (75%).  HENT:   Positive for mouth sores (on L lateral side of tongue).   Skin: Positive for itching (on rash on forearms) and rash (on bilat forearms and thighs).    PAST MEDICAL/SURGICAL HISTORY:  Past Medical History:  Diagnosis Date   Allergy    Breast cancer ([place])    Cancer ([place])    Diverticulitis    Family history of breast cancer 08/17/2020   Family history of pancreatic cancer 08/17/2020   GERD (gastroesophageal reflux disease)    Hypertension    Skin cancer    Past Surgical History:  Procedure Laterality Date   COLONOSCOPY     PORTACATH PLACEMENT N/A 06/15/2020   Procedure: INSERTION PORT-A-CATH WITH ULTRASOUND GUIDANCE;  Surgeon: Donnie Mesa, MD;  Location: [facility];  Service: General;  Laterality: Right;  LMA, LEAVE PORT ACCESSED-CHEMO START 9/1    SOCIAL HISTORY:  Social History   Socioeconomic History   Marital status: Married    Spouse name: Not on file   Number of children: Not on file   Years of education: Not on file   Highest education level: Not on file  Occupational History   Not on file  Tobacco Use   Smoking status: Never Smoker   Smokeless tobacco: Never Used  Substance and Sexual Activity   Alcohol use: Not Currently   Drug use: Never   Sexual activity: Not on file  Other Topics  Concern   Not on file  Social History Narrative   Not on file   Social Determinants of Health   Financial Resource Strain: Low Risk    Difficulty of Paying Living Expenses: Not hard at all  Food Insecurity: No Food Insecurity   Worried About Charity fundraiser in the Last Year: Never true   [place] in the Last Year: Never true  Transportation Needs: No Transportation Needs   Lack of Transportation (Medical): No   Lack of Transportation (Non-Medical): No  Physical Activity: Sufficiently Active   Days of Exercise per Week: 7 days   Minutes of Exercise per Session: 60 min   Stress: Stress Concern Present   Feeling of Stress : To some extent  Social Connections: Moderately Integrated   Frequency of Communication with Friends and Family: Once a week   Frequency of Social Gatherings with Friends and Family: Never   Attends Religious Services: More than 4 times per year   Active Member of [employer] or Organizations: Yes   Attends Music therapist: 1 to 4 times per year   Marital Status: Married  Human resources officer Violence: Not At Risk   Fear of Current or Ex-Partner: No   Emotionally Abused: No   Physically Abused: No   Sexually Abused: No    FAMILY HISTORY:  Family History  Problem Relation Age of Onset   Diverticulitis Mother    Hypertension Maternal Aunt    Hypertension Maternal Uncle    Cancer Paternal Grandmother        unknown type; d. 31s   Breast cancer Cousin 25       paternal    Pancreatic cancer Cousin        paternal; dx 59s    CURRENT MEDICATIONS:  Current Outpatient Medications  Medication Sig Dispense Refill   amLODipine-benazepril (LOTREL) 5-20 MG capsule Take 1 capsule by mouth daily.     fexofenadine (ALLEGRA) 180 MG tablet Take 180 mg by mouth daily.     fluconazole (DIFLUCAN) 100 MG tablet Take 1 tablet (100 mg total) by mouth daily. 7 tablet 0   ketoconazole (NIZORAL) 2 % cream Apply 1 application topically daily. 30 g 1   Lactobacillus-Inulin (CULTURELLE DIGESTIVE DAILY) CAPS Take 1 capsule by mouth daily.     levothyroxine (SYNTHROID) 75 MCG tablet Take 1 tablet (75 mcg total) by mouth daily before breakfast. 30 tablet 1   LORazepam (ATIVAN) 0.5 MG tablet Take 1 tablet (0.5 mg total) by mouth at bedtime as needed for sleep. 30 tablet 0   omeprazole (PRILOSEC) 20 MG capsule Take 20 mg by mouth daily.     Bismuth Subsalicylate (PEPTO-BISMOL PO) Take by mouth as needed. (Patient not taking: Reported on 10/13/2020)     dexamethasone (DECADRON) 4 MG tablet Take 1 tablet daily for 3 days after  chemo with food 30 tablet 1   magic mouthwash SOLN Take 5 mLs by mouth 3 (three) times daily as needed for mouth pain. (Patient not taking: Reported on 10/13/2020) 450 mL 0   ondansetron (ZOFRAN) 8 MG tablet Take 1 tablet (8 mg total) by mouth 2 (two) times daily as needed. Start on the third day after chemotherapy. (Patient not taking: Reported on 10/13/2020) 30 tablet 1   prochlorperazine (COMPAZINE) 10 MG tablet Take 1 tablet (10 mg total) by mouth every 6 (six) hours as needed (Nausea or vomiting). (Patient not taking: Reported on 10/13/2020) 30 tablet 1   No current facility-administered medications  for this visit.   Facility-Administered Medications Ordered in Other Visits  Medication Dose Route Frequency Provider Last Rate Last Admin   sodium chloride flush (NS) 0.9 % injection 10 mL  10 mL Intracatheter PRN Ladell Pier, MD        ALLERGIES:  Allergies  Allergen Reactions   Grapefruit Extract Other (See Comments)    Other Reaction: Other reaction   Pseudoephedrine Hcl Other (See Comments)    Other Reaction: Other reaction   Nickel    Pineapple    Amoxicillin Rash    PHYSICAL EXAM:  Performance status (ECOG): 1 - Symptomatic but completely ambulatory  Vitals:   10/13/20 1114  BP: 124/70  Pulse: 79  Resp: 18  Temp: (!) 96.7 F (35.9 C)  SpO2: 98%   Wt Readings from Last 3 Encounters:  10/13/20 156 lb 12.8 oz (71.1 kg)  10/06/20 157 lb 6.4 oz (71.4 kg)  09/29/20 155 lb 6.4 oz (70.5 kg)   Physical Exam Vitals reviewed.  Constitutional:      Appearance: Normal appearance.  HENT:     Mouth/Throat:     Lips: No lesions.     Mouth: No oral lesions.     Tongue: No lesions.  Cardiovascular:     Rate and Rhythm: Normal rate and regular rhythm.     Pulses: Normal pulses.     Heart sounds: Normal heart sounds.  Pulmonary:     Effort: Pulmonary effort is normal.     Breath sounds: Normal breath sounds.  Musculoskeletal:     Right lower leg: No edema.      Left lower leg: No edema.  Skin:    Findings: Rash present. Rash is macular (rash on bilat forearms and hands).  Neurological:     General: No focal deficit present.     Mental Status: She is alert and oriented to person, place, and time.  Psychiatric:        Mood and Affect: Mood normal.        Behavior: Behavior normal.     LABORATORY DATA:   I have reviewed the labs as listed.  CBC Latest Ref Rng & Units 10/12/2020 10/05/2020 09/29/2020  WBC 4.0 - 10.5 K/uL 3.0(L) 2.9(L) 3.3(L)  Hemoglobin 12.0 - 15.0 g/dL 10.6(L) 10.2(L) 10.2(L)  Hematocrit 36.0 - 46.0 % 31.6(L) 30.6(L) 30.5(L)  Platelets 150 - 400 K/uL 180 213 194   CMP Latest Ref Rng & Units 10/12/2020 10/05/2020 09/29/2020  Glucose 70 - 99 mg/dL 92 89 116(H)  BUN 8 - 23 mg/dL _0 Creatinine 0.44 - 1.00 mg/dL 0.72 0.71 0.67  Sodium 135 - 145 mmol/L 135 135 136  Potassium 3.5 - 5.1 mmol/L 3.6 3.6 3.7  Chloride 98 - 111 mmol/L 101 101 102  CO2 22 - 32 mmol/L _1 Calcium 8.9 - 10.3 mg/dL 9.1 9.4 9.2  Total Protein 6.5 - 8.1 g/dL 6.1(L) 6.4(L) 6.5  Total Bilirubin 0.3 - 1.2 mg/dL 0.6 0.6 0.7  Alkaline Phos 38 - 126 U/L 53 52 59  AST 15 - 41 U/L 26 35 39  ALT 0 - 44 U/L _2 DIAGNOSTIC IMAGING:  I have independently reviewed the scans and discussed with the patient. No results found.   ASSESSMENT:  1. Stage II (T2N0) left breast TNBC: -Felt left breast mass for 5 months. -Mammogram on 05/07/2020 with suspicious mass in the 10:30 o'clock position in the left breast. Suspicious calcifications in the anterior  lower inner quadrant of the left breast. -MRI on 06/05/2020 shows left breast mass measuring 3.8 x 3.3 x 3.2 cm in the upper inner quadrant. 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 No suspicious lymphadenopathy. No MRI evidence of malignancy on the right. -Left breast upper inner quadrant biopsy on 05/14/2020 showed DCIS with calcifications. Right breast biopsy  shows fibroadenomatoid nodule with calcifications. ER/PR was negative. -Left breast core needle biopsy at 10:30 position shows invasive ductal carcinoma. ER/PR/HER-2 was negative. Ki-67 85%. Left lymph node biopsy was reactive. -Patient was evaluated by Dr. [name] and Dr. Georgette Dover. -She received first cycle of chemotherapy with Adriamycin and cyclophosphamide with pembrolizumab (keynote-522 trial)added for improved PCR by about 15%. -Cycle 1 of Adriamycin, cyclophosphamide and pembrolizumab on9/10/2019.  2. Family history: -Paternal cousin and second cousin had breast cancer. Paternal grandmother also had some type of cancer. -Another paternal cousin died of pancreatic cancer. Another paternal cousin had leukemia.   PLAN:  1. Triple negative left breast cancer: -She has completed 5 weekly doses of paclitaxel and carboplatin.  Last Beryle Flock was on 09/08/2020 and was held due to rash. -Reviewed labs which showed normal LFTs.  White count is 3.0 with ANC of 1.5. -We will proceed with her treatment today. -We will reevaluate her in 1 week.  If the rash improves, will consider reintroducing Keytruda.  2. Family history: -Genetic testing is negative.  3.  Skin rash: -She reportedly had very mild rash since the start of chemo.  However the rash has increased after second dose of carboplatin and Taxol. -She has maculopapular rash on the upper extremities and thighs.  It seems to be fading today. -She is using hydrocortisone cream which is not helping. -Her last Beryle Flock was held with suspicion of 8 contributing to the rash. -Recommend dexamethasone 4 mg daily for 3 days after today's treatment.  4. High risk drug monitoring: -Latest TSH is 42 today. -We will increase Synthroid to 75 mcg daily.   Orders placed this encounter:  No orders of the defined types were placed in this encounter.    Derek Jack, MD [place] [phone]   I, Milinda Antis, am acting as a scribe for Dr. Sanda Linger.  I, Derek Jack MD, have reviewed the above documentation for accuracy and completeness, and I agree with the above.

## 2020-10-16 DIAGNOSIS — C50919 Malignant neoplasm of unspecified site of unspecified female breast: Secondary | ICD-10-CM

## 2020-10-16 HISTORY — DX: Malignant neoplasm of unspecified site of unspecified female breast: C50.919

## 2020-10-19 ENCOUNTER — Inpatient Hospital Stay (HOSPITAL_COMMUNITY): Payer: BC Managed Care – PPO | Attending: Hematology

## 2020-10-19 ENCOUNTER — Other Ambulatory Visit: Payer: Self-pay

## 2020-10-19 DIAGNOSIS — M549 Dorsalgia, unspecified: Secondary | ICD-10-CM | POA: Insufficient documentation

## 2020-10-19 DIAGNOSIS — R5383 Other fatigue: Secondary | ICD-10-CM | POA: Insufficient documentation

## 2020-10-19 DIAGNOSIS — Z8249 Family history of ischemic heart disease and other diseases of the circulatory system: Secondary | ICD-10-CM | POA: Insufficient documentation

## 2020-10-19 DIAGNOSIS — Z5111 Encounter for antineoplastic chemotherapy: Secondary | ICD-10-CM | POA: Diagnosis present

## 2020-10-19 DIAGNOSIS — Z79899 Other long term (current) drug therapy: Secondary | ICD-10-CM | POA: Insufficient documentation

## 2020-10-19 DIAGNOSIS — R21 Rash and other nonspecific skin eruption: Secondary | ICD-10-CM | POA: Insufficient documentation

## 2020-10-19 DIAGNOSIS — K5900 Constipation, unspecified: Secondary | ICD-10-CM | POA: Diagnosis not present

## 2020-10-19 DIAGNOSIS — M2550 Pain in unspecified joint: Secondary | ICD-10-CM | POA: Insufficient documentation

## 2020-10-19 DIAGNOSIS — Z800 Family history of malignant neoplasm of digestive organs: Secondary | ICD-10-CM | POA: Insufficient documentation

## 2020-10-19 DIAGNOSIS — C50812 Malignant neoplasm of overlapping sites of left female breast: Secondary | ICD-10-CM | POA: Diagnosis present

## 2020-10-19 DIAGNOSIS — Z803 Family history of malignant neoplasm of breast: Secondary | ICD-10-CM | POA: Insufficient documentation

## 2020-10-19 DIAGNOSIS — R200 Anesthesia of skin: Secondary | ICD-10-CM | POA: Diagnosis not present

## 2020-10-19 DIAGNOSIS — Z171 Estrogen receptor negative status [ER-]: Secondary | ICD-10-CM | POA: Diagnosis not present

## 2020-10-19 DIAGNOSIS — Z809 Family history of malignant neoplasm, unspecified: Secondary | ICD-10-CM | POA: Diagnosis not present

## 2020-10-19 DIAGNOSIS — Z8379 Family history of other diseases of the digestive system: Secondary | ICD-10-CM | POA: Insufficient documentation

## 2020-10-19 DIAGNOSIS — Z7952 Long term (current) use of systemic steroids: Secondary | ICD-10-CM | POA: Diagnosis not present

## 2020-10-19 DIAGNOSIS — Z880 Allergy status to penicillin: Secondary | ICD-10-CM | POA: Insufficient documentation

## 2020-10-19 LAB — CBC WITH DIFFERENTIAL/PLATELET
Abs Immature Granulocytes: 0.03 10*3/uL (ref 0.00–0.07)
Basophils Absolute: 0 10*3/uL (ref 0.0–0.1)
Basophils Relative: 1 %
Eosinophils Absolute: 0 10*3/uL (ref 0.0–0.5)
Eosinophils Relative: 1 %
HCT: 32.7 % — ABNORMAL LOW (ref 36.0–46.0)
Hemoglobin: 11 g/dL — ABNORMAL LOW (ref 12.0–15.0)
Immature Granulocytes: 1 %
Lymphocytes Relative: 41 %
Lymphs Abs: 1.6 10*3/uL (ref 0.7–4.0)
MCH: 36.9 pg — ABNORMAL HIGH (ref 26.0–34.0)
MCHC: 33.6 g/dL (ref 30.0–36.0)
MCV: 109.7 fL — ABNORMAL HIGH (ref 80.0–100.0)
Monocytes Absolute: 0.2 10*3/uL (ref 0.1–1.0)
Monocytes Relative: 6 %
Neutro Abs: 2 10*3/uL (ref 1.7–7.7)
Neutrophils Relative %: 50 %
Platelets: 193 10*3/uL (ref 150–400)
RBC: 2.98 MIL/uL — ABNORMAL LOW (ref 3.87–5.11)
RDW: 16.6 % — ABNORMAL HIGH (ref 11.5–15.5)
WBC: 4 10*3/uL (ref 4.0–10.5)
nRBC: 0 % (ref 0.0–0.2)

## 2020-10-19 LAB — COMPREHENSIVE METABOLIC PANEL
ALT: 28 U/L (ref 0–44)
AST: 27 U/L (ref 15–41)
Albumin: 3.8 g/dL (ref 3.5–5.0)
Alkaline Phosphatase: 63 U/L (ref 38–126)
Anion gap: 9 (ref 5–15)
BUN: 14 mg/dL (ref 8–23)
CO2: 25 mmol/L (ref 22–32)
Calcium: 9.1 mg/dL (ref 8.9–10.3)
Chloride: 101 mmol/L (ref 98–111)
Creatinine, Ser: 0.64 mg/dL (ref 0.44–1.00)
GFR, Estimated: >60 mL/min (ref >60)
Glucose, Bld: 81 mg/dL (ref 70–99)
Potassium: 3.4 mmol/L — ABNORMAL LOW (ref 3.5–5.1)
Sodium: 135 mmol/L (ref 135–145)
Total Bilirubin: 0.5 mg/dL (ref 0.3–1.2)
Total Protein: 6.3 g/dL — ABNORMAL LOW (ref 6.5–8.1)

## 2020-10-19 LAB — TSH: TSH: 30.783 u[IU]/mL — ABNORMAL HIGH (ref 0.350–4.500)

## 2020-10-20 ENCOUNTER — Ambulatory Visit (HOSPITAL_COMMUNITY): Payer: BC Managed Care – PPO | Admitting: Hematology

## 2020-10-20 ENCOUNTER — Inpatient Hospital Stay (HOSPITAL_BASED_OUTPATIENT_CLINIC_OR_DEPARTMENT_OTHER): Payer: BC Managed Care – PPO | Admitting: Hematology

## 2020-10-20 ENCOUNTER — Inpatient Hospital Stay (HOSPITAL_COMMUNITY): Payer: BC Managed Care – PPO

## 2020-10-20 ENCOUNTER — Other Ambulatory Visit: Payer: Self-pay

## 2020-10-20 VITALS — BP 118/65 | HR 74 | Temp 97.9°F | Resp 18

## 2020-10-20 VITALS — BP 119/80 | HR 69 | Temp 97.3°F | Resp 18 | Wt 156.9 lb

## 2020-10-20 DIAGNOSIS — Z171 Estrogen receptor negative status [ER-]: Secondary | ICD-10-CM

## 2020-10-20 DIAGNOSIS — Z5111 Encounter for antineoplastic chemotherapy: Secondary | ICD-10-CM | POA: Diagnosis not present

## 2020-10-20 DIAGNOSIS — C50812 Malignant neoplasm of overlapping sites of left female breast: Secondary | ICD-10-CM | POA: Diagnosis not present

## 2020-10-20 MED ORDER — PALONOSETRON HCL INJECTION 0.25 MG/5ML
0.2500 mg | Freq: Once | INTRAVENOUS | Status: AC
  Administered 2020-10-20: 0.25 mg via INTRAVENOUS

## 2020-10-20 MED ORDER — FAMOTIDINE IN NACL 20-0.9 MG/50ML-% IV SOLN
20.0000 mg | Freq: Once | INTRAVENOUS | Status: AC
  Administered 2020-10-20: 20 mg via INTRAVENOUS

## 2020-10-20 MED ORDER — HEPARIN SOD (PORK) LOCK FLUSH 100 UNIT/ML IV SOLN
500.0000 [IU] | Freq: Once | INTRAVENOUS | Status: AC | PRN
  Administered 2020-10-20: 500 [IU]

## 2020-10-20 MED ORDER — SODIUM CHLORIDE 0.9 % IV SOLN
80.0000 mg/m2 | Freq: Once | INTRAVENOUS | Status: AC
  Administered 2020-10-20: 150 mg via INTRAVENOUS
  Filled 2020-10-20: qty 25

## 2020-10-20 MED ORDER — SODIUM CHLORIDE 0.9% FLUSH
10.0000 mL | INTRAVENOUS | Status: DC | PRN
  Administered 2020-10-20 (×2): 10 mL

## 2020-10-20 MED ORDER — DIPHENHYDRAMINE HCL 50 MG/ML IJ SOLN
25.0000 mg | Freq: Once | INTRAMUSCULAR | Status: AC
  Administered 2020-10-20: 25 mg via INTRAVENOUS
  Filled 2020-10-20: qty 1

## 2020-10-20 MED ORDER — SODIUM CHLORIDE 0.9 % IV SOLN
Freq: Once | INTRAVENOUS | Status: AC
Start: 2020-10-20 — End: 2020-10-20

## 2020-10-20 MED ORDER — SODIUM CHLORIDE 0.9 % IV SOLN
10.0000 mg | Freq: Once | INTRAVENOUS | Status: DC
  Filled 2020-10-20: qty 1

## 2020-10-20 MED ORDER — PALONOSETRON HCL INJECTION 0.25 MG/5ML
INTRAVENOUS | Status: AC
  Filled 2020-10-20: qty 5

## 2020-10-20 MED ORDER — SODIUM CHLORIDE 0.9 % IV SOLN
134.8500 mg | Freq: Once | INTRAVENOUS | Status: AC
  Administered 2020-10-20: 130 mg via INTRAVENOUS
  Filled 2020-10-20: qty 13

## 2020-10-20 MED ORDER — FAMOTIDINE IN NACL 20-0.9 MG/50ML-% IV SOLN
INTRAVENOUS | Status: AC
  Filled 2020-10-20: qty 50

## 2020-10-20 MED ORDER — SODIUM CHLORIDE 0.9 % IV SOLN
20.0000 mg | Freq: Once | INTRAVENOUS | Status: AC
  Administered 2020-10-20: 20 mg via INTRAVENOUS
  Filled 2020-10-20: qty 20

## 2020-10-20 NOTE — Patient Instructions (Signed)
[facility] Discharge Instructions for Patients Receiving Chemotherapy   Beginning January 23rd 2017 lab work for the Cancer Center will be done in the  Main lab at  on 1st floor. If you have a lab appointment with the Cancer Center please come in thru the  Main Entrance and check in at the main information desk   Today you received the following chemotherapy agents Taxol and Carboplatin. Follow-up as scheduled  To help prevent nausea and vomiting after your treatment, we encourage you to take your nausea medication   If you develop nausea and vomiting, or diarrhea that is not controlled by your medication, call the clinic.  The clinic phone number is [phone]. Office hours are Monday-Friday 8:30am-5:00pm.  BELOW ARE SYMPTOMS THAT SHOULD BE REPORTED IMMEDIATELY:  *FEVER GREATER THAN 101.0 F  *CHILLS WITH OR WITHOUT FEVER  NAUSEA AND VOMITING THAT IS NOT CONTROLLED WITH YOUR NAUSEA MEDICATION  *UNUSUAL SHORTNESS OF BREATH  *UNUSUAL BRUISING OR BLEEDING  TENDERNESS IN MOUTH AND THROAT WITH OR WITHOUT PRESENCE OF ULCERS  *URINARY PROBLEMS  *BOWEL PROBLEMS  UNUSUAL RASH Items with * indicate a potential emergency and should be followed up as soon as possible. If you have an emergency after office hours please contact your primary care physician or go to the nearest emergency department.  Please call the clinic during office hours if you have any questions or concerns.   You may also contact the Patient Navigator at [phone] should you have any questions or need assistance in obtaining follow up care.      Resources For Cancer Patients and their Caregivers ? American Cancer Society: Can assist with transportation, wigs, general needs, runs Look Good Feel Better.        1-888-227-6333 ? Cancer Care: Provides financial assistance, online support groups, medication/co-pay assistance.  1-800-813-HOPE (4673) ? [facility] Assists [place] cancer patients and their families through emotional , educational and financial support.  336-427-4357 ? [place] DSS Where to apply for food stamps, Medicaid and utility assistance. 336-342-1394 ? [facility]: Transportation to medical appointments. 336-347-2287 ? Social Security Administration: May apply for disability if have a Stage IV cancer. 336-342-7796 1-800-772-1213 ? [place] Aging, Disability and Transit Services: Assists with nutrition, care and transit needs. 336-349-2343         

## 2020-10-20 NOTE — Patient Instructions (Signed)
[facility] at [facility] Discharge Instructions  You were seen today by Dr. Ellin Saba. He went over your recent results. You received your treatment today; continue getting your weekly treatment. Take Decadron 4 mg for 3 days after receiving your treatment. Dr. Ellin Saba will see you back in 3 weeks for labs and follow up.   Thank you for choosing [facility] at [facility] to provide your oncology and hematology care.  To afford each patient quality time with our provider, please arrive at least 15 minutes before your scheduled appointment time.   If you have a lab appointment with the Cancer Center please come in thru the Main Entrance and check in at the main information desk  You need to re-schedule your appointment should you arrive 10 or more minutes late.  We strive to give you quality time with our providers, and arriving late affects you and other patients whose appointments are after yours.  Also, if you no show three or more times for appointments you may be dismissed from the clinic at the providers discretion.     Again, thank you for choosing [facility].  Our hope is that these requests will decrease the amount of time that you wait before being seen by our physicians.       _____________________________________________________________  Should you have questions after your visit to [facility], please contact our office at [phone] between the hours of 8:00 a.m. and 4:30 p.m.  Voicemails left after 4:00 p.m. will not be returned until the following business day.  For prescription refill requests, have your pharmacy contact our office and allow 72 hours.    Cancer Center Support Programs:   > Cancer Support Group  2nd Tuesday of the month 1pm-2pm, Journey Room

## 2020-10-20 NOTE — Progress Notes (Signed)
1250 Labs reviewed with and pt seen by Dr. Ellin Saba and pt approved for Taxol and Carboplatin infusions today per MD          Jo Mills tolerated chemo tx well without complaints or incident. VSS upon discharge. Pt discharged self ambulatory in satisfactory condition

## 2020-10-20 NOTE — Progress Notes (Signed)
Patient assessed and labs reviewed by Dr. Ellin Saba. Okay for treatment but holding Keytruda. Primary RN and pharmacy is aware.

## 2020-10-20 NOTE — Progress Notes (Signed)
[place], [place] [address]   CLINIC:  Medical Oncology/Hematology  PCP:  Roderic Scarce, MD [address] 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 Mammogram and US showed a 2.7cm mass at the 10:30 position, one left axillary lymph node with cortical thickening, calcifications in the lower inner left breast, and a 0.4cm group of calcifications in the right breast. Biopsy on 05/14/20 showed no malignancy in the right breast, and in the left breast, DCIS, high grade, ER/PR negative. 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    06/16/2020 -  Chemotherapy   The patient had dexamethasone (DECADRON) 4 MG tablet, 1 of 1 cycle, Start date: 10/13/2020, End date: -- DOXOrubicin (ADRIAMYCIN) chemo injection 112 mg, 60 mg/m2 = 112 mg, Intravenous,  Once, 4 of 4 cycles Administration: 112 mg (06/16/2020), 112 mg (07/07/2020), 112 mg (07/28/2020), 112 mg (08/18/2020) palonosetron (ALOXI) injection 0.25 mg, 0.25 mg, Intravenous,  Once, 6 of 8 cycles Administration: 0.25 mg (06/16/2020), 0.25 mg (09/08/2020), 0.25 mg (07/07/2020), 0.25 mg (07/28/2020), 0.25 mg (08/18/2020), 0.25 mg (09/15/2020), 0.25 mg (09/22/2020),  0.25 mg (09/29/2020), 0.25 mg (10/06/2020), 0.25 mg (10/13/2020) pegfilgrastim-bmez (ZIEXTENZO) injection 6 mg, 6 mg, Subcutaneous,  Once, 4 of 4 cycles Administration: 6 mg (06/19/2020), 6 mg (07/09/2020), 6 mg (07/29/2020), 6 mg (08/19/2020) CARBOplatin (PARAPLATIN) 130 mg in sodium chloride 0.9 % 100 mL chemo infusion, 130 mg (100 % of original dose 134.85 mg), Intravenous,  Once, 2 of 4 cycles Dose modification:   (original dose 134.85 mg, Cycle 5) Administration: 130 mg (09/08/2020), 130 mg (09/29/2020), 130 mg (09/15/2020), 130 mg (09/22/2020), 130 mg (10/06/2020), 130 mg (10/13/2020) cyclophosphamide (CYTOXAN) 1,120 mg in sodium chloride 0.9 % 250 mL chemo infusion, 600 mg/m2 = 1,120 mg, Intravenous,  Once, 4 of 4 cycles Administration: 1,120 mg (06/16/2020), 1,120 mg (07/07/2020), 1,120 mg (07/28/2020), 1,120 mg (08/18/2020) PACLitaxel (TAXOL) 150 mg in sodium chloride 0.9 % 250 mL chemo infusion (</= 67m/m2), 80 mg/m2 = 150 mg, Intravenous,  Once, 2 of 4 cycles Administration: 150 mg (09/08/2020), 150 mg (09/15/2020), 150 mg (09/29/2020), 150 mg (09/22/2020), 150 mg (10/06/2020), 150 mg (10/13/2020) fosaprepitant (EMEND) 150 mg in sodium chloride 0.9 % 145 mL IVPB, 150 mg, Intravenous,  Once, 5 of 5 cycles Administration: 150 mg (06/16/2020), 150 mg (09/08/2020), 150 mg (07/07/2020), 150 mg (07/28/2020), 150 mg (08/18/2020) pembrolizumab (KEYTRUDA) 200 mg in sodium chloride 0.9 % 50 mL chemo infusion, 150 mg, Intravenous, Once, 5 of 7 cycles Dose modification: 200 mg (original dose 2 mg/kg, Cycle 2, Reason: Provider Judgment, Comment: flat dose per manufacturer) Administration: 200 mg (06/16/2020), 200 mg (07/07/2020), 200 mg (09/08/2020), 200 mg (07/28/2020), 200 mg (08/18/2020)  for chemotherapy treatment.    09/22/2020 Genetic  Testing   Negative genetic testing. No pathogenic variants identified on the [facility] Common Hereditary Cancers Panel. The report date is 09/22/2020.  The Common Hereditary Cancers  Panel offered by [facility] includes sequencing and/or deletion duplication testing of the following 48 genes: APC, ATM, AXIN2, BARD1, BMPR1A, BRCA1, BRCA2, BRIP1, CDH1, CDKN2A (p14ARF), CDKN2A (p16INK4a), CKD4, CHEK2, CTNNA1, DICER1, EPCAM (Deletion/duplication testing only), GREM1 (promoter region deletion/duplication testing only), KIT, MEN1, MLH1, MSH2, MSH3, MSH6, MUTYH, NBN, NF1, NHTL1, PALB2, PDGFRA, PMS2, POLD1, POLE, PTEN, RAD50, RAD51C, RAD51D, RNF43, SDHB, SDHC, SDHD, SMAD4, SMARCA4. STK11, TP53, TSC1, TSC2, and VHL.  The following genes were evaluated for sequence changes only: SDHA and HOXB13 c.251G>A variant only.     CANCER STAGING: Cancer Staging Malignant neoplasm of overlapping sites of left breast in female, estrogen receptor negative ([place]) Staging form: Breast, AJCC 8th Edition - Clinical stage from 05/17/2020: Stage IIB (cT2, cN0, cM0, G3, ER-, PR-, HER2-) - Signed by Gardenia Phlegm, NP on 05/26/2020   INTERVAL HISTORY:  [name], a 70 y.o. female, returns for routine follow-up and consideration for next cycle of chemotherapy. Sarh was last seen on 10/13/2020.  Due for cycle #7 of carboplatin, paclitaxel and Keytruda today.   Today she is accompanied by her husband. Overall, she tells me she has been feeling well. She reports having dry skin and rash which is prominent on her hands and forearms which she keeps moisturized; she also has a rash on her back and lower legs. She reports having pain under the nails of her fingers and at the fingertips, but denies having numbness or tingling. She took Decadron on Wednesday, Thursday and Friday. She started taking Synthroid 1.5 tablets daily. She continues having mouth soreness which improves with Decadron. She denies having N/V/D or leg swelling.  Overall, she feels ready for next cycle of chemo today.    REVIEW OF SYSTEMS:  Review of Systems  Constitutional: Positive for fatigue (90%). Negative for appetite change  (100%).  HENT:   Positive for mouth sores (soreness in mouth).   Cardiovascular: Negative for leg swelling.  Gastrointestinal: Negative for diarrhea, nausea and vomiting.  Musculoskeletal: Positive for arthralgias (pain at fingertips).  Skin: Positive for rash (on hands and forearms, back and lower legs).  Neurological: Negative for numbness.  All other systems reviewed and are negative.   PAST MEDICAL/SURGICAL HISTORY:  Past Medical History:  Diagnosis Date  . Allergy   . Breast cancer ([place])   . Cancer ([place])   . Diverticulitis   . Family history of breast cancer 08/17/2020  . Family history of pancreatic cancer 08/17/2020  . GERD (gastroesophageal reflux disease)   . Hypertension   . Skin cancer    Past Surgical History:  Procedure Laterality Date  . COLONOSCOPY    . PORTACATH PLACEMENT N/A 06/15/2020   Procedure: INSERTION PORT-A-CATH WITH ULTRASOUND GUIDANCE;  Surgeon: Donnie Mesa, MD;  Location: [facility];  Service: General;  Laterality: Right;  LMA, LEAVE PORT ACCESSED-CHEMO START 9/1    SOCIAL HISTORY:  Social History   Socioeconomic History  . Marital status: Married    Spouse name: Not on file  . Number of children: Not on file  . Years of education: Not on file  . Highest education level: Not on file  Occupational History  . Not on file  Tobacco Use  . Smoking status: Never Smoker  . Smokeless tobacco: Never Used  Substance and Sexual Activity  . Alcohol use: Not Currently  .  Drug use: Never  . Sexual activity: Not on file  Other Topics Concern  . Not on file  Social History Narrative  . Not on file   Social Determinants of Health   Financial Resource Strain: Low Risk   . Difficulty of Paying Living Expenses: Not hard at all  Food Insecurity: No Food Insecurity  . Worried About Charity fundraiser in the Last Year: Never true  . Ran Out of Food in the Last Year: Never true  Transportation Needs: No Transportation Needs  . Lack of  Transportation (Medical): No  . Lack of Transportation (Non-Medical): No  Physical Activity: Sufficiently Active  . Days of Exercise per Week: 7 days  . Minutes of Exercise per Session: 60 min  Stress: Stress Concern Present  . Feeling of Stress : To some extent  Social Connections: Moderately Integrated  . Frequency of Communication with Friends and Family: Once a week  . Frequency of Social Gatherings with Friends and Family: Never  . Attends Religious Services: More than 4 times per year  . Active Member of Clubs or Organizations: Yes  . Attends Archivist Meetings: 1 to 4 times per year  . Marital Status: Married  Human resources officer Violence: Not At Risk  . Fear of Current or Ex-Partner: No  . Emotionally Abused: No  . Physically Abused: No  . Sexually Abused: No    FAMILY HISTORY:  Family History  Problem Relation Age of Onset  . Diverticulitis Mother   . Hypertension Maternal Aunt   . Hypertension Maternal Uncle   . Cancer Paternal Grandmother        unknown type; d. 50s  . Breast cancer Cousin 60       paternal   . Pancreatic cancer Cousin        paternal; dx 31s    CURRENT MEDICATIONS:  Current Outpatient Medications  Medication Sig Dispense Refill  . amLODipine-benazepril (LOTREL) 5-20 MG capsule Take 1 capsule by mouth daily.    . Bismuth Subsalicylate (PEPTO-BISMOL PO) Take by mouth as needed. (Patient not taking: Reported on 10/13/2020)    . dexamethasone (DECADRON) 4 MG tablet Take 1 tablet daily for 3 days after chemo with food 30 tablet 1  . fexofenadine (ALLEGRA) 180 MG tablet Take 180 mg by mouth daily.    . fluconazole (DIFLUCAN) 100 MG tablet Take 1 tablet (100 mg total) by mouth daily. 7 tablet 0  . ketoconazole (NIZORAL) 2 % cream Apply 1 application topically daily. 30 g 1  . Lactobacillus-Inulin (CULTURELLE DIGESTIVE DAILY) CAPS Take 1 capsule by mouth daily.    Marland Kitchen levothyroxine (SYNTHROID) 75 MCG tablet Take 1 tablet (75 mcg total) by mouth  daily before breakfast. 30 tablet 1  . lidocaine-prilocaine (EMLA) cream SMARTSIG:1 Topical Every Night    . LORazepam (ATIVAN) 0.5 MG tablet Take 1 tablet (0.5 mg total) by mouth at bedtime as needed for sleep. 30 tablet 0  . magic mouthwash SOLN Take 5 mLs by mouth 3 (three) times daily as needed for mouth pain. (Patient not taking: Reported on 10/13/2020) 450 mL 0  . omeprazole (PRILOSEC) 20 MG capsule Take 20 mg by mouth daily.    . ondansetron (ZOFRAN) 8 MG tablet Take 1 tablet (8 mg total) by mouth 2 (two) times daily as needed. Start on the third day after chemotherapy. (Patient not taking: Reported on 10/13/2020) 30 tablet 1  . prochlorperazine (COMPAZINE) 10 MG tablet Take 1 tablet (10 mg total) by mouth  every 6 (six) hours as needed (Nausea or vomiting). (Patient not taking: Reported on 10/13/2020) 30 tablet 1   No current facility-administered medications for this visit.   Facility-Administered Medications Ordered in Other Visits  Medication Dose Route Frequency Provider Last Rate Last Admin  . sodium chloride flush (NS) 0.9 % injection 10 mL  10 mL Intracatheter PRN Ladell Pier, MD        ALLERGIES:  Allergies  Allergen Reactions  . Grapefruit Extract Other (See Comments)    Other Reaction: Other reaction  . Pseudoephedrine Hcl Other (See Comments)    Other Reaction: Other reaction  . Nickel   . Pineapple   . Amoxicillin Rash    PHYSICAL EXAM:  Performance status (ECOG): 1 - Symptomatic but completely ambulatory  Vitals:   10/20/20 1227  BP: 119/80  Pulse: 69  Resp: 18  Temp: (!) 97.3 F (36.3 C)  SpO2: 98%   Wt Readings from Last 3 Encounters:  10/20/20 156 lb 14.4 oz (71.2 kg)  10/13/20 156 lb 12.8 oz (71.1 kg)  10/06/20 157 lb 6.4 oz (71.4 kg)   Physical Exam Vitals reviewed.  Constitutional:      Appearance: Normal appearance.  HENT:     Mouth/Throat:     Lips: No lesions.     Mouth: No oral lesions.     Dentition: No gum lesions.     Tongue:  No lesions.     Tonsils: No tonsillar exudate.  Cardiovascular:     Rate and Rhythm: Normal rate and regular rhythm.     Pulses: Normal pulses.     Heart sounds: Normal heart sounds.  Pulmonary:     Effort: Pulmonary effort is normal.     Breath sounds: Normal breath sounds.  Musculoskeletal:     Right lower leg: No edema.     Left lower leg: No edema.  Neurological:     General: No focal deficit present.     Mental Status: She is alert and oriented to person, place, and time.  Psychiatric:        Mood and Affect: Mood normal.        Behavior: Behavior normal.     LABORATORY DATA:  I have reviewed the labs as listed.  CBC Latest Ref Rng & Units 10/19/2020 10/12/2020 10/05/2020  WBC 4.0 - 10.5 K/uL 4.0 3.0(L) 2.9(L)  Hemoglobin 12.0 - 15.0 g/dL 11.0(L) 10.6(L) 10.2(L)  Hematocrit 36.0 - 46.0 % 32.7(L) 31.6(L) 30.6(L)  Platelets 150 - 400 K/uL 193 180 213   CMP Latest Ref Rng & Units 10/19/2020 10/12/2020 10/05/2020  Glucose 70 - 99 mg/dL 81 92 89  BUN 8 - 23 mg/dL _0 Creatinine 0.44 - 1.00 mg/dL 0.64 0.72 0.71  Sodium 135 - 145 mmol/L 135 135 135  Potassium 3.5 - 5.1 mmol/L 3.4(L) 3.6 3.6  Chloride 98 - 111 mmol/L 101 101 101  CO2 22 - 32 mmol/L _1 Calcium 8.9 - 10.3 mg/dL 9.1 9.1 9.4  Total Protein 6.5 - 8.1 g/dL 6.3(L) 6.1(L) 6.4(L)  Total Bilirubin 0.3 - 1.2 mg/dL 0.5 0.6 0.6  Alkaline Phos 38 - 126 U/L 63 53 52  AST 15 - 41 U/L 27 26 35  ALT 0 - 44 U/L _2 DIAGNOSTIC IMAGING:  I have independently reviewed the scans and discussed with the patient. No results found.   ASSESSMENT:  1. Stage II (T2N0) left breast TNBC: -Felt left breast mass for  5 months. -Mammogram on 05/07/2020 with suspicious mass in the 10:30 o'clock position in the left breast. Suspicious calcifications in the anterior lower inner quadrant of the left breast. -MRI on 06/05/2020 shows left breast mass measuring 3.8 x 3.3 x 3.2 cm in the upper inner quadrant. Biopsy-proven  high-grade DCIS in the anterior upper inner left breast. No suspicious lymphadenopathy. No MRI evidence of malignancy on the right. -Left breast upper inner quadrant biopsy on 05/14/2020 showed DCIS with calcifications. Right breast biopsy shows fibroadenomatoid nodule with calcifications. ER/PR was negative. -Left breast core needle biopsy at 10:30 position shows invasive ductal carcinoma. ER/PR/HER-2 was negative. Ki-67 85%. Left lymph node biopsy was reactive. -Patient was evaluated by Dr. [name] and Dr. Georgette Dover. -She received first cycle of chemotherapy with Adriamycin and cyclophosphamide with pembrolizumab ([id number] trial)added for improved PCR by about 15%. -Cycle 1 of Adriamycin, cyclophosphamide and pembrolizumab on9/10/2019.  2. Family history: -Paternal cousin and second cousin had breast cancer. Paternal grandmother also had some type of cancer. -Another paternal cousin died of pancreatic cancer. Another paternal cousin had leukemia.   PLAN:  1. Triple negative left breast cancer: -She has completed 6 weekly dose of paclitaxel and carboplatin.  Last Keytruda on 09/08/2020, held due to rash. -She has taken dexamethasone 4 mg daily for 3 days after last treatment. -Reviewed labs today.  White count and platelet count are normal.  Hemoglobin is 11.  LFTs are normal. -She will proceed with weekly carboplatin and paclitaxel.  I plan to see her back in 3 weeks for follow-up.  If the rash is improved, will restart on Keytruda at that time.  She was told to continue dexamethasone for 3 days after each treatment.  2. Family history: -Genetic testing is negative.  3.  Skin rash: -Reportedly had very mild rash since start of chemotherapy.  However the rash has increased after second dose of carboplatin and Taxol. -Maculopapular rash on the upper extremities and very small amount on the thighs. -No new rash seen.  Continue dexamethasone for 3 days after each weekly  treatment.  4. High risk drug monitoring: -Synthroid increased to 75 mcg daily on 10/13/2020. -TSH improved to 30 from 42 today.   Orders placed this encounter:  No orders of the defined types were placed in this encounter.    Derek Jack, MD [place] [phone]   I, Milinda Antis, am acting as a scribe for Dr. Sanda Linger.  I, Derek Jack MD, have reviewed the above documentation for accuracy and completeness, and I agree with the above.

## 2020-10-26 ENCOUNTER — Other Ambulatory Visit: Payer: Self-pay

## 2020-10-26 ENCOUNTER — Inpatient Hospital Stay (HOSPITAL_COMMUNITY): Payer: BC Managed Care – PPO

## 2020-10-26 DIAGNOSIS — C50812 Malignant neoplasm of overlapping sites of left female breast: Secondary | ICD-10-CM

## 2020-10-26 DIAGNOSIS — Z171 Estrogen receptor negative status [ER-]: Secondary | ICD-10-CM

## 2020-10-26 DIAGNOSIS — Z5111 Encounter for antineoplastic chemotherapy: Secondary | ICD-10-CM | POA: Diagnosis not present

## 2020-10-26 LAB — CBC WITH DIFFERENTIAL/PLATELET
Abs Immature Granulocytes: 0.01 10*3/uL (ref 0.00–0.07)
Basophils Absolute: 0 10*3/uL (ref 0.0–0.1)
Basophils Relative: 0 %
Eosinophils Absolute: 0 10*3/uL (ref 0.0–0.5)
Eosinophils Relative: 0 %
HCT: 31.8 % — ABNORMAL LOW (ref 36.0–46.0)
Hemoglobin: 10.8 g/dL — ABNORMAL LOW (ref 12.0–15.0)
Immature Granulocytes: 0 %
Lymphocytes Relative: 19 %
Lymphs Abs: 0.5 10*3/uL — ABNORMAL LOW (ref 0.7–4.0)
MCH: 37.5 pg — ABNORMAL HIGH (ref 26.0–34.0)
MCHC: 34 g/dL (ref 30.0–36.0)
MCV: 110.4 fL — ABNORMAL HIGH (ref 80.0–100.0)
Monocytes Absolute: 0.1 10*3/uL (ref 0.1–1.0)
Monocytes Relative: 3 %
Neutro Abs: 1.8 10*3/uL (ref 1.7–7.7)
Neutrophils Relative %: 78 %
Platelets: 158 10*3/uL (ref 150–400)
RBC: 2.88 MIL/uL — ABNORMAL LOW (ref 3.87–5.11)
RDW: 15.9 % — ABNORMAL HIGH (ref 11.5–15.5)
WBC: 2.3 10*3/uL — ABNORMAL LOW (ref 4.0–10.5)
nRBC: 0 % (ref 0.0–0.2)

## 2020-10-26 LAB — COMPREHENSIVE METABOLIC PANEL
ALT: 29 U/L (ref 0–44)
AST: 27 U/L (ref 15–41)
Albumin: 4 g/dL (ref 3.5–5.0)
Alkaline Phosphatase: 58 U/L (ref 38–126)
Anion gap: 6 (ref 5–15)
BUN: 16 mg/dL (ref 8–23)
CO2: 26 mmol/L (ref 22–32)
Calcium: 9.1 mg/dL (ref 8.9–10.3)
Chloride: 100 mmol/L (ref 98–111)
Creatinine, Ser: 0.63 mg/dL (ref 0.44–1.00)
GFR, Estimated: >60 mL/min (ref >60)
Glucose, Bld: 120 mg/dL — ABNORMAL HIGH (ref 70–99)
Potassium: 4.2 mmol/L (ref 3.5–5.1)
Sodium: 132 mmol/L — ABNORMAL LOW (ref 135–145)
Total Bilirubin: 0.6 mg/dL (ref 0.3–1.2)
Total Protein: 6.4 g/dL — ABNORMAL LOW (ref 6.5–8.1)

## 2020-10-26 LAB — TSH: TSH: 7.661 u[IU]/mL — ABNORMAL HIGH (ref 0.350–4.500)

## 2020-10-27 ENCOUNTER — Other Ambulatory Visit: Payer: Self-pay

## 2020-10-27 ENCOUNTER — Inpatient Hospital Stay (HOSPITAL_COMMUNITY): Payer: BC Managed Care – PPO

## 2020-10-27 VITALS — BP 111/71 | HR 77 | Temp 97.1°F | Resp 18 | Wt 158.8 lb

## 2020-10-27 DIAGNOSIS — C50812 Malignant neoplasm of overlapping sites of left female breast: Secondary | ICD-10-CM

## 2020-10-27 DIAGNOSIS — Z171 Estrogen receptor negative status [ER-]: Secondary | ICD-10-CM

## 2020-10-27 DIAGNOSIS — Z5111 Encounter for antineoplastic chemotherapy: Secondary | ICD-10-CM | POA: Diagnosis not present

## 2020-10-27 MED ORDER — SODIUM CHLORIDE 0.9% FLUSH
10.0000 mL | INTRAVENOUS | Status: DC | PRN
  Administered 2020-10-27 (×2): 10 mL

## 2020-10-27 MED ORDER — SODIUM CHLORIDE 0.9 % IV SOLN: Freq: Once | INTRAVENOUS | Status: AC

## 2020-10-27 MED ORDER — DIPHENHYDRAMINE HCL 50 MG/ML IJ SOLN
25.0000 mg | Freq: Once | INTRAMUSCULAR | Status: AC
  Administered 2020-10-27: 25 mg via INTRAVENOUS
  Filled 2020-10-27: qty 1

## 2020-10-27 MED ORDER — HEPARIN SOD (PORK) LOCK FLUSH 100 UNIT/ML IV SOLN
500.0000 [IU] | Freq: Once | INTRAVENOUS | Status: AC | PRN
  Administered 2020-10-27: 500 [IU]

## 2020-10-27 MED ORDER — PALONOSETRON HCL INJECTION 0.25 MG/5ML
0.2500 mg | Freq: Once | INTRAVENOUS | Status: AC
  Administered 2020-10-27: 0.25 mg via INTRAVENOUS
  Filled 2020-10-27: qty 5

## 2020-10-27 MED ORDER — SODIUM CHLORIDE 0.9 % IV SOLN
80.0000 mg/m2 | Freq: Once | INTRAVENOUS | Status: AC
  Administered 2020-10-27: 150 mg via INTRAVENOUS
  Filled 2020-10-27: qty 25

## 2020-10-27 MED ORDER — SODIUM CHLORIDE 0.9 % IV SOLN
134.8500 mg | Freq: Once | INTRAVENOUS | Status: AC
  Administered 2020-10-27: 130 mg via INTRAVENOUS
  Filled 2020-10-27: qty 13

## 2020-10-27 MED ORDER — FAMOTIDINE IN NACL 20-0.9 MG/50ML-% IV SOLN
20.0000 mg | Freq: Once | INTRAVENOUS | Status: AC
  Administered 2020-10-27: 20 mg via INTRAVENOUS
  Filled 2020-10-27: qty 50

## 2020-10-27 MED ORDER — SODIUM CHLORIDE 0.9 % IV SOLN
20.0000 mg | Freq: Once | INTRAVENOUS | Status: AC
  Administered 2020-10-27: 20 mg via INTRAVENOUS
  Filled 2020-10-27: qty 20

## 2020-10-27 NOTE — Progress Notes (Signed)
Patient presents today for Taxol/Carbo infusion.  Vital signs within parameters for treatment.  Labs drawn on 10/26/20 within parameters for treatment.  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 Patient states that  Dr. Delton Coombes is aware of the rash and has stopped her Keytruda infusions to see if it improves.  Patient has no new complaints at this time.  Treatment given today per MD orders.  Tolerated infusion without adverse affects.  Vital signs stable.  No complaints at this time.  Discharge from clinic ambulatory in stable condition.  Alert and oriented X 3.  Follow up with [facility] as scheduled.

## 2020-10-27 NOTE — Patient Instructions (Signed)
[facility] Discharge Instructions for Patients Receiving Chemotherapy  Today you received the following chemotherapy agents   To help prevent nausea and vomiting after your treatment, we encourage you to take your nausea medication   If you develop nausea and vomiting that is not controlled by your nausea medication, call the clinic.   BELOW ARE SYMPTOMS THAT SHOULD BE REPORTED IMMEDIATELY:  *FEVER GREATER THAN 100.5 F  *CHILLS WITH OR WITHOUT FEVER  NAUSEA AND VOMITING THAT IS NOT CONTROLLED WITH YOUR NAUSEA MEDICATION  *UNUSUAL SHORTNESS OF BREATH  *UNUSUAL BRUISING OR BLEEDING  TENDERNESS IN MOUTH AND THROAT WITH OR WITHOUT PRESENCE OF ULCERS  *URINARY PROBLEMS  *BOWEL PROBLEMS  UNUSUAL RASH Items with * indicate a potential emergency and should be followed up as soon as possible.  Feel free to call the clinic should you have any questions or concerns. The clinic phone number is [phone].  Please show the CHEMO ALERT CARD at check-in to the Emergency Department and triage nurse.   

## 2020-11-02 ENCOUNTER — Inpatient Hospital Stay (HOSPITAL_COMMUNITY): Payer: BC Managed Care – PPO

## 2020-11-02 ENCOUNTER — Other Ambulatory Visit (HOSPITAL_COMMUNITY): Payer: BC Managed Care – PPO

## 2020-11-02 ENCOUNTER — Other Ambulatory Visit: Payer: Self-pay

## 2020-11-02 DIAGNOSIS — Z171 Estrogen receptor negative status [ER-]: Secondary | ICD-10-CM

## 2020-11-02 DIAGNOSIS — Z5111 Encounter for antineoplastic chemotherapy: Secondary | ICD-10-CM | POA: Diagnosis not present

## 2020-11-02 DIAGNOSIS — C50812 Malignant neoplasm of overlapping sites of left female breast: Secondary | ICD-10-CM

## 2020-11-02 LAB — COMPREHENSIVE METABOLIC PANEL
ALT: 33 U/L (ref 0–44)
AST: 27 U/L (ref 15–41)
Albumin: 3.8 g/dL (ref 3.5–5.0)
Alkaline Phosphatase: 58 U/L (ref 38–126)
Anion gap: 7 (ref 5–15)
BUN: 15 mg/dL (ref 8–23)
CO2: 28 mmol/L (ref 22–32)
Calcium: 9.2 mg/dL (ref 8.9–10.3)
Chloride: 100 mmol/L (ref 98–111)
Creatinine, Ser: 0.69 mg/dL (ref 0.44–1.00)
GFR, Estimated: >60 mL/min (ref >60)
Glucose, Bld: 85 mg/dL (ref 70–99)
Potassium: 3.6 mmol/L (ref 3.5–5.1)
Sodium: 135 mmol/L (ref 135–145)
Total Bilirubin: 0.4 mg/dL (ref 0.3–1.2)
Total Protein: 6.1 g/dL — ABNORMAL LOW (ref 6.5–8.1)

## 2020-11-02 LAB — CBC WITH DIFFERENTIAL/PLATELET
Abs Immature Granulocytes: 0.01 10*3/uL (ref 0.00–0.07)
Basophils Absolute: 0 10*3/uL (ref 0.0–0.1)
Basophils Relative: 1 %
Eosinophils Absolute: 0 10*3/uL (ref 0.0–0.5)
Eosinophils Relative: 0 %
HCT: 33.8 % — ABNORMAL LOW (ref 36.0–46.0)
Hemoglobin: 11.2 g/dL — ABNORMAL LOW (ref 12.0–15.0)
Immature Granulocytes: 0 %
Lymphocytes Relative: 34 %
Lymphs Abs: 0.9 10*3/uL (ref 0.7–4.0)
MCH: 36.8 pg — ABNORMAL HIGH (ref 26.0–34.0)
MCHC: 33.1 g/dL (ref 30.0–36.0)
MCV: 111.2 fL — ABNORMAL HIGH (ref 80.0–100.0)
Monocytes Absolute: 0.2 10*3/uL (ref 0.1–1.0)
Monocytes Relative: 8 %
Neutro Abs: 1.5 10*3/uL — ABNORMAL LOW (ref 1.7–7.7)
Neutrophils Relative %: 57 %
Platelets: 152 10*3/uL (ref 150–400)
RBC: 3.04 MIL/uL — ABNORMAL LOW (ref 3.87–5.11)
RDW: 15.8 % — ABNORMAL HIGH (ref 11.5–15.5)
WBC: 2.7 10*3/uL — ABNORMAL LOW (ref 4.0–10.5)
nRBC: 0 % (ref 0.0–0.2)

## 2020-11-02 LAB — TSH: TSH: 26.818 u[IU]/mL — ABNORMAL HIGH (ref 0.350–4.500)

## 2020-11-03 ENCOUNTER — Encounter (HOSPITAL_COMMUNITY): Payer: Self-pay

## 2020-11-03 ENCOUNTER — Inpatient Hospital Stay (HOSPITAL_COMMUNITY): Payer: BC Managed Care – PPO

## 2020-11-03 ENCOUNTER — Other Ambulatory Visit: Payer: Self-pay

## 2020-11-03 VITALS — BP 117/72 | HR 72 | Temp 97.1°F | Resp 17 | Wt 156.8 lb

## 2020-11-03 DIAGNOSIS — C50812 Malignant neoplasm of overlapping sites of left female breast: Secondary | ICD-10-CM

## 2020-11-03 DIAGNOSIS — Z171 Estrogen receptor negative status [ER-]: Secondary | ICD-10-CM

## 2020-11-03 DIAGNOSIS — Z5111 Encounter for antineoplastic chemotherapy: Secondary | ICD-10-CM | POA: Diagnosis not present

## 2020-11-03 MED ORDER — DIPHENHYDRAMINE HCL 50 MG/ML IJ SOLN
INTRAMUSCULAR | Status: AC
  Filled 2020-11-03: qty 1

## 2020-11-03 MED ORDER — SODIUM CHLORIDE 0.9 % IV SOLN
80.0000 mg/m2 | Freq: Once | INTRAVENOUS | Status: AC
  Administered 2020-11-03: 150 mg via INTRAVENOUS
  Filled 2020-11-03: qty 25

## 2020-11-03 MED ORDER — HEPARIN SOD (PORK) LOCK FLUSH 100 UNIT/ML IV SOLN
500.0000 [IU] | Freq: Once | INTRAVENOUS | Status: AC | PRN
  Administered 2020-11-03: 500 [IU]

## 2020-11-03 MED ORDER — PALONOSETRON HCL INJECTION 0.25 MG/5ML
INTRAVENOUS | Status: AC
  Filled 2020-11-03: qty 5

## 2020-11-03 MED ORDER — DIPHENHYDRAMINE HCL 50 MG/ML IJ SOLN
25.0000 mg | Freq: Once | INTRAMUSCULAR | Status: AC
  Administered 2020-11-03: 25 mg via INTRAVENOUS

## 2020-11-03 MED ORDER — SODIUM CHLORIDE 0.9 % IV SOLN: Freq: Once | INTRAVENOUS | Status: AC

## 2020-11-03 MED ORDER — PALONOSETRON HCL INJECTION 0.25 MG/5ML
0.2500 mg | Freq: Once | INTRAVENOUS | Status: AC
  Administered 2020-11-03: 0.25 mg via INTRAVENOUS

## 2020-11-03 MED ORDER — SODIUM CHLORIDE 0.9% FLUSH
10.0000 mL | INTRAVENOUS | Status: DC | PRN
  Administered 2020-11-03: 10 mL

## 2020-11-03 MED ORDER — FAMOTIDINE IN NACL 20-0.9 MG/50ML-% IV SOLN
20.0000 mg | Freq: Once | INTRAVENOUS | Status: AC
  Administered 2020-11-03: 20 mg via INTRAVENOUS

## 2020-11-03 MED ORDER — FAMOTIDINE IN NACL 20-0.9 MG/50ML-% IV SOLN
INTRAVENOUS | Status: AC
  Filled 2020-11-03: qty 50

## 2020-11-03 MED ORDER — SODIUM CHLORIDE 0.9 % IV SOLN
134.8500 mg | Freq: Once | INTRAVENOUS | Status: AC
Start: 2020-11-03 — End: 2020-11-03
  Administered 2020-11-03: 130 mg via INTRAVENOUS
  Filled 2020-11-03: qty 13

## 2020-11-03 MED ORDER — SODIUM CHLORIDE 0.9 % IV SOLN
20.0000 mg | Freq: Once | INTRAVENOUS | Status: AC
  Administered 2020-11-03: 20 mg via INTRAVENOUS
  Filled 2020-11-03: qty 20

## 2020-11-03 NOTE — Progress Notes (Signed)
Pt here for carbo/taxol today.  ANC 1.5, TSH 26.818.  Okay for treatment per verbal order from Dr Raliegh Ip. Vital signs WNL for treatment today.   Tolerated treatment well today without incidence.  Discharged in stable condition ambulatory. Vital signs stable prior to discharge.

## 2020-11-03 NOTE — Patient Instructions (Signed)
[facility] Discharge Instructions for Patients Receiving Chemotherapy  Today you received the following chemotherapy agents   To help prevent nausea and vomiting after your treatment, we encourage you to take your nausea medication   If you develop nausea and vomiting that is not controlled by your nausea medication, call the clinic.   BELOW ARE SYMPTOMS THAT SHOULD BE REPORTED IMMEDIATELY:  *FEVER GREATER THAN 100.5 F  *CHILLS WITH OR WITHOUT FEVER  NAUSEA AND VOMITING THAT IS NOT CONTROLLED WITH YOUR NAUSEA MEDICATION  *UNUSUAL SHORTNESS OF BREATH  *UNUSUAL BRUISING OR BLEEDING  TENDERNESS IN MOUTH AND THROAT WITH OR WITHOUT PRESENCE OF ULCERS  *URINARY PROBLEMS  *BOWEL PROBLEMS  UNUSUAL RASH Items with * indicate a potential emergency and should be followed up as soon as possible.  Feel free to call the clinic should you have any questions or concerns. The clinic phone number is [phone].  Please show the CHEMO ALERT CARD at check-in to the Emergency Department and triage nurse.   

## 2020-11-09 ENCOUNTER — Inpatient Hospital Stay (HOSPITAL_COMMUNITY): Payer: BC Managed Care – PPO

## 2020-11-09 ENCOUNTER — Other Ambulatory Visit (HOSPITAL_COMMUNITY): Payer: BC Managed Care – PPO

## 2020-11-09 ENCOUNTER — Other Ambulatory Visit: Payer: Self-pay

## 2020-11-09 DIAGNOSIS — C50812 Malignant neoplasm of overlapping sites of left female breast: Secondary | ICD-10-CM

## 2020-11-09 DIAGNOSIS — Z5111 Encounter for antineoplastic chemotherapy: Secondary | ICD-10-CM | POA: Diagnosis not present

## 2020-11-09 LAB — COMPREHENSIVE METABOLIC PANEL
ALT: 35 U/L (ref 0–44)
AST: 25 U/L (ref 15–41)
Albumin: 3.7 g/dL (ref 3.5–5.0)
Alkaline Phosphatase: 54 U/L (ref 38–126)
Anion gap: 7 (ref 5–15)
BUN: 20 mg/dL (ref 8–23)
CO2: 26 mmol/L (ref 22–32)
Calcium: 8.8 mg/dL — ABNORMAL LOW (ref 8.9–10.3)
Chloride: 99 mmol/L (ref 98–111)
Creatinine, Ser: 0.76 mg/dL (ref 0.44–1.00)
GFR, Estimated: >60 mL/min (ref >60)
Glucose, Bld: 101 mg/dL — ABNORMAL HIGH (ref 70–99)
Potassium: 3.6 mmol/L (ref 3.5–5.1)
Sodium: 132 mmol/L — ABNORMAL LOW (ref 135–145)
Total Bilirubin: 0.5 mg/dL (ref 0.3–1.2)
Total Protein: 6.1 g/dL — ABNORMAL LOW (ref 6.5–8.1)

## 2020-11-09 LAB — MAGNESIUM: Magnesium: 1.8 mg/dL (ref 1.7–2.4)

## 2020-11-09 LAB — CBC WITH DIFFERENTIAL/PLATELET
Abs Immature Granulocytes: 0.02 10*3/uL (ref 0.00–0.07)
Basophils Absolute: 0 10*3/uL (ref 0.0–0.1)
Basophils Relative: 1 %
Eosinophils Absolute: 0 10*3/uL (ref 0.0–0.5)
Eosinophils Relative: 1 %
HCT: 29.5 % — ABNORMAL LOW (ref 36.0–46.0)
Hemoglobin: 9.9 g/dL — ABNORMAL LOW (ref 12.0–15.0)
Immature Granulocytes: 1 %
Lymphocytes Relative: 43 %
Lymphs Abs: 1.1 10*3/uL (ref 0.7–4.0)
MCH: 36.7 pg — ABNORMAL HIGH (ref 26.0–34.0)
MCHC: 33.6 g/dL (ref 30.0–36.0)
MCV: 109.3 fL — ABNORMAL HIGH (ref 80.0–100.0)
Monocytes Absolute: 0.2 10*3/uL (ref 0.1–1.0)
Monocytes Relative: 9 %
Neutro Abs: 1.2 10*3/uL — ABNORMAL LOW (ref 1.7–7.7)
Neutrophils Relative %: 45 %
Platelets: 148 10*3/uL — ABNORMAL LOW (ref 150–400)
RBC: 2.7 MIL/uL — ABNORMAL LOW (ref 3.87–5.11)
RDW: 15.4 % (ref 11.5–15.5)
WBC: 2.6 10*3/uL — ABNORMAL LOW (ref 4.0–10.5)
nRBC: 0 % (ref 0.0–0.2)

## 2020-11-09 LAB — TSH: TSH: 24.963 u[IU]/mL — ABNORMAL HIGH (ref 0.350–4.500)

## 2020-11-09 MED FILL — Dexamethasone Sodium Phosphate Inj 100 MG/10ML: INTRAMUSCULAR | Qty: 1 | Status: AC

## 2020-11-10 ENCOUNTER — Other Ambulatory Visit: Payer: Self-pay

## 2020-11-10 ENCOUNTER — Inpatient Hospital Stay (HOSPITAL_BASED_OUTPATIENT_CLINIC_OR_DEPARTMENT_OTHER): Payer: BC Managed Care – PPO | Admitting: Hematology

## 2020-11-10 ENCOUNTER — Inpatient Hospital Stay (HOSPITAL_COMMUNITY): Payer: BC Managed Care – PPO

## 2020-11-10 ENCOUNTER — Encounter (HOSPITAL_COMMUNITY): Payer: Self-pay | Admitting: Hematology

## 2020-11-10 VITALS — BP 125/65 | HR 79 | Temp 96.9°F | Resp 18 | Wt 158.0 lb

## 2020-11-10 DIAGNOSIS — Z171 Estrogen receptor negative status [ER-]: Secondary | ICD-10-CM

## 2020-11-10 DIAGNOSIS — C50812 Malignant neoplasm of overlapping sites of left female breast: Secondary | ICD-10-CM | POA: Diagnosis not present

## 2020-11-10 DIAGNOSIS — Z5111 Encounter for antineoplastic chemotherapy: Secondary | ICD-10-CM | POA: Diagnosis not present

## 2020-11-10 DIAGNOSIS — D702 Other drug-induced agranulocytosis: Secondary | ICD-10-CM | POA: Insufficient documentation

## 2020-11-10 MED ORDER — FILGRASTIM-SNDZ 300 MCG/0.5ML IJ SOSY: 300.0000 ug | PREFILLED_SYRINGE | Freq: Once | INTRAMUSCULAR | Status: DC

## 2020-11-10 MED ORDER — TBO-FILGRASTIM 300 MCG/0.5ML ~~LOC~~ SOSY: 300.0000 ug | PREFILLED_SYRINGE | Freq: Once | SUBCUTANEOUS | Status: DC

## 2020-11-10 NOTE — Progress Notes (Signed)
Patient was assessed by Dr. Delton Coombes and labs have been reviewed.  Patient is not getting treatment today due to rash, neuropathy and decreased WBC. Treatment will resume on Friday if WBC increase from Granix injection today. No Keytruda tomorrow. Primary RN and pharmacy aware.

## 2020-11-10 NOTE — Progress Notes (Signed)
[place], [place] [address]   CLINIC:  Medical Oncology/Hematology  PCP:  Roderic Scarce, MD [address] 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 Mammogram and US showed a 2.7cm mass at the 10:30 position, one left axillary lymph node with cortical thickening, calcifications in the lower inner left breast, and a 0.4cm group of calcifications in the right breast. Biopsy on 05/14/20 showed no malignancy in the right breast, and in the left breast, DCIS, high grade, ER/PR negative. 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    06/16/2020 -  Chemotherapy    Patient is on Treatment Plan: BREAST AC Q21D / CARBOPLATIN D1 + PACLITAXEL D1,8,15 Q21D      09/22/2020 Genetic Testing   Negative genetic testing. No pathogenic variants identified on the [facility] Common Hereditary Cancers Panel. The report date is 09/22/2020.  The Common Hereditary Cancers Panel offered by [facility] includes sequencing and/or deletion duplication testing of the following 48 genes: APC, ATM, AXIN2, BARD1, BMPR1A, BRCA1, BRCA2, BRIP1, CDH1, CDKN2A (p14ARF), CDKN2A (p16INK4a), CKD4, CHEK2, CTNNA1, DICER1, EPCAM (Deletion/duplication testing  only), GREM1 (promoter region deletion/duplication testing only), KIT, MEN1, MLH1, MSH2, MSH3, MSH6, MUTYH, NBN, NF1, NHTL1, PALB2, PDGFRA, PMS2, POLD1, POLE, PTEN, RAD50, RAD51C, RAD51D, RNF43, SDHB, SDHC, SDHD, SMAD4, SMARCA4. STK11, TP53, TSC1, TSC2, and VHL.  The following genes were evaluated for sequence changes only: SDHA and HOXB13 c.251G>A variant only.     CANCER STAGING: Cancer Staging Malignant neoplasm of overlapping sites of left breast in female, estrogen receptor negative ([place]) Staging form: Breast, AJCC 8th Edition - Clinical stage from 05/17/2020: Stage IIB (cT2, cN0, cM0, G3, ER-, PR-, HER2-) - Signed by Gardenia Phlegm, NP on 05/26/2020   INTERVAL HISTORY:  [name], a 70 y.o. female, returns for routine follow-up and consideration for next cycle of chemotherapy. Silver was last seen on 10/20/2020.  Due for cycle #8 of carboplatin and paclitaxel on Friday.   Today she is accompanied by her husband. Overall, she tells me she has been feeling pretty well. She continues having an rash on her hands, distal forearms and medial knees; the rash hurts if she presses on them. She also reports having pain on her elbows but without the rash. 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 She continues taking Decadron 4 mg for 3 days after getting treatment. She has numbness constantly in her feet and waxing and waning in her fingertips; she continues running and walking and staying active. She is taking Synthroid daily and does not eat for an hour. Her appetite is excellent and takes Miralax for her constipation, but denies having nausea or diarrhea; she continues having xerostomia.  She denies having any new cough.   Overall, she feels ready for next cycle of chemo on Friday.    REVIEW OF SYSTEMS:  Review of Systems  Constitutional: Positive for appetite change and fatigue.  Respiratory: Negative for cough.   Gastrointestinal:  Positive for constipation (on Miralax). Negative for diarrhea and nausea.  Musculoskeletal: Positive for arthralgias (elbows & hips) and back pain.  Skin: Positive for rash (painful rash on hands, distal forearms and medial knees).  Neurological: Positive for numbness (constant in feet; waxing and waning in fingertips).  All other systems reviewed and are negative.   PAST MEDICAL/SURGICAL HISTORY:  Past Medical History:  Diagnosis Date  . Allergy   . Breast cancer ([place])   . Cancer ([place])   . Diverticulitis   . Family history of breast cancer 08/17/2020  . Family history of pancreatic cancer 08/17/2020  . GERD (gastroesophageal reflux disease)   . Hypertension   . Skin cancer    Past Surgical History:  Procedure Laterality Date  . COLONOSCOPY    . PORTACATH PLACEMENT N/A 06/15/2020   Procedure: INSERTION PORT-A-CATH WITH ULTRASOUND GUIDANCE;  Surgeon: Donnie Mesa, MD;  Location: [facility];  Service: General;  Laterality: Right;  LMA, LEAVE PORT ACCESSED-CHEMO START 9/1    SOCIAL HISTORY:  Social History   Socioeconomic History  . Marital status: Married    Spouse name: Not on file  . Number of children: Not on file  . Years of education: Not on file  . Highest education level: Not on file  Occupational History  . Occupation: Naval architect  Tobacco Use  . Smoking status: Never Smoker  . Smokeless tobacco: Never Used  Substance and Sexual Activity  . Alcohol use: Not Currently  . Drug use: Never  . Sexual activity: Not on file  Other Topics Concern  . Not on file  Social History Narrative  . Not on file   Social Determinants of Health   Financial Resource Strain: Low Risk   . Difficulty of Paying Living Expenses: Not hard at all  Food Insecurity: No Food Insecurity  . Worried About Charity fundraiser in the Last Year: Never true  . Ran Out of Food in the Last Year: Never true  Transportation Needs: No Transportation Needs  . Lack of  Transportation (Medical): No  . Lack of Transportation (Non-Medical): No  Physical Activity: Sufficiently Active  . Days of Exercise per Week: 7 days  . Minutes of Exercise per Session: 60 min  Stress: Stress Concern Present  . Feeling of Stress : To some extent  Social Connections: Moderately Integrated  . Frequency of Communication with Friends and Family: Once a week  . Frequency of Social Gatherings with Friends and Family: Never  . Attends Religious Services: More than 4 times per year  . Active Member of Clubs or Organizations: Yes  . Attends Archivist Meetings: 1 to 4 times per year  . Marital Status: Married  Human resources officer Violence: Not At Risk  . Fear of Current or Ex-Partner: No  . Emotionally Abused: No  . Physically Abused: No  . Sexually Abused: No    FAMILY HISTORY:  Family History  Problem Relation Age of Onset  . Diverticulitis Mother   . Hypertension Maternal Aunt   . Hypertension Maternal Uncle   . Cancer Paternal Grandmother        unknown type; d. 38s  . Breast cancer Cousin 37  paternal   . Pancreatic cancer Cousin        paternal; dx 54s    CURRENT MEDICATIONS:  Current Outpatient Medications  Medication Sig Dispense Refill  . amLODipine-benazepril (LOTREL) 5-20 MG capsule Take 1 capsule by mouth daily.    . Bismuth Subsalicylate (PEPTO-BISMOL PO) Take by mouth as needed.    Marland Kitchen dexamethasone (DECADRON) 4 MG tablet Take 1 tablet daily for 3 days after chemo with food 30 tablet 1  . fexofenadine (ALLEGRA) 180 MG tablet Take 180 mg by mouth daily.    . fluconazole (DIFLUCAN) 100 MG tablet Take 1 tablet (100 mg total) by mouth daily. 7 tablet 0  . ketoconazole (NIZORAL) 2 % cream Apply 1 application topically daily. 30 g 1  . Lactobacillus-Inulin (CULTURELLE DIGESTIVE DAILY) CAPS Take 1 capsule by mouth daily.    Marland Kitchen levothyroxine (SYNTHROID) 75 MCG tablet Take 1 tablet (75 mcg total) by mouth daily before breakfast. 30 tablet 1  .  lidocaine-prilocaine (EMLA) cream SMARTSIG:1 Topical Every Night    . LORazepam (ATIVAN) 0.5 MG tablet Take 1 tablet (0.5 mg total) by mouth at bedtime as needed for sleep. 30 tablet 0  . magic mouthwash SOLN Take 5 mLs by mouth 3 (three) times daily as needed for mouth pain. 450 mL 0  . omeprazole (PRILOSEC) 20 MG capsule Take 20 mg by mouth daily.    . ondansetron (ZOFRAN) 8 MG tablet Take 1 tablet (8 mg total) by mouth 2 (two) times daily as needed. Start on the third day after chemotherapy. 30 tablet 1  . prochlorperazine (COMPAZINE) 10 MG tablet Take 1 tablet (10 mg total) by mouth every 6 (six) hours as needed (Nausea or vomiting). 30 tablet 1   Current Facility-Administered Medications  Medication Dose Route Frequency Provider Last Rate Last Admin  . Tbo-Filgrastim (GRANIX) injection 300 mcg  300 mcg Subcutaneous Once Derek Jack, MD       Facility-Administered Medications Ordered in Other Visits  Medication Dose Route Frequency Provider Last Rate Last Admin  . sodium chloride flush (NS) 0.9 % injection 10 mL  10 mL Intracatheter PRN Ladell Pier, MD        ALLERGIES:  Allergies  Allergen Reactions  . Grapefruit Extract Other (See Comments)    Other Reaction: Other reaction  . Pseudoephedrine Hcl Other (See Comments)    Other Reaction: Other reaction  . Nickel   . Pineapple   . Amoxicillin Rash    PHYSICAL EXAM:  Performance status (ECOG): 1 - Symptomatic but completely ambulatory  Vitals:   11/10/20 1238  BP: 125/65  Pulse: 79  Resp: 18  Temp: (!) 96.9 F (36.1 C)  SpO2: 98%   Wt Readings from Last 3 Encounters:  11/10/20 158 lb (71.7 kg)  11/03/20 156 lb 12.8 oz (71.1 kg)  10/27/20 158 lb 12.8 oz (72 kg)   Physical Exam Vitals reviewed.  Constitutional:      Appearance: Normal appearance.  Cardiovascular:     Rate and Rhythm: Normal rate and regular rhythm.     Pulses: Normal pulses.     Heart sounds: Normal heart sounds.  Pulmonary:      Effort: Pulmonary effort is normal.     Breath sounds: Normal breath sounds.  Chest:     Comments: Port-a-Cath in R chest Musculoskeletal:     Right lower leg: No edema.     Left lower leg: No edema.  Skin:    Findings: Rash present. Rash is macular and  papular (maculopapular rash on posterior hands, distal forearms and medial knees).  Neurological:     General: No focal deficit present.     Mental Status: She is alert and oriented to person, place, and time.  Psychiatric:        Mood and Affect: Mood normal.        Behavior: Behavior normal.     LABORATORY DATA:  I have reviewed the labs as listed.  CBC Latest Ref Rng & Units 11/09/2020 11/02/2020 10/26/2020  WBC 4.0 - 10.5 K/uL 2.6(L) 2.7(L) 2.3(L)  Hemoglobin 12.0 - 15.0 g/dL 9.9(L) 11.2(L) 10.8(L)  Hematocrit 36.0 - 46.0 % 29.5(L) 33.8(L) 31.8(L)  Platelets 150 - 400 K/uL 148(L) 152 158   CMP Latest Ref Rng & Units 11/09/2020 11/02/2020 10/26/2020  Glucose 70 - 99 mg/dL 101(H) 85 120(H)  BUN 8 - 23 mg/dL _0 Creatinine 0.44 - 1.00 mg/dL 0.76 0.69 0.63  Sodium 135 - 145 mmol/L 132(L) 135 132(L)  Potassium 3.5 - 5.1 mmol/L 3.6 3.6 4.2  Chloride 98 - 111 mmol/L 99 100 100  CO2 22 - 32 mmol/L _1 Calcium 8.9 - 10.3 mg/dL 8.8(L) 9.2 9.1  Total Protein 6.5 - 8.1 g/dL 6.1(L) 6.1(L) 6.4(L)  Total Bilirubin 0.3 - 1.2 mg/dL 0.5 0.4 0.6  Alkaline Phos 38 - 126 U/L 54 58 58  AST 15 - 41 U/L _2 ALT 0 - 44 U/L 35 33 29    DIAGNOSTIC IMAGING:  I have independently reviewed the scans and discussed with the patient. No results found.   ASSESSMENT:  1. Stage II (T2N0) left breast TNBC: -Felt left breast mass for 5 months. -Mammogram on 05/07/2020 with suspicious mass in the 10:30 o'clock position in the left breast. Suspicious calcifications in the anterior lower inner quadrant of the left breast. -MRI on 06/05/2020 shows left breast mass measuring 3.8 x 3.3 x 3.2 cm in the upper inner quadrant. Biopsy-proven  high-grade DCIS in the anterior upper inner left breast. No suspicious lymphadenopathy. No MRI evidence of malignancy on the right. -Left breast upper inner quadrant biopsy on 05/14/2020 showed DCIS with calcifications. Right breast biopsy shows fibroadenomatoid nodule with calcifications. ER/PR was negative. -Left breast core needle biopsy at 10:30 position shows invasive ductal carcinoma. ER/PR/HER-2 was negative. Ki-67 85%. Left lymph node biopsy was reactive. -Patient was evaluated by Dr. [name] and Dr. Georgette Dover. -She received first cycle of chemotherapy with Adriamycin and cyclophosphamide with pembrolizumab (keynote-522 trial)added for improved PCR by about 15%. -Cycle 1 of Adriamycin, cyclophosphamide and pembrolizumab on9/10/2019.  2. Family history: -Paternal cousin and second cousin had breast cancer. Paternal grandmother also had some type of cancer. -Another paternal cousin died of pancreatic cancer. Another paternal cousin had leukemia.   PLAN:  1. Triple negative left breast cancer: -She has completed 9 weekly doses of paclitaxel and carboplatin.  Last Keytruda on 09/08/2020. -Reviewed labs from today.  White count is 2.6 with ANC of 1.2. -Recommend holding chemotherapy today and growth factor support. -Patient would like to skip this week of treatment and come back next week.  We will check her counts on Tuesday and see if she needs any G-CSF.  Paclitaxel will be dose reduced by 20%. -I will see her back in 2 to 3 weeks for follow-up. -She will follow up with Dr. Vonna Kotyk office for MRI upon completion of chemotherapy.  2. Family history: -Genetic testing is negative.  3.Skin rash: -She has erythematous maculopapular rash on  the upper extremities.  Rash has spread to the bilateral elbows and bilateral medial knee joints. -She is taking dexamethasone 4 mg for 3 days after each treatment. -Most likely paclitaxel related as he she had not received any Keytruda  since November. -I have recommended her to increase dexamethasone 4 mg 2 daily.  4. High risk drug monitoring: -She is taking Synthroid 75 mcg daily which was increased on 10/13/2020. -TSH improved to 7.6 on 10/26/2020 but went up to 24 on 11/09/2020. -We will closely monitor and will not make any dose changes at this time.  5.  Neuropathy: -She has developed tingling or numbness in the feet and fingertips. -I will cut back on the dose of paclitaxel by 20%.   Orders placed this encounter:  Orders Placed This Encounter  Procedures  . CBC with Differential/Platelet  . Comprehensive metabolic panel  . Magnesium  . TSH     Derek Jack, MD [place] [phone]   I, Milinda Antis, am acting as a scribe for Dr. Sanda Linger.  I, Derek Jack MD, have reviewed the above documentation for accuracy and completeness, and I agree with the above.

## 2020-11-10 NOTE — Progress Notes (Signed)
Patient presents today for Keytruda/Taxol/Carbo infusions.  Labs drawn on 11/09/20 reviewed, ANC noted to be 1.2.  Vital signs within parameters for treatment.  Patient complains of red stinging rash to the bilateral hand and arms, neuropathy to her bilateral feet that is worse than usual, and  she also complains of discoloration to her fingernails.  Instructed the patient to express all of these concerns to Dr. Delton Coombes during her visit.  Message received from [place] [facility]/Dr.Katragadda to Hold treatment today and give Granix injection.   Patient opted to come back Tuesday February 1st to have lab work done and possible Granix injection then Wednesday February 2nd for scheduled treatment.  Dr. Delton Coombes agreed to this plan.  Patient discharged in ambulatory, stable condition to follow up next week.

## 2020-11-10 NOTE — Patient Instructions (Addendum)
[place] at [facility] Discharge Instructions  You were seen today by Dr. Delton Coombes. He went over your recent results. You received your booster injection today to boost your white blood cell count; take Advil at home if you develop bone pain after the injection. You will receive your treatment on Friday, 1/28; continue getting your weekly treatment. Start taking the Decadron 4 mg daily. Dr. Delton Coombes will see you back in 2 weeks for labs and follow up.   Thank you for choosing [place] at [facility] to provide your oncology and hematology care.  To afford each patient quality time with our provider, please arrive at least 15 minutes before your scheduled appointment time.   If you have a lab appointment with the [facility] please come in thru the Main Entrance and check in at the main information desk  You need to re-schedule your appointment should you arrive 10 or more minutes late.  We strive to give you quality time with our providers, and arriving late affects you and other patients whose appointments are after yours.  Also, if you no show three or more times for appointments you may be dismissed from the clinic at the providers discretion.     Again, thank you for choosing [facility].  Our hope is that these requests will decrease the amount of time that you wait before being seen by our physicians.       _____________________________________________________________  Should you have questions after your visit to [facility], please contact our office at [phone] between the hours of 8:00 a.m. and 4:30 p.m.  Voicemails left after 4:00 p.m. will not be returned until the following business day.  For prescription refill requests, have your pharmacy contact our office and allow 72 hours.    Cancer Center Support Programs:   > Cancer Support Group  2nd Tuesday of the month 1pm-2pm, Journey Room

## 2020-11-10 NOTE — Patient Instructions (Signed)
[facility] Discharge Instructions for Patients Receiving Chemotherapy  Today you received the following chemotherapy agents   To help prevent nausea and vomiting after your treatment, we encourage you to take your nausea medication   If you develop nausea and vomiting that is not controlled by your nausea medication, call the clinic.   BELOW ARE SYMPTOMS THAT SHOULD BE REPORTED IMMEDIATELY:  *FEVER GREATER THAN 100.5 F  *CHILLS WITH OR WITHOUT FEVER  NAUSEA AND VOMITING THAT IS NOT CONTROLLED WITH YOUR NAUSEA MEDICATION  *UNUSUAL SHORTNESS OF BREATH  *UNUSUAL BRUISING OR BLEEDING  TENDERNESS IN MOUTH AND THROAT WITH OR WITHOUT PRESENCE OF ULCERS  *URINARY PROBLEMS  *BOWEL PROBLEMS  UNUSUAL RASH Items with * indicate a potential emergency and should be followed up as soon as possible.  Feel free to call the clinic should you have any questions or concerns. The clinic phone number is [phone].  Please show the CHEMO ALERT CARD at check-in to the Emergency Department and triage nurse.   

## 2020-11-11 ENCOUNTER — Other Ambulatory Visit (HOSPITAL_COMMUNITY): Payer: BC Managed Care – PPO

## 2020-11-12 ENCOUNTER — Ambulatory Visit (HOSPITAL_COMMUNITY): Payer: BC Managed Care – PPO

## 2020-11-16 ENCOUNTER — Encounter (HOSPITAL_COMMUNITY): Payer: Self-pay

## 2020-11-16 ENCOUNTER — Inpatient Hospital Stay (HOSPITAL_COMMUNITY): Payer: BC Managed Care – PPO | Attending: Hematology

## 2020-11-16 ENCOUNTER — Inpatient Hospital Stay (HOSPITAL_COMMUNITY): Payer: BC Managed Care – PPO

## 2020-11-16 ENCOUNTER — Other Ambulatory Visit: Payer: Self-pay

## 2020-11-16 VITALS — BP 128/79 | HR 75 | Temp 97.2°F | Resp 18

## 2020-11-16 DIAGNOSIS — D702 Other drug-induced agranulocytosis: Secondary | ICD-10-CM | POA: Insufficient documentation

## 2020-11-16 DIAGNOSIS — Z8249 Family history of ischemic heart disease and other diseases of the circulatory system: Secondary | ICD-10-CM | POA: Insufficient documentation

## 2020-11-16 DIAGNOSIS — R200 Anesthesia of skin: Secondary | ICD-10-CM | POA: Diagnosis not present

## 2020-11-16 DIAGNOSIS — Z880 Allergy status to penicillin: Secondary | ICD-10-CM | POA: Diagnosis not present

## 2020-11-16 DIAGNOSIS — N6311 Unspecified lump in the right breast, upper outer quadrant: Secondary | ICD-10-CM | POA: Insufficient documentation

## 2020-11-16 DIAGNOSIS — Z8379 Family history of other diseases of the digestive system: Secondary | ICD-10-CM | POA: Insufficient documentation

## 2020-11-16 DIAGNOSIS — R197 Diarrhea, unspecified: Secondary | ICD-10-CM | POA: Diagnosis not present

## 2020-11-16 DIAGNOSIS — T451X5A Adverse effect of antineoplastic and immunosuppressive drugs, initial encounter: Secondary | ICD-10-CM | POA: Diagnosis not present

## 2020-11-16 DIAGNOSIS — Z171 Estrogen receptor negative status [ER-]: Secondary | ICD-10-CM | POA: Insufficient documentation

## 2020-11-16 DIAGNOSIS — C50812 Malignant neoplasm of overlapping sites of left female breast: Secondary | ICD-10-CM | POA: Diagnosis present

## 2020-11-16 DIAGNOSIS — Z79899 Other long term (current) drug therapy: Secondary | ICD-10-CM | POA: Diagnosis not present

## 2020-11-16 DIAGNOSIS — K5900 Constipation, unspecified: Secondary | ICD-10-CM | POA: Diagnosis not present

## 2020-11-16 DIAGNOSIS — Z800 Family history of malignant neoplasm of digestive organs: Secondary | ICD-10-CM | POA: Insufficient documentation

## 2020-11-16 DIAGNOSIS — Z803 Family history of malignant neoplasm of breast: Secondary | ICD-10-CM | POA: Insufficient documentation

## 2020-11-16 DIAGNOSIS — Z809 Family history of malignant neoplasm, unspecified: Secondary | ICD-10-CM | POA: Insufficient documentation

## 2020-11-16 DIAGNOSIS — R21 Rash and other nonspecific skin eruption: Secondary | ICD-10-CM | POA: Insufficient documentation

## 2020-11-16 DIAGNOSIS — Z5111 Encounter for antineoplastic chemotherapy: Secondary | ICD-10-CM | POA: Diagnosis present

## 2020-11-16 DIAGNOSIS — N6314 Unspecified lump in the right breast, lower inner quadrant: Secondary | ICD-10-CM | POA: Diagnosis not present

## 2020-11-16 LAB — COMPREHENSIVE METABOLIC PANEL
ALT: 25 U/L (ref 0–44)
AST: 25 U/L (ref 15–41)
Albumin: 3.6 g/dL (ref 3.5–5.0)
Alkaline Phosphatase: 60 U/L (ref 38–126)
Anion gap: 6 (ref 5–15)
BUN: 13 mg/dL (ref 8–23)
CO2: 27 mmol/L (ref 22–32)
Calcium: 8.9 mg/dL (ref 8.9–10.3)
Chloride: 104 mmol/L (ref 98–111)
Creatinine, Ser: 0.67 mg/dL (ref 0.44–1.00)
GFR, Estimated: >60 mL/min (ref >60)
Glucose, Bld: 88 mg/dL (ref 70–99)
Potassium: 3.7 mmol/L (ref 3.5–5.1)
Sodium: 137 mmol/L (ref 135–145)
Total Bilirubin: 0.6 mg/dL (ref 0.3–1.2)
Total Protein: 6 g/dL — ABNORMAL LOW (ref 6.5–8.1)

## 2020-11-16 LAB — CBC WITH DIFFERENTIAL/PLATELET
Basophils Absolute: 0 10*3/uL (ref 0.0–0.1)
Basophils Relative: 1 %
Eosinophils Absolute: 0 10*3/uL (ref 0.0–0.5)
Eosinophils Relative: 0 %
HCT: 31.5 % — ABNORMAL LOW (ref 36.0–46.0)
Hemoglobin: 10.5 g/dL — ABNORMAL LOW (ref 12.0–15.0)
Lymphocytes Relative: 44 %
Lymphs Abs: 1.2 10*3/uL (ref 0.7–4.0)
MCH: 36.5 pg — ABNORMAL HIGH (ref 26.0–34.0)
MCHC: 33.3 g/dL (ref 30.0–36.0)
MCV: 109.4 fL — ABNORMAL HIGH (ref 80.0–100.0)
Monocytes Absolute: 0.2 10*3/uL (ref 0.1–1.0)
Monocytes Relative: 7 %
Neutro Abs: 1.3 10*3/uL — ABNORMAL LOW (ref 1.7–7.7)
Neutrophils Relative %: 48 %
Platelets: 150 10*3/uL (ref 150–400)
RBC: 2.88 MIL/uL — ABNORMAL LOW (ref 3.87–5.11)
RDW: 15.2 % (ref 11.5–15.5)
WBC: 2.7 10*3/uL — ABNORMAL LOW (ref 4.0–10.5)
nRBC: 0 % (ref 0.0–0.2)

## 2020-11-16 MED ORDER — FILGRASTIM-SNDZ 300 MCG/0.5ML IJ SOSY
300.0000 ug | PREFILLED_SYRINGE | Freq: Once | INTRAMUSCULAR | Status: AC
  Administered 2020-11-16: 300 ug via SUBCUTANEOUS
  Filled 2020-11-16: qty 0.5

## 2020-11-17 ENCOUNTER — Inpatient Hospital Stay (HOSPITAL_COMMUNITY): Payer: BC Managed Care – PPO

## 2020-11-17 ENCOUNTER — Other Ambulatory Visit: Payer: Self-pay

## 2020-11-17 VITALS — BP 113/67 | HR 68 | Temp 97.0°F | Resp 18 | Wt 159.2 lb

## 2020-11-17 DIAGNOSIS — Z5111 Encounter for antineoplastic chemotherapy: Secondary | ICD-10-CM | POA: Diagnosis not present

## 2020-11-17 DIAGNOSIS — C50812 Malignant neoplasm of overlapping sites of left female breast: Secondary | ICD-10-CM

## 2020-11-17 DIAGNOSIS — Z171 Estrogen receptor negative status [ER-]: Secondary | ICD-10-CM

## 2020-11-17 LAB — CBC WITH DIFFERENTIAL/PLATELET
Abs Immature Granulocytes: 0.09 10*3/uL — ABNORMAL HIGH (ref 0.00–0.07)
Basophils Absolute: 0 10*3/uL (ref 0.0–0.1)
Basophils Relative: 0 %
Eosinophils Absolute: 0 10*3/uL (ref 0.0–0.5)
Eosinophils Relative: 0 %
HCT: 34.3 % — ABNORMAL LOW (ref 36.0–46.0)
Hemoglobin: 11.3 g/dL — ABNORMAL LOW (ref 12.0–15.0)
Immature Granulocytes: 1 %
Lymphocytes Relative: 26 %
Lymphs Abs: 1.8 10*3/uL (ref 0.7–4.0)
MCH: 36.8 pg — ABNORMAL HIGH (ref 26.0–34.0)
MCHC: 32.9 g/dL (ref 30.0–36.0)
MCV: 111.7 fL — ABNORMAL HIGH (ref 80.0–100.0)
Monocytes Absolute: 0.5 10*3/uL (ref 0.1–1.0)
Monocytes Relative: 8 %
Neutro Abs: 4.4 10*3/uL (ref 1.7–7.7)
Neutrophils Relative %: 65 %
Platelets: 157 10*3/uL (ref 150–400)
RBC: 3.07 MIL/uL — ABNORMAL LOW (ref 3.87–5.11)
RDW: 15.5 % (ref 11.5–15.5)
WBC: 6.9 10*3/uL (ref 4.0–10.5)
nRBC: 0 % (ref 0.0–0.2)

## 2020-11-17 MED ORDER — SODIUM CHLORIDE 0.9 % IV SOLN
64.0000 mg/m2 | Freq: Once | INTRAVENOUS | Status: AC
  Administered 2020-11-17: 120 mg via INTRAVENOUS
  Filled 2020-11-17: qty 20

## 2020-11-17 MED ORDER — FAMOTIDINE IN NACL 20-0.9 MG/50ML-% IV SOLN
20.0000 mg | Freq: Once | INTRAVENOUS | Status: AC
  Administered 2020-11-17: 20 mg via INTRAVENOUS
  Filled 2020-11-17: qty 50

## 2020-11-17 MED ORDER — DIPHENHYDRAMINE HCL 50 MG/ML IJ SOLN
25.0000 mg | Freq: Once | INTRAMUSCULAR | Status: AC
  Administered 2020-11-17: 25 mg via INTRAVENOUS
  Filled 2020-11-17: qty 1

## 2020-11-17 MED ORDER — SODIUM CHLORIDE 0.9 % IV SOLN
10.0000 mg | Freq: Once | INTRAVENOUS | Status: AC
  Administered 2020-11-17: 10 mg via INTRAVENOUS
  Filled 2020-11-17: qty 1

## 2020-11-17 MED ORDER — HEPARIN SOD (PORK) LOCK FLUSH 100 UNIT/ML IV SOLN
500.0000 [IU] | Freq: Once | INTRAVENOUS | Status: AC | PRN
  Administered 2020-11-17: 500 [IU]

## 2020-11-17 MED ORDER — PALONOSETRON HCL INJECTION 0.25 MG/5ML
0.2500 mg | Freq: Once | INTRAVENOUS | Status: AC
  Administered 2020-11-17: 0.25 mg via INTRAVENOUS
  Filled 2020-11-17: qty 5

## 2020-11-17 MED ORDER — SODIUM CHLORIDE 0.9 % IV SOLN: Freq: Once | INTRAVENOUS | Status: AC

## 2020-11-17 MED ORDER — SODIUM CHLORIDE 0.9 % IV SOLN
134.8500 mg | Freq: Once | INTRAVENOUS | Status: AC
  Administered 2020-11-17: 130 mg via INTRAVENOUS
  Filled 2020-11-17: qty 13

## 2020-11-17 MED ORDER — SODIUM CHLORIDE 0.9% FLUSH
10.0000 mL | INTRAVENOUS | Status: DC | PRN
  Administered 2020-11-17: 10 mL

## 2020-11-17 NOTE — Patient Instructions (Signed)
[facility] Discharge Instructions for Patients Receiving Chemotherapy  Today you received the following chemotherapy agents   To help prevent nausea and vomiting after your treatment, we encourage you to take your nausea medication   If you develop nausea and vomiting that is not controlled by your nausea medication, call the clinic.   BELOW ARE SYMPTOMS THAT SHOULD BE REPORTED IMMEDIATELY:  *FEVER GREATER THAN 100.5 F  *CHILLS WITH OR WITHOUT FEVER  NAUSEA AND VOMITING THAT IS NOT CONTROLLED WITH YOUR NAUSEA MEDICATION  *UNUSUAL SHORTNESS OF BREATH  *UNUSUAL BRUISING OR BLEEDING  TENDERNESS IN MOUTH AND THROAT WITH OR WITHOUT PRESENCE OF ULCERS  *URINARY PROBLEMS  *BOWEL PROBLEMS  UNUSUAL RASH Items with * indicate a potential emergency and should be followed up as soon as possible.  Feel free to call the clinic should you have any questions or concerns. The clinic phone number is [phone].  Please show the CHEMO ALERT CARD at check-in to the Emergency Department and triage nurse.   

## 2020-11-17 NOTE — Progress Notes (Signed)
Patient presents today for Taxol/Carbo infusions.  Vital signs within parameters for treatment.  Labs pending.  No new complaints since last visit.  Labs within parameters for treatment.  Treatment given today per MD orders.  Tolerated infusion without adverse affects.  Vital signs stable.  No complaints at this time.  Discharge from clinic ambulatory in stable condition.  Alert and oriented X 3.  Follow up with [facility] as scheduled.

## 2020-11-23 ENCOUNTER — Other Ambulatory Visit: Payer: Self-pay

## 2020-11-23 ENCOUNTER — Inpatient Hospital Stay (HOSPITAL_COMMUNITY): Payer: BC Managed Care – PPO

## 2020-11-23 DIAGNOSIS — Z171 Estrogen receptor negative status [ER-]: Secondary | ICD-10-CM

## 2020-11-23 DIAGNOSIS — C50812 Malignant neoplasm of overlapping sites of left female breast: Secondary | ICD-10-CM

## 2020-11-23 DIAGNOSIS — Z5111 Encounter for antineoplastic chemotherapy: Secondary | ICD-10-CM | POA: Diagnosis not present

## 2020-11-23 LAB — CBC WITH DIFFERENTIAL/PLATELET
Abs Immature Granulocytes: 0.01 10*3/uL (ref 0.00–0.07)
Basophils Absolute: 0 10*3/uL (ref 0.0–0.1)
Basophils Relative: 1 %
Eosinophils Absolute: 0 10*3/uL (ref 0.0–0.5)
Eosinophils Relative: 1 %
HCT: 31.6 % — ABNORMAL LOW (ref 36.0–46.0)
Hemoglobin: 10.3 g/dL — ABNORMAL LOW (ref 12.0–15.0)
Immature Granulocytes: 0 %
Lymphocytes Relative: 65 %
Lymphs Abs: 1.7 10*3/uL (ref 0.7–4.0)
MCH: 35.9 pg — ABNORMAL HIGH (ref 26.0–34.0)
MCHC: 32.6 g/dL (ref 30.0–36.0)
MCV: 110.1 fL — ABNORMAL HIGH (ref 80.0–100.0)
Monocytes Absolute: 0.1 10*3/uL (ref 0.1–1.0)
Monocytes Relative: 5 %
Neutro Abs: 0.7 10*3/uL — ABNORMAL LOW (ref 1.7–7.7)
Neutrophils Relative %: 28 %
Platelets: 177 10*3/uL (ref 150–400)
RBC: 2.87 MIL/uL — ABNORMAL LOW (ref 3.87–5.11)
RDW: 14.4 % (ref 11.5–15.5)
WBC: 2.6 10*3/uL — ABNORMAL LOW (ref 4.0–10.5)
nRBC: 0 % (ref 0.0–0.2)

## 2020-11-23 LAB — COMPREHENSIVE METABOLIC PANEL
ALT: 24 U/L (ref 0–44)
AST: 23 U/L (ref 15–41)
Albumin: 3.6 g/dL (ref 3.5–5.0)
Alkaline Phosphatase: 68 U/L (ref 38–126)
Anion gap: 6 (ref 5–15)
BUN: 12 mg/dL (ref 8–23)
CO2: 26 mmol/L (ref 22–32)
Calcium: 9 mg/dL (ref 8.9–10.3)
Chloride: 103 mmol/L (ref 98–111)
Creatinine, Ser: 0.63 mg/dL (ref 0.44–1.00)
GFR, Estimated: >60 mL/min (ref >60)
Glucose, Bld: 89 mg/dL (ref 70–99)
Potassium: 3.8 mmol/L (ref 3.5–5.1)
Sodium: 135 mmol/L (ref 135–145)
Total Bilirubin: 0.6 mg/dL (ref 0.3–1.2)
Total Protein: 6.2 g/dL — ABNORMAL LOW (ref 6.5–8.1)

## 2020-11-23 LAB — TSH: TSH: 20.365 u[IU]/mL — ABNORMAL HIGH (ref 0.350–4.500)

## 2020-11-23 LAB — MAGNESIUM: Magnesium: 1.9 mg/dL (ref 1.7–2.4)

## 2020-11-24 ENCOUNTER — Inpatient Hospital Stay (HOSPITAL_BASED_OUTPATIENT_CLINIC_OR_DEPARTMENT_OTHER): Payer: BC Managed Care – PPO | Admitting: Hematology

## 2020-11-24 ENCOUNTER — Other Ambulatory Visit: Payer: Self-pay

## 2020-11-24 ENCOUNTER — Inpatient Hospital Stay (HOSPITAL_COMMUNITY): Payer: BC Managed Care – PPO

## 2020-11-24 VITALS — BP 116/71 | HR 68 | Temp 98.0°F | Resp 18 | Wt 159.7 lb

## 2020-11-24 DIAGNOSIS — C50812 Malignant neoplasm of overlapping sites of left female breast: Secondary | ICD-10-CM

## 2020-11-24 DIAGNOSIS — Z171 Estrogen receptor negative status [ER-]: Secondary | ICD-10-CM | POA: Diagnosis not present

## 2020-11-24 DIAGNOSIS — Z5111 Encounter for antineoplastic chemotherapy: Secondary | ICD-10-CM | POA: Diagnosis not present

## 2020-11-24 MED FILL — Dexamethasone Sodium Phosphate Inj 100 MG/10ML: INTRAMUSCULAR | Qty: 2 | Status: AC

## 2020-11-24 NOTE — Progress Notes (Signed)
No treatment today.  ANC 0.7.  Will return next week for blood work and possible injection.

## 2020-11-24 NOTE — Progress Notes (Signed)
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 Mammogram and US showed a 2.7cm mass at the 10:30 position, one left axillary lymph node with cortical thickening, calcifications in the lower inner left breast, and a 0.4cm group of calcifications in the right breast. Biopsy on 05/14/20 showed no malignancy in the right breast, and in the left breast, DCIS, high grade, ER/PR negative. 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    06/16/2020 -  Chemotherapy    Patient is on Treatment Plan: BREAST AC Q21D / CARBOPLATIN D1 + PACLITAXEL D1,8,15 Q21D      09/22/2020 Genetic Testing   Negative genetic testing. No pathogenic variants identified on the [facility] Common Hereditary Cancers Panel. The report date is 09/22/2020.  The Common Hereditary Cancers Panel offered by [facility] includes sequencing and/or deletion duplication testing of the following 48 genes: APC, ATM, AXIN2, BARD1, BMPR1A, BRCA1, BRCA2, BRIP1, CDH1, CDKN2A (p14ARF), CDKN2A (p16INK4a), CKD4, CHEK2, CTNNA1, DICER1, EPCAM (Deletion/duplication  testing only), GREM1 (promoter region deletion/duplication testing only), KIT, MEN1, MLH1, MSH2, MSH3, MSH6, MUTYH, NBN, NF1, NHTL1, PALB2, PDGFRA, PMS2, POLD1, POLE, PTEN, RAD50, RAD51C, RAD51D, RNF43, SDHB, SDHC, SDHD, SMAD4, SMARCA4. STK11, TP53, TSC1, TSC2, and VHL.  The following genes were evaluated for sequence changes only: SDHA and HOXB13 c.251G>A variant only.     CANCER STAGING: Cancer Staging Malignant neoplasm of overlapping sites of left breast in female, estrogen receptor negative ([place]) Staging form: Breast, AJCC 8th Edition - Clinical stage from 05/17/2020: Stage IIB (cT2, cN0, cM0, G3, ER-, PR-, HER2-) - Signed by Gardenia Phlegm, NP on 05/26/2020   INTERVAL HISTORY:  Jo Mills, a 70 y.o. female, returns for routine follow-up and consideration for next cycle of chemotherapy. Jo Mills was last seen on 11/10/2020.  Due for day #8 of cycle #8 of carboplatin, paclitaxel and Aloxi today.   Today she is accompanied by her husband. Overall, she tells me she has been feeling pretty well. Her rash and dry skin on her arms is improving; she stopped taking Decadron and the rash and swelling improved. She continues having numbness in her feet and notes having fatigue in her legs whenever she climbs the stairs. She notes having discoloration under her fingernails.  She continues staying active and runs multiple times per week.  She will not get chemo this week since her WBC is too low.    REVIEW OF SYSTEMS:  Review of Systems  Constitutional: Positive for appetite change (75%) and fatigue (75%).  Skin: Positive for rash (improving).  Neurological: Positive for numbness (feet).  All other systems reviewed and are negative.  PAST MEDICAL/SURGICAL HISTORY:  Past Medical History:  Diagnosis Date  . Allergy   . Breast cancer ([facility])   . Cancer ([place])   . Diverticulitis   . Family history of breast cancer 08/17/2020  . Family history of pancreatic cancer 08/17/2020  . GERD  (gastroesophageal reflux disease)   . Hypertension   . Skin cancer    Past Surgical History:  Procedure Laterality Date  . COLONOSCOPY    . PORTACATH PLACEMENT N/A 06/15/2020   Procedure: INSERTION PORT-A-CATH WITH ULTRASOUND GUIDANCE;  Surgeon: Donnie Mesa, MD;  Location: [facility];  Service: General;  Laterality: Right;  LMA, LEAVE PORT ACCESSED-CHEMO START 9/1    SOCIAL HISTORY:  Social History   Socioeconomic History  . Marital status: Married    Spouse name: Not on file  . Number of children: Not on file  . Years of education: Not on file  . Highest education level: Not on file  Occupational History  . Occupation: Naval architect  Tobacco Use  . Smoking status: Never Smoker  . Smokeless tobacco: Never Used  Substance and Sexual Activity  . Alcohol use: Not Currently  . Drug use: Never  . Sexual activity: Not on file  Other Topics Concern  . Not on file  Social History Narrative  . Not on file   Social Determinants of Health   Financial Resource Strain: Low Risk   . Difficulty of Paying Living Expenses: Not hard at all  Food Insecurity: No Food Insecurity  . Worried About Charity fundraiser in the Last Year: Never true  . Ran Out of Food in the Last Year: Never true  Transportation Needs: No Transportation Needs  . Lack of Transportation (Medical): No  . Lack of Transportation (Non-Medical): No  Physical Activity: Sufficiently Active  . Days of Exercise per Week: 7 days  . Minutes of Exercise per Session: 60 min  Stress: Stress Concern Present  . Feeling of Stress : To some extent  Social Connections: Moderately Integrated  . Frequency of Communication with Friends and Family: Once a week  . Frequency of Social Gatherings with Friends and Family: Never  . Attends Religious Services: More than 4 times per year  . Active Member of Clubs or Organizations: Yes  . Attends Archivist Meetings: 1 to 4 times per year  . Marital  Status: Married  Human resources officer Violence: Not At Risk  . Fear of Current or Ex-Partner: No  . Emotionally Abused: No  . Physically Abused: No  . Sexually Abused: No    FAMILY HISTORY:  Family History  Problem Relation Age of Onset  . Diverticulitis Mother   . Hypertension Maternal Aunt   . Hypertension Maternal Uncle   . Cancer Paternal Grandmother        unknown type; d. 8s  . Breast cancer Cousin 28       paternal   . Pancreatic cancer Cousin        paternal; dx 48s    CURRENT MEDICATIONS:  Current Outpatient Medications  Medication Sig Dispense Refill  . amLODipine-benazepril (LOTREL) 5-20 MG capsule Take 1 capsule by mouth daily.    . Bismuth Subsalicylate (PEPTO-BISMOL PO) Take by mouth as needed.    Marland Kitchen dexamethasone (DECADRON) 4 MG tablet Take 1 tablet daily for 3 days after chemo with food 30 tablet 1  . fexofenadine (ALLEGRA) 180 MG tablet Take 180 mg by mouth daily.    . fluconazole (DIFLUCAN) 100 MG tablet  Take 1 tablet (100 mg total) by mouth daily. 7 tablet 0  . ketoconazole (NIZORAL) 2 % cream Apply 1 application topically daily. 30 g 1  . Lactobacillus-Inulin (CULTURELLE DIGESTIVE DAILY) CAPS Take 1 capsule by mouth daily.    Marland Kitchen levothyroxine (SYNTHROID) 75 MCG tablet Take 1 tablet (75 mcg total) by mouth daily before breakfast. 30 tablet 1  . lidocaine-prilocaine (EMLA) cream SMARTSIG:1 Topical Every Night    . LORazepam (ATIVAN) 0.5 MG tablet Take 1 tablet (0.5 mg total) by mouth at bedtime as needed for sleep. 30 tablet 0  . omeprazole (PRILOSEC) 20 MG capsule Take 20 mg by mouth daily.    . ondansetron (ZOFRAN) 8 MG tablet Take 1 tablet (8 mg total) by mouth 2 (two) times daily as needed. Start on the third day after chemotherapy. 30 tablet 1  . prochlorperazine (COMPAZINE) 10 MG tablet Take 1 tablet (10 mg total) by mouth every 6 (six) hours as needed (Nausea or vomiting). 30 tablet 1  . magic mouthwash SOLN Take 5 mLs by mouth 3 (three) times daily as needed  for mouth pain. (Patient not taking: Reported on 11/24/2020) 450 mL 0   No current facility-administered medications for this visit.   Facility-Administered Medications Ordered in Other Visits  Medication Dose Route Frequency Provider Last Rate Last Admin  . sodium chloride flush (NS) 0.9 % injection 10 mL  10 mL Intracatheter PRN Ladell Pier, MD        ALLERGIES:  Allergies  Allergen Reactions  . Grapefruit Extract Other (See Comments)    Other Reaction: Other reaction  . Pseudoephedrine Hcl Other (See Comments)    Other Reaction: Other reaction  . Nickel   . Pineapple   . Amoxicillin Rash    PHYSICAL EXAM:  Performance status (ECOG): 1 - Symptomatic but completely ambulatory  Vitals:   11/24/20 0959  BP: 116/71  Pulse: 68  Resp: 18  Temp: 98 F (36.7 C)  SpO2: 99%   Wt Readings from Last 3 Encounters:  11/24/20 159 lb 11.2 oz (72.4 kg)  11/17/20 159 lb 3.2 oz (72.2 kg)  11/10/20 158 lb (71.7 kg)   Physical Exam Vitals reviewed.  Constitutional:      Appearance: Normal appearance.  Cardiovascular:     Rate and Rhythm: Normal rate and regular rhythm.     Pulses: Normal pulses.     Heart sounds: Normal heart sounds.  Pulmonary:     Effort: Pulmonary effort is normal.     Breath sounds: Normal breath sounds.  Chest:     Comments: Port-a-cath in R chest Musculoskeletal:     Right lower leg: No edema.     Left lower leg: No edema.  Skin:    Findings: Rash (on bilat forearms and knees improving) present.  Neurological:     General: No focal deficit present.     Mental Status: She is alert and oriented to person, place, and time.  Psychiatric:        Mood and Affect: Mood normal.        Behavior: Behavior normal.     LABORATORY DATA:  I have reviewed the labs as listed.  CBC Latest Ref Rng & Units 11/23/2020 11/17/2020 11/16/2020  WBC 4.0 - 10.5 K/uL 2.6(L) 6.9 2.7(L)  Hemoglobin 12.0 - 15.0 g/dL 10.3(L) 11.3(L) 10.5(L)  Hematocrit 36.0 - 46.0 % 31.6(L)  34.3(L) 31.5(L)  Platelets 150 - 400 K/uL 177 157 150   CMP Latest Ref Rng & Units 11/23/2020  11/16/2020 11/09/2020  Glucose 70 - 99 mg/dL 89 88 101(H)  BUN 8 - 23 mg/dL _0 Creatinine 0.44 - 1.00 mg/dL 0.63 0.67 0.76  Sodium 135 - 145 mmol/L 135 137 132(L)  Potassium 3.5 - 5.1 mmol/L 3.8 3.7 3.6  Chloride 98 - 111 mmol/L 103 104 99  CO2 22 - 32 mmol/L _1 Calcium 8.9 - 10.3 mg/dL 9.0 8.9 8.8(L)  Total Protein 6.5 - 8.1 g/dL 6.2(L) 6.0(L) 6.1(L)  Total Bilirubin 0.3 - 1.2 mg/dL 0.6 0.6 0.5  Alkaline Phos 38 - 126 U/L 68 60 54  AST 15 - 41 U/L _2 ALT 0 - 44 U/L 24 25 35    DIAGNOSTIC IMAGING:  I have independently reviewed the scans and discussed with the patient. No results found.   ASSESSMENT:  1. Stage II (T2N0) left breast TNBC: -Felt left breast mass for 5 months. -Mammogram on 05/07/2020 with suspicious mass in the 10:30 o'clock position in the left breast. Suspicious calcifications in the anterior lower inner quadrant of the left breast. -MRI on 06/05/2020 shows left breast mass measuring 3.8 x 3.3 x 3.2 cm in the upper inner quadrant. 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 No suspicious lymphadenopathy. No MRI evidence of malignancy on the right. -Left breast upper inner quadrant biopsy on 05/14/2020 showed DCIS with calcifications. Right breast biopsy shows fibroadenomatoid nodule with calcifications. ER/PR was negative. -Left breast core needle biopsy at 10:30 position shows invasive ductal carcinoma. ER/PR/HER-2 was negative. Ki-67 85%. Left lymph node biopsy was reactive. -Patient was evaluated by Dr. [name] and Dr. Georgette Dover. -She received first cycle of chemotherapy with Adriamycin and cyclophosphamide with pembrolizumab (keynote-522 trial)added for improved PCR by about 15%. -Cycle 1 of Adriamycin, cyclophosphamide and pembrolizumab on9/10/2019.  2. Family history: -Paternal cousin and second cousin had breast  cancer. Paternal grandmother also had some type of cancer. -Another paternal cousin died of pancreatic cancer. Another paternal cousin had leukemia.   PLAN:  1. Triple negative left breast cancer: -We have held [place] since last dose on 09/08/2020. -We have reviewed labs from 11/23/2020.  White count is low at 2.6 and ANC was low.  Platelet count is normal. -LFTs and magnesium, potassium normal. -We will hold off on treatment today.  We will resume week lab and treatment next week after checking her blood on Tuesday to see if she needs any G-CSF. -She is planning to have breast surgery towards end of March. -RTC 2 weeks for follow-up. -We will plan to resume Keytruda after surgery.  2. Family history: -Genetic testing is negative.  3.Skin rash: -This has improved since we cut back on the dose of paclitaxel. -She stopped taking dexamethasone.  4. High risk drug monitoring: -TSH today is 20. -We will increase Synthroid to 100 mcg daily.  5.  Neuropathy: -Numbness in the feet and fingertips has improved after we cut back on the dose of paclitaxel.   Orders placed this encounter:  No orders of the defined types were placed in this encounter.    Derek Jack, MD [place] [place] [phone]   I, Milinda Antis, am acting as a scribe for Dr. Sanda Linger.  I, Derek Jack MD, have reviewed the above documentation for accuracy and completeness, and I agree with the above.

## 2020-11-24 NOTE — Patient Instructions (Signed)
[facility] at [facility] Discharge Instructions  You were seen today by Dr. Delton Coombes. He went over your recent results. You did not receive your treatment today. Going forward, you will get labs and Zarxio (white cell booster shot) on Tuesday and treatment on Wednesday. You will receive your treatment next week. Dr. Delton Coombes will see you back in 2 weeks for labs and follow up.   Thank you for choosing [facility] at [facility] to provide your oncology and hematology care.  To afford each patient quality time with our provider, please arrive at least 15 minutes before your scheduled appointment time.   If you have a lab appointment with the [facility] please come in thru the Main Entrance and check in at the main information desk  You need to re-schedule your appointment should you arrive 10 or more minutes late.  We strive to give you quality time with our providers, and arriving late affects you and other patients whose appointments are after yours.  Also, if you no show three or more times for appointments you may be dismissed from the clinic at the providers discretion.     Again, thank you for choosing [facility].  Our hope is that these requests will decrease the amount of time that you wait before being seen by our physicians.       _____________________________________________________________  Should you have questions after your visit to [facility], please contact our office at [phone] between the hours of 8:00 a.m. and 4:30 p.m.  Voicemails left after 4:00 p.m. will not be returned until the following business day.  For prescription refill requests, have your pharmacy contact our office and allow 72 hours.    Cancer Center Support Programs:   > Cancer Support Group  2nd Tuesday of the month 1pm-2pm, Journey Room

## 2020-11-24 NOTE — Progress Notes (Signed)
Patient was assessed by Dr. Delton Coombes and labs have been reviewed.  No treatment today due to neutrophil blood counts being low. Primary RN and pharmacy aware.

## 2020-11-26 ENCOUNTER — Other Ambulatory Visit (HOSPITAL_COMMUNITY): Payer: Self-pay | Admitting: Hematology

## 2020-11-26 MED ORDER — LEVOTHYROXINE SODIUM 100 MCG PO TABS: 100.0000 ug | ORAL_TABLET | Freq: Every day | ORAL | 1 refills | Status: DC

## 2020-11-27 ENCOUNTER — Other Ambulatory Visit: Payer: Self-pay | Admitting: Surgery

## 2020-11-27 DIAGNOSIS — C50912 Malignant neoplasm of unspecified site of left female breast: Secondary | ICD-10-CM

## 2020-11-30 ENCOUNTER — Other Ambulatory Visit: Payer: Self-pay

## 2020-11-30 ENCOUNTER — Inpatient Hospital Stay (HOSPITAL_COMMUNITY): Payer: BC Managed Care – PPO

## 2020-11-30 VITALS — BP 126/79 | HR 77 | Temp 96.8°F | Resp 18 | Wt 159.8 lb

## 2020-11-30 DIAGNOSIS — D702 Other drug-induced agranulocytosis: Secondary | ICD-10-CM

## 2020-11-30 DIAGNOSIS — Z171 Estrogen receptor negative status [ER-]: Secondary | ICD-10-CM

## 2020-11-30 DIAGNOSIS — Z5111 Encounter for antineoplastic chemotherapy: Secondary | ICD-10-CM | POA: Diagnosis not present

## 2020-11-30 DIAGNOSIS — C50812 Malignant neoplasm of overlapping sites of left female breast: Secondary | ICD-10-CM

## 2020-11-30 LAB — COMPREHENSIVE METABOLIC PANEL
ALT: 18 U/L (ref 0–44)
AST: 23 U/L (ref 15–41)
Albumin: 3.6 g/dL (ref 3.5–5.0)
Alkaline Phosphatase: 77 U/L (ref 38–126)
Anion gap: 3 — ABNORMAL LOW (ref 5–15)
BUN: 15 mg/dL (ref 8–23)
CO2: 23 mmol/L (ref 22–32)
Calcium: 8.8 mg/dL — ABNORMAL LOW (ref 8.9–10.3)
Chloride: 109 mmol/L (ref 98–111)
Creatinine, Ser: 0.64 mg/dL (ref 0.44–1.00)
GFR, Estimated: >60 mL/min (ref >60)
Glucose, Bld: 93 mg/dL (ref 70–99)
Potassium: 3.7 mmol/L (ref 3.5–5.1)
Sodium: 135 mmol/L (ref 135–145)
Total Bilirubin: 0.4 mg/dL (ref 0.3–1.2)
Total Protein: 6.1 g/dL — ABNORMAL LOW (ref 6.5–8.1)

## 2020-11-30 LAB — CBC WITH DIFFERENTIAL/PLATELET
Abs Immature Granulocytes: 0 10*3/uL (ref 0.00–0.07)
Basophils Absolute: 0 10*3/uL (ref 0.0–0.1)
Basophils Relative: 2 %
Eosinophils Absolute: 0 10*3/uL (ref 0.0–0.5)
Eosinophils Relative: 2 %
HCT: 31.1 % — ABNORMAL LOW (ref 36.0–46.0)
Hemoglobin: 10.1 g/dL — ABNORMAL LOW (ref 12.0–15.0)
Immature Granulocytes: 0 %
Lymphocytes Relative: 61 %
Lymphs Abs: 1.6 10*3/uL (ref 0.7–4.0)
MCH: 35.3 pg — ABNORMAL HIGH (ref 26.0–34.0)
MCHC: 32.5 g/dL (ref 30.0–36.0)
MCV: 108.7 fL — ABNORMAL HIGH (ref 80.0–100.0)
Monocytes Absolute: 0.4 10*3/uL (ref 0.1–1.0)
Monocytes Relative: 15 %
Neutro Abs: 0.5 10*3/uL — ABNORMAL LOW (ref 1.7–7.7)
Neutrophils Relative %: 20 %
Platelets: 186 10*3/uL (ref 150–400)
RBC: 2.86 MIL/uL — ABNORMAL LOW (ref 3.87–5.11)
RDW: 14.2 % (ref 11.5–15.5)
WBC: 2.6 10*3/uL — ABNORMAL LOW (ref 4.0–10.5)
nRBC: 0 % (ref 0.0–0.2)

## 2020-11-30 LAB — TSH: TSH: 12.098 u[IU]/mL — ABNORMAL HIGH (ref 0.350–4.500)

## 2020-11-30 MED ORDER — FILGRASTIM-SNDZ 300 MCG/0.5ML IJ SOSY
300.0000 ug | PREFILLED_SYRINGE | Freq: Once | INTRAMUSCULAR | Status: AC
  Administered 2020-11-30: 300 ug via SUBCUTANEOUS
  Filled 2020-11-30: qty 0.5

## 2020-11-30 NOTE — Progress Notes (Signed)
Jo Mills presents today for injection per the provider's orders.  Zarxio administration without incident; injection site WNL; see MAR for injection details.  Patient tolerated procedure well and without incident.  No questions or complaints noted at this time. Discharged ambulatory in c/o spouse in stable condition.

## 2020-11-30 NOTE — Progress Notes (Signed)
Patient presents today for Zarxio injection.  Vital signs within parameters for treatment.  Patients ANC noted to be 0.5.    Injection given today per MD orders.  Tolerated injection without adverse affects.  Injection site WNL.  Vital signs stable.  No complaints at this time.  Discharge from clinic ambulatory in stable condition.  Alert and oriented X 3.  Follow up with [facility] as scheduled.

## 2020-12-01 ENCOUNTER — Other Ambulatory Visit: Payer: Self-pay

## 2020-12-01 ENCOUNTER — Encounter (HOSPITAL_COMMUNITY): Payer: Self-pay

## 2020-12-01 ENCOUNTER — Inpatient Hospital Stay (HOSPITAL_COMMUNITY): Payer: BC Managed Care – PPO

## 2020-12-01 VITALS — BP 118/76 | HR 73 | Temp 98.1°F | Resp 18 | Wt 160.2 lb

## 2020-12-01 DIAGNOSIS — C50812 Malignant neoplasm of overlapping sites of left female breast: Secondary | ICD-10-CM

## 2020-12-01 DIAGNOSIS — Z171 Estrogen receptor negative status [ER-]: Secondary | ICD-10-CM

## 2020-12-01 DIAGNOSIS — Z5111 Encounter for antineoplastic chemotherapy: Secondary | ICD-10-CM | POA: Diagnosis not present

## 2020-12-01 LAB — CBC WITH DIFFERENTIAL/PLATELET
Band Neutrophils: 2 %
Basophils Absolute: 0 10*3/uL (ref 0.0–0.1)
Basophils Relative: 0 %
Eosinophils Absolute: 0.1 10*3/uL (ref 0.0–0.5)
Eosinophils Relative: 1 %
HCT: 34.8 % — ABNORMAL LOW (ref 36.0–46.0)
Hemoglobin: 11.2 g/dL — ABNORMAL LOW (ref 12.0–15.0)
Lymphocytes Relative: 51 %
Lymphs Abs: 2.9 10*3/uL (ref 0.7–4.0)
MCH: 35.4 pg — ABNORMAL HIGH (ref 26.0–34.0)
MCHC: 32.2 g/dL (ref 30.0–36.0)
MCV: 110.1 fL — ABNORMAL HIGH (ref 80.0–100.0)
Metamyelocytes Relative: 2 %
Monocytes Absolute: 0.1 10*3/uL (ref 0.1–1.0)
Monocytes Relative: 1 %
Neutro Abs: 2.6 10*3/uL (ref 1.7–7.7)
Neutrophils Relative %: 43 %
Platelets: 188 10*3/uL (ref 150–400)
RBC: 3.16 MIL/uL — ABNORMAL LOW (ref 3.87–5.11)
RDW: 14.4 % (ref 11.5–15.5)
WBC: 5.7 10*3/uL (ref 4.0–10.5)
nRBC: 0 % (ref 0.0–0.2)

## 2020-12-01 MED ORDER — FAMOTIDINE IN NACL 20-0.9 MG/50ML-% IV SOLN
20.0000 mg | Freq: Once | INTRAVENOUS | Status: AC
  Administered 2020-12-01: 20 mg via INTRAVENOUS
  Filled 2020-12-01: qty 50

## 2020-12-01 MED ORDER — SODIUM CHLORIDE 0.9 % IV SOLN
134.8500 mg | Freq: Once | INTRAVENOUS | Status: AC
  Administered 2020-12-01: 130 mg via INTRAVENOUS
  Filled 2020-12-01: qty 13

## 2020-12-01 MED ORDER — PALONOSETRON HCL INJECTION 0.25 MG/5ML
0.2500 mg | Freq: Once | INTRAVENOUS | Status: AC
Start: 2020-12-01 — End: 2020-12-01
  Administered 2020-12-01: 0.25 mg via INTRAVENOUS
  Filled 2020-12-01: qty 5

## 2020-12-01 MED ORDER — SODIUM CHLORIDE 0.9 % IV SOLN
20.0000 mg | Freq: Once | INTRAVENOUS | Status: AC
  Administered 2020-12-01: 20 mg via INTRAVENOUS
  Filled 2020-12-01: qty 20

## 2020-12-01 MED ORDER — DIPHENHYDRAMINE HCL 50 MG/ML IJ SOLN
25.0000 mg | Freq: Once | INTRAMUSCULAR | Status: AC
Start: 2020-12-01 — End: 2020-12-01
  Administered 2020-12-01: 25 mg via INTRAVENOUS
  Filled 2020-12-01: qty 1

## 2020-12-01 MED ORDER — HEPARIN SOD (PORK) LOCK FLUSH 100 UNIT/ML IV SOLN
500.0000 [IU] | Freq: Once | INTRAVENOUS | Status: AC | PRN
  Administered 2020-12-01: 500 [IU]

## 2020-12-01 MED ORDER — SODIUM CHLORIDE 0.9 % IV SOLN
64.0000 mg/m2 | Freq: Once | INTRAVENOUS | Status: AC
  Administered 2020-12-01: 120 mg via INTRAVENOUS
  Filled 2020-12-01: qty 20

## 2020-12-01 MED ORDER — SODIUM CHLORIDE 0.9% FLUSH
10.0000 mL | INTRAVENOUS | Status: DC | PRN
  Administered 2020-12-01: 10 mL

## 2020-12-01 MED ORDER — SODIUM CHLORIDE 0.9 % IV SOLN: Freq: Once | INTRAVENOUS | Status: AC

## 2020-12-01 NOTE — Patient Instructions (Signed)
[facility] Discharge Instructions for Patients Receiving Chemotherapy  Today you received the following chemotherapy agents   To help prevent nausea and vomiting after your treatment, we encourage you to take your nausea medication   If you develop nausea and vomiting that is not controlled by your nausea medication, call the clinic.   BELOW ARE SYMPTOMS THAT SHOULD BE REPORTED IMMEDIATELY:  *FEVER GREATER THAN 100.5 F  *CHILLS WITH OR WITHOUT FEVER  NAUSEA AND VOMITING THAT IS NOT CONTROLLED WITH YOUR NAUSEA MEDICATION  *UNUSUAL SHORTNESS OF BREATH  *UNUSUAL BRUISING OR BLEEDING  TENDERNESS IN MOUTH AND THROAT WITH OR WITHOUT PRESENCE OF ULCERS  *URINARY PROBLEMS  *BOWEL PROBLEMS  UNUSUAL RASH Items with * indicate a potential emergency and should be followed up as soon as possible.  Feel free to call the clinic should you have any questions or concerns. The clinic phone number is [phone].  Please show the CHEMO ALERT CARD at check-in to the Emergency Department and triage nurse.   

## 2020-12-01 NOTE — Progress Notes (Signed)
Patient presents today for treatment. ANC today 2.6. Patient has no complaints of any pain today. Patient's vital signs are within parameters for treatment today.   Treatment given today per MD orders. Tolerated infusion without adverse affects. Vital signs stable. No complaints at this time. Discharged from clinic ambulatory in stable condition. Alert and oriented x 3. F/U with [facility] as scheduled.

## 2020-12-07 ENCOUNTER — Encounter (HOSPITAL_COMMUNITY): Payer: Self-pay

## 2020-12-07 ENCOUNTER — Inpatient Hospital Stay (HOSPITAL_COMMUNITY): Payer: BC Managed Care – PPO

## 2020-12-07 ENCOUNTER — Other Ambulatory Visit: Payer: Self-pay

## 2020-12-07 VITALS — BP 116/67 | HR 72 | Temp 97.1°F | Resp 18

## 2020-12-07 DIAGNOSIS — Z5111 Encounter for antineoplastic chemotherapy: Secondary | ICD-10-CM | POA: Diagnosis not present

## 2020-12-07 DIAGNOSIS — C50812 Malignant neoplasm of overlapping sites of left female breast: Secondary | ICD-10-CM

## 2020-12-07 DIAGNOSIS — D702 Other drug-induced agranulocytosis: Secondary | ICD-10-CM

## 2020-12-07 DIAGNOSIS — Z171 Estrogen receptor negative status [ER-]: Secondary | ICD-10-CM

## 2020-12-07 LAB — CBC WITH DIFFERENTIAL/PLATELET
Abs Immature Granulocytes: 0.02 10*3/uL (ref 0.00–0.07)
Basophils Absolute: 0 10*3/uL (ref 0.0–0.1)
Basophils Relative: 1 %
Eosinophils Absolute: 0 10*3/uL (ref 0.0–0.5)
Eosinophils Relative: 1 %
HCT: 32.3 % — ABNORMAL LOW (ref 36.0–46.0)
Hemoglobin: 10.6 g/dL — ABNORMAL LOW (ref 12.0–15.0)
Immature Granulocytes: 1 %
Lymphocytes Relative: 64 %
Lymphs Abs: 2 10*3/uL (ref 0.7–4.0)
MCH: 35.5 pg — ABNORMAL HIGH (ref 26.0–34.0)
MCHC: 32.8 g/dL (ref 30.0–36.0)
MCV: 108 fL — ABNORMAL HIGH (ref 80.0–100.0)
Monocytes Absolute: 0.2 10*3/uL (ref 0.1–1.0)
Monocytes Relative: 6 %
Neutro Abs: 0.8 10*3/uL — ABNORMAL LOW (ref 1.7–7.7)
Neutrophils Relative %: 27 %
Platelets: 198 10*3/uL (ref 150–400)
RBC: 2.99 MIL/uL — ABNORMAL LOW (ref 3.87–5.11)
RDW: 13.9 % (ref 11.5–15.5)
WBC: 3.1 10*3/uL — ABNORMAL LOW (ref 4.0–10.5)
nRBC: 0 % (ref 0.0–0.2)

## 2020-12-07 LAB — COMPREHENSIVE METABOLIC PANEL
ALT: 17 U/L (ref 0–44)
AST: 21 U/L (ref 15–41)
Albumin: 3.7 g/dL (ref 3.5–5.0)
Alkaline Phosphatase: 69 U/L (ref 38–126)
Anion gap: 4 — ABNORMAL LOW (ref 5–15)
BUN: 15 mg/dL (ref 8–23)
CO2: 27 mmol/L (ref 22–32)
Calcium: 8.7 mg/dL — ABNORMAL LOW (ref 8.9–10.3)
Chloride: 103 mmol/L (ref 98–111)
Creatinine, Ser: 0.57 mg/dL (ref 0.44–1.00)
GFR, Estimated: >60 mL/min (ref >60)
Glucose, Bld: 90 mg/dL (ref 70–99)
Potassium: 4.2 mmol/L (ref 3.5–5.1)
Sodium: 134 mmol/L — ABNORMAL LOW (ref 135–145)
Total Bilirubin: 0.5 mg/dL (ref 0.3–1.2)
Total Protein: 6.3 g/dL — ABNORMAL LOW (ref 6.5–8.1)

## 2020-12-07 LAB — TSH: TSH: 8.876 u[IU]/mL — ABNORMAL HIGH (ref 0.350–4.500)

## 2020-12-07 MED ORDER — FILGRASTIM-SNDZ 300 MCG/0.5ML IJ SOSY
300.0000 ug | PREFILLED_SYRINGE | Freq: Once | INTRAMUSCULAR | Status: AC
  Administered 2020-12-07: 300 ug via SUBCUTANEOUS
  Filled 2020-12-07: qty 0.5

## 2020-12-07 NOTE — Progress Notes (Signed)
Patient tolerated injection with no complaints voiced.  Site clean and dry with no bruising or swelling noted at site.  Band aid applied.  Vss with discharge and left in satisfactory condition with no s/s of distress noted.  

## 2020-12-08 ENCOUNTER — Inpatient Hospital Stay (HOSPITAL_BASED_OUTPATIENT_CLINIC_OR_DEPARTMENT_OTHER): Payer: BC Managed Care – PPO | Admitting: Hematology

## 2020-12-08 ENCOUNTER — Inpatient Hospital Stay (HOSPITAL_COMMUNITY): Payer: BC Managed Care – PPO

## 2020-12-08 ENCOUNTER — Other Ambulatory Visit: Payer: Self-pay

## 2020-12-08 VITALS — BP 112/63 | HR 67 | Temp 97.1°F | Resp 18

## 2020-12-08 VITALS — BP 126/79 | HR 76 | Temp 97.3°F | Resp 18 | Wt 158.6 lb

## 2020-12-08 DIAGNOSIS — Z171 Estrogen receptor negative status [ER-]: Secondary | ICD-10-CM

## 2020-12-08 DIAGNOSIS — C50812 Malignant neoplasm of overlapping sites of left female breast: Secondary | ICD-10-CM | POA: Diagnosis not present

## 2020-12-08 DIAGNOSIS — Z5111 Encounter for antineoplastic chemotherapy: Secondary | ICD-10-CM | POA: Diagnosis not present

## 2020-12-08 LAB — COMPREHENSIVE METABOLIC PANEL
ALT: 19 U/L (ref 0–44)
AST: 21 U/L (ref 15–41)
Albumin: 3.6 g/dL (ref 3.5–5.0)
Alkaline Phosphatase: 78 U/L (ref 38–126)
Anion gap: 5 (ref 5–15)
BUN: 13 mg/dL (ref 8–23)
CO2: 27 mmol/L (ref 22–32)
Calcium: 9 mg/dL (ref 8.9–10.3)
Chloride: 106 mmol/L (ref 98–111)
Creatinine, Ser: 0.58 mg/dL (ref 0.44–1.00)
GFR, Estimated: >60 mL/min (ref >60)
Glucose, Bld: 66 mg/dL — ABNORMAL LOW (ref 70–99)
Potassium: 3.7 mmol/L (ref 3.5–5.1)
Sodium: 138 mmol/L (ref 135–145)
Total Bilirubin: 0.6 mg/dL (ref 0.3–1.2)
Total Protein: 6.1 g/dL — ABNORMAL LOW (ref 6.5–8.1)

## 2020-12-08 LAB — CBC WITH DIFFERENTIAL/PLATELET
Abs Immature Granulocytes: 0.05 10*3/uL (ref 0.00–0.07)
Basophils Absolute: 0.1 10*3/uL (ref 0.0–0.1)
Basophils Relative: 1 %
Eosinophils Absolute: 0.1 10*3/uL (ref 0.0–0.5)
Eosinophils Relative: 1 %
HCT: 32.8 % — ABNORMAL LOW (ref 36.0–46.0)
Hemoglobin: 10.8 g/dL — ABNORMAL LOW (ref 12.0–15.0)
Immature Granulocytes: 1 %
Lymphocytes Relative: 23 %
Lymphs Abs: 1.5 10*3/uL (ref 0.7–4.0)
MCH: 35.9 pg — ABNORMAL HIGH (ref 26.0–34.0)
MCHC: 32.9 g/dL (ref 30.0–36.0)
MCV: 109 fL — ABNORMAL HIGH (ref 80.0–100.0)
Monocytes Absolute: 0.2 10*3/uL (ref 0.1–1.0)
Monocytes Relative: 3 %
Neutro Abs: 4.5 10*3/uL (ref 1.7–7.7)
Neutrophils Relative %: 71 %
Platelets: 186 10*3/uL (ref 150–400)
RBC: 3.01 MIL/uL — ABNORMAL LOW (ref 3.87–5.11)
RDW: 14 % (ref 11.5–15.5)
WBC: 6.4 10*3/uL (ref 4.0–10.5)
nRBC: 0 % (ref 0.0–0.2)

## 2020-12-08 LAB — TSH: TSH: 9.79 u[IU]/mL — ABNORMAL HIGH (ref 0.350–4.500)

## 2020-12-08 MED ORDER — DIPHENHYDRAMINE HCL 50 MG/ML IJ SOLN
INTRAMUSCULAR | Status: AC
  Filled 2020-12-08: qty 1

## 2020-12-08 MED ORDER — HEPARIN SOD (PORK) LOCK FLUSH 100 UNIT/ML IV SOLN
500.0000 [IU] | Freq: Once | INTRAVENOUS | Status: AC | PRN
Start: 2020-12-08 — End: 2020-12-08
  Administered 2020-12-08: 500 [IU]

## 2020-12-08 MED ORDER — DIPHENHYDRAMINE HCL 50 MG/ML IJ SOLN
25.0000 mg | Freq: Once | INTRAMUSCULAR | Status: AC
  Administered 2020-12-08: 25 mg via INTRAVENOUS

## 2020-12-08 MED ORDER — SODIUM CHLORIDE 0.9% FLUSH
10.0000 mL | INTRAVENOUS | Status: DC | PRN
  Administered 2020-12-08: 10 mL

## 2020-12-08 MED ORDER — SODIUM CHLORIDE 0.9 % IV SOLN
134.8500 mg | Freq: Once | INTRAVENOUS | Status: AC
  Administered 2020-12-08: 130 mg via INTRAVENOUS
  Filled 2020-12-08: qty 13

## 2020-12-08 MED ORDER — FAMOTIDINE IN NACL 20-0.9 MG/50ML-% IV SOLN
20.0000 mg | Freq: Once | INTRAVENOUS | Status: AC
  Administered 2020-12-08: 20 mg via INTRAVENOUS

## 2020-12-08 MED ORDER — FAMOTIDINE IN NACL 20-0.9 MG/50ML-% IV SOLN
INTRAVENOUS | Status: AC
  Filled 2020-12-08: qty 50

## 2020-12-08 MED ORDER — SODIUM CHLORIDE 0.9 % IV SOLN
Freq: Once | INTRAVENOUS | Status: AC
Start: 2020-12-08 — End: 2020-12-08

## 2020-12-08 MED ORDER — SODIUM CHLORIDE 0.9 % IV SOLN
20.0000 mg | Freq: Once | INTRAVENOUS | Status: AC
  Administered 2020-12-08: 20 mg via INTRAVENOUS
  Filled 2020-12-08: qty 20

## 2020-12-08 MED ORDER — SODIUM CHLORIDE 0.9 % IV SOLN
64.0000 mg/m2 | Freq: Once | INTRAVENOUS | Status: AC
  Administered 2020-12-08: 120 mg via INTRAVENOUS
  Filled 2020-12-08: qty 20

## 2020-12-08 MED ORDER — PALONOSETRON HCL INJECTION 0.25 MG/5ML
0.2500 mg | Freq: Once | INTRAVENOUS | Status: AC
  Administered 2020-12-08: 0.25 mg via INTRAVENOUS

## 2020-12-08 MED ORDER — PALONOSETRON HCL INJECTION 0.25 MG/5ML
INTRAVENOUS | Status: AC
  Filled 2020-12-08: qty 5

## 2020-12-08 NOTE — Progress Notes (Signed)
Pt here for taxol/carbo today.  Labs and vital signs WNL for treatment today.  Okay for treatment.   Tolerated treatment well today without incidence.  Discharged in stable condition ambulatory.  Vital signs stable prior to discharge.

## 2020-12-08 NOTE — Patient Instructions (Signed)
[facility] Discharge Instructions for Patients Receiving Chemotherapy  Today you received the following chemotherapy agents   To help prevent nausea and vomiting after your treatment, we encourage you to take your nausea medication   If you develop nausea and vomiting that is not controlled by your nausea medication, call the clinic.   BELOW ARE SYMPTOMS THAT SHOULD BE REPORTED IMMEDIATELY:  *FEVER GREATER THAN 100.5 F  *CHILLS WITH OR WITHOUT FEVER  NAUSEA AND VOMITING THAT IS NOT CONTROLLED WITH YOUR NAUSEA MEDICATION  *UNUSUAL SHORTNESS OF BREATH  *UNUSUAL BRUISING OR BLEEDING  TENDERNESS IN MOUTH AND THROAT WITH OR WITHOUT PRESENCE OF ULCERS  *URINARY PROBLEMS  *BOWEL PROBLEMS  UNUSUAL RASH Items with * indicate a potential emergency and should be followed up as soon as possible.  Feel free to call the clinic should you have any questions or concerns. The clinic phone number is [phone].  Please show the CHEMO ALERT CARD at check-in to the Emergency Department and triage nurse.   

## 2020-12-08 NOTE — Patient Instructions (Signed)
[place] at [facility] Discharge Instructions  You were seen today by Dr. Delton Coombes. He went over your recent results. You received your final chemo treatment today. Keep your appointment with Dr. Georgette Dover and proceed with your surgery. Dr. Delton Coombes will see you back in 2 months (or 1 month after surgery) for labs and follow up.   Thank you for choosing [facility] at [facility] to provide your oncology and hematology care.  To afford each patient quality time with our provider, please arrive at least 15 minutes before your scheduled appointment time.   If you have a lab appointment with the [facility] please come in thru the Main Entrance and check in at the main information desk  You need to re-schedule your appointment should you arrive 10 or more minutes late.  We strive to give you quality time with our providers, and arriving late affects you and other patients whose appointments are after yours.  Also, if you no show three or more times for appointments you may be dismissed from the clinic at the providers discretion.     Again, thank you for choosing [facility]  Our hope is that these requests will decrease the amount of time that you wait before being seen by our physicians.       _____________________________________________________________  Should you have questions after your visit to [facility], please contact our office at [phone] between the hours of 8:00 a.m. and 4:30 p.m.  Voicemails left after 4:00 p.m. will not be returned until the following business day.  For prescription refill requests, have your pharmacy contact our office and allow 72 hours.    Cancer Center Support Programs:   > Cancer Support Group  2nd Tuesday of the month 1pm-2pm, Journey Room

## 2020-12-08 NOTE — Progress Notes (Signed)
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 Mammogram and US showed a 2.7cm mass at the 10:30 position, one left axillary lymph node with cortical thickening, calcifications in the lower inner left breast, and a 0.4cm group of calcifications in the right breast. Biopsy on 05/14/20 showed no malignancy in the right breast, and in the left breast, DCIS, high grade, ER/PR negative. 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    06/16/2020 -  Chemotherapy    Patient is on Treatment Plan: BREAST AC Q21D / CARBOPLATIN D1 + PACLITAXEL D1,8,15 Q21D      09/22/2020 Genetic Testing   Negative genetic testing. No pathogenic variants identified on the [facility] Common Hereditary Cancers Panel. The report date is 09/22/2020.  The Common Hereditary Cancers Panel offered by [facility] includes sequencing and/or deletion duplication testing of the following 48 genes: APC, ATM, AXIN2, BARD1, BMPR1A, BRCA1, BRCA2, BRIP1, CDH1, CDKN2A (p14ARF), CDKN2A (p16INK4a), CKD4, CHEK2, CTNNA1, DICER1, EPCAM (Deletion/duplication  testing only), GREM1 (promoter region deletion/duplication testing only), KIT, MEN1, MLH1, MSH2, MSH3, MSH6, MUTYH, NBN, NF1, NHTL1, PALB2, PDGFRA, PMS2, POLD1, POLE, PTEN, RAD50, RAD51C, RAD51D, RNF43, SDHB, SDHC, SDHD, SMAD4, SMARCA4. STK11, TP53, TSC1, TSC2, and VHL.  The following genes were evaluated for sequence changes only: SDHA and HOXB13 c.251G>A variant only.     CANCER STAGING: Cancer Staging Malignant neoplasm of overlapping sites of left breast in female, estrogen receptor negative ([place]) Staging form: Breast, AJCC 8th Edition - Clinical stage from 05/17/2020: Stage IIB (cT2, cN0, cM0, G3, ER-, PR-, HER2-) - Signed by Gardenia Phlegm, NP on 05/26/2020   INTERVAL HISTORY:  [name], a 70 y.o. female, returns for routine follow-up and consideration for final cycle of chemotherapy. Lakeyshia was last seen on 11/24/2020.  Due for day #15 of cycle #8 of carboplatin, paclitaxel and Aloxi today.   Today she is accompanied by her husband. Overall, she tells me she has been feeling pretty well. She tolerated the previous treatment well and her skin rash has resolved. The numbness in her feet is stable but denies pain or burning, though she is wondering if she can do anything to reverse the numbness. She denies having ankle swelling, orthopnea or nausea.  She will have the MRI on 03/05 and see Dr. Georgette Dover on 03/08; the surgery will be planned for the final weekend of March. She is planning on doing her radiation in [place].  Overall, she feels ready for final cycle of chemo today.    REVIEW OF SYSTEMS:  Review of Systems  Constitutional: Negative for appetite change and fatigue.  Respiratory: Negative for  shortness of breath.   Cardiovascular: Negative for leg swelling.  Gastrointestinal: Positive for constipation and diarrhea. Negative for nausea.  Skin: Negative for rash.  Neurological: Positive for numbness (feet; stable).    PAST MEDICAL/SURGICAL HISTORY:  Past  Medical History:  Diagnosis Date  . Allergy   . Breast cancer (HCC)   . Cancer (HCC)   . Diverticulitis   . Family history of breast cancer 08/17/2020  . Family history of pancreatic cancer 08/17/2020  . GERD (gastroesophageal reflux disease)   . Hypertension   . Skin cancer    Past Surgical History:  Procedure Laterality Date  . COLONOSCOPY    . PORTACATH PLACEMENT N/A 06/15/2020   Procedure: INSERTION PORT-A-CATH WITH ULTRASOUND GUIDANCE;  Surgeon: Manus Rudd, MD;  Location: [facility];  Service: General;  Laterality: Right;  LMA, LEAVE PORT ACCESSED-CHEMO START 9/1    SOCIAL HISTORY:  Social History   Socioeconomic History  . Marital status: Married    Spouse name: Not on file  . Number of children: Not on file  . Years of education: Not on file  . Highest education level: Not on file  Occupational History  . Occupation: Programme researcher, broadcasting/film/video  Tobacco Use  . Smoking status: Never Smoker  . Smokeless tobacco: Never Used  Substance and Sexual Activity  . Alcohol use: Not Currently  . Drug use: Never  . Sexual activity: Not on file  Other Topics Concern  . Not on file  Social History Narrative  . Not on file   Social Determinants of Health   Financial Resource Strain: Low Risk   . Difficulty of Paying Living Expenses: Not hard at all  Food Insecurity: No Food Insecurity  . Worried About Programme researcher, broadcasting/film/video in the Last Year: Never true  . Ran Out of Food in the Last Year: Never true  Transportation Needs: No Transportation Needs  . Lack of Transportation (Medical): No  . Lack of Transportation (Non-Medical): No  Physical Activity: Sufficiently Active  . Days of Exercise per Week: 7 days  . Minutes of Exercise per Session: 60 min  Stress: Stress Concern Present  . Feeling of Stress : To some extent  Social Connections: Moderately Integrated  . Frequency of Communication with Friends and Family: Once a week  . Frequency of Social Gatherings with  Friends and Family: Never  . Attends Religious Services: More than 4 times per year  . Active Member of Clubs or Organizations: Yes  . Attends Banker Meetings: 1 to 4 times per year  . Marital Status: Married  Catering manager Violence: Not At Risk  . Fear of Current or Ex-Partner: No  . Emotionally Abused: No  . Physically Abused: No  . Sexually Abused: No    FAMILY HISTORY:  Family History  Problem Relation Age of Onset  . Diverticulitis Mother   . Hypertension Maternal Aunt   . Hypertension Maternal Uncle   . Cancer Paternal Grandmother        unknown type; d. 52s  . Breast cancer Cousin 20       paternal   . Pancreatic cancer Cousin        paternal; dx 70s    CURRENT MEDICATIONS:  Current Outpatient Medications  Medication Sig Dispense Refill  . amLODipine-benazepril (LOTREL) 5-20 MG capsule Take 1 capsule by mouth daily.    . Bismuth Subsalicylate (PEPTO-BISMOL PO) Take by mouth as needed.    Marland Kitchen dexamethasone (DECADRON) 4 MG tablet Take  1 tablet daily for 3 days after chemo with food 30 tablet 1  . fexofenadine (ALLEGRA) 180 MG tablet Take 180 mg by mouth daily.    . fluconazole (DIFLUCAN) 100 MG tablet Take 1 tablet (100 mg total) by mouth daily. 7 tablet 0  . ketoconazole (NIZORAL) 2 % cream Apply 1 application topically daily. 30 g 1  . Lactobacillus-Inulin (CULTURELLE DIGESTIVE DAILY) CAPS Take 1 capsule by mouth daily.    Marland Kitchen levothyroxine (SYNTHROID) 100 MCG tablet Take 1 tablet (100 mcg total) by mouth daily before breakfast. 30 tablet 1  . lidocaine-prilocaine (EMLA) cream SMARTSIG:1 Topical Every Night    . LORazepam (ATIVAN) 0.5 MG tablet Take 1 tablet (0.5 mg total) by mouth at bedtime as needed for sleep. 30 tablet 0  . magic mouthwash SOLN Take 5 mLs by mouth 3 (three) times daily as needed for mouth pain. 450 mL 0  . omeprazole (PRILOSEC) 20 MG capsule Take 20 mg by mouth daily.    . ondansetron (ZOFRAN) 8 MG tablet Take 1 tablet (8 mg total) by  mouth 2 (two) times daily as needed. Start on the third day after chemotherapy. 30 tablet 1  . prochlorperazine (COMPAZINE) 10 MG tablet Take 1 tablet (10 mg total) by mouth every 6 (six) hours as needed (Nausea or vomiting). 30 tablet 1   No current facility-administered medications for this visit.   Facility-Administered Medications Ordered in Other Visits  Medication Dose Route Frequency Provider Last Rate Last Admin  . sodium chloride flush (NS) 0.9 % injection 10 mL  10 mL Intracatheter PRN Ladell Pier, MD        ALLERGIES:  Allergies  Allergen Reactions  . Grapefruit Extract Other (See Comments)    Other Reaction: Other reaction  . Pseudoephedrine Hcl Other (See Comments)    Other Reaction: Other reaction  . Nickel   . Pineapple   . Amoxicillin Rash    PHYSICAL EXAM:  Performance status (ECOG): 1 - Symptomatic but completely ambulatory  Vitals:   12/08/20 0904  BP: 126/79  Pulse: 76  Resp: 18  Temp: (!) 97.3 F (36.3 C)  SpO2: 100%   Wt Readings from Last 3 Encounters:  12/08/20 158 lb 9.6 oz (71.9 kg)  12/01/20 160 lb 3.2 oz (72.7 kg)  11/30/20 159 lb 12.8 oz (72.5 kg)   Physical Exam Vitals reviewed.  Constitutional:      Appearance: Normal appearance.  Cardiovascular:     Rate and Rhythm: Normal rate and regular rhythm.     Pulses: Normal pulses.     Heart sounds: Normal heart sounds.  Pulmonary:     Effort: Pulmonary effort is normal.     Breath sounds: Normal breath sounds.  Chest:  Breasts:     Left: Normal. No swelling, bleeding, inverted nipple, mass, nipple discharge, skin change, tenderness or axillary adenopathy.      Comments: Port-a-Cath in R chest Musculoskeletal:     Right lower leg: No edema.     Left lower leg: No edema.  Lymphadenopathy:     Upper Body:     Left upper body: No axillary or pectoral adenopathy.  Neurological:     General: No focal deficit present.     Mental Status: She is alert and oriented to person, place,  and time.  Psychiatric:        Mood and Affect: Mood normal.        Behavior: Behavior normal.     LABORATORY DATA:  I  have reviewed the labs as listed.  CBC Latest Ref Rng & Units 12/08/2020 12/07/2020 12/01/2020  WBC 4.0 - 10.5 K/uL 6.4 3.1(L) 5.7  Hemoglobin 12.0 - 15.0 g/dL 10.8(L) 10.6(L) 11.2(L)  Hematocrit 36.0 - 46.0 % 32.8(L) 32.3(L) 34.8(L)  Platelets 150 - 400 K/uL 186 198 188   CMP Latest Ref Rng & Units 12/08/2020 12/07/2020 11/30/2020  Glucose 70 - 99 mg/dL 66(L) 90 93  BUN 8 - 23 mg/dL $Remove'13 15 15  'DeuPrxv$ Creatinine 0.44 - 1.00 mg/dL 0.58 0.57 0.64  Sodium 135 - 145 mmol/L 138 134(L) 135  Potassium 3.5 - 5.1 mmol/L 3.7 4.2 3.7  Chloride 98 - 111 mmol/L 106 103 109  CO2 22 - 32 mmol/L $RemoveB'27 27 23  'yCmddFlr$ Calcium 8.9 - 10.3 mg/dL 9.0 8.7(L) 8.8(L)  Total Protein 6.5 - 8.1 g/dL 6.1(L) 6.3(L) 6.1(L)  Total Bilirubin 0.3 - 1.2 mg/dL 0.6 0.5 0.4  Alkaline Phos 38 - 126 U/L 78 69 77  AST 15 - 41 U/L $Remo'21 21 23  'egrIw$ ALT 0 - 44 U/L $Remo'19 17 18    'sBKVT$ DIAGNOSTIC IMAGING:  I have independently reviewed the scans and discussed with the patient. No results found.   ASSESSMENT:  1. Stage II (T2N0) left breast TNBC: -Felt left breast mass for 5 months. -Mammogram on 05/07/2020 with suspicious mass in the 10:30 o'clock position in the left breast. Suspicious calcifications in the anterior lower inner quadrant of the left breast. -MRI on 06/05/2020 shows left breast mass measuring 3.8 x 3.3 x 3.2 cm in the upper inner quadrant. 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 No suspicious lymphadenopathy. No MRI evidence of malignancy on the right. -Left breast upper inner quadrant biopsy on 05/14/2020 showed DCIS with calcifications. Right breast biopsy shows fibroadenomatoid nodule with calcifications. ER/PR was negative. -Left breast core needle biopsy at 10:30 position shows invasive ductal carcinoma. ER/PR/HER-2 was negative. Ki-67 85%. Left lymph node biopsy was  reactive. -Patient was evaluated by Dr. [name] and Dr. Georgette Dover. -She received first cycle of chemotherapy with Adriamycin and cyclophosphamide with pembrolizumab ([id number] trial)added for improved PCR by about 15%. -Cycle 1 of Adriamycin, cyclophosphamide and pembrolizumab on9/10/2019.  2. Family history: -Paternal cousin and second cousin had breast cancer. Paternal grandmother also had some type of cancer. -Another paternal cousin died of pancreatic cancer. Another paternal cousin had leukemia.   PLAN:  1. Triple negative left breast cancer: -We held her Beryle Flock since last dose in November.  This is unlikely because of rash. -We have reviewed labs from today which showed white count improved to 6.4 after growth factor injection.  Absolute neutrophil count is normal. -Proceed with last cycle of paclitaxel with dose reduction and carboplatin AUC 1.5. -She is scheduled for MRI and follow-up with Dr. Georgette Dover. -Physical examination today did not reveal any palpable mass in the left breast upper quadrant. -We will see her back 3 to 4 weeks after surgery to initiate Keytruda. -She is considering having her radiation done in [place] but not completely sure yet.  2. Family history: -Genetic testing was negative.  3.Skin rash: -Skin rash completely improved since we cut back on the dose of paclitaxel. -She stopped taking dexamethasone at last visit.  4. High risk drug monitoring: -Continue Synthroid 100 mcg daily. -TSH improved to 9.7.  5. Neuropathy: -Numbness in the feet and fingertips has improved after dose reduction.   Orders placed this encounter:  No orders of the defined types were placed in this encounter.  Derek Jack, MD [place] [phone]   I, Milinda Antis, am acting as a scribe for Dr. Sanda Linger.  I, Derek Jack MD, have reviewed the above documentation for accuracy and completeness, and I agree with  the above.

## 2020-12-08 NOTE — Progress Notes (Signed)
Patient was assessed by Dr. Katragadda and labs have been reviewed.  Patient is okay to proceed with treatment today. Primary RN and pharmacy aware.   

## 2020-12-18 ENCOUNTER — Other Ambulatory Visit: Payer: Self-pay

## 2020-12-18 ENCOUNTER — Ambulatory Visit
Admission: RE | Admit: 2020-12-18 | Discharge: 2020-12-18 | Disposition: A | Discharge status: Home / Self Care | Payer: BC Managed Care – PPO | Source: Ambulatory Visit | Attending: Surgery | Admitting: Surgery

## 2020-12-18 DIAGNOSIS — C50912 Malignant neoplasm of unspecified site of left female breast: Secondary | ICD-10-CM

## 2020-12-18 IMAGING — MR MR BREAST BILAT WO/W CM
8 of 12 series · 33 of 48 positions shown · IV contrast (6ml Gadavist)
Comparison: [DATE]

CLINICAL DATA: 69-year-old female for evaluation of LEFT breast
malignancy response following neoadjuvant treatment.

LABS:  None performed today
EXAM:
BILATERAL BREAST MRI WITH AND WITHOUT CONTRAST
TECHNIQUE: Multiplanar, multisequence MR images of both breasts were obtained
prior to and following the intravenous administration of 7 ml of
Gadavist

[Series 2: t2_tirm_tra ipat (a-p) · axial · 3.0mm · 0.70mm/px · 1 of 55 slices shown]
[im 1/55]
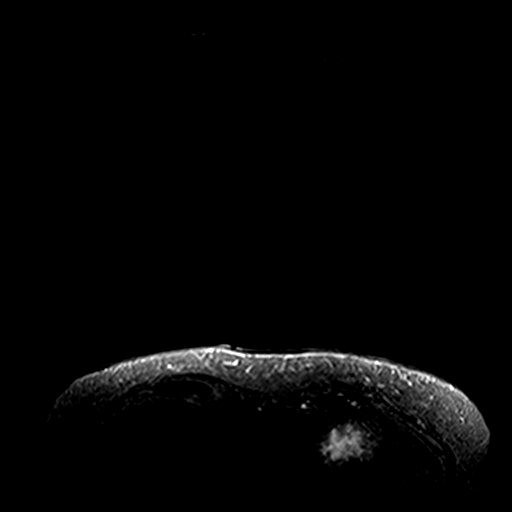

[Series 3: fl3d pre-cm no · axial · non-contrast · 1.2mm · 0.94mm/px · z∈[-78,+93]mm · 5 of 144 slices shown]
[im 1/144]
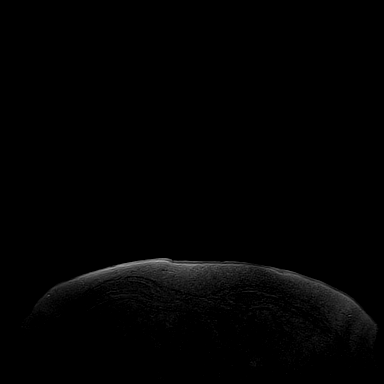
[im 36/144]
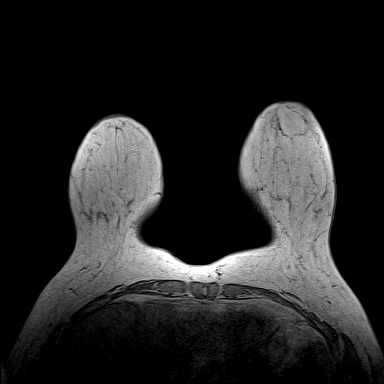
[im 72/144]
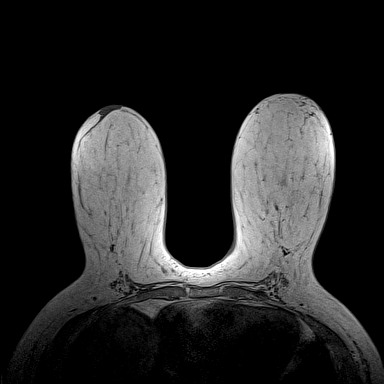
[im 108/144]
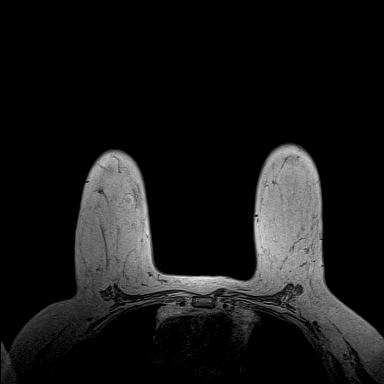
[im 144/144]
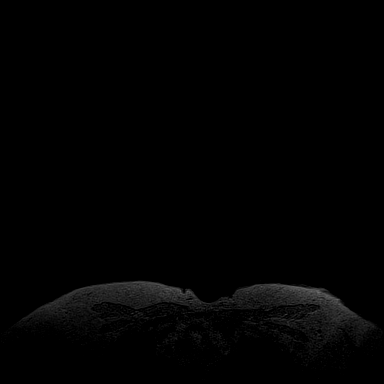

[Series 4: fl3d pre-cm · axial · non-contrast · 1.2mm · 0.94mm/px · z∈[-78,+93]mm · 5 of 144 slices shown]
[im 1/144]
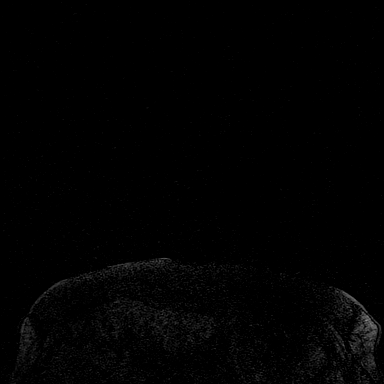
[im 36/144]
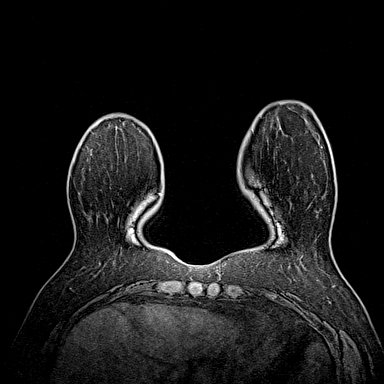
[im 72/144]
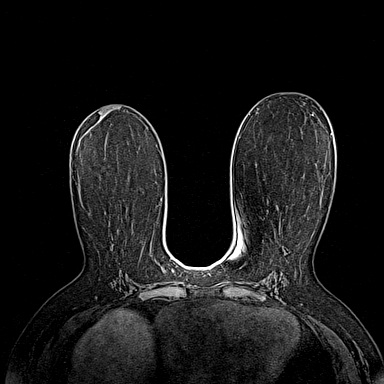
[im 108/144]
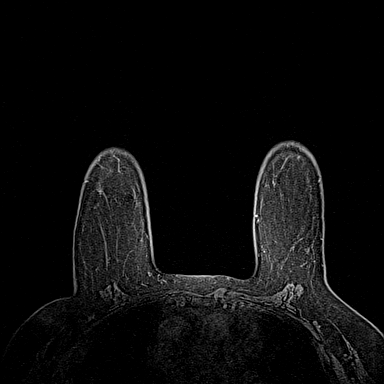
[im 144/144]
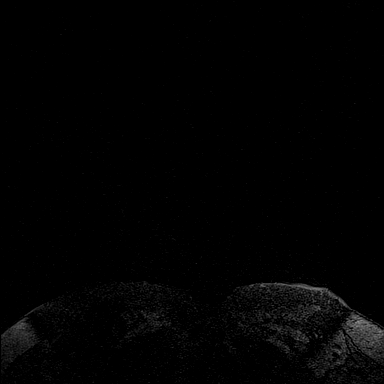

[Series 5: fl3d post-cm 20 · axial · 1.2mm · 0.94mm/px · z∈[-78,+93]mm · 5 of 144 slices shown (1 of 3)]
[im 1/144]
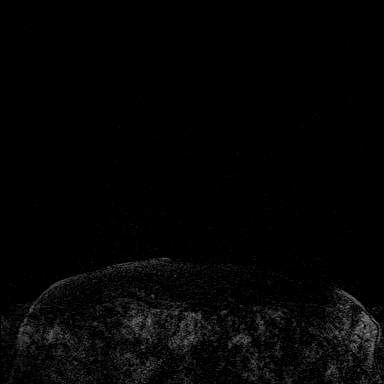
[im 36/144]
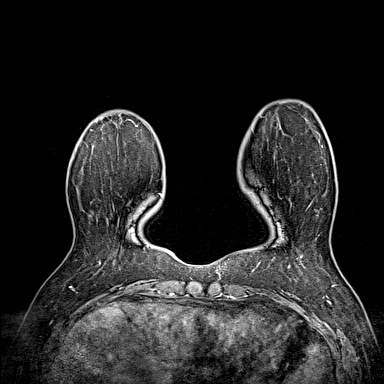
[im 72/144]
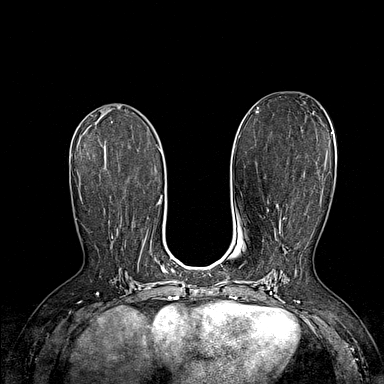
[im 108/144]
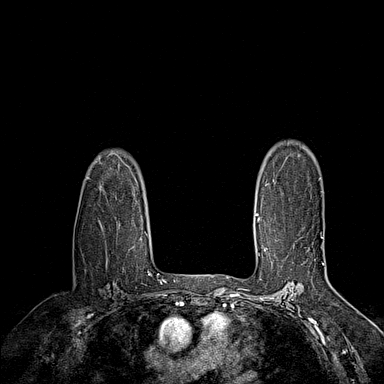
[im 144/144]
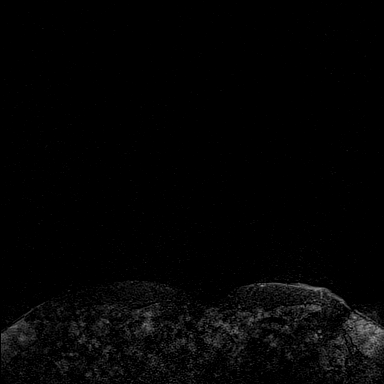

[Series 6: fl3d post-cm 20 · axial · 1.2mm · 0.94mm/px · z∈[-78,+93]mm · 5 of 144 slices shown (2 of 3)]
[im 1/144]
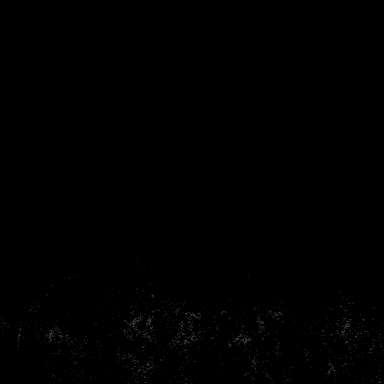
[im 36/144]
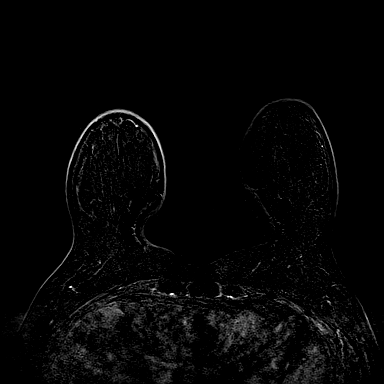
[im 72/144]
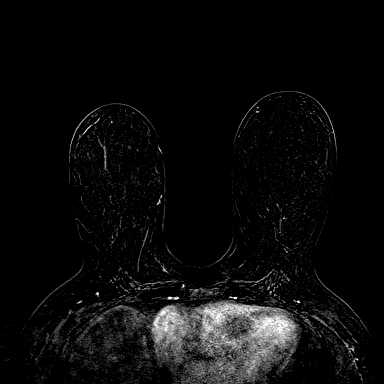
[im 108/144]
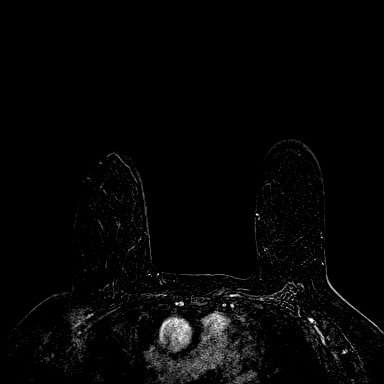
[im 144/144]
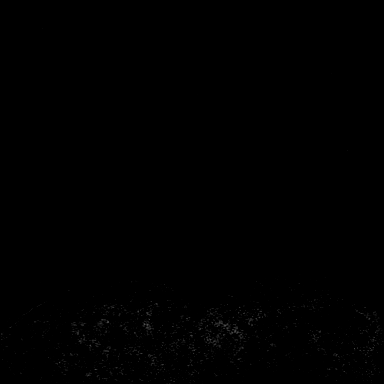

[Series 7: fl3d post-cm 20 · axial · 172.8mm · 0.94mm/px · 1 of 1 slices shown (3 of 3)]
[im 1/1]
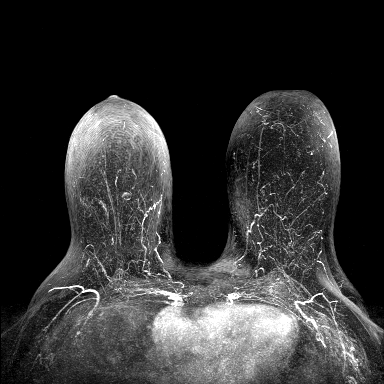

[Series 8: fl3d post-cm 3min · axial · 1.2mm · 0.94mm/px · z∈[-78,+93]mm · 6 of 144 slices shown]
[im 1/144]
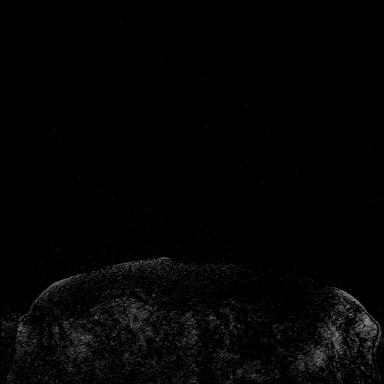
[im 29/144]
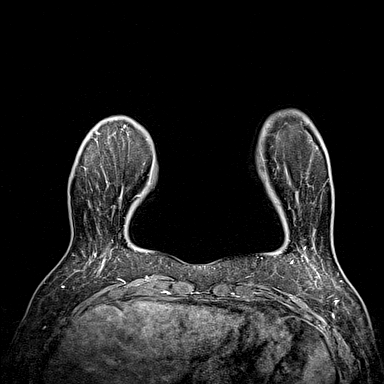
[im 58/144]
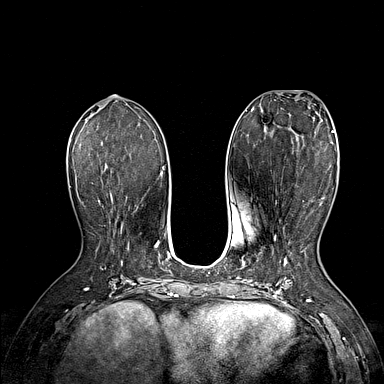
[im 86/144]
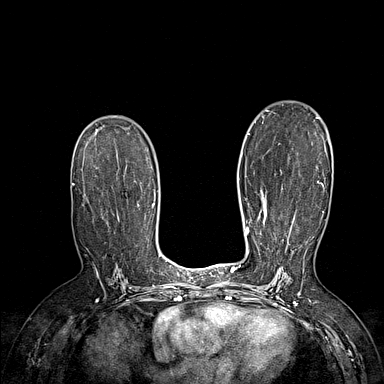
[im 115/144]
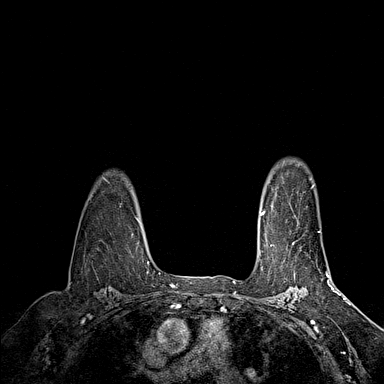
[im 144/144]
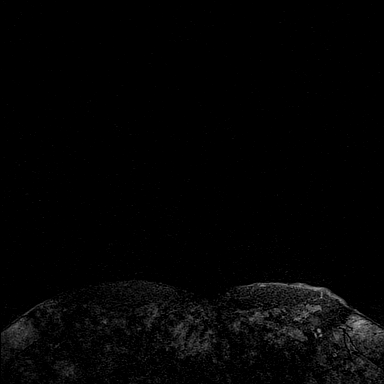

[Series 9: fl3d post-cm 3min_sub · axial · 1.2mm · 0.94mm/px · z∈[-78,+58]mm · 5 of 144 slices shown]
[im 1/144]
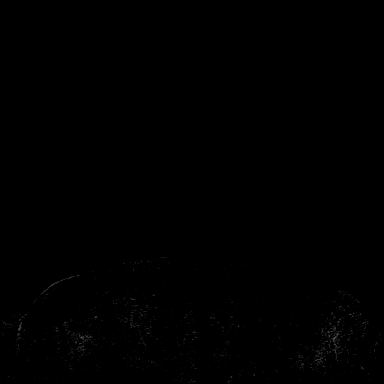
[im 29/144]
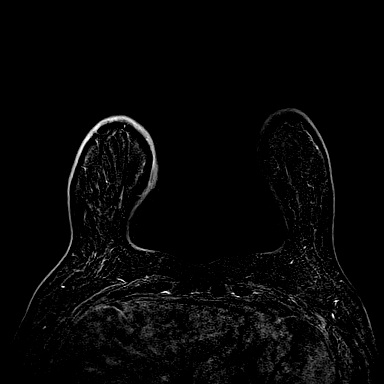
[im 58/144]
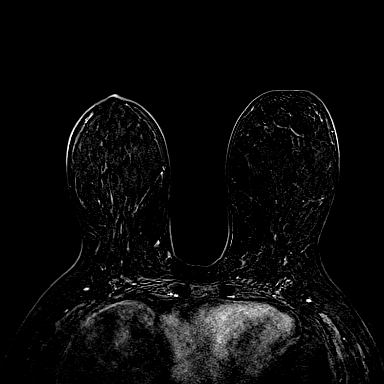
[im 86/144]
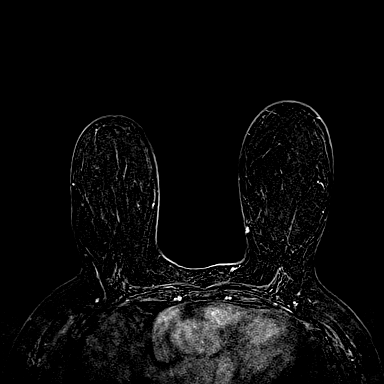
[im 115/144]
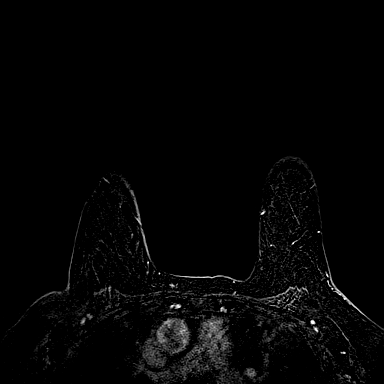

[33 of 48 positions shown; findings below may reference images not displayed]

Three-dimensional MR images were rendered by post-processing of the
original MR data on an independent workstation. The
three-dimensional MR images were interpreted, and findings are
reported in the following complete MRI report for this study. Three
dimensional images were evaluated at the independent interpreting
workstation using the DynaCAD thin client.
FINDINGS: Breast composition: a. Almost entirely fat.

Background parenchymal enhancement: Mild

Right breast: No mass or abnormal enhancement. Biopsy clip artifact
within the UPPER INNER RIGHT breast again noted.

Left breast: A 0.5 x 2.7 cm area of residual nonenhancing soft
tissue containing biopsy clip artifact within the UPPER INNER LEFT
breast, at the site of known malignancy.

No abnormal areas of enhancement within the LEFT breast are noted.

An additional biopsy clip artifact within the anterior INNER LEFT
breast is again noted.

Lymph nodes: No abnormal appearing lymph nodes.

Ancillary findings:  None.
IMPRESSION: 1. Treatment response with no abnormal enhancement at the site of
known UPPER INNER LEFT breast malignancy. Only a 0.5 x 2.7 cm area
of nonenhancing residual soft tissue is noted at the site of
previous 3.8 x 3.3 x 3.2 cm enhancing mass on [DATE] MR.
2. No MR evidence of RIGHT breast malignancy.
3. No abnormal appearing lymph nodes.

RECOMMENDATION:
Treatment plan

BI-RADS CATEGORY  6: Known biopsy-proven malignancy.

## 2020-12-18 MED ORDER — GADOBUTROL 1 MMOL/ML IV SOLN
6.0000 mL | Freq: Once | INTRAVENOUS | Status: AC | PRN
  Administered 2020-12-18: 6 mL via INTRAVENOUS

## 2020-12-21 ENCOUNTER — Ambulatory Visit: Payer: Self-pay | Admitting: Surgery

## 2020-12-22 ENCOUNTER — Ambulatory Visit: Payer: Self-pay | Admitting: Surgery

## 2020-12-22 DIAGNOSIS — C50912 Malignant neoplasm of unspecified site of left female breast: Secondary | ICD-10-CM

## 2020-12-22 NOTE — H&P (View-Only) (Signed)
Jo Mills is an 70 y.o. female.    Oncology - Delton Coombes Chief Complaint: Left breast cancer s/p neoadjuvant chemo HPI: PCP - Valentina Shaggy NP Reason for eval - new diagnosis left breast IDC/ DCIS  This is a 70 year old female in good health who presents with a palpable mass in her left breast. The mass has been present for at least 5 months. She has not had a mammogram since 2013. Mammogram and ultrasound showed 2 areas in the left upper inner quadrant of concern. The palpable mass is 2.7 x 2.6 x 2.5 cm located at 10:30 10 centimeters from the nipple. 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  A thickened lymph node was biopsied and was benign.  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  The patient underwent neoadjuvant chemotherapy in [place].  She had a follow-up MRI performed yesterday that showed excellent response.  There is no abnormal enhancement remaining in the upper inner left quadrant.  There is a 0.5 x 2.7 cm area of non-mass enhancement at the site of the previous malignancy.  Lymph nodes appeared normal.  Patient seemed to tolerate her chemotherapy well.  She presents now to discuss upcoming surgery.  Past Medical History:  Diagnosis Date  . Allergy   . Breast cancer ([place])   . Cancer ([place])   . Diverticulitis   . Family history of breast cancer 08/17/2020  . Family history of pancreatic cancer 08/17/2020  . GERD (gastroesophageal reflux disease)   . Hypertension   . Skin cancer     Past Surgical History:  Procedure Laterality Date  . COLONOSCOPY    . PORTACATH PLACEMENT N/A 06/15/2020   Procedure: INSERTION PORT-A-CATH WITH ULTRASOUND GUIDANCE;  Surgeon: Donnie Mesa, MD;  Location: [place];  Service: General;  Laterality: Right;  LMA, LEAVE PORT ACCESSED-CHEMO START 9/1     Family History  Problem Relation Age of Onset  . Diverticulitis Mother   . Hypertension Maternal Aunt   . Hypertension Maternal Uncle   . Cancer Paternal Grandmother        unknown type; d. 81s  . Breast cancer Cousin 20       paternal   . Pancreatic cancer Cousin        paternal; dx 64s   Social History:  reports that she has never smoked. She has never used smokeless tobacco. She reports previous alcohol use. She reports that she does not use drugs.  Allergies:  Allergies  Allergen Reactions  . Grapefruit Extract Other (See Comments)    Other Reaction: Other reaction  . Pseudoephedrine Hcl Other (See Comments)    Other Reaction: Other reaction  . Nickel   . Pineapple   . Amoxicillin Rash    Prior to Admission medications   Medication Sig Start Date End Date Taking? Authorizing Provider  amLODipine-benazepril (LOTREL) 5-20 MG capsule Take 1 capsule by mouth daily.    [provider]  Bismuth Subsalicylate (PEPTO-BISMOL PO) Take by mouth as needed.    [provider]  dexamethasone (DECADRON) 4 MG tablet Take 1 tablet daily for 3 days after chemo with food 10/13/20   Derek Jack, MD  fexofenadine (ALLEGRA) 180 MG tablet Take 180 mg by mouth daily.    [provider]  fluconazole (DIFLUCAN) 100 MG tablet Take 1 tablet (100 mg total) by mouth  daily. 09/22/20   Serena Croissant, MD  ketoconazole (NIZORAL) 2 % cream Apply 1 application topically daily. 09/22/20   Serena Croissant, MD  Lactobacillus-Inulin (CULTURELLE DIGESTIVE DAILY) CAPS Take 1 capsule by mouth daily.    [provider]  levothyroxine (SYNTHROID) 100 MCG tablet Take 1 tablet (100 mcg total) by mouth daily before breakfast. 11/26/20   [name], MD  lidocaine-prilocaine (EMLA) cream SMARTSIG:1 Topical Every Night 10/18/20   [provider]  LORazepam (ATIVAN) 0.5 MG tablet Take 1 tablet (0.5 mg total) by mouth at bedtime as needed for sleep. 06/08/20    Serena Croissant, MD  magic mouthwash SOLN Take 5 mLs by mouth 3 (three) times daily as needed for mouth pain. 09/20/20   [name], MD  omeprazole (PRILOSEC) 20 MG capsule Take 20 mg by mouth daily.    [provider]  ondansetron (ZOFRAN) 8 MG tablet Take 1 tablet (8 mg total) by mouth 2 (two) times daily as needed. Start on the third day after chemotherapy. 06/08/20   Serena Croissant, MD  prochlorperazine (COMPAZINE) 10 MG tablet Take 1 tablet (10 mg total) by mouth every 6 (six) hours as needed (Nausea or vomiting). 06/08/20   Serena Croissant, MD    No results found for this or any previous visit (from the past 48 hour(s)). No results found.  Review of Systems General Not Present- Appetite Loss, Chills, Fatigue, Fever, Night Sweats, Weight Gain and Weight Loss. Skin Present- Dryness. Not Present- Change in Wart/Mole, Hives, Jaundice, New Lesions, Non-Healing Wounds, Rash and Ulcer. HEENT Present- Seasonal Allergies and Wears glasses/contact lenses. Not Present- Earache, Hearing Loss, Hoarseness, Nose Bleed, Oral Ulcers, Ringing in the Ears, Sinus Pain, Sore Throat, Visual Disturbances and Yellow Eyes. Respiratory Not Present- Bloody sputum, Chronic Cough, Difficulty Breathing, Snoring and Wheezing. Breast Present- Breast Mass. Not Present- Breast Pain, Nipple Discharge and Skin Changes. Cardiovascular Not Present- Chest Pain, Difficulty Breathing Lying Down, Leg Cramps, Palpitations, Rapid Heart Rate, Shortness of Breath and Swelling of Extremities. Gastrointestinal Not Present- Abdominal Pain, Bloating, Bloody Stool, Change in Bowel Habits, Chronic diarrhea, Constipation, Difficulty Swallowing, Excessive gas, Gets full quickly at meals, Hemorrhoids, Indigestion, Nausea, Rectal Pain and Vomiting. Female Genitourinary Not Present- Frequency, Nocturia, Painful Urination, Pelvic Pain and Urgency. Musculoskeletal Not Present- Back Pain, Joint Pain, Joint Stiffness, Muscle Pain, Muscle  Weakness and Swelling of Extremities. Neurological Not Present- Decreased Memory, Fainting, Headaches, Numbness, Seizures, Tingling, Tremor, Trouble walking and Weakness. Psychiatric Not Present- Anxiety, Bipolar, Change in Sleep Pattern, Depression, Fearful and Frequent crying. Endocrine Present- Hot flashes. Not Present- Cold Intolerance, Excessive Hunger, Hair Changes, Heat Intolerance and New Diabetes. Hematology Not Present- Blood Thinners, Easy Bruising, Excessive bleeding, Gland problems, HIV and Persistent Infections. There were no vitals taken for this visit.    Physical Exam  Constitutional: WDWN in NAD, conversant, no obvious deformities; resting comfortably Eyes: Pupils equal, round; sclera anicteric; moist conjunctiva; no lid lag HENT: Oral mucosa moist; good dentition Neck: No masses palpated, trachea midline; no thyromegaly Lungs: CTA bilaterally; normal respiratory effort Breasts: symmetric; no nipple changes or discharge; no palpable lymphadenopathy in either axilla or [place] area; no right breast masses; left breast at 10:30 at edge of breast no residual palpable mass CV: Regular rate and rhythm; no murmurs; extremities well-perfused with no edema Abd: +bowel sounds, soft, non-tender, no palpable organomegaly; no palpable hernias Musc: Normal gait; no apparent clubbing or cyanosis in extremities Lymphatic: No palpable cervical or axillary lymphadenopathy Skin: Warm, dry; no sign of jaundice  Psychiatric - alert and oriented x 4; calm mood and affect   Assessment/Plan 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  We will also excised the previous taken axillary lymph node and perform sentinel lymph node biopsy.  The surgical procedure has been discussed with the patient.  Potential risks, benefits,  alternative treatments, and expected outcomes have been explained.  All of the patient's questions at this time have been answered.  The likelihood of reaching the patient's treatment goal is good.  The patient understand the proposed surgical procedure and wishes to proceed.   Imogene Burn. Georgette Dover, MD, [facility] Surgery  General/ Trauma Surgery   12/22/2020 10:59 AM

## 2020-12-22 NOTE — H&P (Signed)
Jo Mills is an 70 y.o. female.    Oncology - Delton Coombes Chief Complaint: Left breast cancer s/p neoadjuvant chemo HPI: PCP - Valentina Shaggy NP Reason for eval - new diagnosis left breast IDC/ DCIS  This is a 70 year old female in good health who presents with a palpable mass in her left breast. The mass has been present for at least 5 months. She has not had a mammogram since 2013. Mammogram and ultrasound showed 2 areas in the left upper inner quadrant of concern. The palpable mass is 2.7 x 2.6 x 2.5 cm located at 10:30 10 centimeters from the nipple. 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  A thickened lymph node was biopsied and was benign.  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  The patient underwent neoadjuvant chemotherapy in [place].  She had a follow-up MRI performed yesterday that showed excellent response.  There is no abnormal enhancement remaining in the upper inner left quadrant.  There is a 0.5 x 2.7 cm area of non-mass enhancement at the site of the previous malignancy.  Lymph nodes appeared normal.  Patient seemed to tolerate her chemotherapy well.  She presents now to discuss upcoming surgery.  Past Medical History:  Diagnosis Date  . Allergy   . Breast cancer ([place])   . Cancer ([place])   . Diverticulitis   . Family history of breast cancer 08/17/2020  . Family history of pancreatic cancer 08/17/2020  . GERD (gastroesophageal reflux disease)   . Hypertension   . Skin cancer     Past Surgical History:  Procedure Laterality Date  . COLONOSCOPY    . PORTACATH PLACEMENT N/A 06/15/2020   Procedure: INSERTION PORT-A-CATH WITH ULTRASOUND GUIDANCE;  Surgeon: Donnie Mesa, MD;  Location: [place];  Service: General;  Laterality: Right;  LMA, LEAVE PORT ACCESSED-CHEMO START 9/1     Family History  Problem Relation Age of Onset  . Diverticulitis Mother   . Hypertension Maternal Aunt   . Hypertension Maternal Uncle   . Cancer Paternal Grandmother        unknown type; d. 47s  . Breast cancer Cousin 36       paternal   . Pancreatic cancer Cousin        paternal; dx 85s   Social History:  reports that she has never smoked. She has never used smokeless tobacco. She reports previous alcohol use. She reports that she does not use drugs.  Allergies:  Allergies  Allergen Reactions  . Grapefruit Extract Other (See Comments)    Other Reaction: Other reaction  . Pseudoephedrine Hcl Other (See Comments)    Other Reaction: Other reaction  . Nickel   . Pineapple   . Amoxicillin Rash    Prior to Admission medications   Medication Sig Start Date End Date Taking? Authorizing Provider  amLODipine-benazepril (LOTREL) 5-20 MG capsule Take 1 capsule by mouth daily.    [provider]  Bismuth Subsalicylate (PEPTO-BISMOL PO) Take by mouth as needed.    [provider]  dexamethasone (DECADRON) 4 MG tablet Take 1 tablet daily for 3 days after chemo with food 10/13/20   Derek Jack, MD  fexofenadine (ALLEGRA) 180 MG tablet Take 180 mg by mouth daily.    [provider]  fluconazole (DIFLUCAN) 100 MG tablet Take 1 tablet (100 mg total) by mouth  daily. 09/22/20   Nicholas Lose, MD  ketoconazole (NIZORAL) 2 % cream Apply 1 application topically daily. 09/22/20   Nicholas Lose, MD  Lactobacillus-Inulin (CULTURELLE DIGESTIVE DAILY) CAPS Take 1 capsule by mouth daily.    [provider]  levothyroxine (SYNTHROID) 100 MCG tablet Take 1 tablet (100 mcg total) by mouth daily before breakfast. 11/26/20   Derek Jack, MD  lidocaine-prilocaine (EMLA) cream SMARTSIG:1 Topical Every Night 10/18/20   [provider]  LORazepam (ATIVAN) 0.5 MG tablet Take 1 tablet (0.5 mg total) by mouth at bedtime as needed for sleep. 06/08/20    Nicholas Lose, MD  magic mouthwash SOLN Take 5 mLs by mouth 3 (three) times daily as needed for mouth pain. 09/20/20   Derek Jack, MD  omeprazole (PRILOSEC) 20 MG capsule Take 20 mg by mouth daily.    [provider]  ondansetron (ZOFRAN) 8 MG tablet Take 1 tablet (8 mg total) by mouth 2 (two) times daily as needed. Start on the third day after chemotherapy. 06/08/20   Nicholas Lose, MD  prochlorperazine (COMPAZINE) 10 MG tablet Take 1 tablet (10 mg total) by mouth every 6 (six) hours as needed (Nausea or vomiting). 06/08/20   Nicholas Lose, MD    No results found for this or any previous visit (from the past 48 hour(s)). No results found.  Review of Systems General Not Present- Appetite Loss, Chills, Fatigue, Fever, Night Sweats, Weight Gain and Weight Loss. Skin Present- Dryness. Not Present- Change in Wart/Mole, Hives, Jaundice, New Lesions, Non-Healing Wounds, Rash and Ulcer. HEENT Present- Seasonal Allergies and Wears glasses/contact lenses. Not Present- Earache, Hearing Loss, Hoarseness, Nose Bleed, Oral Ulcers, Ringing in the Ears, Sinus Pain, Sore Throat, Visual Disturbances and Yellow Eyes. Respiratory Not Present- Bloody sputum, Chronic Cough, Difficulty Breathing, Snoring and Wheezing. Breast Present- Breast Mass. Not Present- Breast Pain, Nipple Discharge and Skin Changes. Cardiovascular Not Present- Chest Pain, Difficulty Breathing Lying Down, Leg Cramps, Palpitations, Rapid Heart Rate, Shortness of Breath and Swelling of Extremities. Gastrointestinal Not Present- Abdominal Pain, Bloating, Bloody Stool, Change in Bowel Habits, Chronic diarrhea, Constipation, Difficulty Swallowing, Excessive gas, Gets full quickly at meals, Hemorrhoids, Indigestion, Nausea, Rectal Pain and Vomiting. Female Genitourinary Not Present- Frequency, Nocturia, Painful Urination, Pelvic Pain and Urgency. Musculoskeletal Not Present- Back Pain, Joint Pain, Joint Stiffness, Muscle Pain, Muscle  Weakness and Swelling of Extremities. Neurological Not Present- Decreased Memory, Fainting, Headaches, Numbness, Seizures, Tingling, Tremor, Trouble walking and Weakness. Psychiatric Not Present- Anxiety, Bipolar, Change in Sleep Pattern, Depression, Fearful and Frequent crying. Endocrine Present- Hot flashes. Not Present- Cold Intolerance, Excessive Hunger, Hair Changes, Heat Intolerance and New Diabetes. Hematology Not Present- Blood Thinners, Easy Bruising, Excessive bleeding, Gland problems, HIV and Persistent Infections. There were no vitals taken for this visit.    Physical Exam  Constitutional: WDWN in NAD, conversant, no obvious deformities; resting comfortably Eyes: Pupils equal, round; sclera anicteric; moist conjunctiva; no lid lag HENT: Oral mucosa moist; good dentition Neck: No masses palpated, trachea midline; no thyromegaly Lungs: CTA bilaterally; normal respiratory effort Breasts: symmetric; no nipple changes or discharge; no palpable lymphadenopathy in either axilla or [place] area; no right breast masses; left breast at 10:30 at edge of breast no residual palpable mass CV: Regular rate and rhythm; no murmurs; extremities well-perfused with no edema Abd: +bowel sounds, soft, non-tender, no palpable organomegaly; no palpable hernias Musc: Normal gait; no apparent clubbing or cyanosis in extremities Lymphatic: No palpable cervical or axillary lymphadenopathy Skin: Warm, dry; no sign of jaundice  Psychiatric - alert and oriented x 4; calm mood and affect   Assessment/Plan 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  We will also excised the previous taken axillary lymph node and perform sentinel lymph node biopsy.  The surgical procedure has been discussed with the patient.  Potential risks, benefits,  alternative treatments, and expected outcomes have been explained.  All of the patient's questions at this time have been answered.  The likelihood of reaching the patient's treatment goal is good.  The patient understand the proposed surgical procedure and wishes to proceed.   Imogene Burn. Georgette Dover, MD, [facility] Surgery  General/ Trauma Surgery   12/22/2020 10:59 AM

## 2020-12-27 ENCOUNTER — Other Ambulatory Visit: Payer: Self-pay | Admitting: Surgery

## 2020-12-27 DIAGNOSIS — C50912 Malignant neoplasm of unspecified site of left female breast: Secondary | ICD-10-CM

## 2021-01-06 ENCOUNTER — Encounter (HOSPITAL_COMMUNITY): Payer: Self-pay | Admitting: Lab

## 2021-01-06 ENCOUNTER — Other Ambulatory Visit: Payer: Self-pay

## 2021-01-06 ENCOUNTER — Encounter (HOSPITAL_BASED_OUTPATIENT_CLINIC_OR_DEPARTMENT_OTHER): Payer: Self-pay | Admitting: Surgery

## 2021-01-06 NOTE — Progress Notes (Unsigned)
Referral sent to [facility].  Records faxed on 3/24

## 2021-01-08 ENCOUNTER — Other Ambulatory Visit (HOSPITAL_COMMUNITY): Payer: BC Managed Care – PPO

## 2021-01-10 ENCOUNTER — Other Ambulatory Visit (HOSPITAL_COMMUNITY)
Admission: RE | Admit: 2021-01-10 | Discharge: 2021-01-10 | Disposition: A | Discharge status: Home / Self Care | Payer: BC Managed Care – PPO | Source: Ambulatory Visit | Attending: Surgery | Admitting: Surgery

## 2021-01-10 ENCOUNTER — Other Ambulatory Visit (HOSPITAL_COMMUNITY): Payer: BC Managed Care – PPO

## 2021-01-10 DIAGNOSIS — N6032 Fibrosclerosis of left breast: Secondary | ICD-10-CM | POA: Diagnosis not present

## 2021-01-10 DIAGNOSIS — Z20822 Contact with and (suspected) exposure to covid-19: Secondary | ICD-10-CM | POA: Insufficient documentation

## 2021-01-10 DIAGNOSIS — Z888 Allergy status to other drugs, medicaments and biological substances status: Secondary | ICD-10-CM | POA: Diagnosis not present

## 2021-01-10 DIAGNOSIS — Z171 Estrogen receptor negative status [ER-]: Secondary | ICD-10-CM | POA: Diagnosis not present

## 2021-01-10 DIAGNOSIS — Z91018 Allergy to other foods: Secondary | ICD-10-CM | POA: Diagnosis not present

## 2021-01-10 DIAGNOSIS — Z9221 Personal history of antineoplastic chemotherapy: Secondary | ICD-10-CM | POA: Diagnosis not present

## 2021-01-10 DIAGNOSIS — D0512 Intraductal carcinoma in situ of left breast: Secondary | ICD-10-CM | POA: Diagnosis present

## 2021-01-10 DIAGNOSIS — Z01812 Encounter for preprocedural laboratory examination: Secondary | ICD-10-CM | POA: Insufficient documentation

## 2021-01-10 DIAGNOSIS — Z880 Allergy status to penicillin: Secondary | ICD-10-CM | POA: Diagnosis not present

## 2021-01-10 DIAGNOSIS — Z803 Family history of malignant neoplasm of breast: Secondary | ICD-10-CM | POA: Diagnosis not present

## 2021-01-10 DIAGNOSIS — Z79899 Other long term (current) drug therapy: Secondary | ICD-10-CM | POA: Diagnosis not present

## 2021-01-11 ENCOUNTER — Ambulatory Visit
Admission: RE | Admit: 2021-01-11 | Discharge: 2021-01-11 | Disposition: A | Discharge status: Home / Self Care | Payer: BC Managed Care – PPO | Source: Ambulatory Visit | Attending: Surgery | Admitting: Surgery

## 2021-01-11 ENCOUNTER — Other Ambulatory Visit: Payer: Self-pay | Admitting: Surgery

## 2021-01-11 ENCOUNTER — Other Ambulatory Visit: Payer: Self-pay

## 2021-01-11 DIAGNOSIS — C50912 Malignant neoplasm of unspecified site of left female breast: Secondary | ICD-10-CM

## 2021-01-11 LAB — SARS CORONAVIRUS 2 (TAT 6-24 HRS): SARS Coronavirus 2: NEGATIVE

## 2021-01-11 IMAGING — MG MM PLC BREAST LOC DEV 1ST LESION INC MAMMO GUIDE*L*
8 of 10 series · 8 of 10 positions shown · non-contrast
Comparison: Previous exam(s).

CLINICAL DATA: Localization in the left breast at the site of the
patient's known invasive malignancy posteriorly and the patient has
known DCIS anteriorly

EXAM:
MAMMOGRAPHIC GUIDED RADIOACTIVE SEED LOCALIZATION OF THE LEFT BREAST

[L ML (1 of 4)]
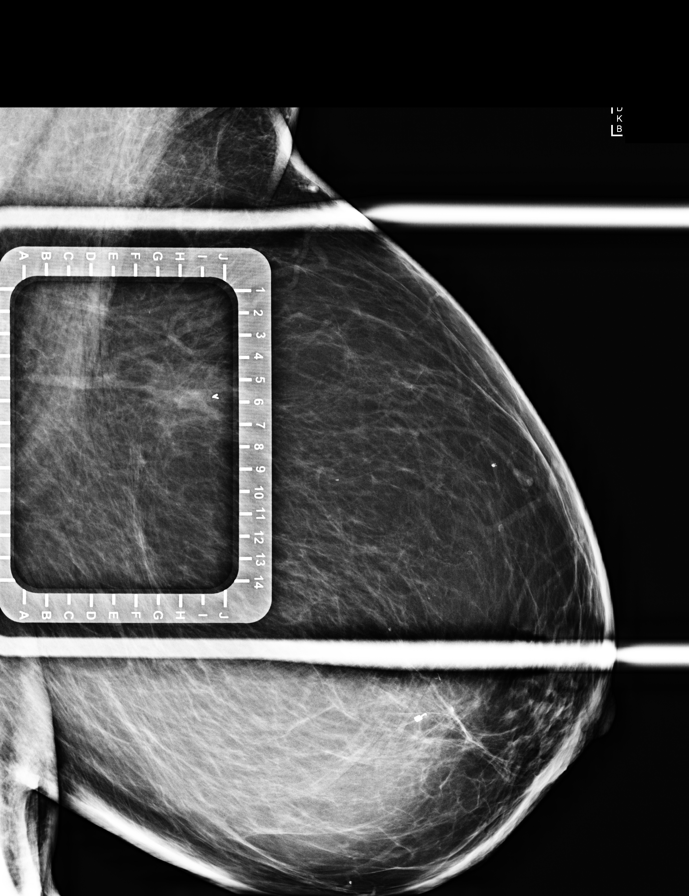

[L CC (1 of 4)]
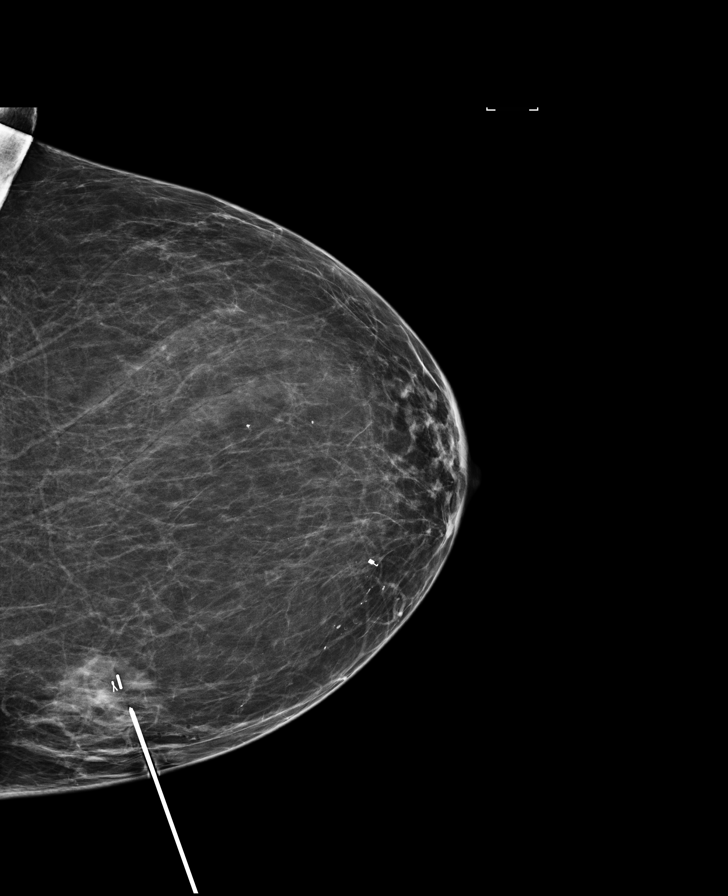

[L CC (2 of 4)]
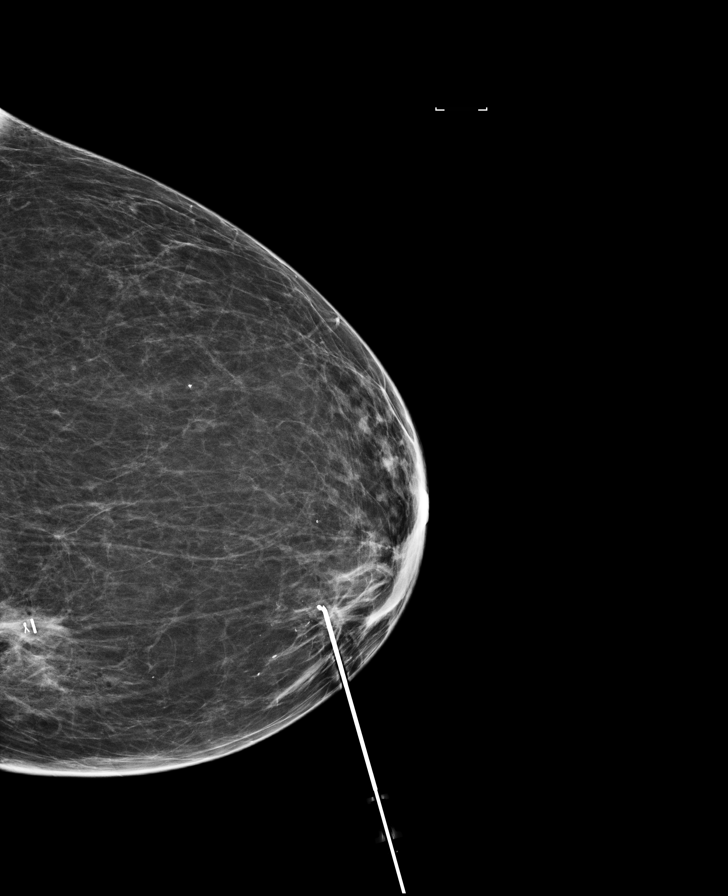

[L CC (3 of 4)]
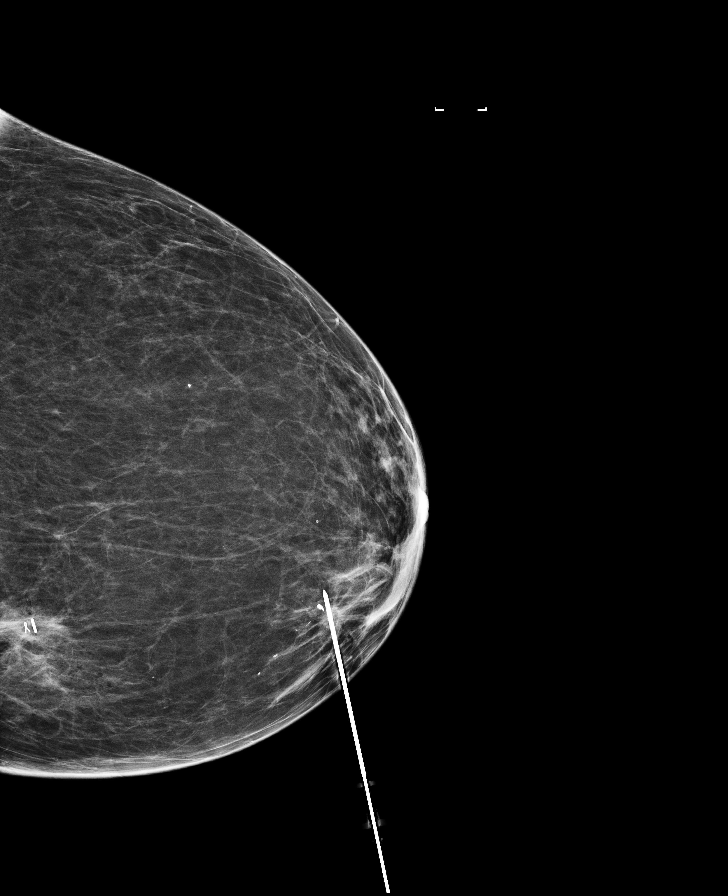

[L ML (2 of 4)]
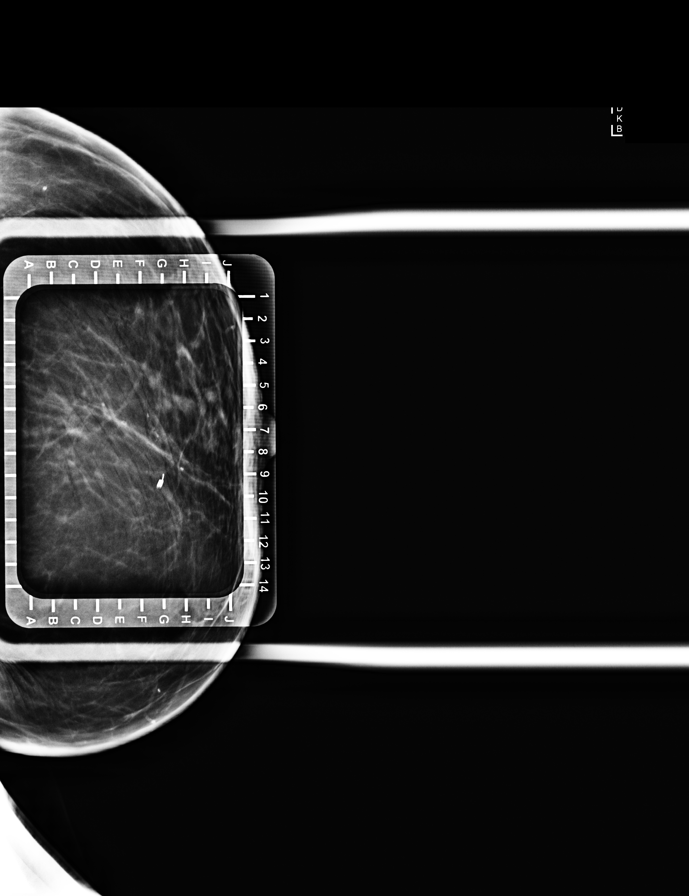

[L CC (4 of 4)]
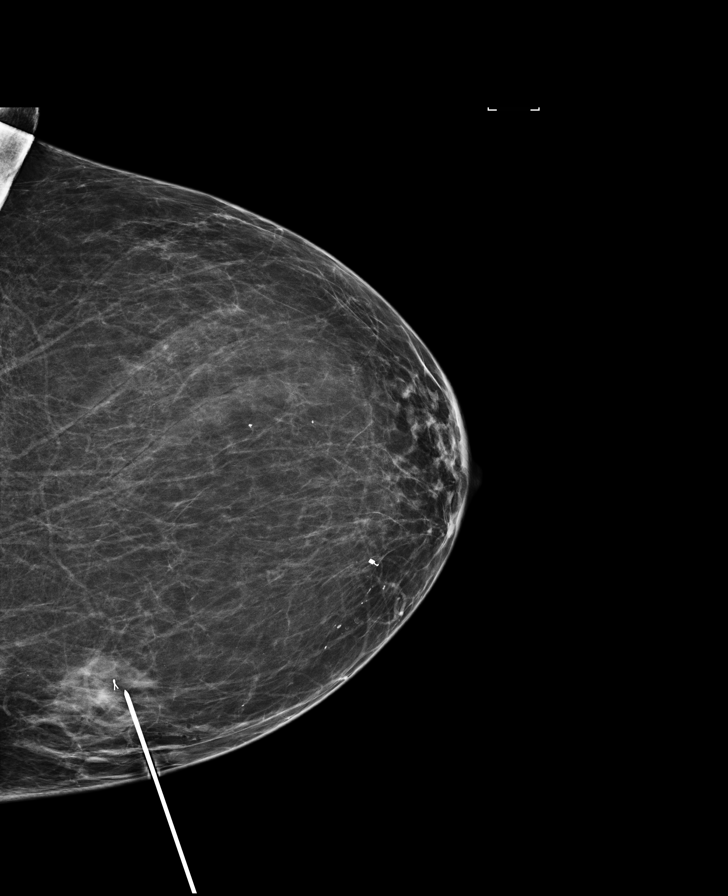

[L ML (3 of 4)]
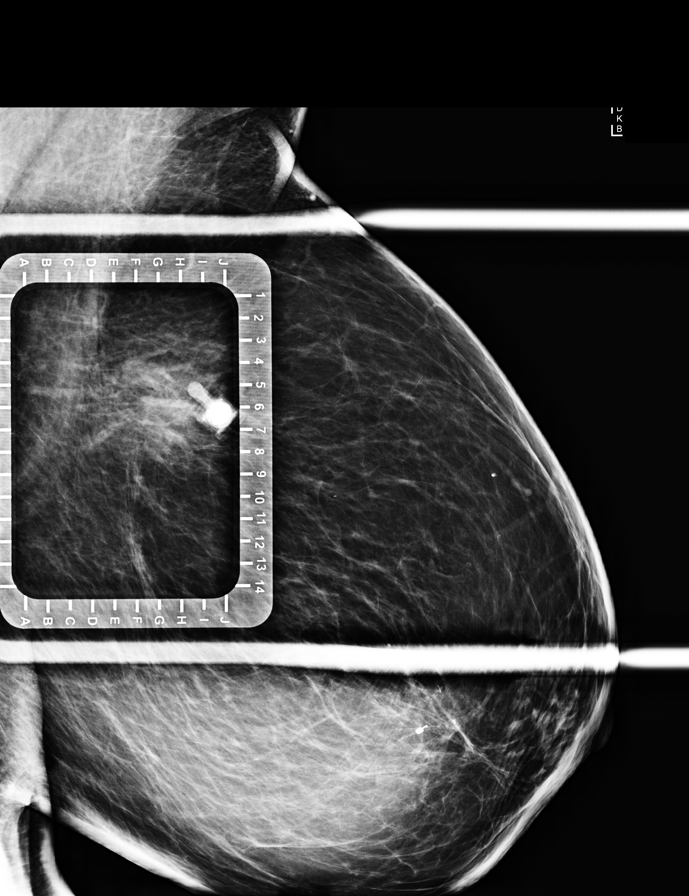

[L ML (4 of 4)]
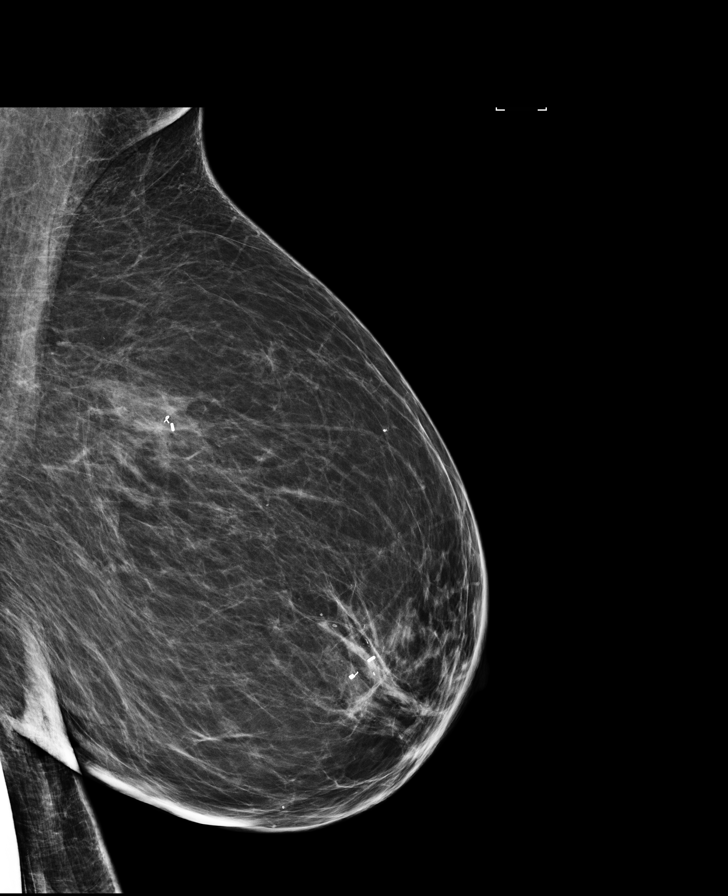

[8 of 10 positions shown; findings below may reference images not displayed]



The usual time-out protocol was performed immediately prior to the
procedure.

Using mammographic guidance, sterile technique, 1% lidocaine and an
[PI] radioactive seed, the ribbon shaped clip at the site of the
patient's treated invasive malignancy in the posteromedial left
breast was localized using a medial approach. The follow-up
mammogram images confirm the seed in the expected location and were
marked for the surgeon.

Follow-up survey of the patient confirms presence of the radioactive
seed.

Order number of [PI] seed:  [PHONE_NUMBER].

Total activity:  0.250 millicurie reference Date: [DATE]

Using mammographic guidance, sterile technique, 1% lidocaine and an
[PI] radioactive seed, the coil shaped clip at the site of the
biopsied DCIS was localized using a medial approach. The follow-up
mammogram images confirm the seed in the expected location and were
marked for the surgeon.

Follow-up survey of the patient confirms presence of the radioactive
seed.

Order number of [PI] seed:  [PHONE_NUMBER].

Total activity:  0.242 millicuries reference Date: [DATE]

The patient tolerated the procedure well and was released from the
[REDACTED]. She was given instructions regarding seed removal.
IMPRESSION: Radioactive seed localizations left breast. No apparent
complications.

## 2021-01-11 IMAGING — US US BREAST*L* LIMITED INC AXILLA
1 series · 1 of 1 positions shown · non-contrast
Comparison: Previous exam(s).

CLINICAL DATA: The patient presented for localization of a left
axillary lymph node. Unfortunately, the lymph node was marked with a
standard heart shaped biopsy clip. A Q shaped clip could not be used
due to her nickel allergy. No HydroMARK clips were available to time
of biopsy.

EXAM:
ULTRASOUND OF THE LEFT AXILLA

[Series 1: us breast*left* limited inc axilla · 0.07mm/px · 1 of 1 slices shown]
[im 1/1]
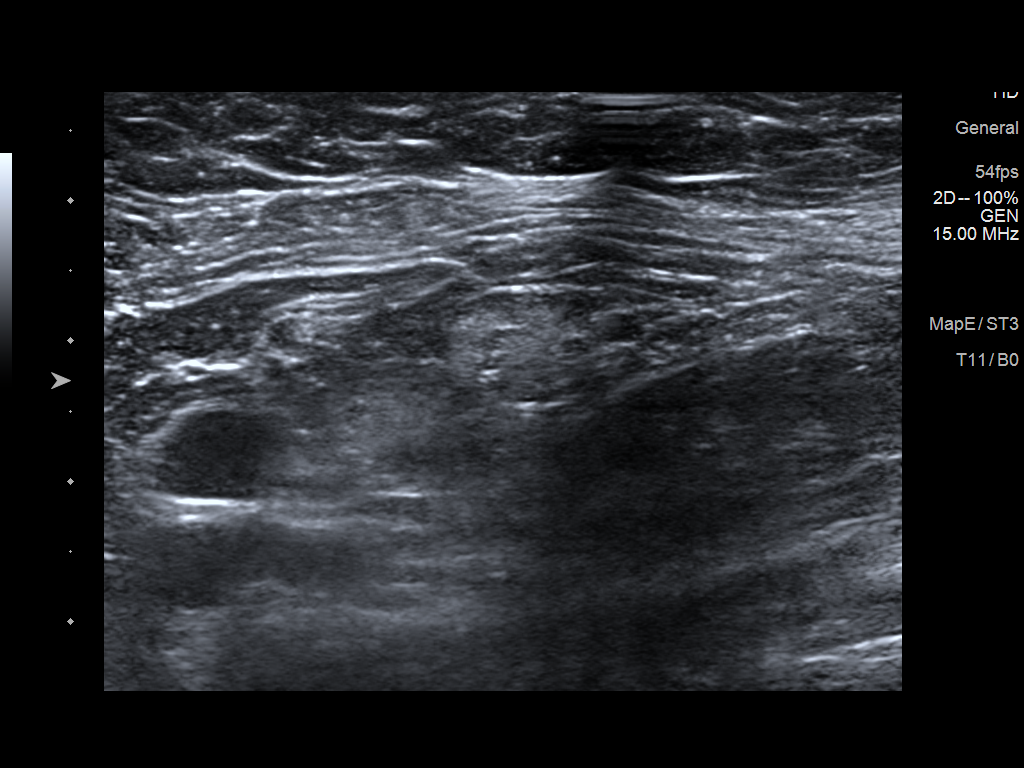

[1 of 1 positions shown; findings below may reference images not displayed]

FINDINGS: On physical exam, no suspicious lumps are identified.

Targeted ultrasound is performed, showing normal nodes in the left
axilla. The previously biopsied lymph node could not be identified.
No biopsy clip was seen.
IMPRESSION: The previously biopsied lymph node which was determined to be
reactive by pathology could not be found on today's ultrasound.
These findings were called to the referring surgeon.

RECOMMENDATION:
Recommend continued surgical follow-up.

I have discussed the findings and recommendations with the patient.
If applicable, a reminder letter will be sent to the patient
regarding the next appointment.

BI-RADS CATEGORY  2: Benign.

## 2021-01-11 NOTE — Progress Notes (Signed)

## 2021-01-12 ENCOUNTER — Ambulatory Visit (HOSPITAL_COMMUNITY)
Admission: RE | Admit: 2021-01-12 | Discharge: 2021-01-12 | Disposition: A | Discharge status: Home / Self Care | Payer: BC Managed Care – PPO | Source: Ambulatory Visit | Attending: Surgery | Admitting: Surgery

## 2021-01-12 ENCOUNTER — Ambulatory Visit (HOSPITAL_BASED_OUTPATIENT_CLINIC_OR_DEPARTMENT_OTHER)
Admission: RE | Admit: 2021-01-12 | Discharge: 2021-01-12 | Disposition: A | Discharge status: Home / Self Care | Payer: BC Managed Care – PPO | Attending: Surgery | Admitting: Surgery

## 2021-01-12 ENCOUNTER — Other Ambulatory Visit: Payer: Self-pay

## 2021-01-12 ENCOUNTER — Ambulatory Visit (HOSPITAL_BASED_OUTPATIENT_CLINIC_OR_DEPARTMENT_OTHER): Payer: BC Managed Care – PPO | Admitting: Anesthesiology

## 2021-01-12 ENCOUNTER — Ambulatory Visit
Admission: RE | Admit: 2021-01-12 | Discharge: 2021-01-12 | Disposition: A | Discharge status: Home / Self Care | Payer: BC Managed Care – PPO | Source: Ambulatory Visit | Attending: Surgery | Admitting: Surgery

## 2021-01-12 ENCOUNTER — Encounter (HOSPITAL_BASED_OUTPATIENT_CLINIC_OR_DEPARTMENT_OTHER): Payer: Self-pay | Admitting: Surgery

## 2021-01-12 ENCOUNTER — Encounter (HOSPITAL_BASED_OUTPATIENT_CLINIC_OR_DEPARTMENT_OTHER)
Admission: RE | Disposition: A | Discharge status: Home / Self Care | Payer: Self-pay | Source: Home / Self Care | Attending: Surgery

## 2021-01-12 DIAGNOSIS — Z79899 Other long term (current) drug therapy: Secondary | ICD-10-CM | POA: Insufficient documentation

## 2021-01-12 DIAGNOSIS — C50912 Malignant neoplasm of unspecified site of left female breast: Secondary | ICD-10-CM

## 2021-01-12 DIAGNOSIS — D0512 Intraductal carcinoma in situ of left breast: Secondary | ICD-10-CM | POA: Diagnosis not present

## 2021-01-12 DIAGNOSIS — Z20822 Contact with and (suspected) exposure to covid-19: Secondary | ICD-10-CM | POA: Insufficient documentation

## 2021-01-12 DIAGNOSIS — Z880 Allergy status to penicillin: Secondary | ICD-10-CM | POA: Insufficient documentation

## 2021-01-12 DIAGNOSIS — Z803 Family history of malignant neoplasm of breast: Secondary | ICD-10-CM | POA: Insufficient documentation

## 2021-01-12 DIAGNOSIS — Z9221 Personal history of antineoplastic chemotherapy: Secondary | ICD-10-CM | POA: Insufficient documentation

## 2021-01-12 DIAGNOSIS — Z91018 Allergy to other foods: Secondary | ICD-10-CM | POA: Insufficient documentation

## 2021-01-12 DIAGNOSIS — N6032 Fibrosclerosis of left breast: Secondary | ICD-10-CM | POA: Insufficient documentation

## 2021-01-12 DIAGNOSIS — Z888 Allergy status to other drugs, medicaments and biological substances status: Secondary | ICD-10-CM | POA: Insufficient documentation

## 2021-01-12 DIAGNOSIS — Z171 Estrogen receptor negative status [ER-]: Secondary | ICD-10-CM | POA: Insufficient documentation

## 2021-01-12 HISTORY — PX: SENTINEL NODE BIOPSY: SHX6608

## 2021-01-12 HISTORY — PX: BREAST LUMPECTOMY: SHX2

## 2021-01-12 HISTORY — PX: BREAST LUMPECTOMY WITH RADIOACTIVE SEED LOCALIZATION: SHX6424

## 2021-01-12 HISTORY — DX: Hypothyroidism, unspecified: E03.9

## 2021-01-12 IMAGING — MG MM BREAST SURGICAL SPECIMEN
1 series · 2 of 2 positions shown · non-contrast
Comparison: Previous exam(s).

CLINICAL DATA: Evaluate specimen

EXAM:
SPECIMEN RADIOGRAPH OF THE LEFT BREAST

[Series 1: L · left · 0.07mm/px · 2 of 2 slices shown]
[im 1/2]
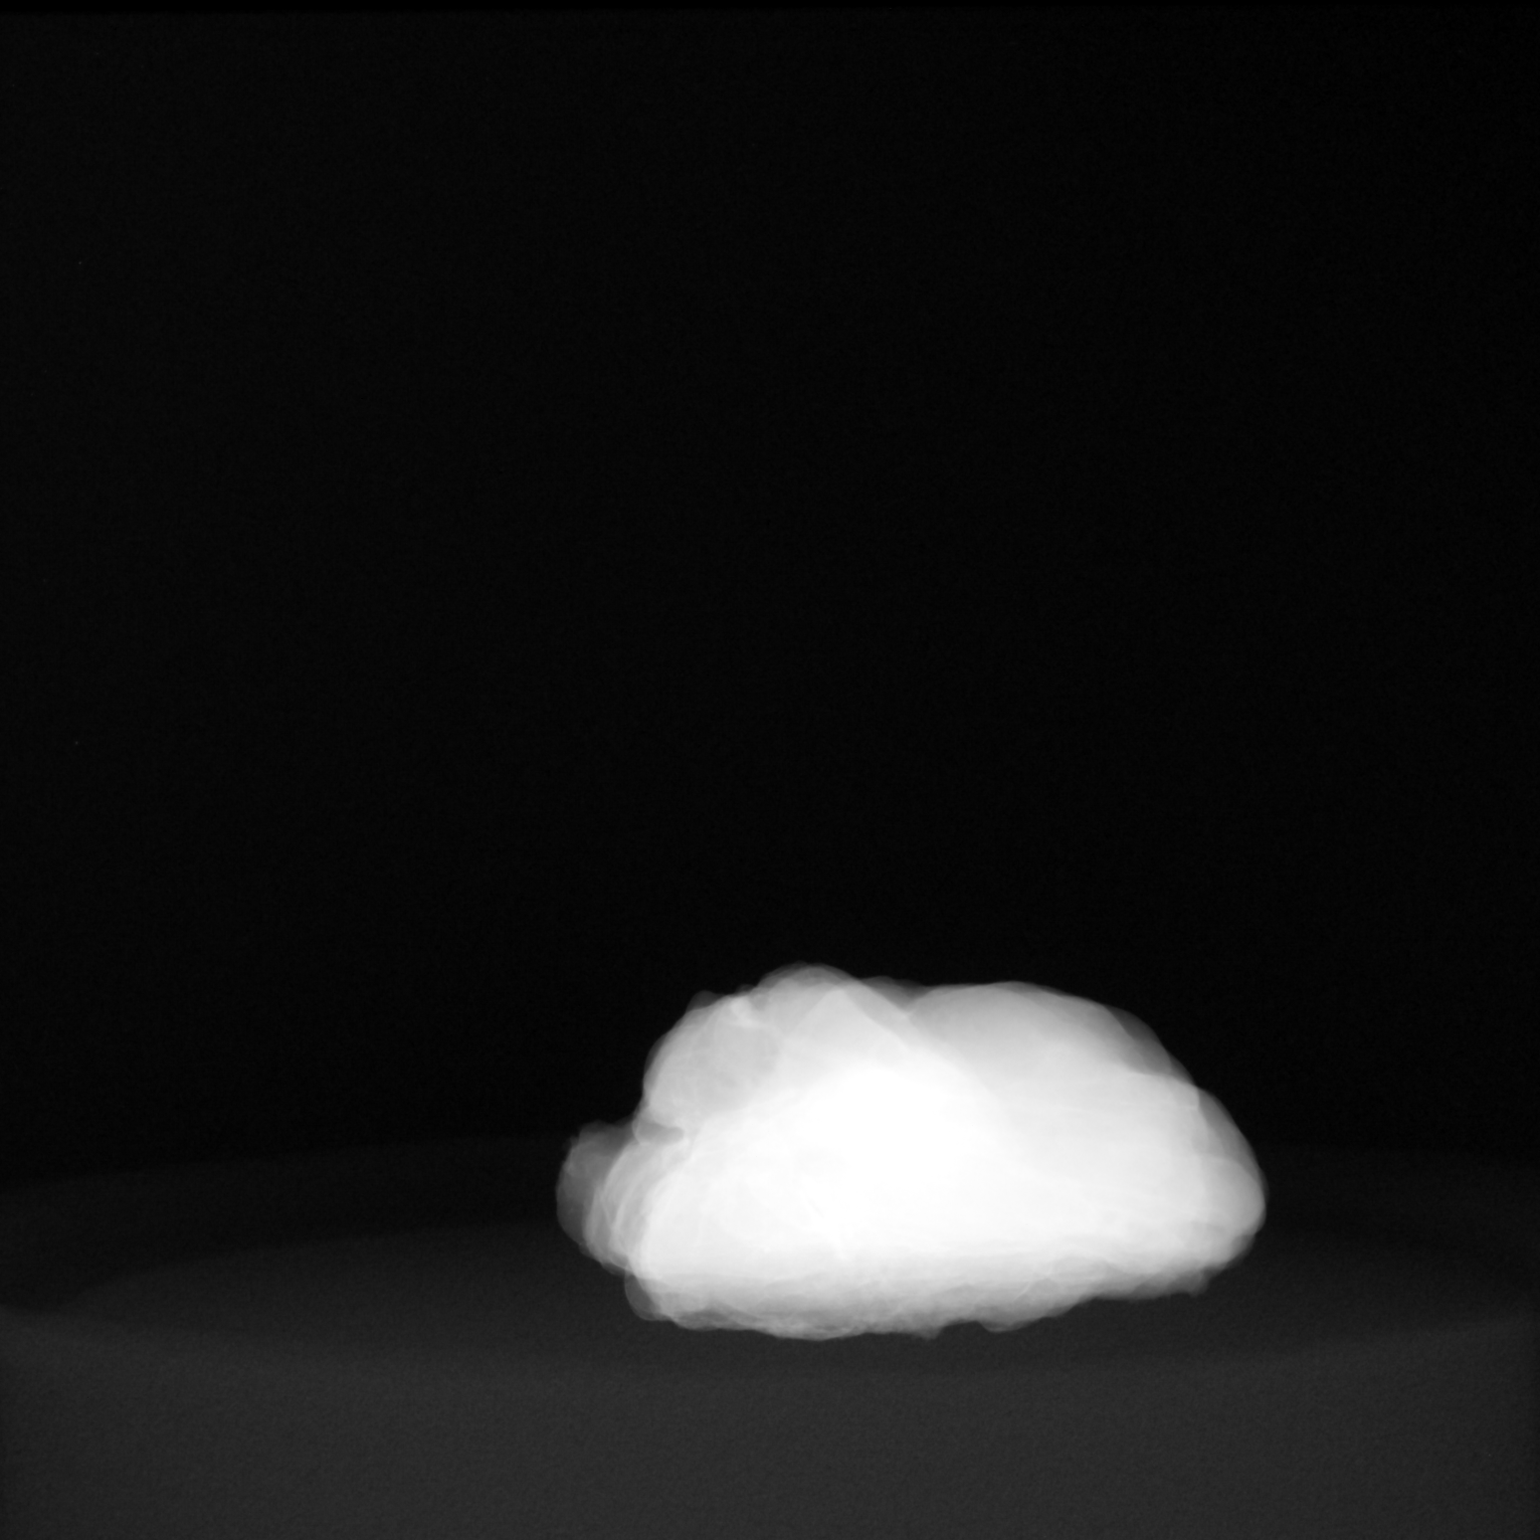
[im 2/2]
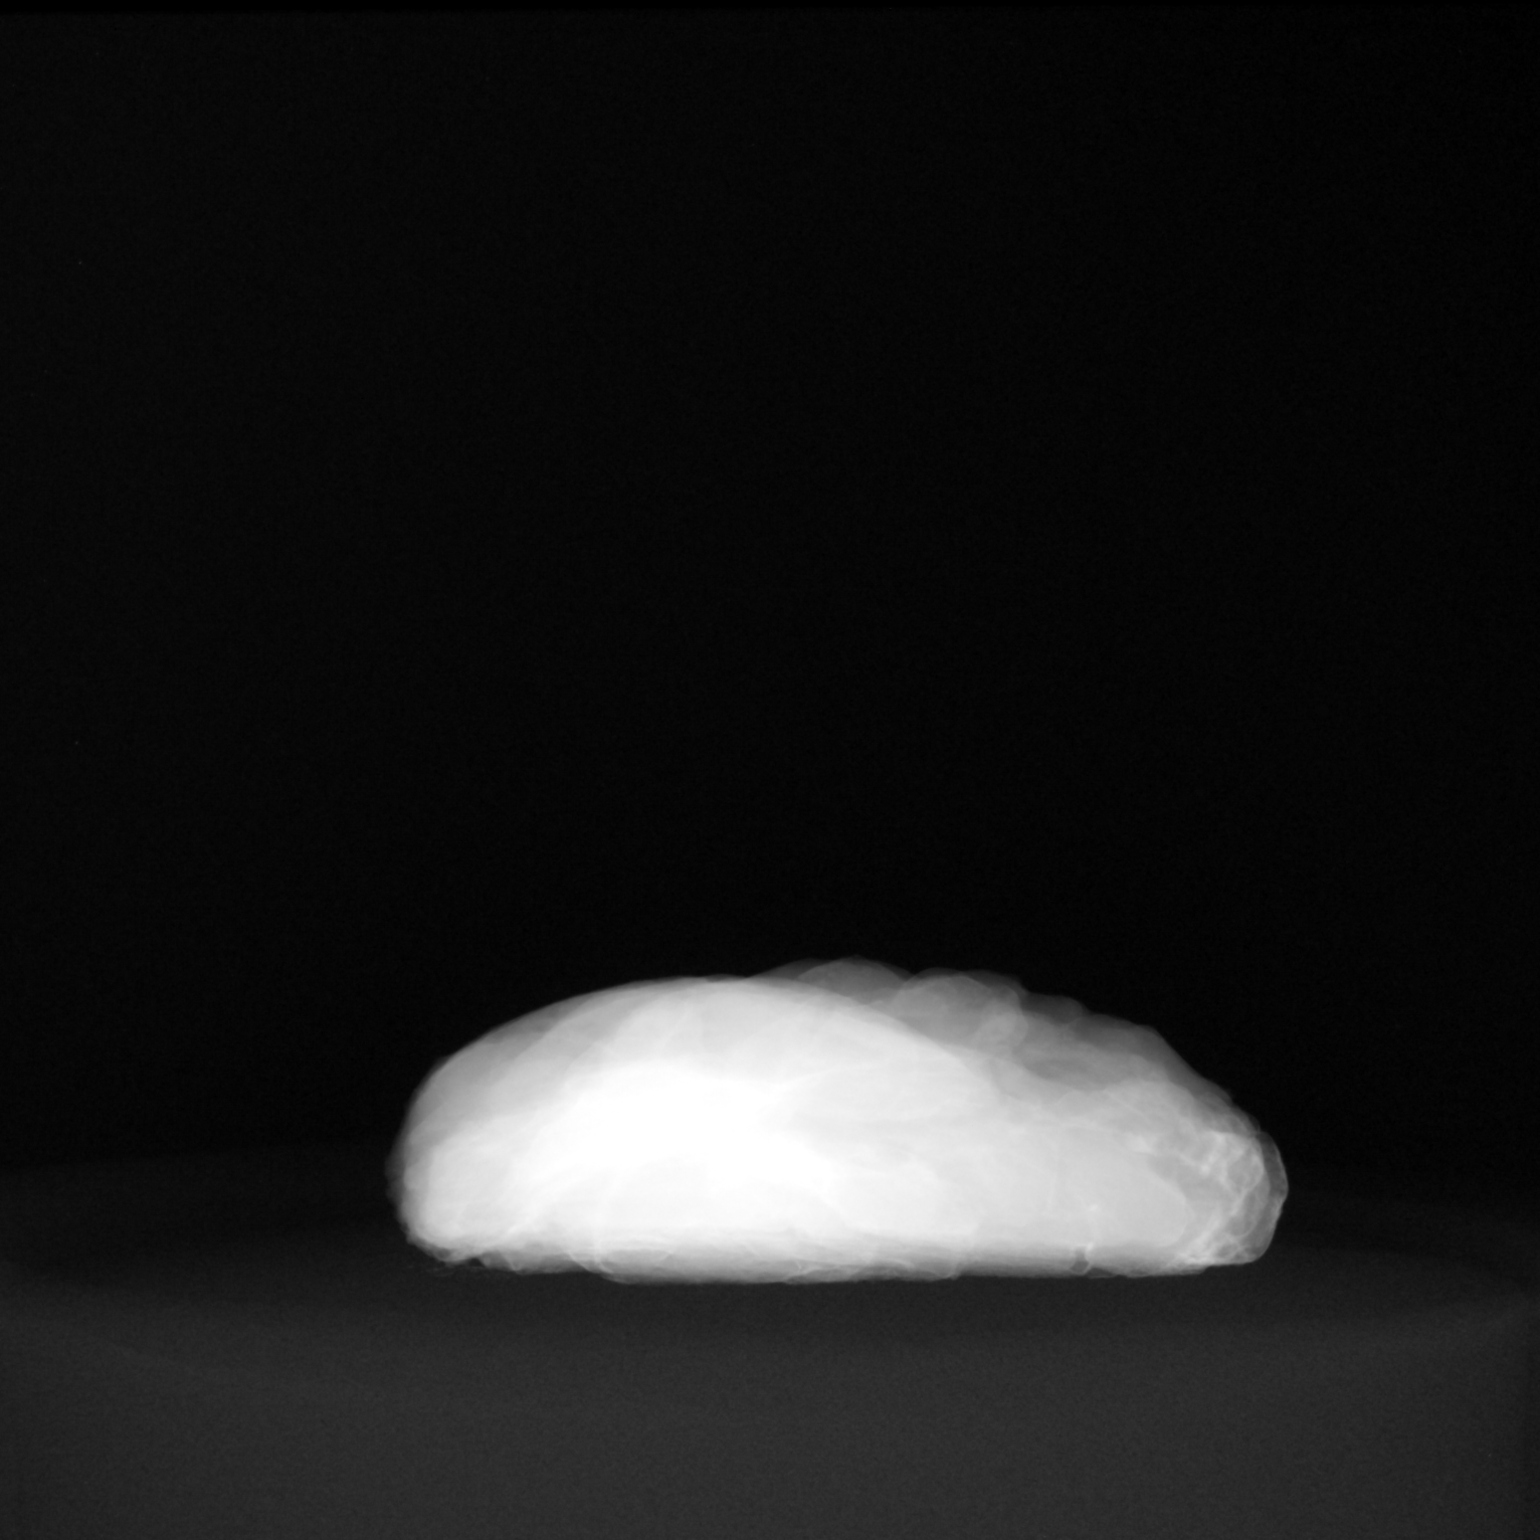

[2 of 2 positions shown; findings below may reference images not displayed]

FINDINGS: Status post excision of the left breast. The radioactive seed and
biopsy marker clip are present, completely intact, and were marked
for pathology.
IMPRESSION: Specimen radiograph of the left breast.

## 2021-01-12 IMAGING — MG MM BREAST SURGICAL SPECIMEN
1 series · 2 of 2 positions shown · non-contrast
Comparison: Previous exam(s).

CLINICAL DATA: Status post excisional biopsy of the left breast.

EXAM:
SPECIMEN RADIOGRAPH OF THE LEFT BREAST

[Series 1: L · left · 0.07mm/px · 2 of 2 slices shown]
[im 1/2]
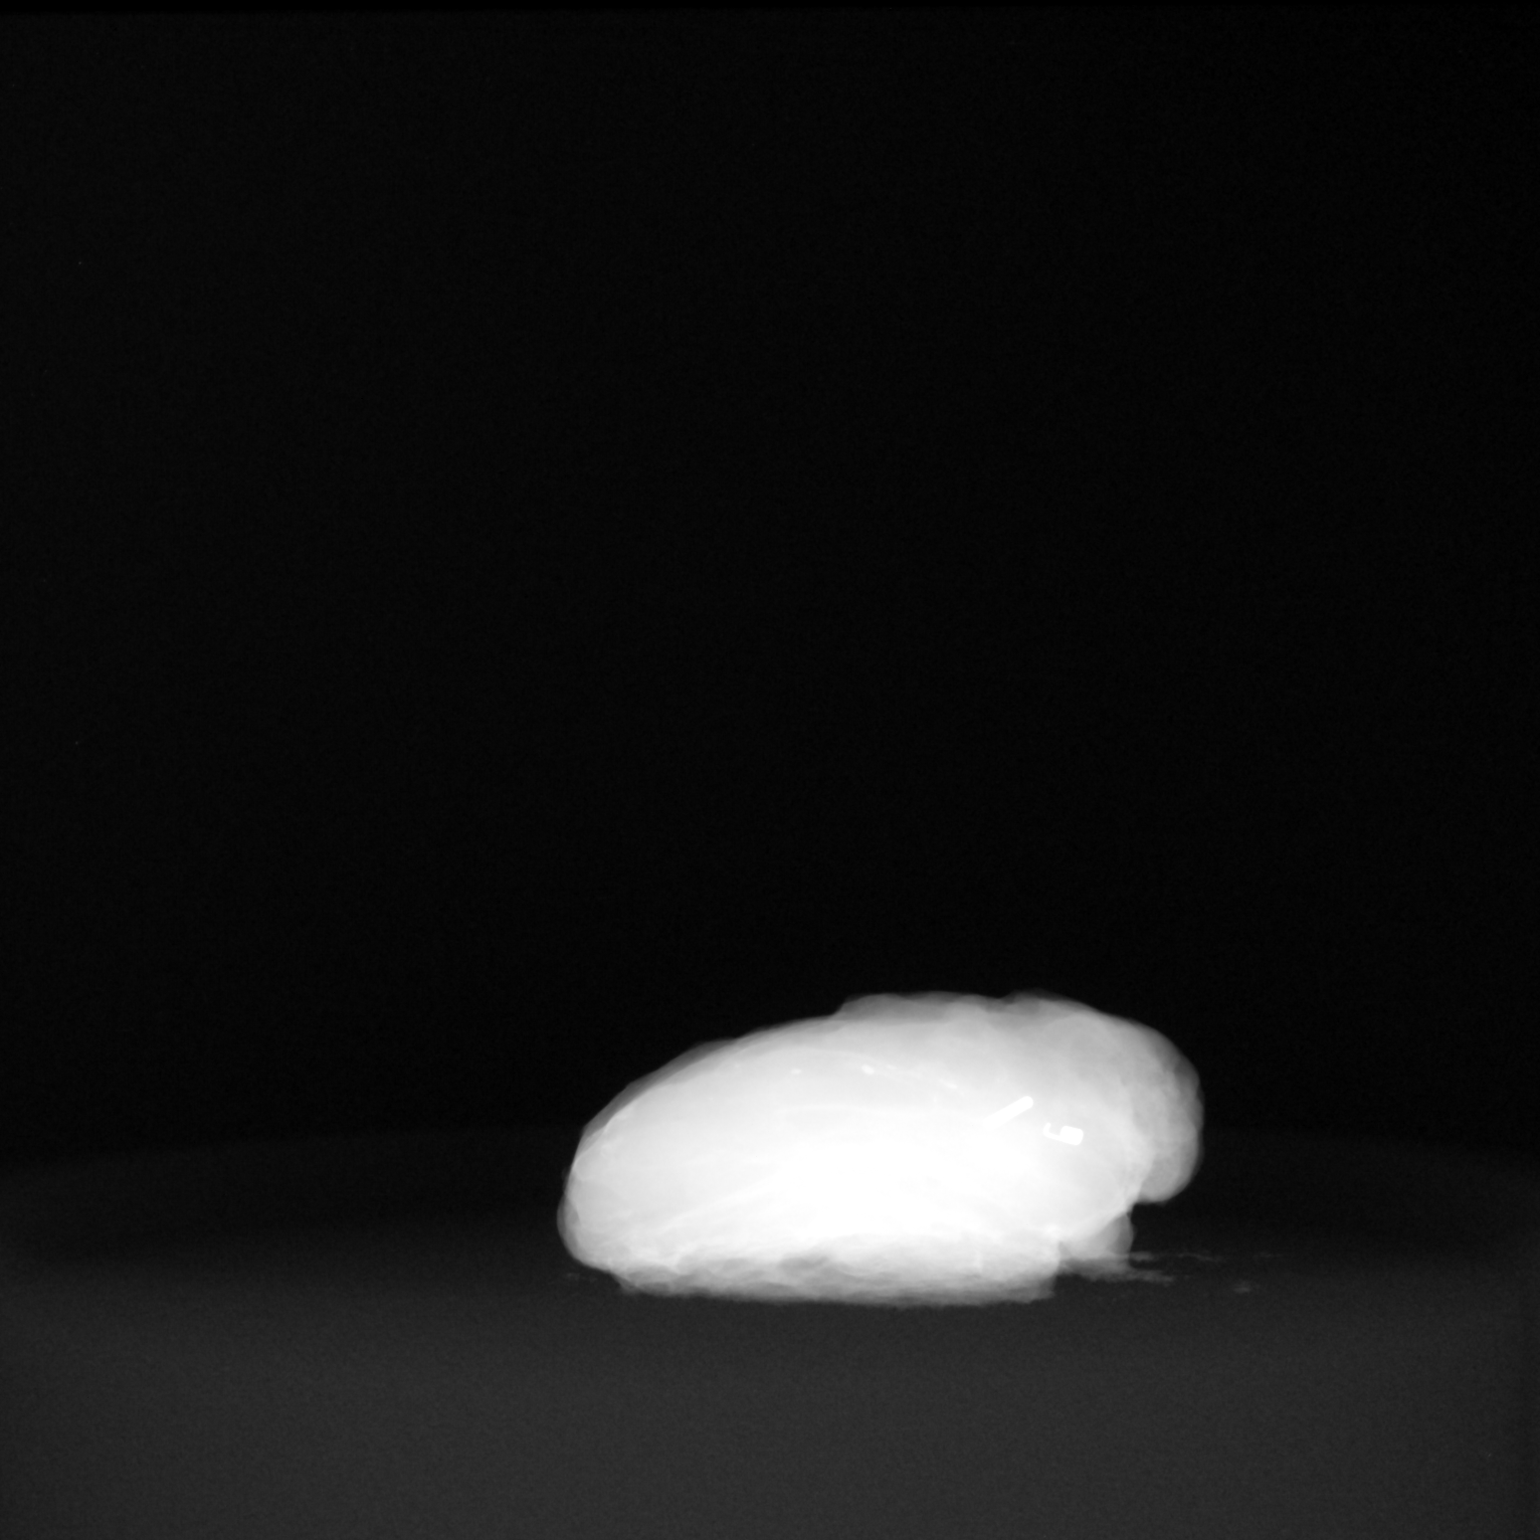
[im 2/2]
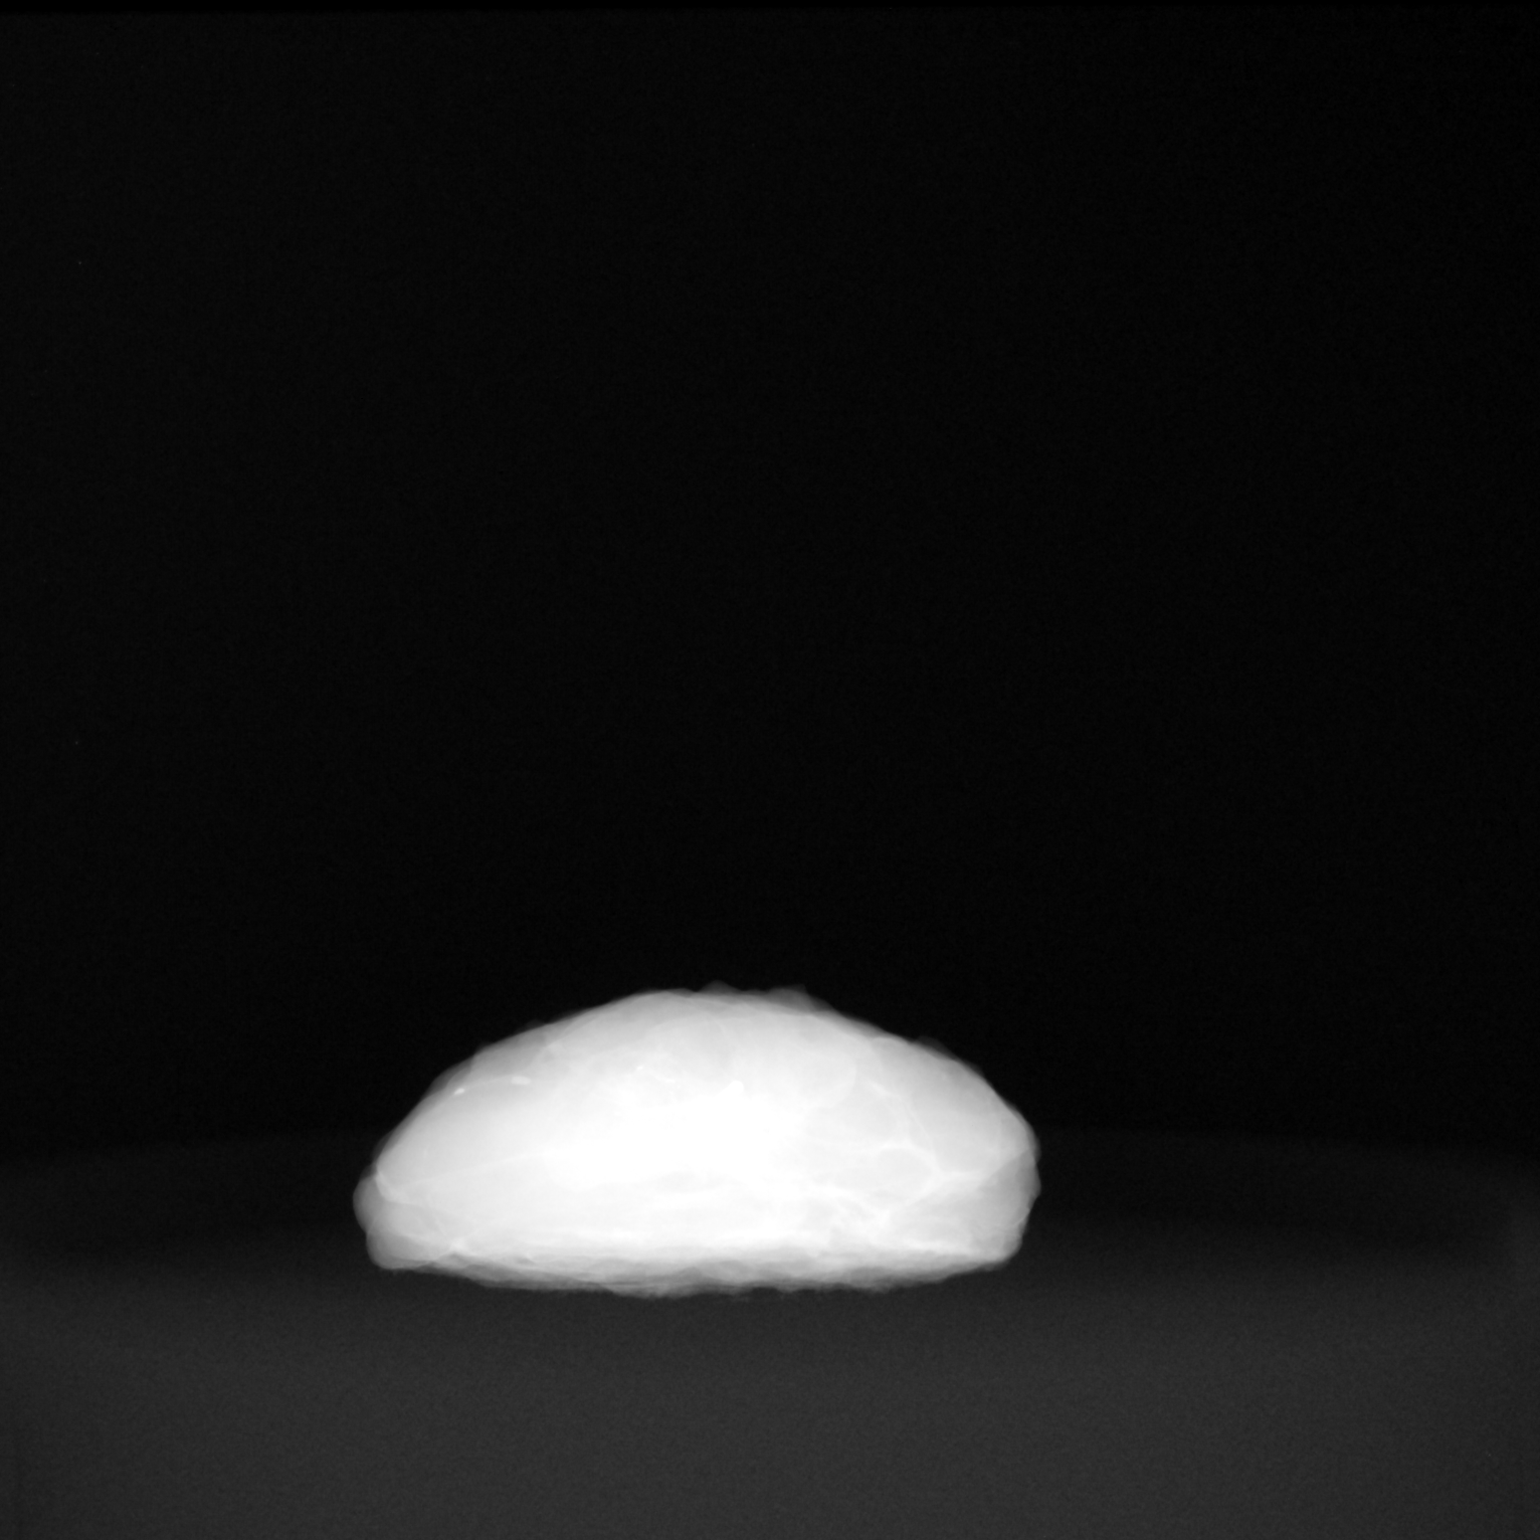

[2 of 2 positions shown; findings below may reference images not displayed]

FINDINGS: Status post excision of the left breast. The radioactive seed and
coil shaped biopsy marker clip are present, completely intact, and
were marked for pathology.
IMPRESSION: Specimen radiograph of the left breast.

## 2021-01-12 SURGERY — BREAST LUMPECTOMY WITH RADIOACTIVE SEED LOCALIZATION
Anesthesia: Regional | Site: Breast

## 2021-01-12 MED ORDER — OXYCODONE HCL 5 MG PO TABS
ORAL_TABLET | ORAL | Status: AC
  Filled 2021-01-12: qty 1

## 2021-01-12 MED ORDER — FENTANYL CITRATE (PF) 100 MCG/2ML IJ SOLN: 25.0000 ug | INTRAMUSCULAR | Status: DC | PRN

## 2021-01-12 MED ORDER — KETOROLAC TROMETHAMINE 15 MG/ML IJ SOLN: 15.0000 mg | Freq: Once | INTRAMUSCULAR | Status: DC | PRN

## 2021-01-12 MED ORDER — ROPIVACAINE HCL 7.5 MG/ML IJ SOLN
INTRAMUSCULAR | Status: DC | PRN
  Administered 2021-01-12: 20 mL via PERINEURAL

## 2021-01-12 MED ORDER — EPHEDRINE SULFATE 50 MG/ML IJ SOLN
INTRAMUSCULAR | Status: DC | PRN
  Administered 2021-01-12 (×2): 10 mg via INTRAVENOUS

## 2021-01-12 MED ORDER — METHYLENE BLUE 0.5 % INJ SOLN
INTRAVENOUS | Status: AC
  Filled 2021-01-12: qty 10

## 2021-01-12 MED ORDER — MIDAZOLAM HCL 2 MG/2ML IJ SOLN
INTRAMUSCULAR | Status: AC
  Filled 2021-01-12: qty 2

## 2021-01-12 MED ORDER — ONDANSETRON HCL 4 MG/2ML IJ SOLN: 4.0000 mg | Freq: Once | INTRAMUSCULAR | Status: DC | PRN

## 2021-01-12 MED ORDER — PROPOFOL 10 MG/ML IV BOLUS
INTRAVENOUS | Status: DC | PRN
  Administered 2021-01-12: 150 mg via INTRAVENOUS
  Administered 2021-01-12: 40 mg via INTRAVENOUS

## 2021-01-12 MED ORDER — CHLORHEXIDINE GLUCONATE CLOTH 2 % EX PADS: 6.0000 | MEDICATED_PAD | Freq: Once | CUTANEOUS | Status: DC

## 2021-01-12 MED ORDER — ONDANSETRON HCL 4 MG/2ML IJ SOLN
INTRAMUSCULAR | Status: AC
  Filled 2021-01-12: qty 2

## 2021-01-12 MED ORDER — AMISULPRIDE (ANTIEMETIC) 5 MG/2ML IV SOLN: 10.0000 mg | Freq: Once | INTRAVENOUS | Status: DC | PRN

## 2021-01-12 MED ORDER — FENTANYL CITRATE (PF) 100 MCG/2ML IJ SOLN
INTRAMUSCULAR | Status: AC
  Filled 2021-01-12: qty 2

## 2021-01-12 MED ORDER — PROPOFOL 10 MG/ML IV BOLUS
INTRAVENOUS | Status: AC
  Filled 2021-01-12: qty 20

## 2021-01-12 MED ORDER — CEFAZOLIN SODIUM-DEXTROSE 2-4 GM/100ML-% IV SOLN
INTRAVENOUS | Status: AC
  Filled 2021-01-12: qty 100

## 2021-01-12 MED ORDER — EPHEDRINE 5 MG/ML INJ
INTRAVENOUS | Status: AC
  Filled 2021-01-12: qty 10

## 2021-01-12 MED ORDER — LACTATED RINGERS IV SOLN: INTRAVENOUS | Status: DC

## 2021-01-12 MED ORDER — ONDANSETRON HCL 4 MG/2ML IJ SOLN
INTRAMUSCULAR | Status: DC | PRN
  Administered 2021-01-12: 4 mg via INTRAVENOUS

## 2021-01-12 MED ORDER — FENTANYL CITRATE (PF) 100 MCG/2ML IJ SOLN
INTRAMUSCULAR | Status: DC | PRN
  Administered 2021-01-12: 50 ug via INTRAVENOUS

## 2021-01-12 MED ORDER — ACETAMINOPHEN 500 MG PO TABS
ORAL_TABLET | ORAL | Status: AC
  Filled 2021-01-12: qty 2

## 2021-01-12 MED ORDER — DEXAMETHASONE SODIUM PHOSPHATE 10 MG/ML IJ SOLN
INTRAMUSCULAR | Status: DC | PRN
  Administered 2021-01-12: 5 mg via INTRAVENOUS

## 2021-01-12 MED ORDER — MIDAZOLAM HCL 2 MG/2ML IJ SOLN
1.0000 mg | Freq: Once | INTRAMUSCULAR | Status: AC
  Administered 2021-01-12: 2 mg via INTRAVENOUS

## 2021-01-12 MED ORDER — CEFAZOLIN SODIUM-DEXTROSE 2-4 GM/100ML-% IV SOLN
2.0000 g | INTRAVENOUS | Status: AC
  Administered 2021-01-12: 2 g via INTRAVENOUS

## 2021-01-12 MED ORDER — OXYCODONE HCL 5 MG PO TABS
5.0000 mg | ORAL_TABLET | Freq: Once | ORAL | Status: AC
Start: 2021-01-12 — End: 2021-01-12
  Administered 2021-01-12: 5 mg via ORAL

## 2021-01-12 MED ORDER — LIDOCAINE 2% (20 MG/ML) 5 ML SYRINGE
INTRAMUSCULAR | Status: AC
  Filled 2021-01-12: qty 5

## 2021-01-12 MED ORDER — BUPIVACAINE-EPINEPHRINE 0.25% -1:200000 IJ SOLN
INTRAMUSCULAR | Status: DC | PRN
  Administered 2021-01-12: 20 mL

## 2021-01-12 MED ORDER — ACETAMINOPHEN 500 MG PO TABS
1000.0000 mg | ORAL_TABLET | ORAL | Status: AC
  Administered 2021-01-12: 1000 mg via ORAL

## 2021-01-12 MED ORDER — TECHNETIUM TC 99M TILMANOCEPT KIT
1.0000 | PACK | Freq: Once | INTRAVENOUS | Status: AC | PRN
  Administered 2021-01-12: 1 via INTRADERMAL

## 2021-01-12 MED ORDER — STERILE WATER FOR IRRIGATION IR SOLN
Status: DC | PRN
  Administered 2021-01-12: 3 mL

## 2021-01-12 MED ORDER — LIDOCAINE HCL (CARDIAC) PF 100 MG/5ML IV SOSY
PREFILLED_SYRINGE | INTRAVENOUS | Status: DC | PRN
  Administered 2021-01-12: 60 mg via INTRATRACHEAL

## 2021-01-12 MED ORDER — SODIUM CHLORIDE (PF) 0.9 % IJ SOLN
INTRAMUSCULAR | Status: AC
  Filled 2021-01-12: qty 10

## 2021-01-12 SURGICAL SUPPLY — 49 items
APL PRP STRL LF DISP 70% ISPRP (MISCELLANEOUS) ×2
APL SKNCLS STERI-STRIP NONHPOA (GAUZE/BANDAGES/DRESSINGS) ×2
APPLIER CLIP 9.375 MED OPEN (MISCELLANEOUS) ×3
APR CLP MED 9.3 20 MLT OPN (MISCELLANEOUS) ×2
BENZOIN TINCTURE PRP APPL 2/3 (GAUZE/BANDAGES/DRESSINGS) ×3 IMPLANT
BLADE HEX COATED 2.75 (ELECTRODE) ×3 IMPLANT
BLADE SURG 15 STRL LF DISP TIS (BLADE) ×2 IMPLANT
BLADE SURG 15 STRL SS (BLADE) ×3
CANISTER SUC SOCK COL 7IN (MISCELLANEOUS) IMPLANT
CANISTER SUCT 1200ML W/VALVE (MISCELLANEOUS) IMPLANT
CHLORAPREP W/TINT 26 (MISCELLANEOUS) ×3 IMPLANT
CLIP APPLIE 9.375 MED OPEN (MISCELLANEOUS) ×2 IMPLANT
COVER BACK TABLE 60X90IN (DRAPES) ×3 IMPLANT
COVER MAYO STAND STRL (DRAPES) ×3 IMPLANT
COVER PROBE W GEL 5X96 (DRAPES) ×3 IMPLANT
COVER WAND RF STERILE (DRAPES) IMPLANT
DECANTER SPIKE VIAL GLASS SM (MISCELLANEOUS) IMPLANT
DRAPE LAPAROTOMY 100X72 PEDS (DRAPES) ×3 IMPLANT
DRAPE UTILITY XL STRL (DRAPES) ×3 IMPLANT
DRSG TEGADERM 4X4.75 (GAUZE/BANDAGES/DRESSINGS) ×3 IMPLANT
ELECT REM PT RETURN 9FT ADLT (ELECTROSURGICAL) ×3
ELECTRODE REM PT RTRN 9FT ADLT (ELECTROSURGICAL) ×2 IMPLANT
GAUZE SPONGE 4X4 12PLY STRL LF (GAUZE/BANDAGES/DRESSINGS) IMPLANT
GLOVE SURG ENC MOIS LTX SZ7 (GLOVE) ×3 IMPLANT
GLOVE SURG UNDER POLY LF SZ7.5 (GLOVE) ×3 IMPLANT
GOWN STRL REUS W/ TWL LRG LVL3 (GOWN DISPOSABLE) ×4 IMPLANT
GOWN STRL REUS W/TWL LRG LVL3 (GOWN DISPOSABLE) ×6
ILLUMINATOR WAVEGUIDE N/F (MISCELLANEOUS) IMPLANT
KIT MARKER MARGIN INK (KITS) ×3 IMPLANT
LIGHT WAVEGUIDE WIDE FLAT (MISCELLANEOUS) IMPLANT
NEEDLE HYPO 25X1 1.5 SAFETY (NEEDLE) ×3 IMPLANT
NS IRRIG 1000ML POUR BTL (IV SOLUTION) ×3 IMPLANT
PACK BASIN DAY SURGERY FS (CUSTOM PROCEDURE TRAY) ×3 IMPLANT
PENCIL SMOKE EVACUATOR (MISCELLANEOUS) ×3 IMPLANT
SLEEVE SCD COMPRESS KNEE MED (STOCKING) ×3 IMPLANT
SPONGE GAUZE 2X2 8PLY STRL LF (GAUZE/BANDAGES/DRESSINGS) IMPLANT
SPONGE LAP 18X18 RF (DISPOSABLE) IMPLANT
SPONGE LAP 4X18 RFD (DISPOSABLE) ×3 IMPLANT
STRIP CLOSURE SKIN 1/2X4 (GAUZE/BANDAGES/DRESSINGS) ×3 IMPLANT
SUT MON AB 4-0 PC3 18 (SUTURE) ×3 IMPLANT
SUT SILK 2 0 SH (SUTURE) IMPLANT
SUT VIC AB 3-0 SH 27 (SUTURE) ×3
SUT VIC AB 3-0 SH 27X BRD (SUTURE) ×2 IMPLANT
SYR BULB EAR ULCER 3OZ GRN STR (SYRINGE) IMPLANT
SYR CONTROL 10ML LL (SYRINGE) ×3 IMPLANT
TOWEL GREEN STERILE FF (TOWEL DISPOSABLE) ×3 IMPLANT
TRAY FAXITRON CT DISP (TRAY / TRAY PROCEDURE) ×6 IMPLANT
TUBE CONNECTING 20X1/4 (TUBING) IMPLANT
YANKAUER SUCT BULB TIP NO VENT (SUCTIONS) IMPLANT

## 2021-01-12 NOTE — Anesthesia Preprocedure Evaluation (Addendum)
Anesthesia Evaluation  Patient identified by MRN, date of birth, ID band Patient awake    Reviewed: Allergy & Precautions, NPO status , Patient's Chart, lab work & pertinent test results  Airway Mallampati: II  TM Distance: >3 FB Neck ROM: Full    Dental no notable dental hx.    Pulmonary neg pulmonary ROS,    Pulmonary exam normal breath sounds clear to auscultation       Cardiovascular hypertension, Pt. on medications Normal cardiovascular exam Rhythm:Regular Rate:Normal  ECG: SB, rate 52  ECHO: 1. Left ventricular ejection fraction, by estimation, is 60 to 65%. The left ventricle has normal function. The left ventricle has no regional wall motion abnormalities. There is mild left ventricular hypertrophy. Left ventricular diastolic parameters are consistent with Grade I diastolic dysfunction (impaired relaxation). 2. Right ventricular systolic function is normal. The right ventricular size is normal. There is normal pulmonary artery systolic pressure. 3. The mitral valve is normal in structure. No evidence of mitral valve regurgitation. No evidence of mitral stenosis. 4. The aortic valve has an indeterminant number of cusps. Aortic valve regurgitation is not visualized. No aortic stenosis is present. 5. The inferior vena cava is normal in size with greater than 50% respiratory variability, suggesting right atrial pressure of 3 mmHg.   Neuro/Psych negative neurological ROS  negative psych ROS   GI/Hepatic Neg liver ROS, GERD  Medicated and Controlled,  Endo/Other  Hypothyroidism   Renal/GU negative Renal ROS     Musculoskeletal negative musculoskeletal ROS (+)   Abdominal   Peds  Hematology negative hematology ROS (+)   Anesthesia Other Findings LEFT BREAST CANCER  Reproductive/Obstetrics                            Anesthesia Physical Anesthesia Plan  ASA: II  Anesthesia Plan:  General and Regional   Post-op Pain Management: GA combined w/ Regional for post-op pain   Induction: Intravenous  PONV Risk Score and Plan: 3 and Ondansetron, Dexamethasone, Midazolam and Treatment may vary due to age or medical condition  Airway Management Planned: LMA  Additional Equipment:   Intra-op Plan:   Post-operative Plan: Extubation in OR  Informed Consent: I have reviewed the patients History and Physical, chart, labs and discussed the procedure including the risks, benefits and alternatives for the proposed anesthesia with the patient or authorized representative who has indicated his/her understanding and acceptance.     Dental advisory given  Plan Discussed with: CRNA  Anesthesia Plan Comments:        Anesthesia Quick Evaluation

## 2021-01-12 NOTE — Anesthesia Postprocedure Evaluation (Signed)
Anesthesia Post Note  Patient: Jo Mills  Procedure(s) Performed: LEFT BREAST LUMPECTOMY X 2 WITH RADIOACTIVE SEED LOCALIZATION (Left Breast) SENTINEL NODE BIOPSY (N/A Breast)     Patient location during evaluation: PACU Anesthesia Type: Regional and General Level of consciousness: awake Pain management: pain level controlled Vital Signs Assessment: post-procedure vital signs reviewed and stable Respiratory status: spontaneous breathing, nonlabored ventilation, respiratory function stable and patient connected to nasal cannula oxygen Cardiovascular status: blood pressure returned to baseline and stable Postop Assessment: no apparent nausea or vomiting Anesthetic complications: no   No complications documented.  Last Vitals:  Vitals:   01/12/21 1030 01/12/21 1054  BP: 122/78 129/68  Pulse: 84 85  Resp: 12 15  Temp:  36.5 C  SpO2: 96% 98%    Last Pain:  Vitals:   01/12/21 1112  TempSrc:   PainSc: 2                  Jeree Delcid P Gustavia Carie

## 2021-01-12 NOTE — Anesthesia Procedure Notes (Signed)
Anesthesia Regional Block: Pectoralis block   Pre-Anesthetic Checklist: ,, timeout performed, Correct Patient, Correct Site, Correct Laterality, Correct Procedure, Correct Position, site marked, Risks and benefits discussed,  Surgical consent,  Pre-op evaluation,  At surgeon's request and post-op pain management  Laterality: Left  Prep: chloraprep       Needles:  Injection technique: Single-shot  Needle Type: Echogenic Stimulator Needle     Needle Length: 10cm  Needle Gauge: 20     Additional Needles:   Procedures:,,,, ultrasound used (permanent image in chart),,,,  Narrative:  Start time: 01/12/2021 8:05 AM End time: 01/12/2021 8:15 AM Injection made incrementally with aspirations every 5 mL.  Performed by: Personally  Anesthesiologist: Murvin Natal, MD  Additional Notes: Functioning IV was confirmed and monitors were applied.  A timeout was performed. Sterile prep, hand hygiene and sterile gloves were used. A 15mm 20ga BBraun echogenic stimulator needle was used. Negative aspiration and negative test dose prior to incremental administration of local anesthetic. The patient tolerated the procedure well.  Ultrasound guidance: relevent anatomy identified, needle position confirmed, local anesthetic spread visualized around nerve(s), vascular puncture avoided.  Image printed for medical record.

## 2021-01-12 NOTE — Progress Notes (Signed)
Assisted nuc med tech with nuc med injections. Side rails up, monitors on throughout procedure. See vital signs in flow sheet. Tolerated Procedure well. 

## 2021-01-12 NOTE — Transfer of Care (Signed)
Immediate Anesthesia Transfer of Care Note  Patient: Jo Mills  Procedure(s) Performed: LEFT BREAST LUMPECTOMY X 2 WITH RADIOACTIVE SEED LOCALIZATION (Left Breast) SENTINEL NODE BIOPSY (N/A Breast)  Patient Location: PACU  Anesthesia Type:General  Level of Consciousness: drowsy, patient cooperative and responds to stimulation  Airway & Oxygen Therapy: Patient Spontanous Breathing and Patient connected to nasal cannula oxygen  Post-op Assessment: Report given to RN and Post -op Vital signs reviewed and stable  Post vital signs: Reviewed and stable  Last Vitals:  Vitals Value Taken Time  BP    Temp    Pulse    Resp    SpO2      Last Pain:  Vitals:   01/12/21 0730  TempSrc: Oral  PainSc: 0-No pain         Complications: No complications documented.

## 2021-01-12 NOTE — Interval H&P Note (Signed)
History and Physical Interval Note:  01/12/2021 7:29 AM  Jo Mills  has presented today for surgery, with the diagnosis of LEFT BREAST CANCER.  The various methods of treatment have been discussed with the patient and family. After consideration of risks, benefits and other options for treatment, the patient has consented to  Procedure(s): LEFT BREAST LUMPECTOMY X 2 WITH RADIOACTIVE SEED LOCALIZATION (Left) RADIOACTIVE SEED GUIDED LEFT AXILLARY SENTINEL LYMPH NODE DISSECTION (Left) SENTINEL NODE BIOPSY (N/A) as a surgical intervention.  The patient's history has been reviewed, patient examined, no change in status, stable for surgery.  I have reviewed the patient's chart and labs.  Questions were answered to the patient's satisfaction.     [name]

## 2021-01-12 NOTE — Progress Notes (Signed)
Assisted Dr. Ellender with left, ultrasound guided, pectoralis block. Side rails up, monitors on throughout procedure. See vital signs in flow sheet. Tolerated Procedure well. 

## 2021-01-12 NOTE — Discharge Instructions (Signed)
[place] Office Phone Number [phone]  BREAST BIOPSY/ PARTIAL MASTECTOMY: POST OP INSTRUCTIONS  Always review your discharge instruction sheet given to you by the facility where your surgery was performed.  IF YOU HAVE DISABILITY OR FAMILY LEAVE FORMS, YOU MUST BRING THEM TO THE OFFICE FOR PROCESSING.  DO NOT GIVE THEM TO YOUR DOCTOR.  1. A prescription for pain medication may be given to you upon discharge.  Take your pain medication as prescribed, if needed.  If narcotic pain medicine is not needed, then you may take acetaminophen (Tylenol) or ibuprofen (Advil) as needed. 2. Take your usually prescribed medications unless otherwise directed 3. If you need a refill on your pain medication, please contact your pharmacy.  They will contact our office to request authorization.  Prescriptions will not be filled after 5pm or on week-ends. 4. You should eat very light the first 24 hours after surgery, such as soup, crackers, pudding, etc.  Resume your normal diet the day after surgery. 5. Most patients will experience some swelling and bruising in the breast.  Ice packs and a good support bra will help.  Swelling and bruising can take several days to resolve.  6. It is common to experience some constipation if taking pain medication after surgery.  Increasing fluid intake and taking a stool softener will usually help or prevent this problem from occurring.  A mild laxative (Milk of Magnesia or Miralax) should be taken according to package directions if there are no bowel movements after 48 hours. 7. Unless discharge instructions indicate otherwise, you may remove your bandages 24-48 hours after surgery, and you may shower at that time.  You may have steri-strips (small skin tapes) in place directly over the incision.  These strips should be left on the skin for 7-10 days.  If your surgeon used skin glue on the incision, you may shower in 24 hours.  The glue will flake off over the  next 2-3 weeks.  Any sutures or staples will be removed at the office during your follow-up visit. 8. ACTIVITIES:  You may resume regular daily activities (gradually increasing) beginning the next day.  Wearing a good support bra or sports bra minimizes pain and swelling.  You may have sexual intercourse when it is comfortable. a. You may drive when you no longer are taking prescription pain medication, you can comfortably wear a seatbelt, and you can safely maneuver your car and apply brakes. b. RETURN TO WORK:  ______________________________________________________________________________________ 9. You should see your doctor in the office for a follow-up appointment approximately two weeks after your surgery.  Your doctor's nurse will typically make your follow-up appointment when she calls you with your pathology report.  Expect your pathology report 2-3 business days after your surgery.  You may call to check if you do not hear from [place] after three days. 10. OTHER INSTRUCTIONS: _______________________________________________________________________________________________ _____________________________________________________________________________________________________________________________________ _____________________________________________________________________________________________________________________________________ _____________________________________________________________________________________________________________________________________  WHEN TO CALL YOUR DOCTOR: 1. Fever over 101.0 2. Nausea and/or vomiting. 3. Extreme swelling or bruising. 4. Continued bleeding from incision. 5. Increased pain, redness, or drainage from the incision.  The clinic staff is available to answer your questions during regular business hours.  Please don't hesitate to call and ask to speak to one of the nurses for clinical concerns.  If you have a medical emergency, go to the nearest  emergency room or call 911.  A surgeon from [facility] is always on call at the hospital.  For further questions, please visit [email or web address]  No Tylenol until 1:31 pm   Post Anesthesia Home Care Instructions  Activity: Get plenty of rest for the remainder of the day. A responsible individual must stay with you for 24 hours following the procedure.  For the next 24 hours, DO NOT: -Drive a car -Paediatric nurse -Drink alcoholic beverages -Take any medication unless instructed by your physician -Make any legal decisions or sign important papers.  Meals: Start with liquid foods such as gelatin or soup. Progress to regular foods as tolerated. Avoid greasy, spicy, heavy foods. If nausea and/or vomiting occur, drink only clear liquids until the nausea and/or vomiting subsides. Call your physician if vomiting continues.  Special Instructions/Symptoms: Your throat may feel dry or sore from the anesthesia or the breathing tube placed in your throat during surgery. If this causes discomfort, gargle with warm salt water. The discomfort should disappear within 24 hours.  If you had a scopolamine patch placed behind your ear for the management of post- operative nausea and/or vomiting:  1. The medication in the patch is effective for 72 hours, after which it should be removed.  Wrap patch in a tissue and discard in the trash. Wash hands thoroughly with soap and water. 2. You may remove the patch earlier than 72 hours if you experience unpleasant side effects which may include dry mouth, dizziness or visual disturbances. 3. Avoid touching the patch. Wash your hands with soap and water after contact with the patch.

## 2021-01-12 NOTE — Op Note (Addendum)
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 Assistant:  Carlena Hurl, PA-C Anesthesia:  GEN - LMA/ PEC block Indications:  This is a 70 year old female in good health who presents with a palpable mass in her left breast. The mass has been present for at least 5 months. She has not had a mammogram since 2013. Mammogram and ultrasound showed 2 areas in the left upper inner quadrant of concern. The palpable mass is 2.7 x 2.6 x 2.5 cm located at 10:30 10 centimeters from the nipple. 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  A thickened lymph node was biopsied and was benign.  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  The patient underwent neoadjuvant chemotherapy in [place].  She had a follow-up MRI performed yesterday that showed excellent response.  There is no abnormal enhancement remaining in the upper inner left quadrant.  There is a 0.5 x 2.7 cm area of non-mass enhancement at the site of the previous malignancy.  Lymph nodes appeared normal.  Patient seemed to tolerate her chemotherapy well.  She presents now for lumpectomy x 2 and SLNB.  Description of procedure: The patient is brought to the operating room placed in supine position on the operating room table. After an adequate level of general anesthesia was obtained, we injected methylene blue dye solution into the dermis around her nipple.  Her left breast was prepped with ChloraPrep and draped in sterile fashion. A timeout was taken to ensure the proper patient and proper procedure. We interrogated the breast with the neoprobe. We made a circumareolar incision around the  upper side of the nipple after infiltrating with 0.25% Marcaine. Dissection was carried down in the breast tissue with cautery. We used the neoprobe to guide [place] towards the more anterior radioactive seed. We excised an area of tissue around the radioactive seed 2 cm in diameter. The specimen was removed and was oriented with a paint kit. Specimen mammogram showed the radioactive seed as well as the biopsy clip within the specimen. This was sent for pathologic examination.   We turned our attention to the deeper, more superior seed, marking the area of previous IDC.  Through the same incision, we used the neoprobe to guide [place] towards the more posterior seed.  We excised an area of tissue around the seed 3 cm in diameter.  The specimen was removed and oriented with the paint kit.  Specimen mammogram showed the radioactive seed as well as the biopsy clip within the specimen.  This was also sent for pathologic examination.  There is no residual radioactivity within the biopsy cavity.  We inspected carefully for hemostasis. The wound was thoroughly irrigated.   We then turned our attention to the axilla.  The settings were adjusted on the Neoprobe and we interrogated the axilla.  I made a transverse incision over the area of activity.  We dissected into the axilla and identified a radioactive lymph node.   This was dissected free and sent as "sentinel lymph node #1".  We interrogated the axilla again and a second node was visually identified.  This was sent as "additional lymph node."  There was minimal background activity.  The wounds  were closed with a deep layer of 3-0 Vicryl and a subcuticular layer of 4-0 Monocryl. Benzoin Steri-Strips were applied. The patient was then extubated and brought to the recovery room in stable condition. All sponge, instrument, and needle counts are correct.  Jo Mills. Jo Dover, MD, [facility]  General/ Trauma Surgery  09/06/2020 1:38 PM

## 2021-01-12 NOTE — Anesthesia Procedure Notes (Signed)
Procedure Name: LMA Insertion Date/Time: 01/12/2021 8:52 AM Performed by: Glory Buff, CRNA Pre-anesthesia Checklist: Patient identified, Emergency Drugs available, Suction available and Patient being monitored Patient Re-evaluated:Patient Re-evaluated prior to induction Oxygen Delivery Method: Circle system utilized Preoxygenation: Pre-oxygenation with 100% oxygen Induction Type: IV induction LMA: LMA inserted LMA Size: 4.0 Number of attempts: 1 Placement Confirmation: positive ETCO2 Tube secured with: Tape Dental Injury: Teeth and Oropharynx as per pre-operative assessment

## 2021-01-13 ENCOUNTER — Encounter (HOSPITAL_BASED_OUTPATIENT_CLINIC_OR_DEPARTMENT_OTHER): Payer: Self-pay | Admitting: Surgery

## 2021-01-14 LAB — SURGICAL PATHOLOGY

## 2021-01-17 ENCOUNTER — Other Ambulatory Visit (HOSPITAL_COMMUNITY): Payer: Self-pay | Admitting: *Deleted

## 2021-01-17 MED ORDER — LEVOTHYROXINE SODIUM 100 MCG PO TABS: 100.0000 ug | ORAL_TABLET | Freq: Every day | ORAL | 2 refills | Status: DC

## 2021-01-31 ENCOUNTER — Inpatient Hospital Stay (HOSPITAL_COMMUNITY): Payer: BC Managed Care – PPO | Attending: Hematology | Admitting: Hematology

## 2021-01-31 ENCOUNTER — Other Ambulatory Visit: Payer: Self-pay

## 2021-01-31 ENCOUNTER — Telehealth: Payer: Self-pay

## 2021-01-31 VITALS — BP 126/78 | HR 74 | Temp 97.0°F | Resp 18 | Wt 161.0 lb

## 2021-01-31 DIAGNOSIS — Z171 Estrogen receptor negative status [ER-]: Secondary | ICD-10-CM | POA: Insufficient documentation

## 2021-01-31 DIAGNOSIS — Z79899 Other long term (current) drug therapy: Secondary | ICD-10-CM | POA: Insufficient documentation

## 2021-01-31 DIAGNOSIS — R5383 Other fatigue: Secondary | ICD-10-CM | POA: Diagnosis not present

## 2021-01-31 DIAGNOSIS — R202 Paresthesia of skin: Secondary | ICD-10-CM | POA: Diagnosis not present

## 2021-01-31 DIAGNOSIS — C50812 Malignant neoplasm of overlapping sites of left female breast: Secondary | ICD-10-CM | POA: Diagnosis present

## 2021-01-31 DIAGNOSIS — G629 Polyneuropathy, unspecified: Secondary | ICD-10-CM | POA: Diagnosis not present

## 2021-01-31 DIAGNOSIS — N6314 Unspecified lump in the right breast, lower inner quadrant: Secondary | ICD-10-CM | POA: Diagnosis not present

## 2021-01-31 DIAGNOSIS — Z880 Allergy status to penicillin: Secondary | ICD-10-CM | POA: Insufficient documentation

## 2021-01-31 DIAGNOSIS — R200 Anesthesia of skin: Secondary | ICD-10-CM | POA: Insufficient documentation

## 2021-01-31 DIAGNOSIS — N6311 Unspecified lump in the right breast, upper outer quadrant: Secondary | ICD-10-CM | POA: Insufficient documentation

## 2021-01-31 NOTE — Telephone Encounter (Signed)
Left patient a voicemail message to call back in regards to mychart visit with Jo Simpson PA on 02/09/21 @ 1:30am. Called to review meaningful use questions. TM

## 2021-01-31 NOTE — Progress Notes (Signed)
[place] [address], [place] [address]   Patient Care Team: Roderic Scarce, MD as PCP - General (Nurse Practitioner) Rockwell Germany, RN as Oncology Nurse Navigator Mauro Kaufmann, RN as Oncology Nurse Navigator Brien Mates, RN as Oncology Nurse Navigator (Oncology)  SUMMARY OF ONCOLOGIC HISTORY: Oncology History  Malignant neoplasm of overlapping sites of left breast in female, estrogen receptor negative ([place])  05/17/2020 Cancer Staging   Staging form: Breast, AJCC 8th Edition - Clinical stage from 05/17/2020: Stage IIB (cT2, cN0, cM0, G3, ER-, PR-, HER2-) - Signed by Gardenia Phlegm, NP on 05/26/2020   05/26/2020 Initial Diagnosis   Patient palpated a left breast mass for 5 months. Mammogram and US showed a 2.7cm mass at the 10:30 position, one left axillary lymph node with cortical thickening, calcifications in the lower inner left breast, and a 0.4cm group of calcifications in the right breast. Biopsy on 05/14/20 showed no malignancy in the right breast, and in the left breast, DCIS, high grade, ER/PR negative. 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    06/16/2020 -  Chemotherapy    Patient is on Treatment Plan: BREAST AC Q21D / CARBOPLATIN D1 + PACLITAXEL D1,8,15 Q21D      09/22/2020 Genetic Testing   Negative genetic testing. No pathogenic variants identified on the [facility] Common Hereditary Cancers Panel. The report date is 09/22/2020.  The Common Hereditary Cancers Panel offered by [facility] includes sequencing and/or deletion duplication testing of the following 48 genes: APC, ATM, AXIN2, BARD1, BMPR1A, BRCA1, BRCA2, BRIP1, CDH1, CDKN2A (p14ARF), CDKN2A (p16INK4a), CKD4, CHEK2, CTNNA1, DICER1, EPCAM (Deletion/duplication testing only), GREM1 (promoter region deletion/duplication testing only), KIT, MEN1,  MLH1, MSH2, MSH3, MSH6, MUTYH, NBN, NF1, NHTL1, PALB2, PDGFRA, PMS2, POLD1, POLE, PTEN, RAD50, RAD51C, RAD51D, RNF43, SDHB, SDHC, SDHD, SMAD4, SMARCA4. STK11, TP53, TSC1, TSC2, and VHL.  The following genes were evaluated for sequence changes only: SDHA and HOXB13 c.251G>A variant only.     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 is a 70 y.o. female here today for follow up of her triple negative left breast cancer. Her last visit was on 12/08/2020. She had a left breast lumpectomy with SLNB on 03/30 with Dr. Donnie Mesa.  Today she is accompanied by husband and she reports feeling okay. She tolerated the surgery well, though she reports having a lot of fluid expressed from the lumpectomy scar on 04/13. She applies a patch on it daily and it drains minimal serosanguinous fluid. Her skin rash has completely resolved and her nails are near baseline. The numbness and tingling in her feet is improving and resolved in her hands.  She agrees to do radiation in [place] and will do a virtual visit on 04/27.   REVIEW OF SYSTEMS:   Review of Systems  Constitutional: Positive for fatigue (75%). Negative for appetite change.  Cardiovascular:       Fluid eruption out of lumpectomy site  Skin: Negative for rash.  Neurological: Positive for numbness (& tingling in feet).  All other systems reviewed and are negative.   I have reviewed the past medical history, past surgical history, social history and family history with the patient and they are unchanged from previous note.   ALLERGIES:   is allergic to grapefruit extract, pseudoephedrine hcl, nickel,  pineapple, and amoxicillin.   MEDICATIONS:  Current Outpatient Medications  Medication Sig Dispense Refill  . amLODipine-benazepril (LOTREL) 5-20 MG capsule Take 1 capsule by mouth daily.    . Bismuth Subsalicylate (PEPTO-BISMOL PO) Take by mouth as needed.    Marland Kitchen dexamethasone (DECADRON)  4 MG tablet Take 1 tablet daily for 3 days after chemo with food 30 tablet 1  . fexofenadine (ALLEGRA) 180 MG tablet Take 180 mg by mouth daily.    . fluconazole (DIFLUCAN) 100 MG tablet Take 1 tablet (100 mg total) by mouth daily. 7 tablet 0  . fluticasone (FLONASE) 50 MCG/ACT nasal spray Place into both nostrils daily.    Marland Kitchen ketoconazole (NIZORAL) 2 % cream Apply 1 application topically daily. 30 g 1  . Lactobacillus-Inulin (CULTURELLE DIGESTIVE DAILY) CAPS Take 1 capsule by mouth daily.    Marland Kitchen levothyroxine (SYNTHROID) 100 MCG tablet Take 1 tablet (100 mcg total) by mouth daily before breakfast. 30 tablet 2  . lidocaine-prilocaine (EMLA) cream SMARTSIG:1 Topical Every Night    . LORazepam (ATIVAN) 0.5 MG tablet Take 1 tablet (0.5 mg total) by mouth at bedtime as needed for sleep. 30 tablet 0  . magic mouthwash SOLN Take 5 mLs by mouth 3 (three) times daily as needed for mouth pain. 450 mL 0  . omeprazole (PRILOSEC) 20 MG capsule Take 20 mg by mouth daily.    . ondansetron (ZOFRAN) 8 MG tablet Take 1 tablet (8 mg total) by mouth 2 (two) times daily as needed. Start on the third day after chemotherapy. 30 tablet 1  . prochlorperazine (COMPAZINE) 10 MG tablet Take 1 tablet (10 mg total) by mouth every 6 (six) hours as needed (Nausea or vomiting). 30 tablet 1   No current facility-administered medications for this visit.   Facility-Administered Medications Ordered in Other Visits  Medication Dose Route Frequency Provider Last Rate Last Admin  . sodium chloride flush (NS) 0.9 % injection 10 mL  10 mL Intracatheter PRN Ladell Pier, MD         PHYSICAL EXAMINATION: Performance status (ECOG): 1 - Symptomatic but completely ambulatory  Vitals:   01/31/21 1549  BP: 126/78  Pulse: 74  Resp: 18  Temp: (!) 97 F (36.1 C)  SpO2: 98%   Wt Readings from Last 3 Encounters:  01/31/21 161 lb (73 kg)  01/12/21 155 lb 6.8 oz (70.5 kg)  12/08/20 158 lb 9.6 oz (71.9 kg)   Physical Exam Vitals  reviewed.  Constitutional:      Appearance: Normal appearance.  Chest:  Breasts:     Left: Skin change (incision healing) present. No swelling, bleeding, inverted nipple, mass, nipple discharge or tenderness.    Skin:    Findings: No rash.  Neurological:     General: No focal deficit present.     Mental Status: She is alert and oriented to person, place, and time.  Psychiatric:        Mood and Affect: Mood normal.        Behavior: Behavior normal.     Breast Exam Chaperone: Milinda Antis, MD     LABORATORY DATA:  I have reviewed the data as listed CMP Latest Ref Rng & Units 12/08/2020 12/07/2020 11/30/2020  Glucose 70 - 99 mg/dL 66(L) 90 93  BUN 8 - 23 mg/dL _0 Creatinine 0.44 - 1.00 mg/dL 0.58 0.57 0.64  Sodium 135 - 145 mmol/L 138 134(L) 135  Potassium 3.5 - 5.1 mmol/L 3.7 4.2 3.7  Chloride 98 -  111 mmol/L 106 103 109  CO2 22 - 32 mmol/L _0 Calcium 8.9 - 10.3 mg/dL 9.0 8.7(L) 8.8(L)  Total Protein 6.5 - 8.1 g/dL 6.1(L) 6.3(L) 6.1(L)  Total Bilirubin 0.3 - 1.2 mg/dL 0.6 0.5 0.4  Alkaline Phos 38 - 126 U/L 78 69 77  AST 15 - 41 U/L _1 ALT 0 - 44 U/L _2 No results found for: IOE703 Lab Results  Component Value Date   WBC 6.4 12/08/2020   HGB 10.8 (L) 12/08/2020   HCT 32.8 (L) 12/08/2020   MCV 109.0 (H) 12/08/2020   PLT 186 12/08/2020   NEUTROABS 4.5 12/08/2020    ASSESSMENT:  1. Stage II (T2N0) left breast TNBC: -Felt left breast mass for 5 months. -Mammogram on 05/07/2020 with suspicious mass in the 10:30 o'clock position in the left breast. Suspicious calcifications in the anterior lower inner quadrant of the left breast. -MRI on 06/05/2020 shows left breast mass measuring 3.8 x 3.3 x 3.2 cm in the upper inner quadrant. 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 No suspicious lymphadenopathy. No MRI evidence of malignancy on the right. -Left breast upper inner quadrant biopsy on 05/14/2020 showed DCIS  with calcifications. Right breast biopsy shows fibroadenomatoid nodule with calcifications. ER/PR was negative. -Left breast core needle biopsy at 10:30 position shows invasive ductal carcinoma. ER/PR/HER-2 was negative. Ki-67 85%. Left lymph node biopsy was reactive. -Patient was evaluated by Dr. [name] and Dr. Georgette Dover. -She received first cycle of chemotherapy with Adriamycin and cyclophosphamide with pembrolizumab ([id number] trial)added for improved PCR by about 15%. -Cycle 1 of Adriamycin, cyclophosphamide and pembrolizumab on9/10/2019. - Left lumpectomy and SLNB on 01/12/2021, YPT0, ypN0.  2. Family history: -Paternal cousin and second cousin had breast cancer. Paternal grandmother also had some type of cancer. -Another paternal cousin died of pancreatic cancer. Another paternal cousin had leukemia.   PLAN:  1. Triple negative left breast cancer: -She had left lumpectomy and SLNB on 01/12/2021. - We reviewed the pathology report in detail.  She had a pathological complete response with pathology showing YPT0, ypN0 indicating very good prognosis. - I have recommended 9 more cycles of Keytruda as given in the clinical trial every 3 weeks in the adjuvant setting. - We discussed side effects of Keytruda. - We will tentatively start in 2 weeks.  She will also proceed with radiation therapy.  2. Family history: -Genetic testing was negative.  3.Skin rash: -She does not have any rash at this time.  It is unclear whether it was from paclitaxel or Keytruda.  We will closely monitor once we restart Keytruda.  4. High risk drug monitoring: -She will continue Synthroid daily.  Will check TSH prior to each cycle.  5. Neuropathy: -Mild numbness in the toes is improving.  No numbness in the fingertips.   Orders Placed This Encounter  Procedures  . CBC with Differential/Platelet    Standing Status:   Future    Standing Expiration Date:   01/31/2022    Order Specific  Question:   Release to patient    Answer:   Immediate  . Comprehensive metabolic panel    Standing Status:   Future    Standing Expiration Date:   01/31/2022    Order Specific Question:   Release to patient    Answer:   Immediate  . TSH    Standing Status:   Future    Standing Expiration Date:   01/31/2022  Order Specific Question:   Release to patient    Answer:   Immediate  . T4, free    Standing Status:   Future    Standing Expiration Date:   01/31/2022   The patient has a good understanding of the overall plan. she agrees with it. she will call with any problems that may develop before the next visit here.    Derek Jack, MD [place] [phone]   I, Milinda Antis, am acting as a scribe for Dr. Sanda Linger.  I, Derek Jack MD, have reviewed the above documentation for accuracy and completeness, and I agree with the above.

## 2021-01-31 NOTE — Patient Instructions (Signed)
[place] at [facility] Discharge Instructions  You were seen today by Dr. Delton Coombes. He went over your recent results. You will need to finish your full course of Keytruda. Keep your appointment to start your radiation at [facility]. Dr. Delton Coombes will see you back in 2 weeks for labs and follow up.   Thank you for choosing [place] at [facility] to provide your oncology and hematology care.  To afford each patient quality time with our provider, please arrive at least 15 minutes before your scheduled appointment time.   If you have a lab appointment with the [facility] please come in thru the Main Entrance and check in at the main information desk  You need to re-schedule your appointment should you arrive 10 or more minutes late.  We strive to give you quality time with our providers, and arriving late affects you and other patients whose appointments are after yours.  Also, if you no show three or more times for appointments you may be dismissed from the clinic at the providers discretion.     Again, thank you for choosing [facility].  Our hope is that these requests will decrease the amount of time that you wait before being seen by our physicians.       _____________________________________________________________  Should you have questions after your visit to [facility], please contact our office at [phone] between the hours of 8:00 a.m. and 4:30 p.m.  Voicemails left after 4:00 p.m. will not be returned until the following business day.  For prescription refill requests, have your pharmacy contact our office and allow 72 hours.    Cancer Center Support Programs:   > Cancer Support Group  2nd Tuesday of the month 1pm-2pm, Journey Room

## 2021-02-02 ENCOUNTER — Ambulatory Visit (HOSPITAL_COMMUNITY): Payer: BC Managed Care – PPO | Admitting: Hematology

## 2021-02-03 ENCOUNTER — Encounter (HOSPITAL_COMMUNITY): Payer: Self-pay

## 2021-02-04 ENCOUNTER — Telehealth: Payer: Self-pay | Admitting: *Deleted

## 2021-02-04 NOTE — Telephone Encounter (Signed)
Left a voicemail for the patient to let her know that we received her voicemail.  Her appointment would last 30-45 minutes and it is a mychart video visit and she does not have to come here for it.  Call back number [phone]) was left in the event she has further questions.  Gloriajean Dell. Leonie Green, BSN

## 2021-02-07 ENCOUNTER — Telehealth: Payer: Self-pay

## 2021-02-07 ENCOUNTER — Encounter: Payer: Self-pay | Admitting: Radiation Oncology

## 2021-02-07 ENCOUNTER — Other Ambulatory Visit: Payer: Self-pay

## 2021-02-07 NOTE — Telephone Encounter (Signed)
Spoke with patient in regards to telephone visit appointment with Shona Simpson PA on 02/09/21. Patient is aware of appointment date and time. Reviewed meaningful use questions. TM

## 2021-02-07 NOTE — Telephone Encounter (Signed)
Left patient a voicemail message to call back in regards to mychart visit on Wednesday 02/09/21 @ 1:30pm. Called to review meaningful use questions. TM

## 2021-02-09 ENCOUNTER — Ambulatory Visit
Admission: RE | Admit: 2021-02-09 | Discharge: 2021-02-09 | Disposition: A | Discharge status: Home / Self Care | Payer: BC Managed Care – PPO | Source: Ambulatory Visit | Attending: Radiation Oncology | Admitting: Radiation Oncology

## 2021-02-09 ENCOUNTER — Ambulatory Visit
Admission: RE | Admit: 2021-02-09 | Payer: BC Managed Care – PPO | Source: Ambulatory Visit | Admitting: Radiation Oncology

## 2021-02-09 DIAGNOSIS — Z171 Estrogen receptor negative status [ER-]: Secondary | ICD-10-CM

## 2021-02-09 DIAGNOSIS — C50812 Malignant neoplasm of overlapping sites of left female breast: Secondary | ICD-10-CM

## 2021-02-09 NOTE — Progress Notes (Signed)
Radiation Oncology         [phone] ________________________________   Name: [name]        MRN: [id number]  Date of Service: 02/09/2021 DOB: [birth year]  [id number], Jo Purser, MD  Derek Jack, MD     REFERRING PHYSICIAN: Derek Jack, MD   DIAGNOSIS: The encounter diagnosis was Malignant neoplasm of overlapping sites of left breast in female, estrogen receptor negative ([place]).   HISTORY OF PRESENT ILLNESS: Jo Mills is a 70 y.o. female with a diagnosis of left breast cancer. The patient was noted to have a palpable mass in the left breast about 5 months ago.  Diagnostic imaging revealed a mass at the 10:30 position in the left breast measuring 2.7 x 2.6 x 2.5 cm.  In the left axilla single lymph node was mildly thickened measuring 4 mm.  A biopsy on 05/14/2020 in the upper inner quadrant revealed high-grade DCIS with calcifications and necrosis, a mid or posterior depth biopsy was also obtained consistent with fibroadenomatous nodule and calcifications.  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   Since her last visit, she had MRI imaging that showed a 3.8 cm enhancing mass in the left inner breast no adenopathy was identified.  She received neoadjuvant chemotherapy between 06/16/2020 and 12/08/2020.  Her post treatment MRI on 12/18/2020 did not reveal any abnormal enhancement or adenopathy.  This was followed by lumpectomy which was performed on 01/12/2021.  Final pathology revealed no residual carcinoma in the specimen of either the anterior or posterior specimen, 4 sampled lymph nodes were negative for disease.  She is seen today via MyChart to review the rationale for adjuvant radiotherapy.    PREVIOUS RADIATION THERAPY: No   PAST MEDICAL  HISTORY:  Past Medical History:  Diagnosis Date  . Allergy   . Breast cancer ([place])   . Cancer ([place])   . Diverticulitis   . Family history of breast cancer 08/17/2020  . Family history of pancreatic cancer 08/17/2020  . GERD (gastroesophageal reflux disease)   . Hypertension   . Hypothyroidism   . Skin cancer        PAST SURGICAL HISTORY: Past Surgical History:  Procedure Laterality Date  . BREAST LUMPECTOMY WITH RADIOACTIVE SEED LOCALIZATION Left 01/12/2021   Procedure: LEFT BREAST LUMPECTOMY X 2 WITH RADIOACTIVE SEED LOCALIZATION;  Surgeon: Donnie Mesa, MD;  Location: [facility];  Service: General;  Laterality: Left;  . COLONOSCOPY    . PORTACATH PLACEMENT N/A 06/15/2020   Procedure: INSERTION PORT-A-CATH WITH ULTRASOUND GUIDANCE;  Surgeon: Donnie Mesa, MD;  Location: [facility];  Service: General;  Laterality: Right;  LMA, LEAVE PORT ACCESSED-CHEMO START 9/1  . SENTINEL NODE BIOPSY N/A 01/12/2021   Procedure: SENTINEL NODE BIOPSY;  Surgeon: Donnie Mesa, MD;  Location: [facility];  Service: General;  Laterality: N/A;     FAMILY HISTORY:  Family History  Problem Relation Age of Onset  . Diverticulitis Mother   . Hypertension Maternal Aunt   . Hypertension Maternal Uncle   . Cancer Paternal Grandmother        unknown type; d. 79s  . Breast cancer Cousin 44       paternal   . Pancreatic cancer Cousin  paternal; dx 25s     SOCIAL HISTORY:  reports that she has never smoked. She has never used smokeless tobacco. She reports previous alcohol use. She reports that she does not use drugs. The patient is married and lives in [place], [place], [place]. She is a high school educator   ALLERGIES: Grapefruit extract, Pseudoephedrine hcl, Nickel, Pineapple, and Amoxicillin   MEDICATIONS:  Current Outpatient Medications  Medication Sig Dispense Refill  . amLODipine-benazepril (LOTREL) 5-20 MG capsule Take 1 capsule by  mouth daily.    . Bismuth Subsalicylate (PEPTO-BISMOL PO) Take by mouth as needed.    . fexofenadine (ALLEGRA) 180 MG tablet Take 180 mg by mouth daily.    . fluticasone (FLONASE) 50 MCG/ACT nasal spray Place into both nostrils daily.    Marland Kitchen ketoconazole (NIZORAL) 2 % cream Apply 1 application topically daily. 30 g 1  . Lactobacillus-Inulin (CULTURELLE DIGESTIVE DAILY) CAPS Take 1 capsule by mouth daily.    Marland Kitchen levothyroxine (SYNTHROID) 100 MCG tablet Take 1 tablet (100 mcg total) by mouth daily before breakfast. 30 tablet 2  . omeprazole (PRILOSEC) 20 MG capsule Take 20 mg by mouth daily.    Marland Kitchen dexamethasone (DECADRON) 4 MG tablet Take 1 tablet daily for 3 days after chemo with food (Patient not taking: Reported on 02/07/2021) 30 tablet 1  . fluconazole (DIFLUCAN) 100 MG tablet Take 1 tablet (100 mg total) by mouth daily. (Patient not taking: Reported on 02/07/2021) 7 tablet 0  . lidocaine-prilocaine (EMLA) cream SMARTSIG:1 Topical Every Night (Patient not taking: Reported on 02/07/2021)    . LORazepam (ATIVAN) 0.5 MG tablet Take 1 tablet (0.5 mg total) by mouth at bedtime as needed for sleep. (Patient not taking: Reported on 02/07/2021) 30 tablet 0  . magic mouthwash SOLN Take 5 mLs by mouth 3 (three) times daily as needed for mouth pain. (Patient not taking: Reported on 02/07/2021) 450 mL 0  . ondansetron (ZOFRAN) 8 MG tablet Take 1 tablet (8 mg total) by mouth 2 (two) times daily as needed. Start on the third day after chemotherapy. (Patient not taking: Reported on 02/07/2021) 30 tablet 1  . prochlorperazine (COMPAZINE) 10 MG tablet Take 1 tablet (10 mg total) by mouth every 6 (six) hours as needed (Nausea or vomiting). (Patient not taking: Reported on 02/07/2021) 30 tablet 1   No current facility-administered medications for this encounter.   Facility-Administered Medications Ordered in Other Encounters  Medication Dose Route Frequency Provider Last Rate Last Admin  . sodium chloride flush (NS) 0.9 %  injection 10 mL  10 mL Intracatheter PRN Ladell Pier, MD         REVIEW OF SYSTEMS: On review of systems, the patient reports that she is doing well overall. 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 She's having drainage from her breast on multiple occurences. She sees Dr. Georgette Dover on 02/21/21.     PHYSICAL EXAM:  In general this is a well appearing caucasian  in no acute distress. She's alert and oriented x4 and appropriate throughout the examination. Cardiopulmonary assessment is negative for acute distress and she exhibits normal effort.    ECOG = 1  0 - Asymptomatic (Fully active, able to carry on all predisease activities without restriction)  1 - Symptomatic but completely ambulatory (Restricted in physically strenuous activity but ambulatory and able to carry out work of a light or sedentary nature. For example, light housework, office work)  2 - Symptomatic, <50% in bed during the day (Ambulatory and  capable of all self care but unable to carry out any work activities. Up and about more than 50% of waking hours)  3 - Symptomatic, >50% in bed, but not bedbound (Capable of only limited self-care, confined to bed or chair 50% or more of waking hours)  4 - Bedbound (Completely disabled. Cannot carry on any self-care. Totally confined to bed or chair)  5 - Death   Eustace Pen MM, Creech RH, Tormey DC, et al. [phone]). "Toxicity and response criteria of the [facility] Group". [place] Oncol. 5 (6): 649-55    LABORATORY DATA:  Lab Results  Component Value Date   WBC 6.4 12/08/2020   HGB 10.8 (L) 12/08/2020   HCT 32.8 (L) 12/08/2020   MCV 109.0 (H) 12/08/2020   PLT 186 12/08/2020   Lab Results  Component Value Date   NA 138 12/08/2020   K 3.7 12/08/2020   CL 106 12/08/2020   CO2 27 12/08/2020   Lab Results  Component Value Date   ALT 19 12/08/2020   AST 21 12/08/2020   ALKPHOS 78 12/08/2020   BILITOT 0.6 12/08/2020      RADIOGRAPHY: NM Sentinel  Node Inj-No Rpt (Breast)  Result Date: 01/12/2021 Sulfur colloid was injected by the nuclear medicine technologist for melanoma sentinel node.   MM Breast Surgical Specimen  Result Date: 01/12/2021 CLINICAL DATA:  Evaluate specimen EXAM: SPECIMEN RADIOGRAPH OF THE LEFT BREAST COMPARISON:  Previous exam(s). FINDINGS: Status post excision of the left breast. The radioactive seed and biopsy marker clip are present, completely intact, and were marked for pathology. IMPRESSION: Specimen radiograph of the left breast. Electronically Signed   By: Dorise Bullion III M.D   On: 01/12/2021 09:34   MM Breast Surgical Specimen  Result Date: 01/12/2021 CLINICAL DATA:  Status post excisional biopsy of the left breast. EXAM: SPECIMEN RADIOGRAPH OF THE LEFT BREAST COMPARISON:  Previous exam(s). FINDINGS: Status post excision of the left breast. The radioactive seed and coil shaped biopsy marker clip are present, completely intact, and were marked for pathology. IMPRESSION: Specimen radiograph of the left breast. Electronically Signed   By: Lillia Mountain M.D.   On: 01/12/2021 09:22   US BREAST LTD UNI LEFT INC AXILLA  Result Date: 01/11/2021 CLINICAL DATA:  The patient presented for localization of a left axillary lymph node. Unfortunately, the lymph node was marked with a standard heart shaped biopsy clip. A Q shaped clip could not be used due to her nickel allergy. No HydroMARK clips were available to time of biopsy. EXAM: ULTRASOUND OF THE LEFT AXILLA COMPARISON:  Previous exam(s). FINDINGS: On physical exam, no suspicious lumps are identified. Targeted ultrasound is performed, showing normal nodes in the left axilla. The previously biopsied lymph node could not be identified. No biopsy clip was seen. IMPRESSION: The previously biopsied lymph node which was determined to be reactive by pathology could not be found on today's ultrasound. These findings were called to the referring surgeon. RECOMMENDATION: Recommend  continued surgical follow-up. I have discussed the findings and recommendations with the patient. If applicable, a reminder letter will be sent to the patient regarding the next appointment. BI-RADS CATEGORY  2: Benign. Electronically Signed   By: Dorise Bullion III M.D   On: 01/11/2021 12:05   MM LT RADIOACTIVE SEED LOC MAMMO GUIDE  Result Date: 01/11/2021 CLINICAL DATA:  Localization in the left breast at the site of the patient's known invasive malignancy posteriorly and the patient has known DCIS anteriorly EXAM: MAMMOGRAPHIC  GUIDED RADIOACTIVE SEED LOCALIZATION OF THE LEFT BREAST COMPARISON:  Previous exam(s). FINDINGS: Patient presents for radioactive seed localization prior to surgery. I met with the patient and we discussed the procedure of seed localization including benefits and alternatives. We discussed the high likelihood of a successful procedure. We discussed the risks of the procedure including infection, bleeding, tissue injury and further surgery. We discussed the low dose of radioactivity involved in the procedure. Informed, written consent was given. The usual time-out protocol was performed immediately prior to the procedure. Using mammographic guidance, sterile technique, 1% lidocaine and an I-125 radioactive seed, the ribbon shaped clip at the site of the patient's treated invasive malignancy in the posteromedial left breast was localized using a medial approach. The follow-up mammogram images confirm the seed in the expected location and were marked for the surgeon. Follow-up survey of the patient confirms presence of the radioactive seed. Order number of I-125 seed:  [id number]. Total activity:  8.182 millicurie reference Date: January 07, 2021 Using mammographic guidance, sterile technique, 1% lidocaine and an I-125 radioactive seed, the coil shaped clip at the site of the biopsied DCIS was localized using a medial approach. The follow-up mammogram images confirm the seed in the expected  location and were marked for the surgeon. Follow-up survey of the patient confirms presence of the radioactive seed. Order number of I-125 seed:  [id number]. Total activity:  8.938 millicuries reference Date: December 16, 2020 The patient tolerated the procedure well and was released from the [place]. She was given instructions regarding seed removal. IMPRESSION: Radioactive seed localizations left breast. No apparent complications. Electronically Signed   By: Dorise Bullion III M.D   On: 01/11/2021 12:14   MM LT RAD SEED EA ADD LESION LOC MAMMO  Result Date: 01/11/2021 CLINICAL DATA:  Localization in the left breast at the site of the patient's known invasive malignancy posteriorly and the patient has known DCIS anteriorly EXAM: MAMMOGRAPHIC GUIDED RADIOACTIVE SEED LOCALIZATION OF THE LEFT BREAST COMPARISON:  Previous exam(s). FINDINGS: Patient presents for radioactive seed localization prior to surgery. I met with the patient and we discussed the procedure of seed localization including benefits and alternatives. We discussed the high likelihood of a successful procedure. We discussed the risks of the procedure including infection, bleeding, tissue injury and further surgery. We discussed the low dose of radioactivity involved in the procedure. Informed, written consent was given. The usual time-out protocol was performed immediately prior to the procedure. Using mammographic guidance, sterile technique, 1% lidocaine and an I-125 radioactive seed, the ribbon shaped clip at the site of the patient's treated invasive malignancy in the posteromedial left breast was localized using a medial approach. The follow-up mammogram images confirm the seed in the expected location and were marked for the surgeon. Follow-up survey of the patient confirms presence of the radioactive seed. Order number of I-125 seed:  [id number]. Total activity:  8.527 millicurie reference Date: January 07, 2021 Using mammographic guidance,  sterile technique, 1% lidocaine and an I-125 radioactive seed, the coil shaped clip at the site of the biopsied DCIS was localized using a medial approach. The follow-up mammogram images confirm the seed in the expected location and were marked for the surgeon. Follow-up survey of the patient confirms presence of the radioactive seed. Order number of I-125 seed:  [id number]. Total activity:  1.443 millicuries reference Date: December 16, 2020 The patient tolerated the procedure well and was released from the [facility]. She was given instructions regarding seed removal. IMPRESSION: Radioactive  seed localizations left breast. No apparent complications. Electronically Signed   By: Dorise Bullion III M.D   On: 01/11/2021 12:14       IMPRESSION/PLAN: 1. Stage IIB, cT2N0M0 grade 3, triple negative invasive ductal carcinoma of the left breast with complete response from neoadjuvant chemotherapy. Dr. Lisbeth Renshaw discusses the final pathology findings and reviews the course of her care. She is now complete with systemic treatments, and is healing well.  Dr. Lisbeth Renshaw discusses the rationale for adjuvant radiation, and the patient is in agreement with this plan.. We discussed the risks, benefits, short, and long term effects of radiotherapy, and the patient is interested in proceeding. Dr. Lisbeth Renshaw discusses the delivery and logistics of radiotherapy and anticipates a course of 4 weeks of radiotherapy to the left breast with deep inspiration breath hold technique however the patient is interested in prone. She will come on 03/08/21 due to her work schedule for simulation at which time she will sign written consent to proceed. She will also continue with plans for [facility] with Dr. Delton Coombes.  This encounter was provided by telemedicine platform MyChart.  The patient has provided two factor identification and has given verbal consent for this type of encounter and has been advised to only accept a meeting of this type in a secure  network environment. The time spent during this encounter was 45 minutes including preparation, discussion, and coordination of the patient's care. The attendants for this meeting include Dr. Lisbeth Renshaw, Hayden Pedro  and Marko Plume.  During the encounter, Dr. Lisbeth Renshaw was located at [facility] Radiation Oncology Department. Hayden Pedro was located remotely at home. Stevana Dufner was located at home.     The above documentation reflects my direct findings during this shared patient visit. Please see the separate note by Dr. Lisbeth Renshaw on this date for the remainder of the patient's plan of care.    Carola Rhine, PAC

## 2021-02-10 ENCOUNTER — Other Ambulatory Visit (HOSPITAL_COMMUNITY): Payer: Self-pay | Admitting: Hematology

## 2021-02-15 ENCOUNTER — Inpatient Hospital Stay (HOSPITAL_COMMUNITY): Payer: BC Managed Care – PPO

## 2021-02-15 ENCOUNTER — Inpatient Hospital Stay (HOSPITAL_BASED_OUTPATIENT_CLINIC_OR_DEPARTMENT_OTHER): Payer: BC Managed Care – PPO | Admitting: Hematology

## 2021-02-15 ENCOUNTER — Inpatient Hospital Stay (HOSPITAL_COMMUNITY): Payer: BC Managed Care – PPO | Attending: Hematology

## 2021-02-15 ENCOUNTER — Other Ambulatory Visit: Payer: Self-pay

## 2021-02-15 VITALS — BP 131/70 | HR 68 | Temp 96.9°F | Resp 18 | Wt 158.2 lb

## 2021-02-15 VITALS — BP 119/78 | HR 70 | Temp 97.0°F | Resp 18

## 2021-02-15 DIAGNOSIS — Z79899 Other long term (current) drug therapy: Secondary | ICD-10-CM | POA: Insufficient documentation

## 2021-02-15 DIAGNOSIS — R5383 Other fatigue: Secondary | ICD-10-CM | POA: Diagnosis not present

## 2021-02-15 DIAGNOSIS — R197 Diarrhea, unspecified: Secondary | ICD-10-CM | POA: Insufficient documentation

## 2021-02-15 DIAGNOSIS — Z803 Family history of malignant neoplasm of breast: Secondary | ICD-10-CM | POA: Diagnosis not present

## 2021-02-15 DIAGNOSIS — R200 Anesthesia of skin: Secondary | ICD-10-CM | POA: Insufficient documentation

## 2021-02-15 DIAGNOSIS — Z171 Estrogen receptor negative status [ER-]: Secondary | ICD-10-CM | POA: Insufficient documentation

## 2021-02-15 DIAGNOSIS — C50812 Malignant neoplasm of overlapping sites of left female breast: Secondary | ICD-10-CM

## 2021-02-15 DIAGNOSIS — Z5112 Encounter for antineoplastic immunotherapy: Secondary | ICD-10-CM | POA: Diagnosis present

## 2021-02-15 DIAGNOSIS — Z800 Family history of malignant neoplasm of digestive organs: Secondary | ICD-10-CM | POA: Diagnosis not present

## 2021-02-15 DIAGNOSIS — Z8379 Family history of other diseases of the digestive system: Secondary | ICD-10-CM | POA: Insufficient documentation

## 2021-02-15 DIAGNOSIS — N6311 Unspecified lump in the right breast, upper outer quadrant: Secondary | ICD-10-CM | POA: Diagnosis not present

## 2021-02-15 DIAGNOSIS — N6314 Unspecified lump in the right breast, lower inner quadrant: Secondary | ICD-10-CM | POA: Diagnosis not present

## 2021-02-15 DIAGNOSIS — Z8249 Family history of ischemic heart disease and other diseases of the circulatory system: Secondary | ICD-10-CM | POA: Diagnosis not present

## 2021-02-15 DIAGNOSIS — G629 Polyneuropathy, unspecified: Secondary | ICD-10-CM | POA: Insufficient documentation

## 2021-02-15 DIAGNOSIS — K5900 Constipation, unspecified: Secondary | ICD-10-CM | POA: Diagnosis not present

## 2021-02-15 DIAGNOSIS — Z880 Allergy status to penicillin: Secondary | ICD-10-CM | POA: Diagnosis not present

## 2021-02-15 LAB — CBC WITH DIFFERENTIAL/PLATELET
Abs Immature Granulocytes: 0.02 10*3/uL (ref 0.00–0.07)
Basophils Absolute: 0 10*3/uL (ref 0.0–0.1)
Basophils Relative: 1 %
Eosinophils Absolute: 0.2 10*3/uL (ref 0.0–0.5)
Eosinophils Relative: 4 %
HCT: 37.1 % (ref 36.0–46.0)
Hemoglobin: 12.2 g/dL (ref 12.0–15.0)
Immature Granulocytes: 0 %
Lymphocytes Relative: 37 %
Lymphs Abs: 2 10*3/uL (ref 0.7–4.0)
MCH: 32.3 pg (ref 26.0–34.0)
MCHC: 32.9 g/dL (ref 30.0–36.0)
MCV: 98.1 fL (ref 80.0–100.0)
Monocytes Absolute: 0.5 10*3/uL (ref 0.1–1.0)
Monocytes Relative: 8 %
Neutro Abs: 2.6 10*3/uL (ref 1.7–7.7)
Neutrophils Relative %: 50 %
Platelets: 230 10*3/uL (ref 150–400)
RBC: 3.78 MIL/uL — ABNORMAL LOW (ref 3.87–5.11)
RDW: 13 % (ref 11.5–15.5)
WBC: 5.4 10*3/uL (ref 4.0–10.5)
nRBC: 0 % (ref 0.0–0.2)

## 2021-02-15 LAB — COMPREHENSIVE METABOLIC PANEL
ALT: 19 U/L (ref 0–44)
AST: 21 U/L (ref 15–41)
Albumin: 3.8 g/dL (ref 3.5–5.0)
Alkaline Phosphatase: 94 U/L (ref 38–126)
Anion gap: 2 — ABNORMAL LOW (ref 5–15)
BUN: 12 mg/dL (ref 8–23)
CO2: 27 mmol/L (ref 22–32)
Calcium: 8.9 mg/dL (ref 8.9–10.3)
Chloride: 107 mmol/L (ref 98–111)
Creatinine, Ser: 0.58 mg/dL (ref 0.44–1.00)
GFR, Estimated: >60 mL/min (ref >60)
Glucose, Bld: 93 mg/dL (ref 70–99)
Potassium: 3.7 mmol/L (ref 3.5–5.1)
Sodium: 136 mmol/L (ref 135–145)
Total Bilirubin: 0.4 mg/dL (ref 0.3–1.2)
Total Protein: 6.8 g/dL (ref 6.5–8.1)

## 2021-02-15 LAB — T4, FREE: Free T4: 1.26 ng/dL — ABNORMAL HIGH (ref 0.61–1.12)

## 2021-02-15 LAB — TSH: TSH: 1.192 u[IU]/mL (ref 0.350–4.500)

## 2021-02-15 MED ORDER — SODIUM CHLORIDE 0.9 % IV SOLN: Freq: Once | INTRAVENOUS | Status: AC

## 2021-02-15 MED ORDER — HEPARIN SOD (PORK) LOCK FLUSH 100 UNIT/ML IV SOLN
500.0000 [IU] | Freq: Once | INTRAVENOUS | Status: AC | PRN
  Administered 2021-02-15: 500 [IU]

## 2021-02-15 MED ORDER — SODIUM CHLORIDE 0.9 % IV SOLN
200.0000 mg | Freq: Once | INTRAVENOUS | Status: AC
  Administered 2021-02-15: 200 mg via INTRAVENOUS
  Filled 2021-02-15: qty 8

## 2021-02-15 MED ORDER — SODIUM CHLORIDE 0.9% FLUSH
10.0000 mL | INTRAVENOUS | Status: DC | PRN
  Administered 2021-02-15: 10 mL

## 2021-02-15 NOTE — Patient Instructions (Signed)
[facility] at [facility] Discharge Instructions  You were seen today by Dr. Katragadda. He went over your recent results. You received your treatment today. Dr. Katragadda will see you back in 3 weeks for labs and follow up.   Thank you for choosing [facility] at [facility] to provide your oncology and hematology care.  To afford each patient quality time with our provider, please arrive at least 15 minutes before your scheduled appointment time.   If you have a lab appointment with the Cancer Center please come in thru the Main Entrance and check in at the main information desk  You need to re-schedule your appointment should you arrive 10 or more minutes late.  We strive to give you quality time with our providers, and arriving late affects you and other patients whose appointments are after yours.  Also, if you no show three or more times for appointments you may be dismissed from the clinic at the providers discretion.     Again, thank you for choosing [facility].  Our hope is that these requests will decrease the amount of time that you wait before being seen by our physicians.       _____________________________________________________________  Should you have questions after your visit to [facility], please contact our office at [phone] between the hours of 8:00 a.m. and 4:30 p.m.  Voicemails left after 4:00 p.m. will not be returned until the following business day.  For prescription refill requests, have your pharmacy contact our office and allow 72 hours.    Cancer Center Support Programs:   > Cancer Support Group  2nd Tuesday of the month 1pm-2pm, Journey Room    

## 2021-02-15 NOTE — Progress Notes (Signed)
Patient was assessed by Dr. Katragadda and labs have been reviewed.  Patient is okay to proceed with treatment today. Primary RN and pharmacy aware.   

## 2021-02-15 NOTE — Patient Instructions (Signed)
[facility]  Discharge Instructions: Thank you for choosing [facility] to provide your oncology and hematology care.  If you have a lab appointment with the Cancer Center, please come in thru the Main Entrance and check in at the main information desk.  Wear comfortable clothing and clothing appropriate for easy access to any Portacath or PICC line.   We strive to give you quality time with your provider. You may need to reschedule your appointment if you arrive late (15 or more minutes).  Arriving late affects you and other patients whose appointments are after yours.  Also, if you miss three or more appointments without notifying the office, you may be dismissed from the clinic at the provider's discretion.      For prescription refill requests, have your pharmacy contact our office and allow 72 hours for refills to be completed.    Today you received the following chemotherapy and/or immunotherapy agents Keytruda       To help prevent nausea and vomiting after your treatment, we encourage you to take your nausea medication as directed.  BELOW ARE SYMPTOMS THAT SHOULD BE REPORTED IMMEDIATELY: *FEVER GREATER THAN 100.4 F (38 C) OR HIGHER *CHILLS OR SWEATING *NAUSEA AND VOMITING THAT IS NOT CONTROLLED WITH YOUR NAUSEA MEDICATION *UNUSUAL SHORTNESS OF BREATH *UNUSUAL BRUISING OR BLEEDING *URINARY PROBLEMS (pain or burning when urinating, or frequent urination) *BOWEL PROBLEMS (unusual diarrhea, constipation, pain near the anus) TENDERNESS IN MOUTH AND THROAT WITH OR WITHOUT PRESENCE OF ULCERS (sore throat, sores in mouth, or a toothache) UNUSUAL RASH, SWELLING OR PAIN  UNUSUAL VAGINAL DISCHARGE OR ITCHING   Items with * indicate a potential emergency and should be followed up as soon as possible or go to the Emergency Department if any problems should occur.  Please show the CHEMOTHERAPY ALERT CARD or IMMUNOTHERAPY ALERT CARD at check-in to the Emergency  Department and triage nurse.  Should you have questions after your visit or need to cancel or reschedule your appointment, please contact [facility] [phone]  and follow the prompts.  Office hours are 8:00 a.m. to 4:30 p.m. Monday - Friday. Please note that voicemails left after 4:00 p.m. may not be returned until the following business day.  We are closed weekends and major holidays. You have access to a nurse at all times for urgent questions. Please call the main number to the clinic [phone] and follow the prompts.  For any non-urgent questions, you may also contact your provider using MyChart. We now offer e-Visits for anyone 18 and older to request care online for non-urgent symptoms. For details visit [email or web address].   Also download the MyChart app! Go to the app store, search "MyChart", open the app, select [facility], and log in with your MyChart username and password.  Due to Covid, a mask is required upon entering the hospital/clinic. If you do not have a mask, one will be given to you upon arrival. For doctor visits, patients may have 1 support person aged 18 or older with them. For treatment visits, patients cannot have anyone with them due to current Covid guidelines and our immunocompromised population.  

## 2021-02-15 NOTE — Progress Notes (Signed)
Patient has been examined, vital signs and labs have been reviewed by Dr. Katragadda. ANC, Creatinine, LFTs, hemoglobin, and platelets are within treatment parameters per Dr. Katragadda. Patient is okay to proceed with treatment per M.D.   

## 2021-02-15 NOTE — Progress Notes (Signed)
[address] [place], [place] [address]   CLINIC:  Medical Oncology/Hematology  PCP:  Roderic Scarce, MD [address] 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 Carboplatin and paclitaxel x 8 cycles from 06/16/2020 to 12/08/2020. 2. Left breast lumpectomy on 01/12/2021.  NGS Results: ER/PR/HER-2 negative, Ki-67 85%  CURRENT THERAPY: Keytruda every 3 weeks  BRIEF ONCOLOGIC HISTORY:  Oncology History  Malignant neoplasm of overlapping sites of left breast in female, estrogen receptor negative ([facility])  05/17/2020 Cancer Staging   Staging form: Breast, AJCC 8th Edition - Clinical stage from 05/17/2020: Stage IIB (cT2, cN0, cM0, G3, ER-, PR-, HER2-) - Signed by Gardenia Phlegm, NP on 05/26/2020   05/26/2020 Initial Diagnosis   Patient palpated a left breast mass for 5 months. Mammogram and US showed a 2.7cm mass at the 10:30 position, one left axillary lymph node with cortical thickening, calcifications in the lower inner left breast, and a 0.4cm group of calcifications in the right breast. Biopsy on 05/14/20 showed no malignancy in the right breast, and in the left breast, DCIS, high grade, ER/PR negative. 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    06/16/2020 -  Chemotherapy    Patient is on Treatment Plan: BREAST AC Q21D / CARBOPLATIN D1 + PACLITAXEL D1,8,15 Q21D      09/22/2020 Genetic Testing   Negative genetic testing. No pathogenic variants identified on the [facility] Common Hereditary Cancers Panel. The report date is 09/22/2020.  The Common Hereditary Cancers Panel offered by [facility] includes sequencing and/or deletion duplication testing of the following 48 genes: APC, ATM, AXIN2, BARD1, BMPR1A, BRCA1, BRCA2, BRIP1, CDH1,  CDKN2A (p14ARF), CDKN2A (p16INK4a), CKD4, CHEK2, CTNNA1, DICER1, EPCAM (Deletion/duplication testing only), GREM1 (promoter region deletion/duplication testing only), KIT, MEN1, MLH1, MSH2, MSH3, MSH6, MUTYH, NBN, NF1, NHTL1, PALB2, PDGFRA, PMS2, POLD1, POLE, PTEN, RAD50, RAD51C, RAD51D, RNF43, SDHB, SDHC, SDHD, SMAD4, SMARCA4. STK11, TP53, TSC1, TSC2, and VHL.  The following genes were evaluated for sequence changes only: SDHA and HOXB13 c.251G>A variant only.     CANCER STAGING: Cancer Staging Malignant neoplasm of overlapping sites of left breast in female, estrogen receptor negative ([facility]) Staging form: Breast, AJCC 8th Edition - Clinical stage from 05/17/2020: Stage IIB (cT2, cN0, cM0, G3, ER-, PR-, HER2-) - Signed by Gardenia Phlegm, NP on 05/26/2020   INTERVAL HISTORY:  Jo Mills, a 70 y.o. female, returns for routine follow-up and consideration for next cycle of immunotherapy. Jo Mills was last seen on 01/31/2021.  Due for cycle #9 of Keytruda today.   Today she is accompanied by her husband. Overall, she tells me she has been feeling pretty well. Her targeted start date for radiation is June 9th since her mastectomy incision has not fully healed; she denies having drainage or abnormal smell from the lumpectomy incision for the past 1 week. She has been applying a triple antibiotic ointment on it. She reports having 3 episodes of drainage from her lumpectomy scar. She reports having skin dryness but denies skin rashes. She is currently taking Synthroid 100 mcg daily. She has also started taking vitamin B12 5,000 units daily since 04/30. She denies having any new cough.  She is scheduled to see Dr. Georgette Dover on 05/09.  Overall, she  feels ready for next cycle of immunotherapy today.    REVIEW OF SYSTEMS:  Review of Systems  Constitutional: Positive for fatigue (75%). Negative for appetite change.  Respiratory: Negative for cough.   Skin: Negative for rash.  All other systems  reviewed and are negative.   PAST MEDICAL/SURGICAL HISTORY:  Past Medical History:  Diagnosis Date  . Allergy   . Breast cancer ([facility])   . Cancer ([facility])   . Diverticulitis   . Family history of breast cancer 08/17/2020  . Family history of pancreatic cancer 08/17/2020  . GERD (gastroesophageal reflux disease)   . Hypertension   . Hypothyroidism   . Skin cancer    Past Surgical History:  Procedure Laterality Date  . BREAST LUMPECTOMY WITH RADIOACTIVE SEED LOCALIZATION Left 01/12/2021   Procedure: LEFT BREAST LUMPECTOMY X 2 WITH RADIOACTIVE SEED LOCALIZATION;  Surgeon: Donnie Mesa, MD;  Location: [facility];  Service: General;  Laterality: Left;  . COLONOSCOPY    . PORTACATH PLACEMENT N/A 06/15/2020   Procedure: INSERTION PORT-A-CATH WITH ULTRASOUND GUIDANCE;  Surgeon: Donnie Mesa, MD;  Location: [facility];  Service: General;  Laterality: Right;  LMA, LEAVE PORT ACCESSED-CHEMO START 9/1  . SENTINEL NODE BIOPSY N/A 01/12/2021   Procedure: SENTINEL NODE BIOPSY;  Surgeon: Donnie Mesa, MD;  Location: [facility];  Service: General;  Laterality: N/A;    SOCIAL HISTORY:  Social History   Socioeconomic History  . Marital status: Married    Spouse name: Not on file  . Number of children: Not on file  . Years of education: Not on file  . Highest education level: Not on file  Occupational History  . Occupation: Naval architect  Tobacco Use  . Smoking status: Never Smoker  . Smokeless tobacco: Never Used  Substance and Sexual Activity  . Alcohol use: Not Currently  . Drug use: Never  . Sexual activity: Not on file  Other Topics Concern  . Not on file  Social History Narrative  . Not on file   Social Determinants of Health   Financial Resource Strain: Low Risk   . Difficulty of Paying Living Expenses: Not hard at all  Food Insecurity: No Food Insecurity  . Worried About Charity fundraiser in the Last Year: Never true  .  Ran Out of Food in the Last Year: Never true  Transportation Needs: No Transportation Needs  . Lack of Transportation (Medical): No  . Lack of Transportation (Non-Medical): No  Physical Activity: Sufficiently Active  . Days of Exercise per Week: 7 days  . Minutes of Exercise per Session: 60 min  Stress: Stress Concern Present  . Feeling of Stress : To some extent  Social Connections: Moderately Integrated  . Frequency of Communication with Friends and Family: Once a week  . Frequency of Social Gatherings with Friends and Family: Never  . Attends Religious Services: More than 4 times per year  . Active Member of Clubs or Organizations: Yes  . Attends Archivist Meetings: 1 to 4 times per year  . Marital Status: Married  Human resources officer Violence: Not At Risk  . Fear of Current or Ex-Partner: No  . Emotionally Abused: No  . Physically Abused: No  . Sexually Abused: No    FAMILY HISTORY:  Family History  Problem Relation Age of Onset  . Diverticulitis Mother   . Hypertension Maternal Aunt   . Hypertension Maternal Uncle   . Cancer Paternal Grandmother  unknown type; d. 47s  . Breast cancer Cousin 68       paternal   . Pancreatic cancer Cousin        paternal; dx 40s    CURRENT MEDICATIONS:  Current Outpatient Medications  Medication Sig Dispense Refill  . amLODipine-benazepril (LOTREL) 5-20 MG capsule Take 1 capsule by mouth daily.    . Bismuth Subsalicylate (PEPTO-BISMOL PO) Take by mouth as needed.    Marland Kitchen dexamethasone (DECADRON) 4 MG tablet Take 1 tablet daily for 3 days after chemo with food (Patient not taking: Reported on 02/07/2021) 30 tablet 1  . fexofenadine (ALLEGRA) 180 MG tablet Take 180 mg by mouth daily.    . fluconazole (DIFLUCAN) 100 MG tablet Take 1 tablet (100 mg total) by mouth daily. (Patient not taking: Reported on 02/07/2021) 7 tablet 0  . fluticasone (FLONASE) 50 MCG/ACT nasal spray Place into both nostrils daily.    Marland Kitchen ketoconazole  (NIZORAL) 2 % cream Apply 1 application topically daily. 30 g 1  . Lactobacillus-Inulin (CULTURELLE DIGESTIVE DAILY) CAPS Take 1 capsule by mouth daily.    Marland Kitchen levothyroxine (SYNTHROID) 100 MCG tablet Take 1 tablet (100 mcg total) by mouth daily before breakfast. 30 tablet 2  . lidocaine-prilocaine (EMLA) cream SMARTSIG:1 Topical Every Night (Patient not taking: Reported on 02/07/2021)    . LORazepam (ATIVAN) 0.5 MG tablet Take 1 tablet (0.5 mg total) by mouth at bedtime as needed for sleep. (Patient not taking: Reported on 02/07/2021) 30 tablet 0  . magic mouthwash SOLN Take 5 mLs by mouth 3 (three) times daily as needed for mouth pain. (Patient not taking: Reported on 02/07/2021) 450 mL 0  . omeprazole (PRILOSEC) 20 MG capsule Take 20 mg by mouth daily.    . ondansetron (ZOFRAN) 8 MG tablet Take 1 tablet (8 mg total) by mouth 2 (two) times daily as needed. Start on the third day after chemotherapy. (Patient not taking: Reported on 02/07/2021) 30 tablet 1  . prochlorperazine (COMPAZINE) 10 MG tablet Take 1 tablet (10 mg total) by mouth every 6 (six) hours as needed (Nausea or vomiting). (Patient not taking: Reported on 02/07/2021) 30 tablet 1   No current facility-administered medications for this visit.   Facility-Administered Medications Ordered in Other Visits  Medication Dose Route Frequency Provider Last Rate Last Admin  . sodium chloride flush (NS) 0.9 % injection 10 mL  10 mL Intracatheter PRN Ladell Pier, MD        ALLERGIES:  Allergies  Allergen Reactions  . Grapefruit Extract Other (See Comments)    Cannot take with blood pressure medications  . Pseudoephedrine Hcl Other (See Comments)    Other Reaction: Other reaction  . Nickel   . Pineapple   . Amoxicillin Rash    PHYSICAL EXAM:  Performance status (ECOG): 1 - Symptomatic but completely ambulatory  Vitals:   02/15/21 1228  BP: 131/70  Pulse: 68  Resp: 18  Temp: (!) 96.9 F (36.1 C)  SpO2: 99%   Wt Readings from  Last 3 Encounters:  02/15/21 158 lb 3.2 oz (71.8 kg)  01/31/21 161 lb (73 kg)  01/12/21 155 lb 6.8 oz (70.5 kg)   Physical Exam Chest:  Breasts:     Left: Skin change (12 o'clock incision healing) present. No tenderness.      Comments: Port-a-Cath in R chest    LABORATORY DATA:  I have reviewed the labs as listed.  CBC Latest Ref Rng & Units 02/15/2021 12/08/2020 12/07/2020  WBC 4.0 -  10.5 K/uL 5.4 6.4 3.1(L)  Hemoglobin 12.0 - 15.0 g/dL 12.2 10.8(L) 10.6(L)  Hematocrit 36.0 - 46.0 % 37.1 32.8(L) 32.3(L)  Platelets 150 - 400 K/uL 230 186 198   CMP Latest Ref Rng & Units 02/15/2021 12/08/2020 12/07/2020  Glucose 70 - 99 mg/dL 93 66(L) 90  BUN 8 - 23 mg/dL _0 Creatinine 0.44 - 1.00 mg/dL 0.58 0.58 0.57  Sodium 135 - 145 mmol/L 136 138 134(L)  Potassium 3.5 - 5.1 mmol/L 3.7 3.7 4.2  Chloride 98 - 111 mmol/L 107 106 103  CO2 22 - 32 mmol/L _1 Calcium 8.9 - 10.3 mg/dL 8.9 9.0 8.7(L)  Total Protein 6.5 - 8.1 g/dL 6.8 6.1(L) 6.3(L)  Total Bilirubin 0.3 - 1.2 mg/dL 0.4 0.6 0.5  Alkaline Phos 38 - 126 U/L 94 78 69  AST 15 - 41 U/L _2 ALT 0 - 44 U/L _3 DIAGNOSTIC IMAGING:  I have independently reviewed the scans and discussed with the patient. No results found.   ASSESSMENT:  1. Stage II (T2N0) left breast TNBC: -Felt left breast mass for 5 months. -Mammogram on 05/07/2020 with suspicious mass in the 10:30 o'clock position in the left breast. Suspicious calcifications in the anterior lower inner quadrant of the left breast. -MRI on 06/05/2020 shows left breast mass measuring 3.8 x 3.3 x 3.2 cm in the upper inner quadrant. 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 No suspicious lymphadenopathy. No MRI evidence of malignancy on the right. -Left breast upper inner quadrant biopsy on 05/14/2020 showed DCIS with calcifications. Right breast biopsy shows fibroadenomatoid nodule with calcifications. ER/PR was negative. -Left breast  core needle biopsy at 10:30 position shows invasive ductal carcinoma. ER/PR/HER-2 was negative. Ki-67 85%. Left lymph node biopsy was reactive. -Patient was evaluated by Dr. [name] and Dr. Georgette Dover. -She received first cycle of chemotherapy with Adriamycin and cyclophosphamide with pembrolizumab (keynote-522 trial)added for improved PCR by about 15%. -Cycle 1 of Adriamycin, cyclophosphamide and pembrolizumab on9/10/2019. - Left lumpectomy and SLNB on 01/12/2021, YPT0, ypN0.  2. Family history: -Paternal cousin and second cousin had breast cancer. Paternal grandmother also had some type of cancer. -Another paternal cousin died of pancreatic cancer. Another paternal cousin had leukemia.   PLAN:  1. Triple negative left breast cancer: -She had a pathological complete response with a good prognosis. - She reports some slight discharge at the incision site around the left areola.  She is seeing Dr. Georgette Dover on 02/21/2021.  She will have simulation scans around 03/08/2021 for radiation. - On examination the wound is well-healed.  Seroma has improved. - We reviewed her labs today which showed normal CMP and CBC. - Recommend initiating Keytruda every 3 weeks for 9 cycles starting today.  She can continue concurrent radiation with Keytruda. - RTC 3 weeks for follow-up.  2. Family history: -Genetic testing was negative.  3.Skin rash: -She had developed skin rash, unclear whether from  paclitaxel or Keytruda.  We will closely monitor since we are starting back on Keytruda.  4. High risk drug monitoring: -Continue Synthroid 100 mcg daily.  TSH today is 1.19.  5. Neuropathy: -Mild numbness in the toes is improving.  No numbness in the fingertips.   Orders placed this encounter:  No orders of the defined types were placed in this encounter.    Derek Jack, MD [facility] [phone]   I, Milinda Antis, am acting as a Education administrator  for Dr. Sanda Linger.  I, Derek Jack MD, have reviewed the above documentation for accuracy and completeness, and I agree with the above.

## 2021-02-15 NOTE — Progress Notes (Signed)
Patient presents today for treatment and follow up visit with Dr. Delton Coombes. Labs pending. Patient has no complaints of any changes since her last visit.   Treatment given today per MD orders. Tolerated infusion without adverse affects. Vital signs stable. No complaints at this time. Discharged from clinic ambulatory in stable condition. Alert and oriented x 3. F/U with [facility] as scheduled.

## 2021-03-07 NOTE — Progress Notes (Signed)
[facility] [address] [place] [address]   Patient Care Team: Virgina Norfolk, MD as PCP - General (Nurse Practitioner) Donnelly Angelica, RN as Oncology Nurse Navigator Pershing Proud, RN as Oncology Nurse Navigator Therese Sarah, RN as Oncology Nurse Navigator (Oncology)  SUMMARY OF ONCOLOGIC HISTORY: Oncology History  Malignant neoplasm of overlapping sites of left breast in female, estrogen receptor negative ([facility])  05/17/2020 Cancer Staging   Staging form: Breast, AJCC 8th Edition - Clinical stage from 05/17/2020: Stage IIB (cT2, cN0, cM0, G3, ER-, PR-, HER2-) - Signed by Loa Socks, NP on 05/26/2020   05/26/2020 Initial Diagnosis   Patient palpated a left breast mass for 5 months. Mammogram and US showed a 2.7cm mass at the 10:30 position, one left axillary lymph node with cortical thickening, calcifications in the lower inner left breast, and a 0.4cm group of calcifications in the right breast. Biopsy on 05/14/20 showed no malignancy in the right breast, and in the left breast, DCIS, high grade, ER/PR negative. 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    06/16/2020 -  Chemotherapy    Patient is on Treatment Plan: BREAST AC Q21D / CARBOPLATIN D1 + PACLITAXEL D1,8,15 Q21D      09/22/2020 Genetic Testing   Negative genetic testing. No pathogenic variants identified on the [facility] Common Hereditary Cancers Panel. The report date is 09/22/2020.  The Common Hereditary Cancers Panel offered by [facility] includes sequencing and/or deletion duplication testing of the following 48 genes: APC, ATM, AXIN2, BARD1, BMPR1A, BRCA1, BRCA2, BRIP1, CDH1, CDKN2A (p14ARF), CDKN2A (p16INK4a), CKD4, CHEK2, CTNNA1, DICER1, EPCAM (Deletion/duplication testing only), GREM1 (promoter region deletion/duplication testing only), KIT, MEN1,  MLH1, MSH2, MSH3, MSH6, MUTYH, NBN, NF1, NHTL1, PALB2, PDGFRA, PMS2, POLD1, POLE, PTEN, RAD50, RAD51C, RAD51D, RNF43, SDHB, SDHC, SDHD, SMAD4, SMARCA4. STK11, TP53, TSC1, TSC2, and VHL.  The following genes were evaluated for sequence changes only: SDHA and HOXB13 c.251G>A variant only.     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 is a 70 y.o. female here today for follow up of her triple negative left breast cancer. Her last visit was on 02/15/2021.   She reports feeling well today and denies any rash or itching of her skin. She reports mild dry skin which is treated by applying lotion daily. She reports occasional diarrhea which she attributes to diverticulitis. She reports numbness in her feet while walking but denies any numbness or tingling in her hands.  REVIEW OF SYSTEMS:   Review of Systems  Constitutional: Positive for appetite change (75%) and fatigue (75%).  Gastrointestinal: Positive for constipation and diarrhea.  Skin: Negative for itching and rash.  Neurological: Positive for numbness (feet).  All other systems reviewed and are negative.   I have reviewed the past medical history, past surgical history, social history and family history with the patient and they are unchanged from previous note.   ALLERGIES:   is allergic to grapefruit extract, pseudoephedrine hcl, nickel, pineapple, and amoxicillin.   MEDICATIONS:  Current Outpatient Medications  Medication Sig Dispense Refill  . amLODipine-benazepril (LOTREL) 5-20 MG capsule Take 1 capsule by mouth daily.    . Bismuth Subsalicylate (PEPTO-BISMOL PO) Take by mouth as needed.    Marland Kitchen dexamethasone (DECADRON) 4 MG tablet Take 1 tablet daily for 3  days after chemo with food (Patient not taking: Reported on 02/07/2021) 30 tablet 1  . fexofenadine (ALLEGRA) 180 MG tablet Take 180 mg by mouth daily.    . fluconazole (DIFLUCAN) 100 MG tablet Take 1 tablet (100 mg  total) by mouth daily. (Patient not taking: Reported on 02/07/2021) 7 tablet 0  . fluticasone (FLONASE) 50 MCG/ACT nasal spray Place into both nostrils daily.    Marland Kitchen ketoconazole (NIZORAL) 2 % cream Apply 1 application topically daily. 30 g 1  . Lactobacillus-Inulin (CULTURELLE DIGESTIVE DAILY) CAPS Take 1 capsule by mouth daily.    Marland Kitchen levothyroxine (SYNTHROID) 100 MCG tablet Take 1 tablet (100 mcg total) by mouth daily before breakfast. 30 tablet 2  . lidocaine-prilocaine (EMLA) cream SMARTSIG:1 Topical Every Night (Patient not taking: Reported on 02/07/2021)    . LORazepam (ATIVAN) 0.5 MG tablet Take 1 tablet (0.5 mg total) by mouth at bedtime as needed for sleep. (Patient not taking: Reported on 02/07/2021) 30 tablet 0  . magic mouthwash SOLN Take 5 mLs by mouth 3 (three) times daily as needed for mouth pain. (Patient not taking: Reported on 02/07/2021) 450 mL 0  . omeprazole (PRILOSEC) 20 MG capsule Take 20 mg by mouth daily.    . ondansetron (ZOFRAN) 8 MG tablet Take 1 tablet (8 mg total) by mouth 2 (two) times daily as needed. Start on the third day after chemotherapy. (Patient not taking: Reported on 02/07/2021) 30 tablet 1  . prochlorperazine (COMPAZINE) 10 MG tablet Take 1 tablet (10 mg total) by mouth every 6 (six) hours as needed (Nausea or vomiting). (Patient not taking: Reported on 02/07/2021) 30 tablet 1   No current facility-administered medications for this visit.   Facility-Administered Medications Ordered in Other Visits  Medication Dose Route Frequency Provider Last Rate Last Admin  . sodium chloride flush (NS) 0.9 % injection 10 mL  10 mL Intracatheter PRN Ladell Pier, MD         PHYSICAL EXAMINATION: Performance status (ECOG): 1 - Symptomatic but completely ambulatory  There were no vitals filed for this visit. Wt Readings from Last 3 Encounters:  02/15/21 158 lb 3.2 oz (71.8 kg)  01/31/21 161 lb (73 kg)  01/12/21 155 lb 6.8 oz (70.5 kg)   Physical Exam Vitals reviewed.   Constitutional:      Appearance: Normal appearance.  Cardiovascular:     Rate and Rhythm: Normal rate and regular rhythm.     Pulses: Normal pulses.     Heart sounds: Normal heart sounds.  Pulmonary:     Effort: Pulmonary effort is normal.     Breath sounds: Normal breath sounds.  Neurological:     General: No focal deficit present.     Mental Status: She is alert and oriented to person, place, and time.  Psychiatric:        Mood and Affect: Mood normal.        Behavior: Behavior normal.     Breast Exam Chaperone: Thana Ates     LABORATORY DATA:  I have reviewed the data as listed CMP Latest Ref Rng & Units 02/15/2021 12/08/2020 12/07/2020  Glucose 70 - 99 mg/dL 93 66(L) 90  BUN 8 - 23 mg/dL $Remove'12 13 15  'DTlxFyh$ Creatinine 0.44 - 1.00 mg/dL 0.58 0.58 0.57  Sodium 135 - 145 mmol/L 136 138 134(L)  Potassium 3.5 - 5.1 mmol/L 3.7 3.7 4.2  Chloride 98 - 111 mmol/L 107 106 103  CO2 22 - 32 mmol/L $RemoveB'27 27 27  'aXrQOkGZ$ Calcium 8.9 -  10.3 mg/dL 8.9 9.0 8.7(L)  Total Protein 6.5 - 8.1 g/dL 6.8 6.1(L) 6.3(L)  Total Bilirubin 0.3 - 1.2 mg/dL 0.4 0.6 0.5  Alkaline Phos 38 - 126 U/L 94 78 69  AST 15 - 41 U/L $Remo'21 21 21  'sBTvG$ ALT 0 - 44 U/L $Remo'19 19 17   'yaPbm$ No results found for: [id number] Lab Results  Component Value Date   WBC 5.4 02/15/2021   HGB 12.2 02/15/2021   HCT 37.1 02/15/2021   MCV 98.1 02/15/2021   PLT 230 02/15/2021   NEUTROABS 2.6 02/15/2021    ASSESSMENT:  1. Stage II (T2N0) left breast TNBC: -Felt left breast mass for 5 months. -Mammogram on 05/07/2020 with suspicious mass in the 10:30 o'clock position in the left breast. Suspicious calcifications in the anterior lower inner quadrant of the left breast. -MRI on 06/05/2020 shows left breast mass measuring 3.8 x 3.3 x 3.2 cm in the upper inner quadrant. 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 No suspicious lymphadenopathy. No MRI evidence of malignancy on the right. -Left breast upper inner quadrant biopsy on  05/14/2020 showed DCIS with calcifications. Right breast biopsy shows fibroadenomatoid nodule with calcifications. ER/PR was negative. -Left breast core needle biopsy at 10:30 position shows invasive ductal carcinoma. ER/PR/HER-2 was negative. Ki-67 85%. Left lymph node biopsy was reactive. -Patient was evaluated by Dr. [name] and Dr. Georgette Dover. -She received first cycle of chemotherapy with Adriamycin and cyclophosphamide with pembrolizumab (keynote-522 trial)added for improved PCR by about 15%. -Cycle 1 of Adriamycin, cyclophosphamide and pembrolizumab on9/10/2019. -Left lumpectomy and SLNB on 01/12/2021, YPT0, ypN0.  2. Family history: -Paternal cousin and second cousin had breast cancer. Paternal grandmother also had some type of cancer. -Another paternal cousin died of pancreatic cancer. Another paternal cousin had leukemia.   PLAN:  1. Triple negative left breast cancer: -She had a complete pathological response with good prognosis. - She has started back on Keytruda on 02/15/2021. - She did not experience any immunotherapy related side effects.  Reviewed her labs today which showed normal LFTs and CBC.  TSH was normal. - Recommend proceeding with Keytruda today and in 3 weeks.  RTC 6 weeks for follow-up. - She is planning to start radiation therapy on 03/17/2021.  2. Family history: -Genetic testing was negative.  3.Skin rash: -She had developed skin rash when she was receiving treatment prior to surgery. - She received Keytruda 3 weeks ago and has not had any rash.  4. High risk drug monitoring: -TSH today is 1.11.  Continue Synthroid 100 mcg daily.  We will closely monitor.  5. Neuropathy: -She has mild numbness in the toes which is stable.  Numbness in the fingertips has improved.  No neuropathic pains at this time.   Orders placed this encounter:  No orders of the defined types were placed in this encounter.   The patient has a good understanding of the  overall plan. She agrees with it. She will call with any problems that may develop before the next visit here.  Derek Jack, MD [facility] [phone]   I, Thana Ates, am acting as a scribe for Dr. Derek Jack.  I, Derek Jack MD, have reviewed the above documentation for accuracy and completeness, and I agree with the above.

## 2021-03-08 ENCOUNTER — Inpatient Hospital Stay (HOSPITAL_COMMUNITY): Payer: BC Managed Care – PPO

## 2021-03-08 ENCOUNTER — Inpatient Hospital Stay (HOSPITAL_BASED_OUTPATIENT_CLINIC_OR_DEPARTMENT_OTHER): Payer: BC Managed Care – PPO | Admitting: Hematology

## 2021-03-08 ENCOUNTER — Other Ambulatory Visit: Payer: Self-pay

## 2021-03-08 ENCOUNTER — Ambulatory Visit
Admission: RE | Admit: 2021-03-08 | Discharge: 2021-03-08 | Disposition: A | Discharge status: Home / Self Care | Payer: BC Managed Care – PPO | Source: Ambulatory Visit | Attending: Radiation Oncology | Admitting: Radiation Oncology

## 2021-03-08 VITALS — BP 109/67 | HR 60 | Temp 97.5°F | Resp 18

## 2021-03-08 VITALS — BP 130/77 | HR 68 | Temp 96.9°F | Resp 18 | Wt 161.0 lb

## 2021-03-08 DIAGNOSIS — Z171 Estrogen receptor negative status [ER-]: Secondary | ICD-10-CM | POA: Diagnosis not present

## 2021-03-08 DIAGNOSIS — C50812 Malignant neoplasm of overlapping sites of left female breast: Secondary | ICD-10-CM

## 2021-03-08 DIAGNOSIS — Z5112 Encounter for antineoplastic immunotherapy: Secondary | ICD-10-CM | POA: Diagnosis not present

## 2021-03-08 LAB — CBC WITH DIFFERENTIAL/PLATELET
Abs Immature Granulocytes: 0 10*3/uL (ref 0.00–0.07)
Basophils Absolute: 0 10*3/uL (ref 0.0–0.1)
Basophils Relative: 1 %
Eosinophils Absolute: 0.2 10*3/uL (ref 0.0–0.5)
Eosinophils Relative: 5 %
HCT: 37.5 % (ref 36.0–46.0)
Hemoglobin: 12.3 g/dL (ref 12.0–15.0)
Immature Granulocytes: 0 %
Lymphocytes Relative: 42 %
Lymphs Abs: 1.9 10*3/uL (ref 0.7–4.0)
MCH: 31.9 pg (ref 26.0–34.0)
MCHC: 32.8 g/dL (ref 30.0–36.0)
MCV: 97.2 fL (ref 80.0–100.0)
Monocytes Absolute: 0.3 10*3/uL (ref 0.1–1.0)
Monocytes Relative: 6 %
Neutro Abs: 2.1 10*3/uL (ref 1.7–7.7)
Neutrophils Relative %: 46 %
Platelets: 189 10*3/uL (ref 150–400)
RBC: 3.86 MIL/uL — ABNORMAL LOW (ref 3.87–5.11)
RDW: 13.3 % (ref 11.5–15.5)
WBC: 4.5 10*3/uL (ref 4.0–10.5)
nRBC: 0 % (ref 0.0–0.2)

## 2021-03-08 LAB — COMPREHENSIVE METABOLIC PANEL
ALT: 19 U/L (ref 0–44)
AST: 23 U/L (ref 15–41)
Albumin: 3.8 g/dL (ref 3.5–5.0)
Alkaline Phosphatase: 90 U/L (ref 38–126)
Anion gap: 5 (ref 5–15)
BUN: 17 mg/dL (ref 8–23)
CO2: 28 mmol/L (ref 22–32)
Calcium: 9 mg/dL (ref 8.9–10.3)
Chloride: 104 mmol/L (ref 98–111)
Creatinine, Ser: 0.8 mg/dL (ref 0.44–1.00)
GFR, Estimated: >60 mL/min (ref >60)
Glucose, Bld: 157 mg/dL — ABNORMAL HIGH (ref 70–99)
Potassium: 3.8 mmol/L (ref 3.5–5.1)
Sodium: 137 mmol/L (ref 135–145)
Total Bilirubin: 0.7 mg/dL (ref 0.3–1.2)
Total Protein: 6.4 g/dL — ABNORMAL LOW (ref 6.5–8.1)

## 2021-03-08 LAB — TSH: TSH: 1.109 u[IU]/mL (ref 0.350–4.500)

## 2021-03-08 MED ORDER — SODIUM CHLORIDE 0.9 % IV SOLN
200.0000 mg | Freq: Once | INTRAVENOUS | Status: AC
  Administered 2021-03-08: 200 mg via INTRAVENOUS
  Filled 2021-03-08: qty 8

## 2021-03-08 MED ORDER — HEPARIN SOD (PORK) LOCK FLUSH 100 UNIT/ML IV SOLN
500.0000 [IU] | Freq: Once | INTRAVENOUS | Status: AC | PRN
  Administered 2021-03-08: 500 [IU]

## 2021-03-08 MED ORDER — SODIUM CHLORIDE 0.9 % IV SOLN: Freq: Once | INTRAVENOUS | Status: AC

## 2021-03-08 MED ORDER — SODIUM CHLORIDE 0.9% FLUSH
10.0000 mL | INTRAVENOUS | Status: DC | PRN
  Administered 2021-03-08: 10 mL

## 2021-03-08 NOTE — Patient Instructions (Signed)
[facility] Discharge Instructions  You were seen today by Dr. Katragadda. He went over your recent results, and you received treatment. Dr. Katragadda will see you back in 6 weeks for labs and follow up.   Thank you for choosing [facility] at [facility] to provide your oncology and hematology care.  To afford each patient quality time with our provider, please arrive at least 15 minutes before your scheduled appointment time.   If you have a lab appointment with the Cancer Center please come in thru the Main Entrance and check in at the main information desk  You need to re-schedule your appointment should you arrive 10 or more minutes late.  We strive to give you quality time with our providers, and arriving late affects you and other patients whose appointments are after yours.  Also, if you no show three or more times for appointments you may be dismissed from the clinic at the providers discretion.     Again, thank you for choosing [facility].  Our hope is that these requests will decrease the amount of time that you wait before being seen by our physicians.       _____________________________________________________________  Should you have questions after your visit to [facility], please contact our office at [phone] between the hours of 8:00 a.m. and 4:30 p.m.  Voicemails left after 4:00 p.m. will not be returned until the following business day.  For prescription refill requests, have your pharmacy contact our office and allow 72 hours.    Cancer Center Support Programs:   > Cancer Support Group  2nd Tuesday of the month 1pm-2pm, Journey Room    

## 2021-03-08 NOTE — Progress Notes (Signed)
Okay for treatment per Dr Raliegh Ip.    PT tolerated Beryle Flock well today without incidence.  Pt stable during and after treatment.  Vital signs stable prior to discharge.  AVS reviewed. Discharged in stable condition ambulatory.

## 2021-03-08 NOTE — Patient Instructions (Signed)
[facility]  Discharge Instructions: Thank you for choosing [facility] to provide your oncology and hematology care.  If you have a lab appointment with the Cancer Center, please come in thru the Main Entrance and check in at the main information desk.  Wear comfortable clothing and clothing appropriate for easy access to any Portacath or PICC line.   We strive to give you quality time with your provider. You may need to reschedule your appointment if you arrive late (15 or more minutes).  Arriving late affects you and other patients whose appointments are after yours.  Also, if you miss three or more appointments without notifying the office, you may be dismissed from the clinic at the provider's discretion.      For prescription refill requests, have your pharmacy contact our office and allow 72 hours for refills to be completed.    Today you received the following chemotherapy and/or immunotherapy agents keytruda.       To help prevent nausea and vomiting after your treatment, we encourage you to take your nausea medication as directed.  BELOW ARE SYMPTOMS THAT SHOULD BE REPORTED IMMEDIATELY: *FEVER GREATER THAN 100.4 F (38 C) OR HIGHER *CHILLS OR SWEATING *NAUSEA AND VOMITING THAT IS NOT CONTROLLED WITH YOUR NAUSEA MEDICATION *UNUSUAL SHORTNESS OF BREATH *UNUSUAL BRUISING OR BLEEDING *URINARY PROBLEMS (pain or burning when urinating, or frequent urination) *BOWEL PROBLEMS (unusual diarrhea, constipation, pain near the anus) TENDERNESS IN MOUTH AND THROAT WITH OR WITHOUT PRESENCE OF ULCERS (sore throat, sores in mouth, or a toothache) UNUSUAL RASH, SWELLING OR PAIN  UNUSUAL VAGINAL DISCHARGE OR ITCHING   Items with * indicate a potential emergency and should be followed up as soon as possible or go to the Emergency Department if any problems should occur.  Please show the CHEMOTHERAPY ALERT CARD or IMMUNOTHERAPY ALERT CARD at check-in to the Emergency  Department and triage nurse.  Should you have questions after your visit or need to cancel or reschedule your appointment, please contact [facility] [phone]  and follow the prompts.  Office hours are 8:00 a.m. to 4:30 p.m. Monday - Friday. Please note that voicemails left after 4:00 p.m. may not be returned until the following business day.  We are closed weekends and major holidays. You have access to a nurse at all times for urgent questions. Please call the main number to the clinic [phone] and follow the prompts.  For any non-urgent questions, you may also contact your provider using MyChart. We now offer e-Visits for anyone 18 and older to request care online for non-urgent symptoms. For details visit [email or web address].   Also download the MyChart app! Go to the app store, search "MyChart", open the app, select [place], and log in with your MyChart username and password.  Due to Covid, a mask is required upon entering the hospital/clinic. If you do not have a mask, one will be given to you upon arrival. For doctor visits, patients may have 1 support person aged 18 or older with them. For treatment visits, patients cannot have anyone with them due to current Covid guidelines and our immunocompromised population.  

## 2021-03-16 ENCOUNTER — Ambulatory Visit
Admission: RE | Admit: 2021-03-16 | Discharge: 2021-03-16 | Disposition: A | Discharge status: Home / Self Care | Payer: BC Managed Care – PPO | Source: Ambulatory Visit | Attending: Radiation Oncology | Admitting: Radiation Oncology

## 2021-03-16 ENCOUNTER — Ambulatory Visit: Payer: BC Managed Care – PPO | Admitting: Radiation Oncology

## 2021-03-16 DIAGNOSIS — Z171 Estrogen receptor negative status [ER-]: Secondary | ICD-10-CM | POA: Diagnosis present

## 2021-03-16 DIAGNOSIS — C50812 Malignant neoplasm of overlapping sites of left female breast: Secondary | ICD-10-CM | POA: Insufficient documentation

## 2021-03-17 ENCOUNTER — Other Ambulatory Visit: Payer: Self-pay

## 2021-03-17 ENCOUNTER — Ambulatory Visit: Payer: BC Managed Care – PPO | Admitting: Radiation Oncology

## 2021-03-17 DIAGNOSIS — C50812 Malignant neoplasm of overlapping sites of left female breast: Secondary | ICD-10-CM | POA: Diagnosis not present

## 2021-03-17 NOTE — Progress Notes (Signed)
Pt here for patient teaching.  Pt given Radiation and You booklet, skin care instructions, Alra deodorant and Radiaplex gel.  Reviewed areas of pertinence such as fatigue, hair loss, skin changes, breast tenderness and breast swelling . Pt able to give teach back of to pat skin and use unscented/gentle soap,apply Radiaplex bid, avoid applying anything to skin within 4 hours of treatment, avoid wearing an under wire bra and to use an electric razor if they must shave. Pt verbalizes understanding of information given and will contact nursing with any questions or concerns.     [email or web address]

## 2021-03-18 ENCOUNTER — Ambulatory Visit: Payer: BC Managed Care – PPO | Admitting: Radiation Oncology

## 2021-03-18 ENCOUNTER — Ambulatory Visit
Admission: RE | Admit: 2021-03-18 | Discharge: 2021-03-18 | Disposition: A | Discharge status: Home / Self Care | Payer: BC Managed Care – PPO | Source: Ambulatory Visit | Attending: Radiation Oncology | Admitting: Radiation Oncology

## 2021-03-18 DIAGNOSIS — C50812 Malignant neoplasm of overlapping sites of left female breast: Secondary | ICD-10-CM

## 2021-03-18 DIAGNOSIS — Z171 Estrogen receptor negative status [ER-]: Secondary | ICD-10-CM

## 2021-03-18 MED ORDER — RADIAPLEXRX EX GEL
Freq: Once | CUTANEOUS | Status: AC
Start: 2021-03-18 — End: 2021-03-18

## 2021-03-18 MED ORDER — ALRA NON-METALLIC DEODORANT (RAD-ONC)
1.0000 "application " | Freq: Once | TOPICAL | Status: AC
  Administered 2021-03-18: 1 via TOPICAL

## 2021-03-21 ENCOUNTER — Other Ambulatory Visit: Payer: Self-pay

## 2021-03-21 ENCOUNTER — Ambulatory Visit: Payer: BC Managed Care – PPO | Admitting: Radiation Oncology

## 2021-03-21 ENCOUNTER — Ambulatory Visit
Admission: RE | Admit: 2021-03-21 | Discharge: 2021-03-21 | Disposition: A | Discharge status: Home / Self Care | Payer: BC Managed Care – PPO | Source: Ambulatory Visit | Attending: Radiation Oncology | Admitting: Radiation Oncology

## 2021-03-21 DIAGNOSIS — C50812 Malignant neoplasm of overlapping sites of left female breast: Secondary | ICD-10-CM | POA: Diagnosis not present

## 2021-03-22 ENCOUNTER — Ambulatory Visit: Payer: BC Managed Care – PPO | Admitting: Radiation Oncology

## 2021-03-22 ENCOUNTER — Ambulatory Visit
Admission: RE | Admit: 2021-03-22 | Discharge: 2021-03-22 | Disposition: A | Discharge status: Home / Self Care | Payer: BC Managed Care – PPO | Source: Ambulatory Visit | Attending: Radiation Oncology | Admitting: Radiation Oncology

## 2021-03-22 DIAGNOSIS — C50812 Malignant neoplasm of overlapping sites of left female breast: Secondary | ICD-10-CM | POA: Diagnosis not present

## 2021-03-23 ENCOUNTER — Other Ambulatory Visit: Payer: Self-pay

## 2021-03-23 ENCOUNTER — Ambulatory Visit: Payer: BC Managed Care – PPO | Admitting: Radiation Oncology

## 2021-03-23 ENCOUNTER — Ambulatory Visit
Admission: RE | Admit: 2021-03-23 | Discharge: 2021-03-23 | Disposition: A | Discharge status: Home / Self Care | Payer: BC Managed Care – PPO | Source: Ambulatory Visit | Attending: Radiation Oncology | Admitting: Radiation Oncology

## 2021-03-23 DIAGNOSIS — C50812 Malignant neoplasm of overlapping sites of left female breast: Secondary | ICD-10-CM | POA: Diagnosis not present

## 2021-03-24 ENCOUNTER — Ambulatory Visit: Payer: BC Managed Care – PPO | Admitting: Radiation Oncology

## 2021-03-24 ENCOUNTER — Ambulatory Visit
Admission: RE | Admit: 2021-03-24 | Discharge: 2021-03-24 | Disposition: A | Discharge status: Home / Self Care | Payer: BC Managed Care – PPO | Source: Ambulatory Visit | Attending: Radiation Oncology | Admitting: Radiation Oncology

## 2021-03-24 DIAGNOSIS — C50812 Malignant neoplasm of overlapping sites of left female breast: Secondary | ICD-10-CM | POA: Diagnosis not present

## 2021-03-25 ENCOUNTER — Ambulatory Visit: Payer: BC Managed Care – PPO | Admitting: Radiation Oncology

## 2021-03-25 ENCOUNTER — Ambulatory Visit
Admission: RE | Admit: 2021-03-25 | Discharge: 2021-03-25 | Disposition: A | Discharge status: Home / Self Care | Payer: BC Managed Care – PPO | Source: Ambulatory Visit | Attending: Radiation Oncology | Admitting: Radiation Oncology

## 2021-03-25 ENCOUNTER — Other Ambulatory Visit: Payer: Self-pay

## 2021-03-25 DIAGNOSIS — Z171 Estrogen receptor negative status [ER-]: Secondary | ICD-10-CM

## 2021-03-25 DIAGNOSIS — C50812 Malignant neoplasm of overlapping sites of left female breast: Secondary | ICD-10-CM | POA: Diagnosis not present

## 2021-03-25 MED ORDER — RADIAPLEXRX EX GEL
Freq: Once | CUTANEOUS | Status: AC
Start: 2021-03-25 — End: 2021-03-25

## 2021-03-28 ENCOUNTER — Ambulatory Visit
Admission: RE | Admit: 2021-03-28 | Discharge: 2021-03-28 | Disposition: A | Discharge status: Home / Self Care | Payer: BC Managed Care – PPO | Source: Ambulatory Visit | Attending: Radiation Oncology | Admitting: Radiation Oncology

## 2021-03-28 ENCOUNTER — Ambulatory Visit: Payer: BC Managed Care – PPO | Admitting: Radiation Oncology

## 2021-03-28 DIAGNOSIS — C50812 Malignant neoplasm of overlapping sites of left female breast: Secondary | ICD-10-CM | POA: Diagnosis not present

## 2021-03-29 ENCOUNTER — Ambulatory Visit
Admission: RE | Admit: 2021-03-29 | Discharge: 2021-03-29 | Disposition: A | Discharge status: Home / Self Care | Payer: BC Managed Care – PPO | Source: Ambulatory Visit | Attending: Radiation Oncology | Admitting: Radiation Oncology

## 2021-03-29 ENCOUNTER — Ambulatory Visit: Payer: BC Managed Care – PPO | Admitting: Radiation Oncology

## 2021-03-29 ENCOUNTER — Other Ambulatory Visit: Payer: Self-pay

## 2021-03-29 ENCOUNTER — Encounter (HOSPITAL_COMMUNITY): Payer: Self-pay

## 2021-03-29 ENCOUNTER — Inpatient Hospital Stay (HOSPITAL_COMMUNITY): Payer: BC Managed Care – PPO | Attending: Hematology

## 2021-03-29 ENCOUNTER — Inpatient Hospital Stay (HOSPITAL_COMMUNITY): Payer: BC Managed Care – PPO

## 2021-03-29 VITALS — BP 111/67 | HR 67 | Temp 98.2°F | Resp 18 | Wt 161.7 lb

## 2021-03-29 DIAGNOSIS — Z5112 Encounter for antineoplastic immunotherapy: Secondary | ICD-10-CM | POA: Diagnosis present

## 2021-03-29 DIAGNOSIS — Z171 Estrogen receptor negative status [ER-]: Secondary | ICD-10-CM

## 2021-03-29 DIAGNOSIS — Z79899 Other long term (current) drug therapy: Secondary | ICD-10-CM | POA: Diagnosis not present

## 2021-03-29 DIAGNOSIS — C50812 Malignant neoplasm of overlapping sites of left female breast: Secondary | ICD-10-CM | POA: Diagnosis present

## 2021-03-29 LAB — COMPREHENSIVE METABOLIC PANEL
ALT: 19 U/L (ref 0–44)
AST: 21 U/L (ref 15–41)
Albumin: 3.7 g/dL (ref 3.5–5.0)
Alkaline Phosphatase: 75 U/L (ref 38–126)
Anion gap: 5 (ref 5–15)
BUN: 14 mg/dL (ref 8–23)
CO2: 27 mmol/L (ref 22–32)
Calcium: 9 mg/dL (ref 8.9–10.3)
Chloride: 107 mmol/L (ref 98–111)
Creatinine, Ser: 0.69 mg/dL (ref 0.44–1.00)
GFR, Estimated: >60 mL/min (ref >60)
Glucose, Bld: 121 mg/dL — ABNORMAL HIGH (ref 70–99)
Potassium: 3.6 mmol/L (ref 3.5–5.1)
Sodium: 139 mmol/L (ref 135–145)
Total Bilirubin: 0.6 mg/dL (ref 0.3–1.2)
Total Protein: 6.1 g/dL — ABNORMAL LOW (ref 6.5–8.1)

## 2021-03-29 LAB — CBC WITH DIFFERENTIAL/PLATELET
Abs Immature Granulocytes: 0.01 10*3/uL (ref 0.00–0.07)
Basophils Absolute: 0 10*3/uL (ref 0.0–0.1)
Basophils Relative: 1 %
Eosinophils Absolute: 0.1 10*3/uL (ref 0.0–0.5)
Eosinophils Relative: 3 %
HCT: 37.7 % (ref 36.0–46.0)
Hemoglobin: 12.5 g/dL (ref 12.0–15.0)
Immature Granulocytes: 0 %
Lymphocytes Relative: 38 %
Lymphs Abs: 1.7 10*3/uL (ref 0.7–4.0)
MCH: 31.9 pg (ref 26.0–34.0)
MCHC: 33.2 g/dL (ref 30.0–36.0)
MCV: 96.2 fL (ref 80.0–100.0)
Monocytes Absolute: 0.4 10*3/uL (ref 0.1–1.0)
Monocytes Relative: 8 %
Neutro Abs: 2.3 10*3/uL (ref 1.7–7.7)
Neutrophils Relative %: 50 %
Platelets: 194 10*3/uL (ref 150–400)
RBC: 3.92 MIL/uL (ref 3.87–5.11)
RDW: 13.4 % (ref 11.5–15.5)
WBC: 4.6 10*3/uL (ref 4.0–10.5)
nRBC: 0 % (ref 0.0–0.2)

## 2021-03-29 LAB — TSH: TSH: 1.319 u[IU]/mL (ref 0.350–4.500)

## 2021-03-29 MED ORDER — HEPARIN SOD (PORK) LOCK FLUSH 100 UNIT/ML IV SOLN
500.0000 [IU] | Freq: Once | INTRAVENOUS | Status: AC | PRN
  Administered 2021-03-29: 500 [IU]

## 2021-03-29 MED ORDER — SODIUM CHLORIDE 0.9 % IV SOLN
200.0000 mg | Freq: Once | INTRAVENOUS | Status: AC
  Administered 2021-03-29: 200 mg via INTRAVENOUS
  Filled 2021-03-29: qty 8

## 2021-03-29 MED ORDER — SODIUM CHLORIDE 0.9% FLUSH
10.0000 mL | INTRAVENOUS | Status: DC | PRN
  Administered 2021-03-29: 10 mL

## 2021-03-29 MED ORDER — SODIUM CHLORIDE 0.9 % IV SOLN: Freq: Once | INTRAVENOUS | Status: AC

## 2021-03-29 NOTE — Progress Notes (Signed)
Patient tolerated Keytruda with no complaints voiced. Side effects with management reviewed understanding verbalized. Port site clean and dry with no bruising or swelling noted at site. Good blood return noted before and after administration of chemotherapy. Band aid applied. Patient left in satisfactory condition with VSS and no s/s of distress noted.

## 2021-03-29 NOTE — Patient Instructions (Signed)
[facility]  Discharge Instructions: Thank you for choosing [facility] to provide your oncology and hematology care.  If you have a lab appointment with the Cancer Center, please come in thru the Main Entrance and check in at the main information desk.  Wear comfortable clothing and clothing appropriate for easy access to any Portacath or PICC line.   We strive to give you quality time with your provider. You may need to reschedule your appointment if you arrive late (15 or more minutes).  Arriving late affects you and other patients whose appointments are after yours.  Also, if you miss three or more appointments without notifying the office, you may be dismissed from the clinic at the provider's discretion.      For prescription refill requests, have your pharmacy contact our office and allow 72 hours for refills to be completed.    Today you received the following chemotherapy and/or immunotherapy agents Keytruda       To help prevent nausea and vomiting after your treatment, we encourage you to take your nausea medication as directed.  BELOW ARE SYMPTOMS THAT SHOULD BE REPORTED IMMEDIATELY: *FEVER GREATER THAN 100.4 F (38 C) OR HIGHER *CHILLS OR SWEATING *NAUSEA AND VOMITING THAT IS NOT CONTROLLED WITH YOUR NAUSEA MEDICATION *UNUSUAL SHORTNESS OF BREATH *UNUSUAL BRUISING OR BLEEDING *URINARY PROBLEMS (pain or burning when urinating, or frequent urination) *BOWEL PROBLEMS (unusual diarrhea, constipation, pain near the anus) TENDERNESS IN MOUTH AND THROAT WITH OR WITHOUT PRESENCE OF ULCERS (sore throat, sores in mouth, or a toothache) UNUSUAL RASH, SWELLING OR PAIN  UNUSUAL VAGINAL DISCHARGE OR ITCHING   Items with * indicate a potential emergency and should be followed up as soon as possible or go to the Emergency Department if any problems should occur.  Please show the CHEMOTHERAPY ALERT CARD or IMMUNOTHERAPY ALERT CARD at check-in to the Emergency  Department and triage nurse.  Should you have questions after your visit or need to cancel or reschedule your appointment, please contact [facility] [phone]  and follow the prompts.  Office hours are 8:00 a.m. to 4:30 p.m. Monday - Friday. Please note that voicemails left after 4:00 p.m. may not be returned until the following business day.  We are closed weekends and major holidays. You have access to a nurse at all times for urgent questions. Please call the main number to the clinic [phone] and follow the prompts.  For any non-urgent questions, you may also contact your provider using MyChart. We now offer e-Visits for anyone 18 and older to request care online for non-urgent symptoms. For details visit [email or web address].   Also download the MyChart app! Go to the app store, search "MyChart", open the app, select [place], and log in with your MyChart username and password.  Due to Covid, a mask is required upon entering the hospital/clinic. If you do not have a mask, one will be given to you upon arrival. For doctor visits, patients may have 1 support person aged 18 or older with them. For treatment visits, patients cannot have anyone with them due to current Covid guidelines and our immunocompromised population.  

## 2021-03-30 ENCOUNTER — Ambulatory Visit
Admission: RE | Admit: 2021-03-30 | Discharge: 2021-03-30 | Disposition: A | Discharge status: Home / Self Care | Payer: BC Managed Care – PPO | Source: Ambulatory Visit | Attending: Radiation Oncology | Admitting: Radiation Oncology

## 2021-03-30 ENCOUNTER — Ambulatory Visit: Payer: BC Managed Care – PPO | Admitting: Radiation Oncology

## 2021-03-30 DIAGNOSIS — C50812 Malignant neoplasm of overlapping sites of left female breast: Secondary | ICD-10-CM | POA: Diagnosis not present

## 2021-03-31 ENCOUNTER — Ambulatory Visit: Payer: BC Managed Care – PPO | Admitting: Radiation Oncology

## 2021-03-31 ENCOUNTER — Other Ambulatory Visit: Payer: Self-pay

## 2021-03-31 ENCOUNTER — Ambulatory Visit
Admission: RE | Admit: 2021-03-31 | Discharge: 2021-03-31 | Disposition: A | Discharge status: Home / Self Care | Payer: BC Managed Care – PPO | Source: Ambulatory Visit | Attending: Radiation Oncology | Admitting: Radiation Oncology

## 2021-03-31 DIAGNOSIS — C50812 Malignant neoplasm of overlapping sites of left female breast: Secondary | ICD-10-CM | POA: Diagnosis not present

## 2021-04-01 ENCOUNTER — Ambulatory Visit
Admission: RE | Admit: 2021-04-01 | Discharge: 2021-04-01 | Disposition: A | Discharge status: Home / Self Care | Payer: BC Managed Care – PPO | Source: Ambulatory Visit | Attending: Radiation Oncology | Admitting: Radiation Oncology

## 2021-04-01 ENCOUNTER — Ambulatory Visit: Payer: BC Managed Care – PPO | Admitting: Radiation Oncology

## 2021-04-01 DIAGNOSIS — C50812 Malignant neoplasm of overlapping sites of left female breast: Secondary | ICD-10-CM

## 2021-04-01 DIAGNOSIS — Z171 Estrogen receptor negative status [ER-]: Secondary | ICD-10-CM

## 2021-04-01 MED ORDER — RADIAPLEXRX EX GEL
Freq: Once | CUTANEOUS | Status: AC
Start: 2021-04-01 — End: 2021-04-01

## 2021-04-04 ENCOUNTER — Other Ambulatory Visit: Payer: Self-pay

## 2021-04-04 ENCOUNTER — Ambulatory Visit: Payer: BC Managed Care – PPO | Admitting: Radiation Oncology

## 2021-04-04 ENCOUNTER — Ambulatory Visit
Admission: RE | Admit: 2021-04-04 | Discharge: 2021-04-04 | Disposition: A | Discharge status: Home / Self Care | Payer: BC Managed Care – PPO | Source: Ambulatory Visit | Attending: Radiation Oncology | Admitting: Radiation Oncology

## 2021-04-04 DIAGNOSIS — C50812 Malignant neoplasm of overlapping sites of left female breast: Secondary | ICD-10-CM | POA: Diagnosis not present

## 2021-04-05 ENCOUNTER — Ambulatory Visit: Payer: BC Managed Care – PPO | Admitting: Radiation Oncology

## 2021-04-05 ENCOUNTER — Ambulatory Visit
Admission: RE | Admit: 2021-04-05 | Discharge: 2021-04-05 | Disposition: A | Discharge status: Home / Self Care | Payer: BC Managed Care – PPO | Source: Ambulatory Visit | Attending: Radiation Oncology | Admitting: Radiation Oncology

## 2021-04-05 DIAGNOSIS — C50812 Malignant neoplasm of overlapping sites of left female breast: Secondary | ICD-10-CM | POA: Diagnosis not present

## 2021-04-06 ENCOUNTER — Ambulatory Visit
Admission: RE | Admit: 2021-04-06 | Discharge: 2021-04-06 | Disposition: A | Discharge status: Home / Self Care | Payer: BC Managed Care – PPO | Source: Ambulatory Visit | Attending: Radiation Oncology | Admitting: Radiation Oncology

## 2021-04-06 ENCOUNTER — Other Ambulatory Visit: Payer: Self-pay

## 2021-04-06 ENCOUNTER — Ambulatory Visit: Payer: BC Managed Care – PPO | Admitting: Radiation Oncology

## 2021-04-06 DIAGNOSIS — C50812 Malignant neoplasm of overlapping sites of left female breast: Secondary | ICD-10-CM | POA: Diagnosis not present

## 2021-04-07 ENCOUNTER — Ambulatory Visit
Admission: RE | Admit: 2021-04-07 | Discharge: 2021-04-07 | Disposition: A | Discharge status: Home / Self Care | Payer: BC Managed Care – PPO | Source: Ambulatory Visit | Attending: Radiation Oncology | Admitting: Radiation Oncology

## 2021-04-07 ENCOUNTER — Ambulatory Visit: Payer: BC Managed Care – PPO | Admitting: Radiation Oncology

## 2021-04-07 DIAGNOSIS — C50812 Malignant neoplasm of overlapping sites of left female breast: Secondary | ICD-10-CM | POA: Diagnosis not present

## 2021-04-08 ENCOUNTER — Ambulatory Visit
Admission: RE | Admit: 2021-04-08 | Discharge: 2021-04-08 | Disposition: A | Discharge status: Home / Self Care | Payer: BC Managed Care – PPO | Source: Ambulatory Visit | Attending: Radiation Oncology | Admitting: Radiation Oncology

## 2021-04-08 ENCOUNTER — Ambulatory Visit: Payer: BC Managed Care – PPO | Admitting: Radiation Oncology

## 2021-04-08 ENCOUNTER — Other Ambulatory Visit: Payer: Self-pay

## 2021-04-08 DIAGNOSIS — C50812 Malignant neoplasm of overlapping sites of left female breast: Secondary | ICD-10-CM | POA: Diagnosis not present

## 2021-04-11 ENCOUNTER — Ambulatory Visit: Payer: BC Managed Care – PPO

## 2021-04-11 ENCOUNTER — Ambulatory Visit: Payer: BC Managed Care – PPO | Admitting: Radiation Oncology

## 2021-04-12 ENCOUNTER — Ambulatory Visit: Payer: BC Managed Care – PPO

## 2021-04-12 ENCOUNTER — Ambulatory Visit: Payer: BC Managed Care – PPO | Admitting: Radiation Oncology

## 2021-04-12 ENCOUNTER — Other Ambulatory Visit: Payer: Self-pay

## 2021-04-12 ENCOUNTER — Ambulatory Visit
Admission: RE | Admit: 2021-04-12 | Discharge: 2021-04-12 | Disposition: A | Discharge status: Home / Self Care | Payer: BC Managed Care – PPO | Source: Ambulatory Visit | Attending: Radiation Oncology | Admitting: Radiation Oncology

## 2021-04-12 DIAGNOSIS — C50812 Malignant neoplasm of overlapping sites of left female breast: Secondary | ICD-10-CM | POA: Diagnosis not present

## 2021-04-13 ENCOUNTER — Ambulatory Visit
Admission: RE | Admit: 2021-04-13 | Discharge: 2021-04-13 | Disposition: A | Discharge status: Home / Self Care | Payer: BC Managed Care – PPO | Source: Ambulatory Visit | Attending: Radiation Oncology | Admitting: Radiation Oncology

## 2021-04-13 DIAGNOSIS — C50812 Malignant neoplasm of overlapping sites of left female breast: Secondary | ICD-10-CM | POA: Diagnosis not present

## 2021-04-14 ENCOUNTER — Ambulatory Visit
Admission: RE | Admit: 2021-04-14 | Discharge: 2021-04-14 | Disposition: A | Discharge status: Home / Self Care | Payer: BC Managed Care – PPO | Source: Ambulatory Visit | Attending: Radiation Oncology | Admitting: Radiation Oncology

## 2021-04-14 ENCOUNTER — Encounter: Payer: Self-pay | Admitting: Radiation Oncology

## 2021-04-14 DIAGNOSIS — C50812 Malignant neoplasm of overlapping sites of left female breast: Secondary | ICD-10-CM | POA: Diagnosis not present

## 2021-04-15 ENCOUNTER — Encounter (HOSPITAL_COMMUNITY): Payer: Self-pay | Admitting: Hematology

## 2021-04-15 ENCOUNTER — Telehealth (HOSPITAL_COMMUNITY): Payer: Self-pay | Admitting: *Deleted

## 2021-04-15 ENCOUNTER — Encounter: Payer: Self-pay | Admitting: Hematology and Oncology

## 2021-04-15 NOTE — Telephone Encounter (Signed)
Patient called to advise that she tested positive for COVID on Saturday June 25 th. Has had mild symptoms with no fever.  Keytruda scheduled for next week.  Made her aware that she would need to wait until the week of the 18th for treatment.  Verbalized understanding.

## 2021-04-15 NOTE — Progress Notes (Signed)
                                                                                                                                                             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  Intent: Curative  Radiation Treatment Dates: 03/17/2021 through 04/14/2021 Site Technique Total Dose (Gy) Dose per Fx (Gy) Completed Fx Beam Energies  Breast, Left: Breast_Lt 3D 42.56/42.56 2.66 16/16 6X  Breast, Left: Breast_Lt_Bst 3D 8/8 2 4/4 6X, 10X   Narrative: The patient tolerated radiation therapy relatively well. She did have anticipated skin changes in the treatment area.  Plan: The patient will receive a call in about one month from the radiation oncology department. She will continue follow up with Dr. Delton Coombes as well.   ________________________________________________    Carola Rhine, [facility]

## 2021-04-20 ENCOUNTER — Ambulatory Visit (HOSPITAL_COMMUNITY): Payer: BC Managed Care – PPO | Admitting: Hematology and Oncology

## 2021-04-20 ENCOUNTER — Ambulatory Visit (HOSPITAL_COMMUNITY): Payer: BC Managed Care – PPO

## 2021-04-20 ENCOUNTER — Other Ambulatory Visit (HOSPITAL_COMMUNITY): Payer: BC Managed Care – PPO

## 2021-04-21 ENCOUNTER — Ambulatory Visit (HOSPITAL_COMMUNITY): Payer: BC Managed Care – PPO

## 2021-04-21 ENCOUNTER — Other Ambulatory Visit (HOSPITAL_COMMUNITY): Payer: BC Managed Care – PPO

## 2021-04-27 ENCOUNTER — Other Ambulatory Visit (HOSPITAL_COMMUNITY): Payer: Self-pay | Admitting: Hematology

## 2021-04-28 ENCOUNTER — Encounter: Payer: Self-pay | Admitting: Hematology and Oncology

## 2021-04-28 ENCOUNTER — Encounter (HOSPITAL_COMMUNITY): Payer: Self-pay | Admitting: Hematology

## 2021-05-04 ENCOUNTER — Other Ambulatory Visit: Payer: Self-pay

## 2021-05-04 ENCOUNTER — Inpatient Hospital Stay (HOSPITAL_COMMUNITY): Payer: BC Managed Care – PPO | Attending: Hematology

## 2021-05-04 ENCOUNTER — Inpatient Hospital Stay (HOSPITAL_COMMUNITY): Payer: BC Managed Care – PPO

## 2021-05-04 VITALS — BP 125/71 | HR 65 | Temp 98.1°F | Resp 17 | Wt 161.6 lb

## 2021-05-04 DIAGNOSIS — Z171 Estrogen receptor negative status [ER-]: Secondary | ICD-10-CM

## 2021-05-04 DIAGNOSIS — Z5112 Encounter for antineoplastic immunotherapy: Secondary | ICD-10-CM | POA: Insufficient documentation

## 2021-05-04 DIAGNOSIS — C50812 Malignant neoplasm of overlapping sites of left female breast: Secondary | ICD-10-CM

## 2021-05-04 DIAGNOSIS — Z79899 Other long term (current) drug therapy: Secondary | ICD-10-CM | POA: Insufficient documentation

## 2021-05-04 DIAGNOSIS — T451X5A Adverse effect of antineoplastic and immunosuppressive drugs, initial encounter: Secondary | ICD-10-CM | POA: Diagnosis not present

## 2021-05-04 DIAGNOSIS — D701 Agranulocytosis secondary to cancer chemotherapy: Secondary | ICD-10-CM | POA: Insufficient documentation

## 2021-05-04 LAB — CBC WITH DIFFERENTIAL/PLATELET
Abs Immature Granulocytes: 0.01 10*3/uL (ref 0.00–0.07)
Basophils Absolute: 0 10*3/uL (ref 0.0–0.1)
Basophils Relative: 1 %
Eosinophils Absolute: 0.1 10*3/uL (ref 0.0–0.5)
Eosinophils Relative: 3 %
HCT: 37 % (ref 36.0–46.0)
Hemoglobin: 12.2 g/dL (ref 12.0–15.0)
Immature Granulocytes: 0 %
Lymphocytes Relative: 37 %
Lymphs Abs: 1.6 10*3/uL (ref 0.7–4.0)
MCH: 31.5 pg (ref 26.0–34.0)
MCHC: 33 g/dL (ref 30.0–36.0)
MCV: 95.6 fL (ref 80.0–100.0)
Monocytes Absolute: 0.4 10*3/uL (ref 0.1–1.0)
Monocytes Relative: 10 %
Neutro Abs: 2.2 10*3/uL (ref 1.7–7.7)
Neutrophils Relative %: 49 %
Platelets: 209 10*3/uL (ref 150–400)
RBC: 3.87 MIL/uL (ref 3.87–5.11)
RDW: 14 % (ref 11.5–15.5)
WBC: 4.4 10*3/uL (ref 4.0–10.5)
nRBC: 0 % (ref 0.0–0.2)

## 2021-05-04 LAB — COMPREHENSIVE METABOLIC PANEL
ALT: 20 U/L (ref 0–44)
AST: 22 U/L (ref 15–41)
Albumin: 3.9 g/dL (ref 3.5–5.0)
Alkaline Phosphatase: 82 U/L (ref 38–126)
Anion gap: 10 (ref 5–15)
BUN: 13 mg/dL (ref 8–23)
CO2: 25 mmol/L (ref 22–32)
Calcium: 9 mg/dL (ref 8.9–10.3)
Chloride: 103 mmol/L (ref 98–111)
Creatinine, Ser: 0.62 mg/dL (ref 0.44–1.00)
GFR, Estimated: >60 mL/min (ref >60)
Glucose, Bld: 89 mg/dL (ref 70–99)
Potassium: 3.6 mmol/L (ref 3.5–5.1)
Sodium: 138 mmol/L (ref 135–145)
Total Bilirubin: 0.6 mg/dL (ref 0.3–1.2)
Total Protein: 6.5 g/dL (ref 6.5–8.1)

## 2021-05-04 LAB — TSH: TSH: 1.407 u[IU]/mL (ref 0.350–4.500)

## 2021-05-04 MED ORDER — SODIUM CHLORIDE 0.9 % IV SOLN
200.0000 mg | Freq: Once | INTRAVENOUS | Status: AC
  Administered 2021-05-04: 200 mg via INTRAVENOUS
  Filled 2021-05-04: qty 8

## 2021-05-04 MED ORDER — SODIUM CHLORIDE 0.9% FLUSH
10.0000 mL | INTRAVENOUS | Status: DC | PRN
  Administered 2021-05-04: 10 mL

## 2021-05-04 MED ORDER — SODIUM CHLORIDE 0.9 % IV SOLN: Freq: Once | INTRAVENOUS | Status: AC

## 2021-05-04 MED ORDER — HEPARIN SOD (PORK) LOCK FLUSH 100 UNIT/ML IV SOLN
500.0000 [IU] | Freq: Once | INTRAVENOUS | Status: AC | PRN
  Administered 2021-05-04: 500 [IU]

## 2021-05-04 NOTE — Progress Notes (Signed)
Pt here today for Keytruda per provider's order. Labs WNL okay for treatment per Dr.K.  Beryle Flock given today per MD orders. Tolerated infusion without adverse affects. Vital signs stable. No complaints at this time. Discharged from clinic ambulatory in stable condition. Alert and oriented x 3. F/U with [facility] as scheduled.

## 2021-05-04 NOTE — Patient Instructions (Signed)
[facility]  Discharge Instructions: Thank you for choosing [facility] to provide your oncology and hematology care.  If you have a lab appointment with the Cancer Center, please come in thru the Main Entrance and check in at the main information desk.  Wear comfortable clothing and clothing appropriate for easy access to any Portacath or PICC line.   We strive to give you quality time with your provider. You may need to reschedule your appointment if you arrive late (15 or more minutes).  Arriving late affects you and other patients whose appointments are after yours.  Also, if you miss three or more appointments without notifying the office, you may be dismissed from the clinic at the provider's discretion.      For prescription refill requests, have your pharmacy contact our office and allow 72 hours for refills to be completed.    Today you received the following chemotherapy and/or immunotherapy agents Keytruda       To help prevent nausea and vomiting after your treatment, we encourage you to take your nausea medication as directed.  BELOW ARE SYMPTOMS THAT SHOULD BE REPORTED IMMEDIATELY: *FEVER GREATER THAN 100.4 F (38 C) OR HIGHER *CHILLS OR SWEATING *NAUSEA AND VOMITING THAT IS NOT CONTROLLED WITH YOUR NAUSEA MEDICATION *UNUSUAL SHORTNESS OF BREATH *UNUSUAL BRUISING OR BLEEDING *URINARY PROBLEMS (pain or burning when urinating, or frequent urination) *BOWEL PROBLEMS (unusual diarrhea, constipation, pain near the anus) TENDERNESS IN MOUTH AND THROAT WITH OR WITHOUT PRESENCE OF ULCERS (sore throat, sores in mouth, or a toothache) UNUSUAL RASH, SWELLING OR PAIN  UNUSUAL VAGINAL DISCHARGE OR ITCHING   Items with * indicate a potential emergency and should be followed up as soon as possible or go to the Emergency Department if any problems should occur.  Please show the CHEMOTHERAPY ALERT CARD or IMMUNOTHERAPY ALERT CARD at check-in to the Emergency  Department and triage nurse.  Should you have questions after your visit or need to cancel or reschedule your appointment, please contact  CANCER CENTER [phone]  and follow the prompts.  Office hours are 8:00 a.m. to 4:30 p.m. Monday - Friday. Please note that voicemails left after 4:00 p.m. may not be returned until the following business day.  We are closed weekends and major holidays. You have access to a nurse at all times for urgent questions. Please call the main number to the clinic [phone] and follow the prompts.  For any non-urgent questions, you may also contact your provider using MyChart. We now offer e-Visits for anyone 18 and older to request care online for non-urgent symptoms. For details visit [email or web address].   Also download the MyChart app! Go to the app store, search "MyChart", open the app, select [place], and log in with your MyChart username and password.  Due to Covid, a mask is required upon entering the hospital/clinic. If you do not have a mask, one will be given to you upon arrival. For doctor visits, patients may have 1 support person aged 18 or older with them. For treatment visits, patients cannot have anyone with them due to current Covid guidelines and our immunocompromised population.  

## 2021-05-04 NOTE — Progress Notes (Signed)
Patients port flushed without difficulty.  Good blood return noted with no bruising or swelling noted at site.  Stable during access and blood draw.  Patient to remain accessed for treatment. 

## 2021-05-09 ENCOUNTER — Encounter (HOSPITAL_COMMUNITY): Payer: Self-pay

## 2021-05-09 ENCOUNTER — Encounter (HOSPITAL_COMMUNITY): Payer: Self-pay | Admitting: *Deleted

## 2021-05-09 NOTE — Progress Notes (Signed)
Patient called today regarding how to plan cataract surgery while receiving Keytruda every 3 weeks. Patient reports that she is going today for the work-up for cataract surgery, she tells me that the surgeries will need to be done by 3 months from today at the very latest. I advise patient to proceed with scheduling cataract surgeries and let us know the exact dates of surgery, Dr. Delton Coombes can then recommend if there will be a need to hold a dose of Keytruda. Patient verbalized understanding and will let us, or have the eye doctor let us know when the surgeries are scheduled.

## 2021-05-12 ENCOUNTER — Ambulatory Visit (HOSPITAL_COMMUNITY): Payer: BC Managed Care – PPO | Admitting: Hematology

## 2021-05-12 ENCOUNTER — Other Ambulatory Visit (HOSPITAL_COMMUNITY): Payer: BC Managed Care – PPO

## 2021-05-12 ENCOUNTER — Ambulatory Visit (HOSPITAL_COMMUNITY): Payer: BC Managed Care – PPO

## 2021-05-12 NOTE — Progress Notes (Signed)
  Radiation Oncology         [phone] ________________________________  Name: Jo Mills MRN: [id number]  Date of Service: 05/23/2021  DOB: 1951-02-06  Post Treatment Telephone Note  Diagnosis:   Stage IIB, cT2N0M0 grade 3, triple negative invasive ductal carcinoma of the left breast with complete response from neoadjuvant chemotherapy.  Interval Since Last Radiation:  6 weeks   03/17/2021 through 04/14/2021 Site Technique Total Dose (Gy) Dose per Fx (Gy) Completed Fx Beam Energies  Breast, Left: Breast_Lt 3D 42.56/42.56 2.66 16/16 6X  Breast, Left: Breast_Lt_Bst 3D 8/8 2 4/4 6X, 10X    Narrative:  The patient was contacted today for routine follow-up. During treatment she did very well with radiotherapy and did not have significant desquamation.    Impression/Plan: 1. Stage IIB, cT2N0M0 grade 3, triple negative invasive ductal carcinoma of the left breast with complete response from neoadjuvant chemotherapy. I was unable to reach the patient but left a message for her and I discussed that we would be happy to continue to follow her as needed, but she will also continue to follow up with Dr. Delton Coombes in medical oncology. She was counseled on skin care as well as measures to avoid sun exposure to this area.       Carola Rhine, PAC

## 2021-05-17 ENCOUNTER — Ambulatory Visit (HOSPITAL_COMMUNITY): Payer: BC Managed Care – PPO | Admitting: Hematology

## 2021-05-17 ENCOUNTER — Ambulatory Visit (HOSPITAL_COMMUNITY): Payer: BC Managed Care – PPO

## 2021-05-17 ENCOUNTER — Other Ambulatory Visit (HOSPITAL_COMMUNITY): Payer: BC Managed Care – PPO

## 2021-05-23 ENCOUNTER — Other Ambulatory Visit: Payer: Self-pay

## 2021-05-23 ENCOUNTER — Ambulatory Visit
Admission: RE | Admit: 2021-05-23 | Discharge: 2021-05-23 | Disposition: A | Discharge status: Home / Self Care | Payer: BC Managed Care – PPO | Source: Ambulatory Visit | Attending: Hematology and Oncology | Admitting: Hematology and Oncology

## 2021-05-23 DIAGNOSIS — C50812 Malignant neoplasm of overlapping sites of left female breast: Secondary | ICD-10-CM

## 2021-05-23 DIAGNOSIS — Z171 Estrogen receptor negative status [ER-]: Secondary | ICD-10-CM

## 2021-05-25 NOTE — Progress Notes (Signed)
[facility] [address], [place] [address]   Patient Care Team: Roderic Scarce, MD as PCP - General (Nurse Practitioner) Rockwell Germany, RN as Oncology Nurse Navigator Mauro Kaufmann, RN as Oncology Nurse Navigator Brien Mates, RN as Oncology Nurse Navigator (Oncology)  SUMMARY OF ONCOLOGIC HISTORY: Oncology History  Malignant neoplasm of overlapping sites of left breast in female, estrogen receptor negative ([facility])  05/17/2020 Cancer Staging   Staging form: Breast, AJCC 8th Edition - Clinical stage from 05/17/2020: Stage IIB (cT2, cN0, cM0, G3, ER-, PR-, HER2-) - Signed by Gardenia Phlegm, NP on 05/26/2020    05/26/2020 Initial Diagnosis   Patient palpated a left breast mass for 5 months. Mammogram and US showed a 2.7cm mass at the 10:30 position, one left axillary lymph node with cortical thickening, calcifications in the lower inner left breast, and a 0.4cm group of calcifications in the right breast. Biopsy on 05/14/20 showed no malignancy in the right breast, and in the left breast, DCIS, high grade, ER/PR negative. 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    06/16/2020 -  Chemotherapy    Patient is on Treatment Plan: BREAST AC Q21D / CARBOPLATIN D1 + PACLITAXEL D1,8,15 Q21D       09/22/2020 Genetic Testing   Negative genetic testing. No pathogenic variants identified on the [facility] Common Hereditary Cancers Panel. The report date is 09/22/2020.  The Common Hereditary Cancers Panel offered by [facility] includes sequencing and/or deletion duplication testing of the following 48 genes: APC, ATM, AXIN2, BARD1, BMPR1A, BRCA1, BRCA2, BRIP1, CDH1, CDKN2A (p14ARF), CDKN2A (p16INK4a), CKD4, CHEK2, CTNNA1, DICER1, EPCAM (Deletion/duplication testing only), GREM1 (promoter region deletion/duplication testing only), KIT, MEN1,  MLH1, MSH2, MSH3, MSH6, MUTYH, NBN, NF1, NHTL1, PALB2, PDGFRA, PMS2, POLD1, POLE, PTEN, RAD50, RAD51C, RAD51D, RNF43, SDHB, SDHC, SDHD, SMAD4, SMARCA4. STK11, TP53, TSC1, TSC2, and VHL.  The following genes were evaluated for sequence changes only: SDHA and HOXB13 c.251G>A variant only.     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 is a 70 y.o. female here today for follow up of her triple negative left breast cancer. Her last visit was on 03/08/21.   Today she is due for cycle 13 of Keytruda.  She reports feeling good today. She reports neuropathy in her toes and a constant metallic taste in her mouth, but she denies SOB, leg swellings, skin rash, and diarrhea.  Overall, she feels ready for her next cycle of chemotherapy today.   REVIEW OF SYSTEMS:   Review of Systems  Constitutional:  Negative for appetite change and fatigue.  Respiratory:  Negative for shortness of breath.   Cardiovascular:  Negative for leg swelling.  Gastrointestinal:  Negative for diarrhea.  Skin:  Negative for rash.  Neurological:  Positive for numbness (feet).  All other systems reviewed and are negative.  I have reviewed the past medical history, past surgical history, social history and family history with the patient and they are unchanged from previous note.   ALLERGIES:   is allergic to grapefruit extract, pseudoephedrine hcl, nickel, pineapple, and amoxicillin.   MEDICATIONS:  Current Outpatient Medications  Medication Sig Dispense Refill   amLODipine-benazepril (LOTREL) 5-20 MG capsule Take 1 capsule by mouth daily.     Bismuth Subsalicylate (PEPTO-BISMOL PO) Take by mouth as needed.  fexofenadine (ALLEGRA) 180 MG tablet Take 180 mg by mouth daily.     fluconazole (DIFLUCAN) 100 MG tablet Take 1 tablet (100 mg total) by mouth daily. 7 tablet 0   fluticasone (FLONASE) 50 MCG/ACT nasal spray Place into both nostrils daily.      ketoconazole (NIZORAL) 2 % cream Apply 1 application topically daily. 30 g 1   Lactobacillus-Inulin (CULTURELLE DIGESTIVE DAILY) CAPS Take 1 capsule by mouth daily.     levothyroxine (SYNTHROID) 100 MCG tablet TAKE 1 TABLET BY MOUTH ONCE DAILY BEFORE BREAKFAST 30 tablet 0   lidocaine-prilocaine (EMLA) cream      LORazepam (ATIVAN) 0.5 MG tablet Take 1 tablet (0.5 mg total) by mouth at bedtime as needed for sleep. 30 tablet 0   magic mouthwash SOLN Take 5 mLs by mouth 3 (three) times daily as needed for mouth pain. 450 mL 0   omeprazole (PRILOSEC) 20 MG capsule Take 20 mg by mouth daily.     ondansetron (ZOFRAN) 8 MG tablet Take 1 tablet (8 mg total) by mouth 2 (two) times daily as needed. Start on the third day after chemotherapy. 30 tablet 1   prochlorperazine (COMPAZINE) 10 MG tablet Take 1 tablet (10 mg total) by mouth every 6 (six) hours as needed (Nausea or vomiting). 30 tablet 1   No current facility-administered medications for this visit.   Facility-Administered Medications Ordered in Other Visits  Medication Dose Route Frequency Provider Last Rate Last Admin   sodium chloride flush (NS) 0.9 % injection 10 mL  10 mL Intracatheter PRN Ladell Pier, MD         PHYSICAL EXAMINATION: Performance status (ECOG): 1 - Symptomatic but completely ambulatory  There were no vitals filed for this visit. Wt Readings from Last 3 Encounters:  05/04/21 161 lb 9.6 oz (73.3 kg)  03/29/21 161 lb 11.2 oz (73.3 kg)  03/08/21 161 lb (73 kg)   Physical Exam Vitals reviewed.  Constitutional:      Appearance: Normal appearance.  Cardiovascular:     Rate and Rhythm: Normal rate and regular rhythm.     Pulses: Normal pulses.     Heart sounds: Normal heart sounds.  Pulmonary:     Effort: Pulmonary effort is normal.     Breath sounds: Normal breath sounds.  Musculoskeletal:     Right lower leg: No edema.     Left lower leg: No edema.  Neurological:     General: No focal deficit present.      Mental Status: She is alert and oriented to person, place, and time.  Psychiatric:        Mood and Affect: Mood normal.        Behavior: Behavior normal.    Breast Exam Chaperone: Thana Ates     LABORATORY DATA:  I have reviewed the data as listed CMP Latest Ref Rng & Units 05/04/2021 03/29/2021 03/08/2021  Glucose 70 - 99 mg/dL 89 121(H) 157(H)  BUN 8 - 23 mg/dL _0 Creatinine 0.44 - 1.00 mg/dL 0.62 0.69 0.80  Sodium 135 - 145 mmol/L 138 139 137  Potassium 3.5 - 5.1 mmol/L 3.6 3.6 3.8  Chloride 98 - 111 mmol/L 103 107 104  CO2 22 - 32 mmol/L _1 Calcium 8.9 - 10.3 mg/dL 9.0 9.0 9.0  Total Protein 6.5 - 8.1 g/dL 6.5 6.1(L) 6.4(L)  Total Bilirubin 0.3 - 1.2 mg/dL 0.6 0.6 0.7  Alkaline Phos 38 - 126 U/L 82 75 90  AST  15 - 41 U/L _0 ALT 0 - 44 U/L _1 No results found for: BOF751 Lab Results  Component Value Date   WBC 4.4 05/04/2021   HGB 12.2 05/04/2021   HCT 37.0 05/04/2021   MCV 95.6 05/04/2021   PLT 209 05/04/2021   NEUTROABS 2.2 05/04/2021    ASSESSMENT:  1.  Stage II (T2N0) left breast TNBC: -Felt left breast mass for 5 months. -Mammogram on 05/07/2020 with suspicious mass in the 10:30 o'clock position in the left breast.  Suspicious calcifications in the anterior lower inner quadrant of the left breast. -MRI on 06/05/2020 shows left breast mass measuring 3.8 x 3.3 x 3.2 cm in the upper inner quadrant.  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  No suspicious lymphadenopathy.  No MRI evidence of malignancy on the right. -Left breast upper inner quadrant biopsy on 05/14/2020 showed DCIS with calcifications.  Right breast biopsy shows fibroadenomatoid nodule with calcifications.  ER/PR was negative. -Left breast core needle biopsy at 10:30 position shows invasive ductal carcinoma.  ER/PR/HER-2 was negative.  Ki-67 85%.  Left lymph node biopsy was reactive. -Patient was evaluated by Dr. [name] and Dr. Georgette Dover. -She  received first cycle of chemotherapy with Adriamycin and cyclophosphamide with pembrolizumab (keynote-522 trial) added for improved PCR by about 15%. -Cycle 1 of Adriamycin, cyclophosphamide and pembrolizumab on 06/16/2020. - Left lumpectomy and SLNB on 01/12/2021, YPT0, ypN0.   2.  Family history: -Paternal cousin and second cousin had breast cancer.  Paternal grandmother also had some type of cancer. -Another paternal cousin died of pancreatic cancer.  Another paternal cousin had leukemia.     PLAN:  1.  Triple negative left breast cancer: -Adjuvant Keytruda was started back on 02/15/2021. - She completed radiation therapy to the left breast and chest wall. - She has tolerated Keytruda without any skin rashes.  No other immunotherapy related side effects. - Reviewed labs today which showed normal LFTs and renal function.  CBC was grossly normal.  TSH was 0.7. - She will proceed with Keytruda today and in 3 weeks.  She will complete a total of 9 cycles of Keytruda in the adjuvant setting. - RTC 9 weeks for follow-up with labs and treatment.   2.  Family history: -Germline mutation testing was negative for BRCA1/2.   3.  Skin rash: -She developed skin rash when she was receiving chemoimmunotherapy prior to surgery. - She has not had any skin rash since Keytruda was restarted.  Skin rash was from paclitaxel.   4.  High risk drug monitoring: -Continue Synthroid 100 mcg daily.  Today TSH is 0.784.  We will continue to closely monitor.   5.  Neuropathy: -Mild numbness in the toes is stable.  Numbness in the fingertips has improved.  No neuropathic pains at this time.  Breast Cancer therapy associated bone loss: I have recommended calcium, Vitamin D and weight bearing exercises.  Orders placed this encounter:  No orders of the defined types were placed in this encounter.   The patient has a good understanding of the overall plan. She agrees with it. She will call with any problems that  may develop before the next visit here.  Derek Jack, MD [facility] [phone]   I, Thana Ates, am acting as a scribe for Dr. Derek Jack.  I, Derek Jack MD, have reviewed the above documentation for accuracy and completeness, and I agree with the above.

## 2021-05-26 ENCOUNTER — Inpatient Hospital Stay (HOSPITAL_COMMUNITY): Payer: BC Managed Care – PPO | Attending: Hematology

## 2021-05-26 ENCOUNTER — Other Ambulatory Visit: Payer: Self-pay

## 2021-05-26 ENCOUNTER — Inpatient Hospital Stay (HOSPITAL_BASED_OUTPATIENT_CLINIC_OR_DEPARTMENT_OTHER): Payer: BC Managed Care – PPO | Admitting: Hematology

## 2021-05-26 ENCOUNTER — Encounter (HOSPITAL_COMMUNITY): Payer: Self-pay | Admitting: Hematology

## 2021-05-26 ENCOUNTER — Inpatient Hospital Stay (HOSPITAL_COMMUNITY): Payer: BC Managed Care – PPO

## 2021-05-26 ENCOUNTER — Other Ambulatory Visit (HOSPITAL_COMMUNITY): Payer: Self-pay | Admitting: *Deleted

## 2021-05-26 VITALS — BP 109/67 | HR 69 | Temp 97.8°F | Resp 18

## 2021-05-26 DIAGNOSIS — Z880 Allergy status to penicillin: Secondary | ICD-10-CM | POA: Diagnosis not present

## 2021-05-26 DIAGNOSIS — Z5112 Encounter for antineoplastic immunotherapy: Secondary | ICD-10-CM | POA: Insufficient documentation

## 2021-05-26 DIAGNOSIS — G629 Polyneuropathy, unspecified: Secondary | ICD-10-CM | POA: Diagnosis not present

## 2021-05-26 DIAGNOSIS — N6314 Unspecified lump in the right breast, lower inner quadrant: Secondary | ICD-10-CM | POA: Diagnosis not present

## 2021-05-26 DIAGNOSIS — Z171 Estrogen receptor negative status [ER-]: Secondary | ICD-10-CM

## 2021-05-26 DIAGNOSIS — R200 Anesthesia of skin: Secondary | ICD-10-CM | POA: Insufficient documentation

## 2021-05-26 DIAGNOSIS — Z923 Personal history of irradiation: Secondary | ICD-10-CM | POA: Diagnosis not present

## 2021-05-26 DIAGNOSIS — Z803 Family history of malignant neoplasm of breast: Secondary | ICD-10-CM | POA: Insufficient documentation

## 2021-05-26 DIAGNOSIS — N6311 Unspecified lump in the right breast, upper outer quadrant: Secondary | ICD-10-CM | POA: Diagnosis not present

## 2021-05-26 DIAGNOSIS — Z79899 Other long term (current) drug therapy: Secondary | ICD-10-CM | POA: Insufficient documentation

## 2021-05-26 DIAGNOSIS — C50812 Malignant neoplasm of overlapping sites of left female breast: Secondary | ICD-10-CM

## 2021-05-26 LAB — COMPREHENSIVE METABOLIC PANEL
ALT: 19 U/L (ref 0–44)
AST: 22 U/L (ref 15–41)
Albumin: 4 g/dL (ref 3.5–5.0)
Alkaline Phosphatase: 81 U/L (ref 38–126)
Anion gap: 5 (ref 5–15)
BUN: 11 mg/dL (ref 8–23)
CO2: 25 mmol/L (ref 22–32)
Calcium: 9 mg/dL (ref 8.9–10.3)
Chloride: 106 mmol/L (ref 98–111)
Creatinine, Ser: 0.6 mg/dL (ref 0.44–1.00)
GFR, Estimated: >60 mL/min (ref >60)
Glucose, Bld: 89 mg/dL (ref 70–99)
Potassium: 3.6 mmol/L (ref 3.5–5.1)
Sodium: 136 mmol/L (ref 135–145)
Total Bilirubin: 0.7 mg/dL (ref 0.3–1.2)
Total Protein: 6.5 g/dL (ref 6.5–8.1)

## 2021-05-26 LAB — CBC WITH DIFFERENTIAL/PLATELET
Abs Immature Granulocytes: 0.01 10*3/uL (ref 0.00–0.07)
Basophils Absolute: 0 10*3/uL (ref 0.0–0.1)
Basophils Relative: 1 %
Eosinophils Absolute: 0.2 10*3/uL (ref 0.0–0.5)
Eosinophils Relative: 3 %
HCT: 37.4 % (ref 36.0–46.0)
Hemoglobin: 12.6 g/dL (ref 12.0–15.0)
Immature Granulocytes: 0 %
Lymphocytes Relative: 32 %
Lymphs Abs: 1.5 10*3/uL (ref 0.7–4.0)
MCH: 31.9 pg (ref 26.0–34.0)
MCHC: 33.7 g/dL (ref 30.0–36.0)
MCV: 94.7 fL (ref 80.0–100.0)
Monocytes Absolute: 0.3 10*3/uL (ref 0.1–1.0)
Monocytes Relative: 7 %
Neutro Abs: 2.7 10*3/uL (ref 1.7–7.7)
Neutrophils Relative %: 57 %
Platelets: 217 10*3/uL (ref 150–400)
RBC: 3.95 MIL/uL (ref 3.87–5.11)
RDW: 13.7 % (ref 11.5–15.5)
WBC: 4.8 10*3/uL (ref 4.0–10.5)
nRBC: 0 % (ref 0.0–0.2)

## 2021-05-26 LAB — TSH: TSH: 0.784 u[IU]/mL (ref 0.350–4.500)

## 2021-05-26 MED ORDER — SODIUM CHLORIDE 0.9% FLUSH
10.0000 mL | INTRAVENOUS | Status: DC | PRN
Start: 2021-05-26 — End: 2021-05-26
  Administered 2021-05-26: 10 mL

## 2021-05-26 MED ORDER — SODIUM CHLORIDE 0.9 % IV SOLN: Freq: Once | INTRAVENOUS | Status: AC

## 2021-05-26 MED ORDER — LEVOTHYROXINE SODIUM 100 MCG PO TABS: 100.0000 ug | ORAL_TABLET | Freq: Every day | ORAL | 3 refills | Status: DC

## 2021-05-26 MED ORDER — HEPARIN SOD (PORK) LOCK FLUSH 100 UNIT/ML IV SOLN
500.0000 [IU] | Freq: Once | INTRAVENOUS | Status: AC | PRN
  Administered 2021-05-26: 500 [IU]

## 2021-05-26 MED ORDER — SODIUM CHLORIDE 0.9 % IV SOLN
200.0000 mg | Freq: Once | INTRAVENOUS | Status: AC
  Administered 2021-05-26: 200 mg via INTRAVENOUS
  Filled 2021-05-26: qty 8

## 2021-05-26 NOTE — Progress Notes (Signed)
Patient has been assessed, vital signs and labs have been reviewed by Dr. Katragadda. ANC, Creatinine, LFTs, and Platelets are within treatment parameters per Dr. Katragadda. The patient is good to proceed with treatment at this time. Primary RN and pharmacy aware.  

## 2021-05-26 NOTE — Patient Instructions (Addendum)
[place] at [facility] Discharge Instructions  You were seen today by Dr. Delton Coombes. He went over your recent results, and you received your treatment. Dr. Delton Coombes will see you back in 9 weeks for labs and follow up.   Thank you for choosing [place] at [facility] to provide your oncology and hematology care.  To afford each patient quality time with our provider, please arrive at least 15 minutes before your scheduled appointment time.   If you have a lab appointment with the [facility] please come in thru the Main Entrance and check in at the main information desk  You need to re-schedule your appointment should you arrive 10 or more minutes late.  We strive to give you quality time with our providers, and arriving late affects you and other patients whose appointments are after yours.  Also, if you no show three or more times for appointments you may be dismissed from the clinic at the providers discretion.     Again, thank you for choosing [facility].  Our hope is that these requests will decrease the amount of time that you wait before being seen by our physicians.       _____________________________________________________________  Should you have questions after your visit to [facility], please contact our office at [phone] between the hours of 8:00 a.m. and 4:30 p.m.  Voicemails left after 4:00 p.m. will not be returned until the following business day.  For prescription refill requests, have your pharmacy contact our office and allow 72 hours.    Cancer Center Support Programs:   > Cancer Support Group  2nd Tuesday of the month 1pm-2pm, Journey Room

## 2021-05-26 NOTE — Patient Instructions (Signed)
[facility]  Discharge Instructions: Thank you for choosing [facility] to provide your oncology and hematology care.  If you have a lab appointment with the Cancer Center, please come in thru the Main Entrance and check in at the main information desk.  Wear comfortable clothing and clothing appropriate for easy access to any Portacath or PICC line.   We strive to give you quality time with your provider. You may need to reschedule your appointment if you arrive late (15 or more minutes).  Arriving late affects you and other patients whose appointments are after yours.  Also, if you miss three or more appointments without notifying the office, you may be dismissed from the clinic at the provider's discretion.      For prescription refill requests, have your pharmacy contact our office and allow 72 hours for refills to be completed.     To help prevent nausea and vomiting after your treatment, we encourage you to take your nausea medication as directed.  BELOW ARE SYMPTOMS THAT SHOULD BE REPORTED IMMEDIATELY: *FEVER GREATER THAN 100.4 F (38 C) OR HIGHER *CHILLS OR SWEATING *NAUSEA AND VOMITING THAT IS NOT CONTROLLED WITH YOUR NAUSEA MEDICATION *UNUSUAL SHORTNESS OF BREATH *UNUSUAL BRUISING OR BLEEDING *URINARY PROBLEMS (pain or burning when urinating, or frequent urination) *BOWEL PROBLEMS (unusual diarrhea, constipation, pain near the anus) TENDERNESS IN MOUTH AND THROAT WITH OR WITHOUT PRESENCE OF ULCERS (sore throat, sores in mouth, or a toothache) UNUSUAL RASH, SWELLING OR PAIN  UNUSUAL VAGINAL DISCHARGE OR ITCHING   Items with * indicate a potential emergency and should be followed up as soon as possible or go to the Emergency Department if any problems should occur.  Should you have questions after your visit or need to cancel or reschedule your appointment, please contact [facility] [phone]  and follow the prompts.  Office hours are 8:00  a.m. to 4:30 p.m. Monday - Friday. Please note that voicemails left after 4:00 p.m. may not be returned until the following business day.  We are closed weekends and major holidays. You have access to a nurse at all times for urgent questions. Please call the main number to the clinic [phone] and follow the prompts.  For any non-urgent questions, you may also contact your provider using MyChart. We now offer e-Visits for anyone 18 and older to request care online for non-urgent symptoms. For details visit [email or web address].   Also download the MyChart app! Go to the app store, search "MyChart", open the app, select [facility], and log in with your MyChart username and password.  Due to Covid, a mask is required upon entering the hospital/clinic. If you do not have a mask, one will be given to you upon arrival. For doctor visits, patients may have 1 support person aged 18 or older with them. For treatment visits, patients cannot have anyone with them due to current Covid guidelines and our immunocompromised population.  

## 2021-05-26 NOTE — Progress Notes (Signed)
Patient presents today for Keytruda infusion.  Labs reviewed by Dr. Delton Coombes during office visit.  Ok to proceed with treatment per Dr. Delton Coombes.  Patient tolerated treatment well with no complaints voiced.  Patient left ambulatory in stable condition.  Vital signs stable at discharge.  Follow up as scheduled.

## 2021-05-29 ENCOUNTER — Encounter (HOSPITAL_COMMUNITY): Payer: Self-pay | Admitting: Hematology

## 2021-05-29 ENCOUNTER — Encounter: Payer: Self-pay | Admitting: Hematology and Oncology

## 2021-06-16 ENCOUNTER — Other Ambulatory Visit: Payer: Self-pay

## 2021-06-16 ENCOUNTER — Inpatient Hospital Stay (HOSPITAL_COMMUNITY): Payer: BC Managed Care – PPO

## 2021-06-16 ENCOUNTER — Ambulatory Visit (HOSPITAL_COMMUNITY): Payer: BC Managed Care – PPO

## 2021-06-16 ENCOUNTER — Other Ambulatory Visit (HOSPITAL_COMMUNITY): Payer: BC Managed Care – PPO

## 2021-06-16 ENCOUNTER — Other Ambulatory Visit (HOSPITAL_COMMUNITY): Payer: Self-pay | Admitting: *Deleted

## 2021-06-16 ENCOUNTER — Inpatient Hospital Stay (HOSPITAL_COMMUNITY): Payer: BC Managed Care – PPO | Attending: Hematology

## 2021-06-16 VITALS — BP 133/71 | HR 61 | Temp 96.7°F | Resp 18 | Wt 163.2 lb

## 2021-06-16 DIAGNOSIS — G629 Polyneuropathy, unspecified: Secondary | ICD-10-CM | POA: Insufficient documentation

## 2021-06-16 DIAGNOSIS — Z171 Estrogen receptor negative status [ER-]: Secondary | ICD-10-CM

## 2021-06-16 DIAGNOSIS — C50812 Malignant neoplasm of overlapping sites of left female breast: Secondary | ICD-10-CM

## 2021-06-16 DIAGNOSIS — Z5112 Encounter for antineoplastic immunotherapy: Secondary | ICD-10-CM | POA: Diagnosis present

## 2021-06-16 DIAGNOSIS — Z79899 Other long term (current) drug therapy: Secondary | ICD-10-CM | POA: Insufficient documentation

## 2021-06-16 DIAGNOSIS — R21 Rash and other nonspecific skin eruption: Secondary | ICD-10-CM | POA: Insufficient documentation

## 2021-06-16 DIAGNOSIS — R200 Anesthesia of skin: Secondary | ICD-10-CM | POA: Insufficient documentation

## 2021-06-16 LAB — COMPREHENSIVE METABOLIC PANEL
ALT: 18 U/L (ref 0–44)
AST: 21 U/L (ref 15–41)
Albumin: 3.9 g/dL (ref 3.5–5.0)
Alkaline Phosphatase: 74 U/L (ref 38–126)
Anion gap: 2 — ABNORMAL LOW (ref 5–15)
BUN: 13 mg/dL (ref 8–23)
CO2: 27 mmol/L (ref 22–32)
Calcium: 8.8 mg/dL — ABNORMAL LOW (ref 8.9–10.3)
Chloride: 106 mmol/L (ref 98–111)
Creatinine, Ser: 0.62 mg/dL (ref 0.44–1.00)
GFR, Estimated: >60 mL/min (ref >60)
Glucose, Bld: 98 mg/dL (ref 70–99)
Potassium: 3.7 mmol/L (ref 3.5–5.1)
Sodium: 135 mmol/L (ref 135–145)
Total Bilirubin: 0.5 mg/dL (ref 0.3–1.2)
Total Protein: 6.6 g/dL (ref 6.5–8.1)

## 2021-06-16 LAB — CBC WITH DIFFERENTIAL/PLATELET
Abs Immature Granulocytes: 0.01 10*3/uL (ref 0.00–0.07)
Basophils Absolute: 0 10*3/uL (ref 0.0–0.1)
Basophils Relative: 1 %
Eosinophils Absolute: 0.3 10*3/uL (ref 0.0–0.5)
Eosinophils Relative: 5 %
HCT: 37.6 % (ref 36.0–46.0)
Hemoglobin: 12.7 g/dL (ref 12.0–15.0)
Immature Granulocytes: 0 %
Lymphocytes Relative: 36 %
Lymphs Abs: 1.8 10*3/uL (ref 0.7–4.0)
MCH: 32.2 pg (ref 26.0–34.0)
MCHC: 33.8 g/dL (ref 30.0–36.0)
MCV: 95.4 fL (ref 80.0–100.0)
Monocytes Absolute: 0.4 10*3/uL (ref 0.1–1.0)
Monocytes Relative: 9 %
Neutro Abs: 2.3 10*3/uL (ref 1.7–7.7)
Neutrophils Relative %: 49 %
Platelets: 194 10*3/uL (ref 150–400)
RBC: 3.94 MIL/uL (ref 3.87–5.11)
RDW: 13.4 % (ref 11.5–15.5)
WBC: 4.8 10*3/uL (ref 4.0–10.5)
nRBC: 0 % (ref 0.0–0.2)

## 2021-06-16 LAB — TSH: TSH: 1.116 u[IU]/mL (ref 0.350–4.500)

## 2021-06-16 MED ORDER — SODIUM CHLORIDE 0.9 % IV SOLN: Freq: Once | INTRAVENOUS | Status: AC

## 2021-06-16 MED ORDER — SODIUM CHLORIDE 0.9 % IV SOLN
200.0000 mg | Freq: Once | INTRAVENOUS | Status: AC
  Administered 2021-06-16: 200 mg via INTRAVENOUS
  Filled 2021-06-16: qty 8

## 2021-06-16 MED ORDER — HEPARIN SOD (PORK) LOCK FLUSH 100 UNIT/ML IV SOLN
500.0000 [IU] | Freq: Once | INTRAVENOUS | Status: AC | PRN
Start: 2021-06-16 — End: 2021-06-16
  Administered 2021-06-16: 500 [IU]

## 2021-06-16 MED ORDER — LIDOCAINE-PRILOCAINE 2.5-2.5 % EX CREA: TOPICAL_CREAM | CUTANEOUS | 6 refills | Status: DC

## 2021-06-16 MED ORDER — SODIUM CHLORIDE 0.9% FLUSH
10.0000 mL | INTRAVENOUS | Status: DC | PRN
  Administered 2021-06-16: 10 mL

## 2021-06-16 NOTE — Patient Instructions (Signed)
[facility]  Discharge Instructions: Thank you for choosing [facility] to provide your oncology and hematology care.  If you have a lab appointment with the Cancer Center, please come in thru the Main Entrance and check in at the main information desk.  Wear comfortable clothing and clothing appropriate for easy access to any Portacath or PICC line.   We strive to give you quality time with your provider. You may need to reschedule your appointment if you arrive late (15 or more minutes).  Arriving late affects you and other patients whose appointments are after yours.  Also, if you miss three or more appointments without notifying the office, you may be dismissed from the clinic at the provider's discretion.      For prescription refill requests, have your pharmacy contact our office and allow 72 hours for refills to be completed.    Today you received the following chemotherapy and/or immunotherapy agents Keytruda       To help prevent nausea and vomiting after your treatment, we encourage you to take your nausea medication as directed.  BELOW ARE SYMPTOMS THAT SHOULD BE REPORTED IMMEDIATELY: *FEVER GREATER THAN 100.4 F (38 C) OR HIGHER *CHILLS OR SWEATING *NAUSEA AND VOMITING THAT IS NOT CONTROLLED WITH YOUR NAUSEA MEDICATION *UNUSUAL SHORTNESS OF BREATH *UNUSUAL BRUISING OR BLEEDING *URINARY PROBLEMS (pain or burning when urinating, or frequent urination) *BOWEL PROBLEMS (unusual diarrhea, constipation, pain near the anus) TENDERNESS IN MOUTH AND THROAT WITH OR WITHOUT PRESENCE OF ULCERS (sore throat, sores in mouth, or a toothache) UNUSUAL RASH, SWELLING OR PAIN  UNUSUAL VAGINAL DISCHARGE OR ITCHING   Items with * indicate a potential emergency and should be followed up as soon as possible or go to the Emergency Department if any problems should occur.  Please show the CHEMOTHERAPY ALERT CARD or IMMUNOTHERAPY ALERT CARD at check-in to the Emergency  Department and triage nurse.  Should you have questions after your visit or need to cancel or reschedule your appointment, please contact [facility] [phone]  and follow the prompts.  Office hours are 8:00 a.m. to 4:30 p.m. Monday - Friday. Please note that voicemails left after 4:00 p.m. may not be returned until the following business day.  We are closed weekends and major holidays. You have access to a nurse at all times for urgent questions. Please call the main number to the clinic [phone] and follow the prompts.  For any non-urgent questions, you may also contact your provider using MyChart. We now offer e-Visits for anyone 18 and older to request care online for non-urgent symptoms. For details visit [email or web address].   Also download the MyChart app! Go to the app store, search "MyChart", open the app, select [facility], and log in with your MyChart username and password.  Due to Covid, a mask is required upon entering the hospital/clinic. If you do not have a mask, one will be given to you upon arrival. For doctor visits, patients may have 1 support person aged 18 or older with them. For treatment visits, patients cannot have anyone with them due to current Covid guidelines and our immunocompromised population.  

## 2021-06-16 NOTE — Progress Notes (Signed)
Pt presents today for Keytruda per provider's order. Vital signs stable  and pt voiced no new complaints at this time.  Labs reviewed and within parameters for treatment.  Keytruda given today per MD orders. Tolerated infusion without adverse affects. Vital signs stable. No complaints at this time. Discharged from clinic ambulatory in stable condition. Alert and oriented x 3. F/U with [facility] as scheduled.

## 2021-07-07 ENCOUNTER — Inpatient Hospital Stay (HOSPITAL_COMMUNITY): Payer: BC Managed Care – PPO

## 2021-07-07 ENCOUNTER — Other Ambulatory Visit: Payer: Self-pay

## 2021-07-07 VITALS — BP 124/67 | HR 67 | Temp 97.6°F | Resp 18

## 2021-07-07 DIAGNOSIS — C50812 Malignant neoplasm of overlapping sites of left female breast: Secondary | ICD-10-CM | POA: Diagnosis not present

## 2021-07-07 DIAGNOSIS — Z171 Estrogen receptor negative status [ER-]: Secondary | ICD-10-CM

## 2021-07-07 LAB — CBC WITH DIFFERENTIAL/PLATELET
Abs Immature Granulocytes: 0.02 10*3/uL (ref 0.00–0.07)
Basophils Absolute: 0 10*3/uL (ref 0.0–0.1)
Basophils Relative: 1 %
Eosinophils Absolute: 0.2 10*3/uL (ref 0.0–0.5)
Eosinophils Relative: 4 %
HCT: 38.7 % (ref 36.0–46.0)
Hemoglobin: 12.7 g/dL (ref 12.0–15.0)
Immature Granulocytes: 0 %
Lymphocytes Relative: 39 %
Lymphs Abs: 2.2 10*3/uL (ref 0.7–4.0)
MCH: 31.1 pg (ref 26.0–34.0)
MCHC: 32.8 g/dL (ref 30.0–36.0)
MCV: 94.6 fL (ref 80.0–100.0)
Monocytes Absolute: 0.4 10*3/uL (ref 0.1–1.0)
Monocytes Relative: 7 %
Neutro Abs: 2.7 10*3/uL (ref 1.7–7.7)
Neutrophils Relative %: 49 %
Platelets: 190 10*3/uL (ref 150–400)
RBC: 4.09 MIL/uL (ref 3.87–5.11)
RDW: 13.2 % (ref 11.5–15.5)
WBC: 5.5 10*3/uL (ref 4.0–10.5)
nRBC: 0 % (ref 0.0–0.2)

## 2021-07-07 LAB — COMPREHENSIVE METABOLIC PANEL
ALT: 18 U/L (ref 0–44)
AST: 20 U/L (ref 15–41)
Albumin: 4 g/dL (ref 3.5–5.0)
Alkaline Phosphatase: 79 U/L (ref 38–126)
Anion gap: 6 (ref 5–15)
BUN: 15 mg/dL (ref 8–23)
CO2: 26 mmol/L (ref 22–32)
Calcium: 9.1 mg/dL (ref 8.9–10.3)
Chloride: 106 mmol/L (ref 98–111)
Creatinine, Ser: 0.64 mg/dL (ref 0.44–1.00)
GFR, Estimated: >60 mL/min (ref >60)
Glucose, Bld: 90 mg/dL (ref 70–99)
Potassium: 3.6 mmol/L (ref 3.5–5.1)
Sodium: 138 mmol/L (ref 135–145)
Total Bilirubin: 0.3 mg/dL (ref 0.3–1.2)
Total Protein: 6.6 g/dL (ref 6.5–8.1)

## 2021-07-07 LAB — TSH: TSH: 1.354 u[IU]/mL (ref 0.350–4.500)

## 2021-07-07 MED ORDER — SODIUM CHLORIDE 0.9 % IV SOLN
200.0000 mg | Freq: Once | INTRAVENOUS | Status: AC
  Administered 2021-07-07: 200 mg via INTRAVENOUS
  Filled 2021-07-07: qty 8

## 2021-07-07 MED ORDER — HEPARIN SOD (PORK) LOCK FLUSH 100 UNIT/ML IV SOLN
500.0000 [IU] | Freq: Once | INTRAVENOUS | Status: AC | PRN
Start: 2021-07-07 — End: 2021-07-07
  Administered 2021-07-07: 500 [IU]

## 2021-07-07 MED ORDER — SODIUM CHLORIDE 0.9% FLUSH
10.0000 mL | INTRAVENOUS | Status: DC | PRN
  Administered 2021-07-07: 10 mL

## 2021-07-07 MED ORDER — SODIUM CHLORIDE 0.9 % IV SOLN: Freq: Once | INTRAVENOUS | Status: AC

## 2021-07-07 NOTE — Progress Notes (Signed)
Patient presents today for Keytruda. Vital signs within parameters for treatment. Labs within parameters for treatment.

## 2021-07-07 NOTE — Progress Notes (Signed)
Keytruda given today per MD orders. Tolerated infusion without adverse affects. Vital signs stable. No complaints at this time. Discharged from clinic ambulatory in stable condition. Alert and oriented x 3. F/U with [facility] as scheduled.  

## 2021-07-07 NOTE — Patient Instructions (Signed)
[facility]  Discharge Instructions: Thank you for choosing [facility] to provide your oncology and hematology care.  If you have a lab appointment with the Cancer Center, please come in thru the Main Entrance and check in at the main information desk.  Wear comfortable clothing and clothing appropriate for easy access to any Portacath or PICC line.   We strive to give you quality time with your provider. You may need to reschedule your appointment if you arrive late (15 or more minutes).  Arriving late affects you and other patients whose appointments are after yours.  Also, if you miss three or more appointments without notifying the office, you may be dismissed from the clinic at the provider's discretion.      For prescription refill requests, have your pharmacy contact our office and allow 72 hours for refills to be completed.    Today you received the following chemotherapy and/or immunotherapy agents Keytruda       To help prevent nausea and vomiting after your treatment, we encourage you to take your nausea medication as directed.  BELOW ARE SYMPTOMS THAT SHOULD BE REPORTED IMMEDIATELY: *FEVER GREATER THAN 100.4 F (38 C) OR HIGHER *CHILLS OR SWEATING *NAUSEA AND VOMITING THAT IS NOT CONTROLLED WITH YOUR NAUSEA MEDICATION *UNUSUAL SHORTNESS OF BREATH *UNUSUAL BRUISING OR BLEEDING *URINARY PROBLEMS (pain or burning when urinating, or frequent urination) *BOWEL PROBLEMS (unusual diarrhea, constipation, pain near the anus) TENDERNESS IN MOUTH AND THROAT WITH OR WITHOUT PRESENCE OF ULCERS (sore throat, sores in mouth, or a toothache) UNUSUAL RASH, SWELLING OR PAIN  UNUSUAL VAGINAL DISCHARGE OR ITCHING   Items with * indicate a potential emergency and should be followed up as soon as possible or go to the Emergency Department if any problems should occur.  Please show the CHEMOTHERAPY ALERT CARD or IMMUNOTHERAPY ALERT CARD at check-in to the Emergency  Department and triage nurse.  Should you have questions after your visit or need to cancel or reschedule your appointment, please contact  CANCER CENTER [phone]  and follow the prompts.  Office hours are 8:00 a.m. to 4:30 p.m. Monday - Friday. Please note that voicemails left after 4:00 p.m. may not be returned until the following business day.  We are closed weekends and major holidays. You have access to a nurse at all times for urgent questions. Please call the main number to the clinic [phone] and follow the prompts.  For any non-urgent questions, you may also contact your provider using MyChart. We now offer e-Visits for anyone 18 and older to request care online for non-urgent symptoms. For details visit [email or web address].   Also download the MyChart app! Go to the app store, search "MyChart", open the app, select [facility], and log in with your MyChart username and password.  Due to Covid, a mask is required upon entering the hospital/clinic. If you do not have a mask, one will be given to you upon arrival. For doctor visits, patients may have 1 support person aged 18 or older with them. For treatment visits, patients cannot have anyone with them due to current Covid guidelines and our immunocompromised population.  

## 2021-07-27 NOTE — Progress Notes (Signed)
[place], [place] [address]   CLINIC:  Medical Oncology/Hematology  PCP:  Roderic Scarce, MD [address] 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 Mammogram and US showed a 2.7cm mass at the 10:30 position, one left axillary lymph node with cortical thickening, calcifications in the lower inner left breast, and a 0.4cm group of calcifications in the right breast. Biopsy on 05/14/20 showed no malignancy in the right breast, and in the left breast, DCIS, high grade, ER/PR negative. 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    06/16/2020 -  Chemotherapy    Patient is on Treatment Plan: BREAST KEYTRUDA Q 21 DAYS       09/22/2020 Genetic Testing   Negative genetic testing. No pathogenic variants identified on the [facility] Common Hereditary Cancers Panel. The report date is 09/22/2020.  The Common Hereditary Cancers Panel offered by [facility] includes sequencing and/or deletion duplication testing of the following 48 genes: APC, ATM, AXIN2, BARD1, BMPR1A, BRCA1, BRCA2, BRIP1, CDH1, CDKN2A (p14ARF), CDKN2A (p16INK4a), CKD4, CHEK2, CTNNA1, DICER1, EPCAM (Deletion/duplication testing only), GREM1 (promoter region deletion/duplication testing  only), KIT, MEN1, MLH1, MSH2, MSH3, MSH6, MUTYH, NBN, NF1, NHTL1, PALB2, PDGFRA, PMS2, POLD1, POLE, PTEN, RAD50, RAD51C, RAD51D, RNF43, SDHB, SDHC, SDHD, SMAD4, SMARCA4. STK11, TP53, TSC1, TSC2, and VHL.  The following genes were evaluated for sequence changes only: SDHA and HOXB13 c.251G>A variant only.     CANCER STAGING: Cancer Staging Malignant neoplasm of overlapping sites of left breast in female, estrogen receptor negative ([place]) Staging form: Breast, AJCC 8th Edition - Clinical stage from 05/17/2020: Stage IIB (cT2, cN0, cM0, G3, ER-, PR-, HER2-) - Signed by Gardenia Phlegm, NP on 05/26/2020   INTERVAL HISTORY:  [name], a 70 y.o. female, returns for routine follow-up and consideration for next cycle of chemotherapy. Jo Mills was last seen on 05/26/2021.  Due for cycle #16 of triple negative left breast cancer today.   Overall, she tells me she has been feeling pretty well. She reports occasional episodes of diarrhea and constipation which was worst yesterday and she attributes to her history of diverticulosis. She denies skin rash. She reports stable neuropathy in her feet.   Overall, she feels ready for next cycle of chemo today.   REVIEW OF SYSTEMS:  Review of Systems  Constitutional:  Negative for appetite change and fatigue.  Gastrointestinal:  Positive for constipation and diarrhea.  Skin:  Negative for rash.  Neurological:  Positive for numbness (feet).  All other systems reviewed and are negative.  PAST MEDICAL/SURGICAL HISTORY:  Past Medical History:  Diagnosis Date   Allergy    Breast cancer ([place])    Cancer ([place])    Diverticulitis    Family history of breast cancer 08/17/2020  Family history of pancreatic cancer 08/17/2020   GERD (gastroesophageal reflux disease)    Hypertension    Hypothyroidism    Skin cancer    Past Surgical History:  Procedure Laterality Date   BREAST LUMPECTOMY WITH RADIOACTIVE SEED LOCALIZATION Left 01/12/2021   Procedure:  LEFT BREAST LUMPECTOMY X 2 WITH RADIOACTIVE SEED LOCALIZATION;  Surgeon: Donnie Mesa, MD;  Location: [facility];  Service: General;  Laterality: Left;   COLONOSCOPY     PORTACATH PLACEMENT N/A 06/15/2020   Procedure: INSERTION PORT-A-CATH WITH ULTRASOUND GUIDANCE;  Surgeon: Donnie Mesa, MD;  Location: [facility];  Service: General;  Laterality: Right;  LMA, LEAVE PORT ACCESSED-CHEMO START 9/1   SENTINEL NODE BIOPSY N/A 01/12/2021   Procedure: SENTINEL NODE BIOPSY;  Surgeon: Donnie Mesa, MD;  Location: [facility];  Service: General;  Laterality: N/A;    SOCIAL HISTORY:  Social History   Socioeconomic History   Marital status: Married    Spouse name: Not on file   Number of children: Not on file   Years of education: Not on file   Highest education level: Not on file  Occupational History   Occupation: High school librarian  Tobacco Use   Smoking status: Never   Smokeless tobacco: Never  Substance and Sexual Activity   Alcohol use: Not Currently   Drug use: Never   Sexual activity: Not on file  Other Topics Concern   Not on file  Social History Narrative   Not on file   Social Determinants of Health   Financial Resource Strain: Not on file  Food Insecurity: Not on file  Transportation Needs: Not on file  Physical Activity: Not on file  Stress: Not on file  Social Connections: Not on file  Intimate Partner Violence: Not on file    FAMILY HISTORY:  Family History  Problem Relation Age of Onset   Diverticulitis Mother    Hypertension Maternal Aunt    Hypertension Maternal Uncle    Cancer Paternal Grandmother        unknown type; d. 17s   Breast cancer Cousin 33       paternal    Pancreatic cancer Cousin        paternal; dx 34s    CURRENT MEDICATIONS:  Current Outpatient Medications  Medication Sig Dispense Refill   amLODipine-benazepril (LOTREL) 5-20 MG capsule Take 1 capsule by mouth daily.     Ascorbic Acid  (VITAMIN C) 1000 MG tablet Take 1,000 mg by mouth daily.     Bismuth Subsalicylate (PEPTO-BISMOL PO) Take by mouth as needed.     cholecalciferol (VITAMIN D3) 25 MCG (1000 UNIT) tablet Take 1,000 Units by mouth daily.     fexofenadine (ALLEGRA) 180 MG tablet Take 180 mg by mouth daily.     fluconazole (DIFLUCAN) 100 MG tablet Take 1 tablet (100 mg total) by mouth daily. 7 tablet 0   fluticasone (FLONASE) 50 MCG/ACT nasal spray Place into both nostrils daily.     ketoconazole (NIZORAL) 2 % cream Apply 1 application topically daily. 30 g 1   ketorolac (ACULAR) 0.5 % ophthalmic solution Place 1 drop into the right eye 4 (four) times daily.     Lactobacillus-Inulin (CULTURELLE DIGESTIVE DAILY) CAPS Take 1 capsule by mouth daily.     levothyroxine (SYNTHROID) 100 MCG tablet Take 1 tablet (100 mcg total) by mouth daily before breakfast. 30 tablet 3   lidocaine-prilocaine (EMLA) cream Apply small amount prior to port access 30 g 6  moxifloxacin (VIGAMOX) 0.5 % ophthalmic solution Place 1 drop into the right eye 4 (four) times daily.     omeprazole (PRILOSEC) 20 MG capsule Take 20 mg by mouth daily.     prednisoLONE acetate (PRED FORTE) 1 % ophthalmic suspension Place 1 drop into the right eye 4 (four) times daily.     LORazepam (ATIVAN) 0.5 MG tablet Take 1 tablet (0.5 mg total) by mouth at bedtime as needed for sleep. (Patient not taking: Reported on 07/28/2021) 30 tablet 0   magic mouthwash SOLN Take 5 mLs by mouth 3 (three) times daily as needed for mouth pain. (Patient not taking: Reported on 07/28/2021) 450 mL 0   ondansetron (ZOFRAN) 8 MG tablet Take 1 tablet (8 mg total) by mouth 2 (two) times daily as needed. Start on the third day after chemotherapy. (Patient not taking: Reported on 07/28/2021) 30 tablet 1   prochlorperazine (COMPAZINE) 10 MG tablet Take 1 tablet (10 mg total) by mouth every 6 (six) hours as needed (Nausea or vomiting). (Patient not taking: Reported on 07/28/2021) 30 tablet 1    No current facility-administered medications for this visit.   Facility-Administered Medications Ordered in Other Visits  Medication Dose Route Frequency Provider Last Rate Last Admin   sodium chloride flush (NS) 0.9 % injection 10 mL  10 mL Intracatheter PRN Ladell Pier, MD        ALLERGIES:  Allergies  Allergen Reactions   Grapefruit Extract Other (See Comments)    Cannot take with blood pressure medications   Pseudoephedrine Hcl Other (See Comments)    Other Reaction: Other reaction   Nickel    Pineapple    Amoxicillin Rash    PHYSICAL EXAM:  Performance status (ECOG): 1 - Symptomatic but completely ambulatory  Vitals:   07/28/21 1241  BP: 123/89  Pulse: 78  Resp: 18  Temp: 99.1 F (37.3 C)  SpO2: 96%   Wt Readings from Last 3 Encounters:  07/28/21 162 lb 4.8 oz (73.6 kg)  07/07/21 164 lb 12.8 oz (74.8 kg)  06/16/21 163 lb 3.2 oz (74 kg)   Physical Exam Vitals reviewed.  Constitutional:      Appearance: Normal appearance.  Cardiovascular:     Rate and Rhythm: Normal rate and regular rhythm.     Pulses: Normal pulses.     Heart sounds: Normal heart sounds.  Pulmonary:     Effort: Pulmonary effort is normal.     Breath sounds: Normal breath sounds.  Abdominal:     Palpations: Abdomen is soft. There is no hepatomegaly, splenomegaly or mass.     Tenderness: There is no abdominal tenderness.  Neurological:     General: No focal deficit present.     Mental Status: She is alert and oriented to person, place, and time.  Psychiatric:        Mood and Affect: Mood normal.        Behavior: Behavior normal.    LABORATORY DATA:  I have reviewed the labs as listed.  CBC Latest Ref Rng & Units 07/28/2021 07/07/2021 06/16/2021  WBC 4.0 - 10.5 K/uL 6.4 5.5 4.8  Hemoglobin 12.0 - 15.0 g/dL 13.3 12.7 12.7  Hematocrit 36.0 - 46.0 % 38.8 38.7 37.6  Platelets 150 - 400 K/uL 169 190 194   CMP Latest Ref Rng & Units 07/28/2021 07/07/2021 06/16/2021  Glucose 70 - 99  mg/dL 93 90 98  BUN 8 - 23 mg/dL $Remove'9 15 13  'LXVggsv$ Creatinine 0.44 - 1.00 mg/dL 0.68 0.64  0.62  Sodium 135 - 145 mmol/L 134(L) 138 135  Potassium 3.5 - 5.1 mmol/L 3.8 3.6 3.7  Chloride 98 - 111 mmol/L 100 106 106  CO2 22 - 32 mmol/L $RemoveB'26 26 27  'AcRrdKfU$ Calcium 8.9 - 10.3 mg/dL 8.8(L) 9.1 8.8(L)  Total Protein 6.5 - 8.1 g/dL 6.7 6.6 6.6  Total Bilirubin 0.3 - 1.2 mg/dL 1.1 0.3 0.5  Alkaline Phos 38 - 126 U/L 82 79 74  AST 15 - 41 U/L $Remo'22 20 21  'Pphrp$ ALT 0 - 44 U/L $Remo'20 18 18    'NRRAL$ DIAGNOSTIC IMAGING:  I have independently reviewed the scans and discussed with the patient. No results found.   ASSESSMENT:  1.  Stage II (T2N0) left breast TNBC: -Felt left breast mass for 5 months. -Mammogram on 05/07/2020 with suspicious mass in the 10:30 o'clock position in the left breast.  Suspicious calcifications in the anterior lower inner quadrant of the left breast. -MRI on 06/05/2020 shows left breast mass measuring 3.8 x 3.3 x 3.2 cm in the upper inner quadrant.  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  No suspicious lymphadenopathy.  No MRI evidence of malignancy on the right. -Left breast upper inner quadrant biopsy on 05/14/2020 showed DCIS with calcifications.  Right breast biopsy shows fibroadenomatoid nodule with calcifications.  ER/PR was negative. -Left breast core needle biopsy at 10:30 position shows invasive ductal carcinoma.  ER/PR/HER-2 was negative.  Ki-67 85%.  Left lymph node biopsy was reactive. -Patient was evaluated by Dr. [name] and Dr. Georgette Dover. -She received first cycle of chemotherapy with Adriamycin and cyclophosphamide with pembrolizumab (keynote-522 trial) added for improved PCR by about 15%. -Cycle 1 of Adriamycin, cyclophosphamide and pembrolizumab on 06/16/2020. - Left lumpectomy and SLNB on 01/12/2021, YPT0, ypN0.   2.  Family history: -Paternal cousin and second cousin had breast cancer.  Paternal grandmother also had some type of cancer. -Another paternal cousin died of  pancreatic cancer.  Another paternal cousin had leukemia.   PLAN:  1.  Triple negative left breast cancer: -Adjuvant Keytruda was started on 02/15/2021. - She completed radiation therapy to the left breast and chest wall. - She did not have any immunotherapy related side effects.  She had episode of diverticulitis yesterday and had few stools of diarrhea which was self-limited.  Today she does not have any diarrhea.  No dry cough or shortness of breath on exertion noted. - Reviewed labs from today which shows normal LFTs and CBC.  TSH was normal. - She will proceed with eighth treatment of Keytruda today.  She will come back in 3 weeks and complete 9 cycles of Keytruda. - We will order for mammogram as it is due at this point. - Recommend follow-up in 3 months with repeat labs and tumor markers and exam.    2.  Family history: -Germline mutation testing was negative for BRCA1/2.   3.  Skin rash: -She did not have any skin rash since Keytruda was restarted.  Skin rash was from paclitaxel.   4.  High risk drug monitoring: -Continue Synthroid 100 mcg daily.  TSH today is 1.4.   5.  Neuropathy: -Mild numbness in the toes has been stable.  Numbness in the fingertips improved.  No neuropathic pains.   Breast Cancer therapy associated bone loss: I have recommended calcium, Vitamin D and weight bearing exercises.   Orders placed this encounter:  No orders of the defined types were placed in this encounter.    Derek Jack, MD  [place] [phone]   I, Thana Ates, am acting as a scribe for Dr. Derek Jack.  I, Derek Jack MD, have reviewed the above documentation for accuracy and completeness, and I agree with the above.

## 2021-07-28 ENCOUNTER — Other Ambulatory Visit: Payer: Self-pay

## 2021-07-28 ENCOUNTER — Inpatient Hospital Stay (HOSPITAL_COMMUNITY): Payer: BC Managed Care – PPO | Attending: Hematology

## 2021-07-28 ENCOUNTER — Inpatient Hospital Stay (HOSPITAL_BASED_OUTPATIENT_CLINIC_OR_DEPARTMENT_OTHER): Payer: BC Managed Care – PPO | Admitting: Hematology

## 2021-07-28 ENCOUNTER — Inpatient Hospital Stay (HOSPITAL_COMMUNITY): Payer: BC Managed Care – PPO

## 2021-07-28 VITALS — BP 117/70 | HR 77 | Temp 97.8°F | Resp 17

## 2021-07-28 VITALS — BP 123/89 | HR 78 | Temp 99.1°F | Resp 18 | Wt 162.3 lb

## 2021-07-28 DIAGNOSIS — Z803 Family history of malignant neoplasm of breast: Secondary | ICD-10-CM | POA: Insufficient documentation

## 2021-07-28 DIAGNOSIS — R197 Diarrhea, unspecified: Secondary | ICD-10-CM | POA: Insufficient documentation

## 2021-07-28 DIAGNOSIS — Z171 Estrogen receptor negative status [ER-]: Secondary | ICD-10-CM

## 2021-07-28 DIAGNOSIS — Z5112 Encounter for antineoplastic immunotherapy: Secondary | ICD-10-CM | POA: Diagnosis present

## 2021-07-28 DIAGNOSIS — C50812 Malignant neoplasm of overlapping sites of left female breast: Secondary | ICD-10-CM | POA: Diagnosis not present

## 2021-07-28 DIAGNOSIS — Z880 Allergy status to penicillin: Secondary | ICD-10-CM | POA: Diagnosis not present

## 2021-07-28 DIAGNOSIS — K5900 Constipation, unspecified: Secondary | ICD-10-CM | POA: Diagnosis not present

## 2021-07-28 DIAGNOSIS — N6314 Unspecified lump in the right breast, lower inner quadrant: Secondary | ICD-10-CM | POA: Insufficient documentation

## 2021-07-28 DIAGNOSIS — R200 Anesthesia of skin: Secondary | ICD-10-CM | POA: Insufficient documentation

## 2021-07-28 DIAGNOSIS — Z800 Family history of malignant neoplasm of digestive organs: Secondary | ICD-10-CM | POA: Diagnosis not present

## 2021-07-28 DIAGNOSIS — G629 Polyneuropathy, unspecified: Secondary | ICD-10-CM | POA: Insufficient documentation

## 2021-07-28 DIAGNOSIS — Z923 Personal history of irradiation: Secondary | ICD-10-CM | POA: Diagnosis not present

## 2021-07-28 DIAGNOSIS — Z8249 Family history of ischemic heart disease and other diseases of the circulatory system: Secondary | ICD-10-CM | POA: Diagnosis not present

## 2021-07-28 DIAGNOSIS — Z85828 Personal history of other malignant neoplasm of skin: Secondary | ICD-10-CM | POA: Diagnosis not present

## 2021-07-28 DIAGNOSIS — Z8379 Family history of other diseases of the digestive system: Secondary | ICD-10-CM | POA: Insufficient documentation

## 2021-07-28 DIAGNOSIS — N6311 Unspecified lump in the right breast, upper outer quadrant: Secondary | ICD-10-CM | POA: Diagnosis not present

## 2021-07-28 DIAGNOSIS — Z79899 Other long term (current) drug therapy: Secondary | ICD-10-CM | POA: Insufficient documentation

## 2021-07-28 LAB — CBC WITH DIFFERENTIAL/PLATELET
Abs Immature Granulocytes: 0.02 10*3/uL (ref 0.00–0.07)
Basophils Absolute: 0 10*3/uL (ref 0.0–0.1)
Basophils Relative: 1 %
Eosinophils Absolute: 0.2 10*3/uL (ref 0.0–0.5)
Eosinophils Relative: 3 %
HCT: 38.8 % (ref 36.0–46.0)
Hemoglobin: 13.3 g/dL (ref 12.0–15.0)
Immature Granulocytes: 0 %
Lymphocytes Relative: 26 %
Lymphs Abs: 1.6 10*3/uL (ref 0.7–4.0)
MCH: 32.5 pg (ref 26.0–34.0)
MCHC: 34.3 g/dL (ref 30.0–36.0)
MCV: 94.9 fL (ref 80.0–100.0)
Monocytes Absolute: 0.5 10*3/uL (ref 0.1–1.0)
Monocytes Relative: 8 %
Neutro Abs: 4 10*3/uL (ref 1.7–7.7)
Neutrophils Relative %: 62 %
Platelets: 169 10*3/uL (ref 150–400)
RBC: 4.09 MIL/uL (ref 3.87–5.11)
RDW: 13.3 % (ref 11.5–15.5)
WBC: 6.4 10*3/uL (ref 4.0–10.5)
nRBC: 0 % (ref 0.0–0.2)

## 2021-07-28 LAB — COMPREHENSIVE METABOLIC PANEL
ALT: 20 U/L (ref 0–44)
AST: 22 U/L (ref 15–41)
Albumin: 4.1 g/dL (ref 3.5–5.0)
Alkaline Phosphatase: 82 U/L (ref 38–126)
Anion gap: 8 (ref 5–15)
BUN: 9 mg/dL (ref 8–23)
CO2: 26 mmol/L (ref 22–32)
Calcium: 8.8 mg/dL — ABNORMAL LOW (ref 8.9–10.3)
Chloride: 100 mmol/L (ref 98–111)
Creatinine, Ser: 0.68 mg/dL (ref 0.44–1.00)
GFR, Estimated: >60 mL/min (ref >60)
Glucose, Bld: 93 mg/dL (ref 70–99)
Potassium: 3.8 mmol/L (ref 3.5–5.1)
Sodium: 134 mmol/L — ABNORMAL LOW (ref 135–145)
Total Bilirubin: 1.1 mg/dL (ref 0.3–1.2)
Total Protein: 6.7 g/dL (ref 6.5–8.1)

## 2021-07-28 LAB — TSH: TSH: 1.405 u[IU]/mL (ref 0.350–4.500)

## 2021-07-28 MED ORDER — HEPARIN SOD (PORK) LOCK FLUSH 100 UNIT/ML IV SOLN
500.0000 [IU] | Freq: Once | INTRAVENOUS | Status: AC | PRN
  Administered 2021-07-28: 500 [IU]

## 2021-07-28 MED ORDER — SODIUM CHLORIDE 0.9% FLUSH
10.0000 mL | INTRAVENOUS | Status: DC | PRN
  Administered 2021-07-28: 10 mL

## 2021-07-28 MED ORDER — SODIUM CHLORIDE 0.9 % IV SOLN: Freq: Once | INTRAVENOUS | Status: AC

## 2021-07-28 MED ORDER — SODIUM CHLORIDE 0.9 % IV SOLN
200.0000 mg | Freq: Once | INTRAVENOUS | Status: AC
  Administered 2021-07-28: 200 mg via INTRAVENOUS
  Filled 2021-07-28: qty 8

## 2021-07-28 NOTE — Progress Notes (Signed)
Patient has been examined, vital signs and labs have been reviewed by Dr. Katragadda. ANC, Creatinine, LFTs, hemoglobin, and platelets are within treatment parameters per Dr. Katragadda. Patient may proceed with treatment per M.D.   

## 2021-07-28 NOTE — Progress Notes (Signed)
Patients port flushed without difficulty.  Good blood return noted with no bruising or swelling noted at site.  Stable during access and blood draw.  Patient to remain accessed for treatment. 

## 2021-07-28 NOTE — Patient Instructions (Signed)
[facility]  Discharge Instructions: ?Thank you for choosing [facility] to provide your oncology and hematology care.  ?If you have a lab appointment with the Cancer Center, please come in thru the Main Entrance and check in at the main information desk. ? ?Wear comfortable clothing and clothing appropriate for easy access to any Portacath or PICC line.  ? ?We strive to give you quality time with your provider. You may need to reschedule your appointment if you arrive late (15 or more minutes).  Arriving late affects you and other patients whose appointments are after yours.  Also, if you miss three or more appointments without notifying the office, you may be dismissed from the clinic at the provider?s discretion.    ?  ?For prescription refill requests, have your pharmacy contact our office and allow 72 hours for refills to be completed.   ? ?Today you received the following chemotherapy and/or immunotherapy agents Keytruda, return as scheduled.  ?  ?To help prevent nausea and vomiting after your treatment, we encourage you to take your nausea medication as directed. ? ?BELOW ARE SYMPTOMS THAT SHOULD BE REPORTED IMMEDIATELY: ?*FEVER GREATER THAN 100.4 F (38 ?C) OR HIGHER ?*CHILLS OR SWEATING ?*NAUSEA AND VOMITING THAT IS NOT CONTROLLED WITH YOUR NAUSEA MEDICATION ?*UNUSUAL SHORTNESS OF BREATH ?*UNUSUAL BRUISING OR BLEEDING ?*URINARY PROBLEMS (pain or burning when urinating, or frequent urination) ?*BOWEL PROBLEMS (unusual diarrhea, constipation, pain near the anus) ?TENDERNESS IN MOUTH AND THROAT WITH OR WITHOUT PRESENCE OF ULCERS (sore throat, sores in mouth, or a toothache) ?UNUSUAL RASH, SWELLING OR PAIN  ?UNUSUAL VAGINAL DISCHARGE OR ITCHING  ? ?Items with * indicate a potential emergency and should be followed up as soon as possible or go to the Emergency Department if any problems should occur. ? ?Please show the CHEMOTHERAPY ALERT CARD or IMMUNOTHERAPY ALERT CARD at check-in to  the Emergency Department and triage nurse. ? ?Should you have questions after your visit or need to cancel or reschedule your appointment, please contact [facility] [phone]  and follow the prompts.  Office hours are 8:00 a.m. to 4:30 p.m. Monday - Friday. Please note that voicemails left after 4:00 p.m. may not be returned until the following business day.  We are closed weekends and major holidays. You have access to a nurse at all times for urgent questions. Please call the main number to the clinic [phone] and follow the prompts. ? ?For any non-urgent questions, you may also contact your provider using MyChart. We now offer e-Visits for anyone 18 and older to request care online for non-urgent symptoms. For details visit [email or web address]. ?  ?Also download the MyChart app! Go to the app store, search "MyChart", open the app, select , and log in with your MyChart username and password. ? ?Due to Covid, a mask is required upon entering the hospital/clinic. If you do not have a mask, one will be given to you upon arrival. For doctor visits, patients may have 1 support person aged 18 or older with them. For treatment visits, patients cannot have anyone with them due to current Covid guidelines and our immunocompromised population.  ?

## 2021-07-28 NOTE — Progress Notes (Signed)
Patient tolerated chemotherapy with no complaints voiced. Side effects with management reviewed understanding verbalized. Port site clean and dry with no bruising or swelling noted at site. Good blood return noted before and after administration of chemotherapy. Band aid applied. Patient left in satisfactory condition with VSS and no s/s of distress noted. 

## 2021-07-29 ENCOUNTER — Encounter (HOSPITAL_COMMUNITY): Payer: Self-pay | Admitting: Hematology

## 2021-07-29 ENCOUNTER — Encounter: Payer: Self-pay | Admitting: Hematology and Oncology

## 2021-08-18 ENCOUNTER — Other Ambulatory Visit (HOSPITAL_COMMUNITY): Payer: BC Managed Care – PPO

## 2021-08-18 ENCOUNTER — Ambulatory Visit (HOSPITAL_COMMUNITY): Payer: BC Managed Care – PPO

## 2021-09-01 ENCOUNTER — Inpatient Hospital Stay (HOSPITAL_COMMUNITY): Payer: BC Managed Care – PPO | Attending: Hematology

## 2021-09-01 ENCOUNTER — Inpatient Hospital Stay (HOSPITAL_COMMUNITY): Payer: BC Managed Care – PPO

## 2021-09-01 ENCOUNTER — Other Ambulatory Visit: Payer: Self-pay

## 2021-09-01 VITALS — BP 115/69 | HR 65 | Temp 97.8°F | Resp 18

## 2021-09-01 DIAGNOSIS — Z171 Estrogen receptor negative status [ER-]: Secondary | ICD-10-CM | POA: Insufficient documentation

## 2021-09-01 DIAGNOSIS — K5900 Constipation, unspecified: Secondary | ICD-10-CM | POA: Insufficient documentation

## 2021-09-01 DIAGNOSIS — C50812 Malignant neoplasm of overlapping sites of left female breast: Secondary | ICD-10-CM | POA: Insufficient documentation

## 2021-09-01 DIAGNOSIS — Z8379 Family history of other diseases of the digestive system: Secondary | ICD-10-CM | POA: Insufficient documentation

## 2021-09-01 DIAGNOSIS — R200 Anesthesia of skin: Secondary | ICD-10-CM | POA: Insufficient documentation

## 2021-09-01 DIAGNOSIS — G629 Polyneuropathy, unspecified: Secondary | ICD-10-CM | POA: Insufficient documentation

## 2021-09-01 DIAGNOSIS — Z5112 Encounter for antineoplastic immunotherapy: Secondary | ICD-10-CM | POA: Diagnosis present

## 2021-09-01 DIAGNOSIS — Z79899 Other long term (current) drug therapy: Secondary | ICD-10-CM | POA: Insufficient documentation

## 2021-09-01 DIAGNOSIS — Z803 Family history of malignant neoplasm of breast: Secondary | ICD-10-CM | POA: Diagnosis not present

## 2021-09-01 DIAGNOSIS — Z8249 Family history of ischemic heart disease and other diseases of the circulatory system: Secondary | ICD-10-CM | POA: Insufficient documentation

## 2021-09-01 DIAGNOSIS — Z809 Family history of malignant neoplasm, unspecified: Secondary | ICD-10-CM | POA: Insufficient documentation

## 2021-09-01 DIAGNOSIS — Z800 Family history of malignant neoplasm of digestive organs: Secondary | ICD-10-CM | POA: Diagnosis not present

## 2021-09-01 LAB — CBC WITH DIFFERENTIAL/PLATELET
Abs Immature Granulocytes: 0.01 10*3/uL (ref 0.00–0.07)
Basophils Absolute: 0 10*3/uL (ref 0.0–0.1)
Basophils Relative: 1 %
Eosinophils Absolute: 0.2 10*3/uL (ref 0.0–0.5)
Eosinophils Relative: 4 %
HCT: 37.6 % (ref 36.0–46.0)
Hemoglobin: 12.6 g/dL (ref 12.0–15.0)
Immature Granulocytes: 0 %
Lymphocytes Relative: 39 %
Lymphs Abs: 2 10*3/uL (ref 0.7–4.0)
MCH: 31.9 pg (ref 26.0–34.0)
MCHC: 33.5 g/dL (ref 30.0–36.0)
MCV: 95.2 fL (ref 80.0–100.0)
Monocytes Absolute: 0.4 10*3/uL (ref 0.1–1.0)
Monocytes Relative: 8 %
Neutro Abs: 2.4 10*3/uL (ref 1.7–7.7)
Neutrophils Relative %: 48 %
Platelets: 193 10*3/uL (ref 150–400)
RBC: 3.95 MIL/uL (ref 3.87–5.11)
RDW: 13.2 % (ref 11.5–15.5)
WBC: 5.1 10*3/uL (ref 4.0–10.5)
nRBC: 0 % (ref 0.0–0.2)

## 2021-09-01 LAB — COMPREHENSIVE METABOLIC PANEL
ALT: 21 U/L (ref 0–44)
AST: 25 U/L (ref 15–41)
Albumin: 4 g/dL (ref 3.5–5.0)
Alkaline Phosphatase: 78 U/L (ref 38–126)
Anion gap: 7 (ref 5–15)
BUN: 15 mg/dL (ref 8–23)
CO2: 26 mmol/L (ref 22–32)
Calcium: 9.3 mg/dL (ref 8.9–10.3)
Chloride: 104 mmol/L (ref 98–111)
Creatinine, Ser: 0.62 mg/dL (ref 0.44–1.00)
GFR, Estimated: >60 mL/min (ref >60)
Glucose, Bld: 93 mg/dL (ref 70–99)
Potassium: 3.7 mmol/L (ref 3.5–5.1)
Sodium: 137 mmol/L (ref 135–145)
Total Bilirubin: 0.5 mg/dL (ref 0.3–1.2)
Total Protein: 6.5 g/dL (ref 6.5–8.1)

## 2021-09-01 LAB — MAGNESIUM: Magnesium: 2 mg/dL (ref 1.7–2.4)

## 2021-09-01 LAB — TSH: TSH: 1.383 u[IU]/mL (ref 0.350–4.500)

## 2021-09-01 MED ORDER — SODIUM CHLORIDE 0.9 % IV SOLN
200.0000 mg | Freq: Once | INTRAVENOUS | Status: AC
  Administered 2021-09-01: 15:00:00 200 mg via INTRAVENOUS
  Filled 2021-09-01: qty 8

## 2021-09-01 MED ORDER — HEPARIN SOD (PORK) LOCK FLUSH 100 UNIT/ML IV SOLN
500.0000 [IU] | Freq: Once | INTRAVENOUS | Status: AC | PRN
  Administered 2021-09-01: 16:00:00 500 [IU]

## 2021-09-01 MED ORDER — SODIUM CHLORIDE 0.9 % IV SOLN
Freq: Once | INTRAVENOUS | Status: AC
Start: 2021-09-01 — End: 2021-09-01

## 2021-09-01 MED ORDER — SODIUM CHLORIDE 0.9% FLUSH
10.0000 mL | INTRAVENOUS | Status: DC | PRN
  Administered 2021-09-01: 16:00:00 10 mL

## 2021-09-01 NOTE — Progress Notes (Signed)
Pt presents today for Keytruda per provider's order. Vital signs and labs WNL for treatment today. Pt voiced no new complaints at this time. Okay for treatment today.  Keytruda given today per MD orders. Tolerated infusion without adverse affects. Vital signs stable. No complaints at this time. Discharged from clinic ambulatory in stable condition. Alert and oriented x 3. F/U with [facility] as scheduled.

## 2021-09-01 NOTE — Patient Instructions (Signed)
[facility]  Discharge Instructions: Thank you for choosing [facility] to provide your oncology and hematology care.  If you have a lab appointment with the Cancer Center, please come in thru the Main Entrance and check in at the main information desk.  Wear comfortable clothing and clothing appropriate for easy access to any Portacath or PICC line.   We strive to give you quality time with your provider. You may need to reschedule your appointment if you arrive late (15 or more minutes).  Arriving late affects you and other patients whose appointments are after yours.  Also, if you miss three or more appointments without notifying the office, you may be dismissed from the clinic at the provider's discretion.      For prescription refill requests, have your pharmacy contact our office and allow 72 hours for refills to be completed.    Today you received the following chemotherapy and/or immunotherapy agents Keytruda       To help prevent nausea and vomiting after your treatment, we encourage you to take your nausea medication as directed.  BELOW ARE SYMPTOMS THAT SHOULD BE REPORTED IMMEDIATELY: *FEVER GREATER THAN 100.4 F (38 C) OR HIGHER *CHILLS OR SWEATING *NAUSEA AND VOMITING THAT IS NOT CONTROLLED WITH YOUR NAUSEA MEDICATION *UNUSUAL SHORTNESS OF BREATH *UNUSUAL BRUISING OR BLEEDING *URINARY PROBLEMS (pain or burning when urinating, or frequent urination) *BOWEL PROBLEMS (unusual diarrhea, constipation, pain near the anus) TENDERNESS IN MOUTH AND THROAT WITH OR WITHOUT PRESENCE OF ULCERS (sore throat, sores in mouth, or a toothache) UNUSUAL RASH, SWELLING OR PAIN  UNUSUAL VAGINAL DISCHARGE OR ITCHING   Items with * indicate a potential emergency and should be followed up as soon as possible or go to the Emergency Department if any problems should occur.  Please show the CHEMOTHERAPY ALERT CARD or IMMUNOTHERAPY ALERT CARD at check-in to the Emergency  Department and triage nurse.  Should you have questions after your visit or need to cancel or reschedule your appointment, please contact [facility] [phone]  and follow the prompts.  Office hours are 8:00 a.m. to 4:30 p.m. Monday - Friday. Please note that voicemails left after 4:00 p.m. may not be returned until the following business day.  We are closed weekends and major holidays. You have access to a nurse at all times for urgent questions. Please call the main number to the clinic [phone] and follow the prompts.  For any non-urgent questions, you may also contact your provider using MyChart. We now offer e-Visits for anyone 18 and older to request care online for non-urgent symptoms. For details visit [email or web address].   Also download the MyChart app! Go to the app store, search "MyChart", open the app, select [place] [place], and log in with your MyChart username and password.  Due to Covid, a mask is required upon entering the hospital/clinic. If you do not have a mask, one will be given to you upon arrival. For doctor visits, patients may have 1 support person aged 18 or older with them. For treatment visits, patients cannot have anyone with them due to current Covid guidelines and our immunocompromised population.  

## 2021-09-02 LAB — CANCER ANTIGEN 15-3: CA 15-3: 22.6 U/mL (ref 0.0–25.0)

## 2021-09-02 LAB — CANCER ANTIGEN 27.29: CA 27.29: 25.5 U/mL (ref 0.0–38.6)

## 2021-09-30 ENCOUNTER — Other Ambulatory Visit (HOSPITAL_COMMUNITY): Payer: Self-pay | Admitting: Hematology

## 2021-10-14 ENCOUNTER — Ambulatory Visit
Admission: RE | Admit: 2021-10-14 | Discharge: 2021-10-14 | Disposition: A | Discharge status: Home / Self Care | Payer: BC Managed Care – PPO | Source: Ambulatory Visit | Attending: Hematology | Admitting: Hematology

## 2021-10-14 DIAGNOSIS — Z171 Estrogen receptor negative status [ER-]: Secondary | ICD-10-CM

## 2021-10-14 HISTORY — DX: Personal history of antineoplastic chemotherapy: Z92.21

## 2021-10-14 HISTORY — DX: Personal history of irradiation: Z92.3

## 2021-10-14 IMAGING — MG DIGITAL DIAGNOSTIC BILAT W/ TOMO W/ CAD
6 of 9 series · 6 of 25 positions shown · non-contrast
Comparison: Previous exam(s).

CLINICAL DATA: 70-year-old female presenting for routine
surveillance post left breast lumpectomy in [DATE].

EXAM:
DIGITAL DIAGNOSTIC BILATERAL MAMMOGRAM WITH TOMOSYNTHESIS AND CAD
TECHNIQUE: Bilateral digital diagnostic mammography and breast tomosynthesis
was performed. The images were evaluated with computer-aided
detection.

[L CC]
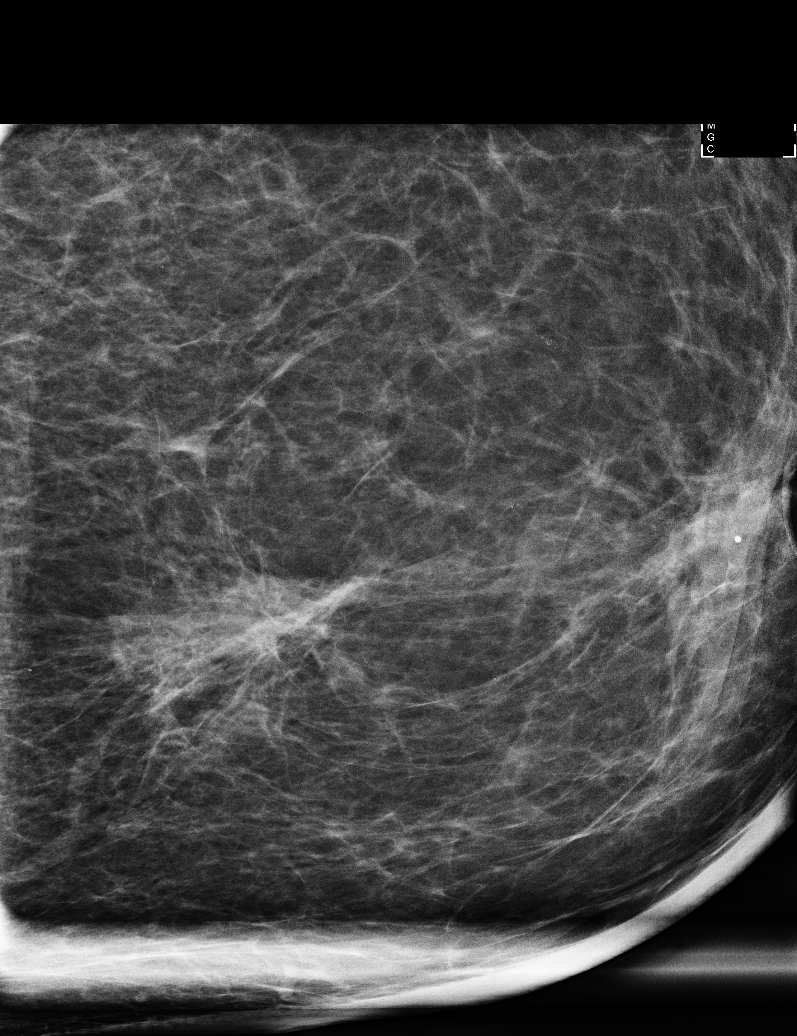

[L CC synth-2D]
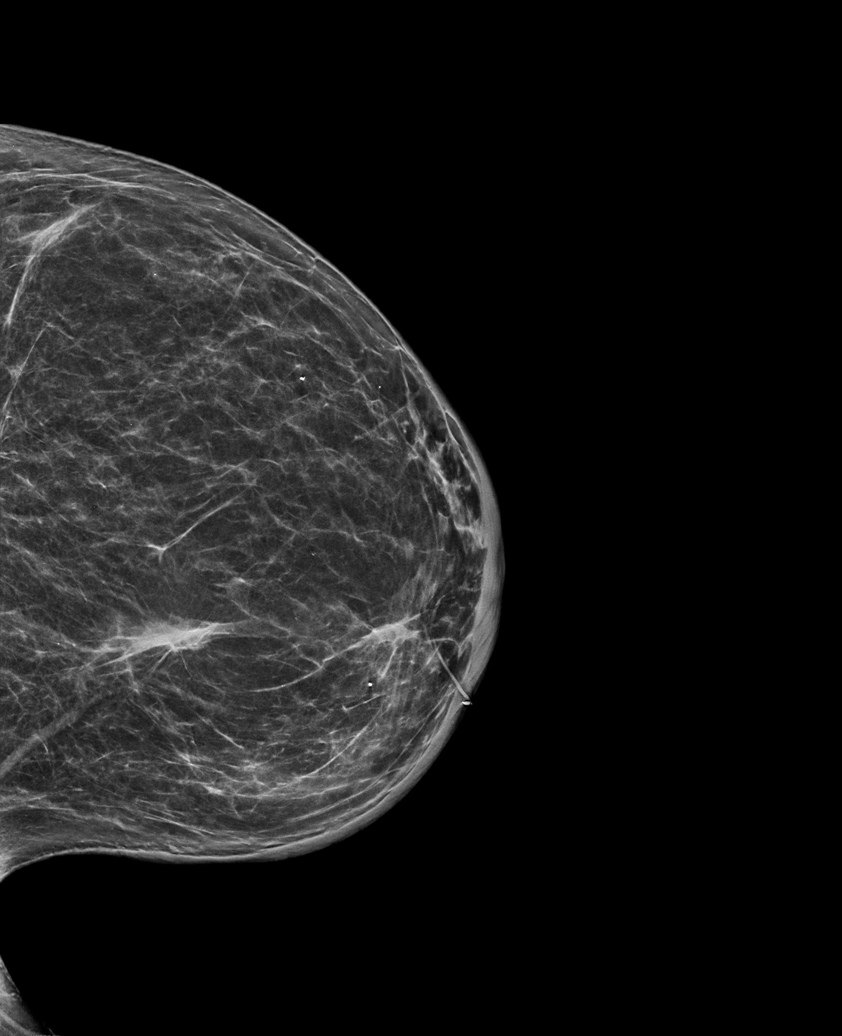

[L MLO synth-2D]
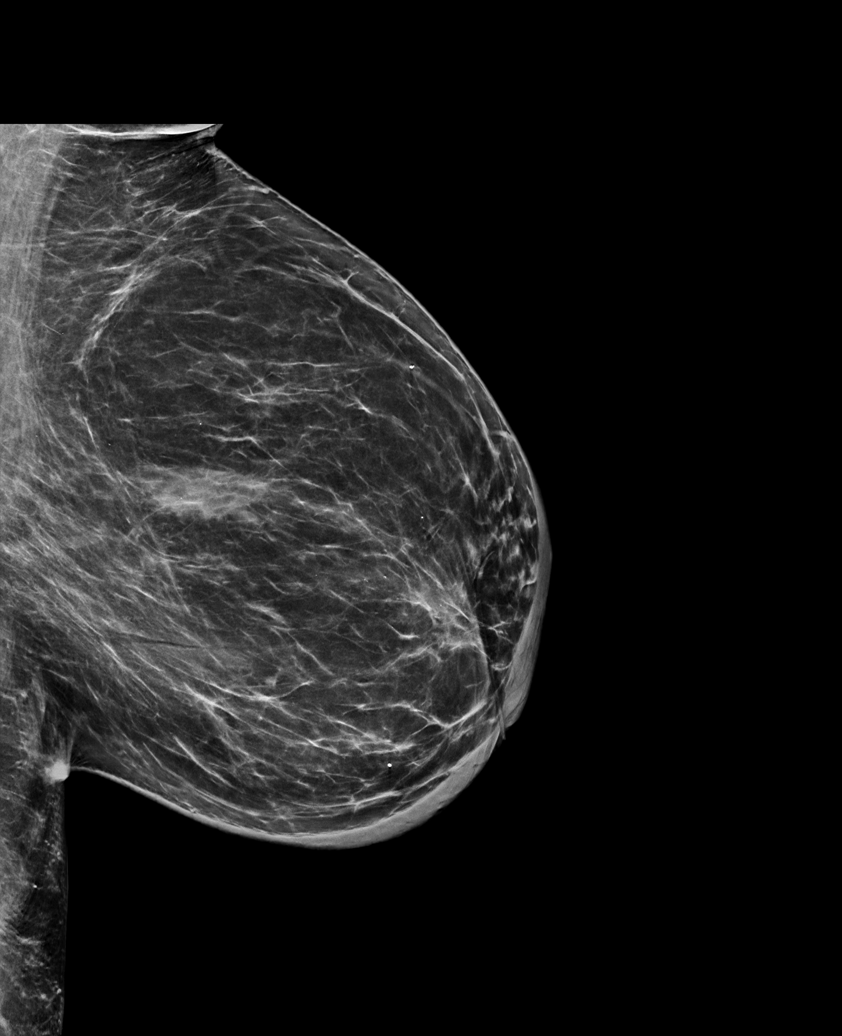

[R MLO synth-2D]
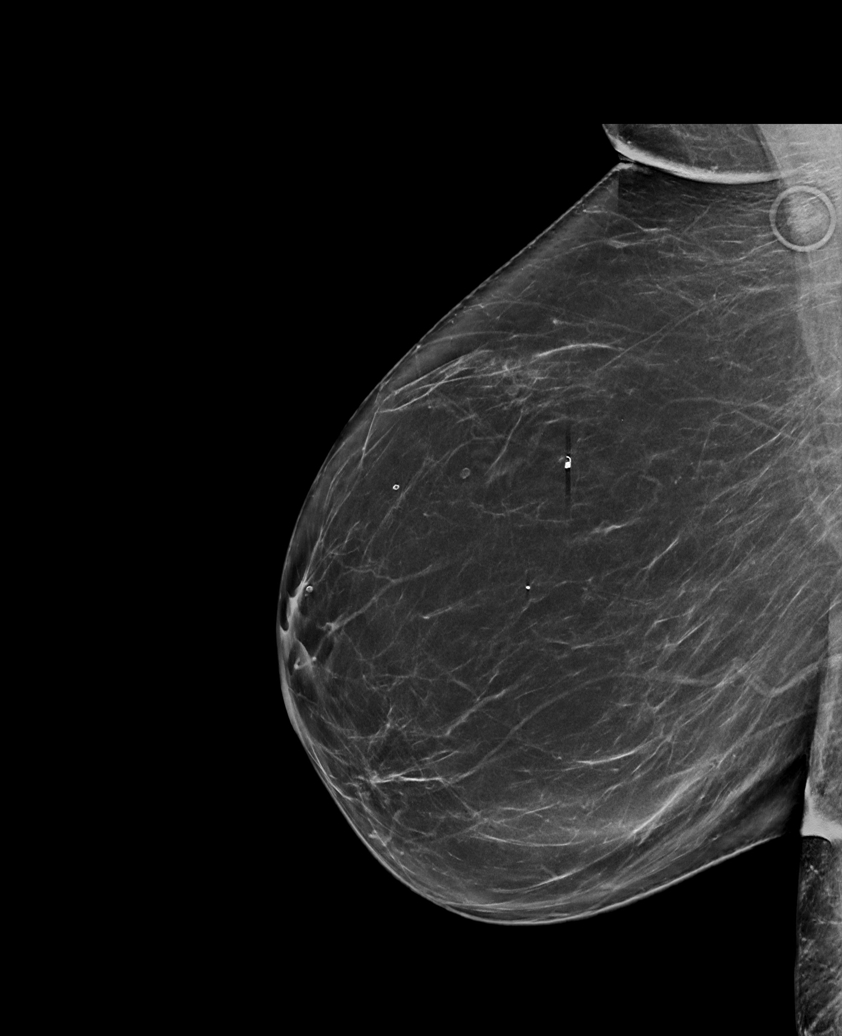

[R CC synth-2D]
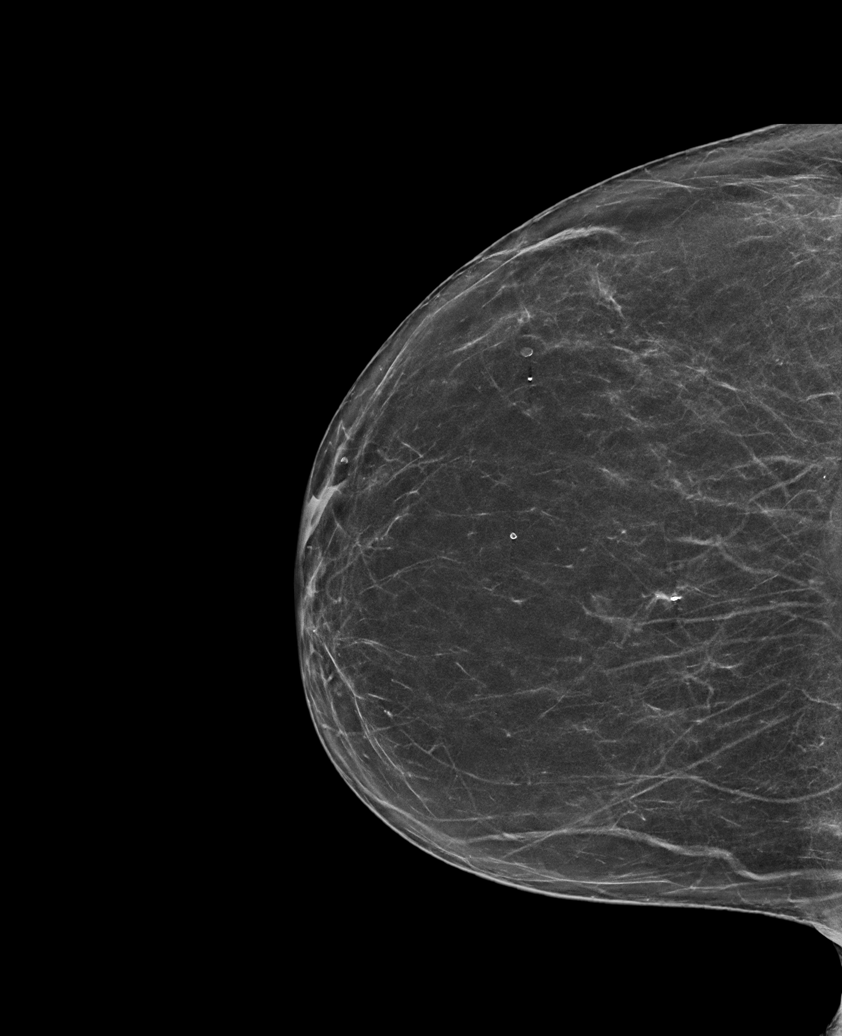

[R CC tomo · tomo slice 34/67.0]
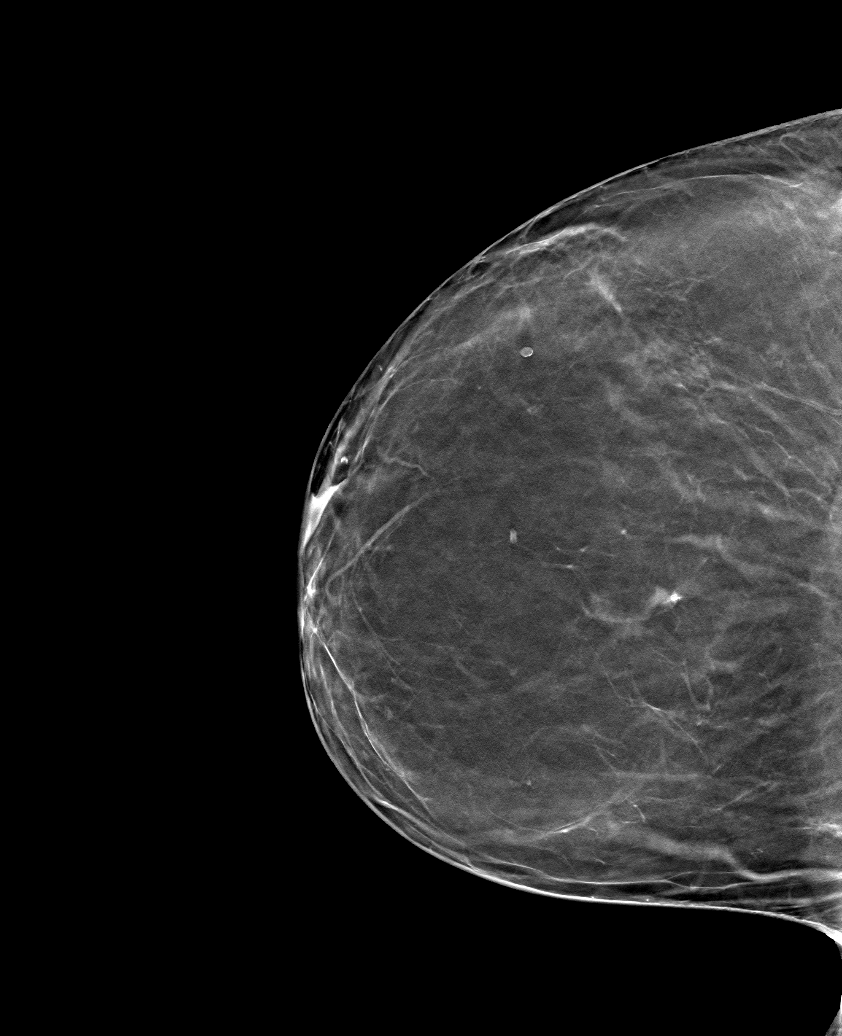

[6 of 25 positions shown; findings below may reference images not displayed]

ACR Breast Density Category b: There are scattered areas of
fibroglandular density.
FINDINGS: Expected surgical changes noted at the lumpectomy site in the medial
left breast. No suspicious calcifications, masses or areas of
distortion are seen in the bilateral breasts.
IMPRESSION: Expected surgical changes at the left breast lumpectomy site. No
mammographic evidence of malignancy in the bilateral breasts.

RECOMMENDATION:
Diagnostic mammogram is suggested in 1 year. (Code:[DW])

I have discussed the findings and recommendations with the patient.
If applicable, a reminder letter will be sent to the patient
regarding the next appointment.

BI-RADS CATEGORY  2: Benign.

## 2021-10-27 ENCOUNTER — Inpatient Hospital Stay (HOSPITAL_BASED_OUTPATIENT_CLINIC_OR_DEPARTMENT_OTHER): Payer: BC Managed Care – PPO | Admitting: Hematology

## 2021-10-27 ENCOUNTER — Other Ambulatory Visit: Payer: Self-pay

## 2021-10-27 ENCOUNTER — Inpatient Hospital Stay (HOSPITAL_COMMUNITY): Payer: BC Managed Care – PPO | Attending: Hematology

## 2021-10-27 DIAGNOSIS — N6311 Unspecified lump in the right breast, upper outer quadrant: Secondary | ICD-10-CM | POA: Insufficient documentation

## 2021-10-27 DIAGNOSIS — Z880 Allergy status to penicillin: Secondary | ICD-10-CM | POA: Insufficient documentation

## 2021-10-27 DIAGNOSIS — R059 Cough, unspecified: Secondary | ICD-10-CM | POA: Insufficient documentation

## 2021-10-27 DIAGNOSIS — Z803 Family history of malignant neoplasm of breast: Secondary | ICD-10-CM | POA: Insufficient documentation

## 2021-10-27 DIAGNOSIS — N6314 Unspecified lump in the right breast, lower inner quadrant: Secondary | ICD-10-CM | POA: Insufficient documentation

## 2021-10-27 DIAGNOSIS — R109 Unspecified abdominal pain: Secondary | ICD-10-CM | POA: Diagnosis not present

## 2021-10-27 DIAGNOSIS — C50812 Malignant neoplasm of overlapping sites of left female breast: Secondary | ICD-10-CM | POA: Insufficient documentation

## 2021-10-27 DIAGNOSIS — Z171 Estrogen receptor negative status [ER-]: Secondary | ICD-10-CM

## 2021-10-27 DIAGNOSIS — Z79899 Other long term (current) drug therapy: Secondary | ICD-10-CM | POA: Insufficient documentation

## 2021-10-27 DIAGNOSIS — K5900 Constipation, unspecified: Secondary | ICD-10-CM | POA: Diagnosis not present

## 2021-10-27 LAB — MAGNESIUM: Magnesium: 2 mg/dL (ref 1.7–2.4)

## 2021-10-27 LAB — CBC WITH DIFFERENTIAL/PLATELET
Abs Immature Granulocytes: 0.01 10*3/uL (ref 0.00–0.07)
Basophils Absolute: 0 10*3/uL (ref 0.0–0.1)
Basophils Relative: 0 %
Eosinophils Absolute: 0.2 10*3/uL (ref 0.0–0.5)
Eosinophils Relative: 3 %
HCT: 35.1 % — ABNORMAL LOW (ref 36.0–46.0)
Hemoglobin: 12.1 g/dL (ref 12.0–15.0)
Immature Granulocytes: 0 %
Lymphocytes Relative: 30 %
Lymphs Abs: 1.9 10*3/uL (ref 0.7–4.0)
MCH: 33.2 pg (ref 26.0–34.0)
MCHC: 34.5 g/dL (ref 30.0–36.0)
MCV: 96.2 fL (ref 80.0–100.0)
Monocytes Absolute: 0.5 10*3/uL (ref 0.1–1.0)
Monocytes Relative: 7 %
Neutro Abs: 3.9 10*3/uL (ref 1.7–7.7)
Neutrophils Relative %: 60 %
Platelets: 200 10*3/uL (ref 150–400)
RBC: 3.65 MIL/uL — ABNORMAL LOW (ref 3.87–5.11)
RDW: 13.1 % (ref 11.5–15.5)
WBC: 6.5 10*3/uL (ref 4.0–10.5)
nRBC: 0 % (ref 0.0–0.2)

## 2021-10-27 LAB — COMPREHENSIVE METABOLIC PANEL
ALT: 16 U/L (ref 0–44)
AST: 18 U/L (ref 15–41)
Albumin: 3.7 g/dL (ref 3.5–5.0)
Alkaline Phosphatase: 74 U/L (ref 38–126)
Anion gap: 7 (ref 5–15)
BUN: 14 mg/dL (ref 8–23)
CO2: 25 mmol/L (ref 22–32)
Calcium: 8.7 mg/dL — ABNORMAL LOW (ref 8.9–10.3)
Chloride: 104 mmol/L (ref 98–111)
Creatinine, Ser: 0.48 mg/dL (ref 0.44–1.00)
GFR, Estimated: >60 mL/min (ref >60)
Glucose, Bld: 91 mg/dL (ref 70–99)
Potassium: 3.4 mmol/L — ABNORMAL LOW (ref 3.5–5.1)
Sodium: 136 mmol/L (ref 135–145)
Total Bilirubin: 0.5 mg/dL (ref 0.3–1.2)
Total Protein: 6.2 g/dL — ABNORMAL LOW (ref 6.5–8.1)

## 2021-10-27 LAB — TSH: TSH: 1.318 u[IU]/mL (ref 0.350–4.500)

## 2021-10-27 MED ORDER — SODIUM CHLORIDE 0.9% FLUSH
10.0000 mL | Freq: Once | INTRAVENOUS | Status: AC
  Administered 2021-10-27: 10 mL via INTRAVENOUS

## 2021-10-27 MED ORDER — HEPARIN SOD (PORK) LOCK FLUSH 100 UNIT/ML IV SOLN
500.0000 [IU] | Freq: Once | INTRAVENOUS | Status: AC
  Administered 2021-10-27: 500 [IU] via INTRAVENOUS

## 2021-10-27 NOTE — Progress Notes (Signed)
Patients port flushed without difficulty.  Good blood return noted with no bruising or swelling noted at site.  Band aid applied.  VSS with discharge and left in satisfactory condition with no s/s of distress noted.   

## 2021-10-27 NOTE — Patient Instructions (Signed)
[place] at [facility] Discharge Instructions  You were seen and examined today by Dr. Delton Coombes. He reviewed your most recent labs and everything looks good. Please keep follow up appointment as scheduled in 4 months.   Thank you for choosing [place] at [facility] to provide your oncology and hematology care.  To afford each patient quality time with our provider, please arrive at least 15 minutes before your scheduled appointment time.   If you have a lab appointment with the [facility] please come in thru the Main Entrance and check in at the main information desk.  You need to re-schedule your appointment should you arrive 10 or more minutes late.  We strive to give you quality time with our providers, and arriving late affects you and other patients whose appointments are after yours.  Also, if you no show three or more times for appointments you may be dismissed from the clinic at the providers discretion.     Again, thank you for choosing [facility].  Our hope is that these requests will decrease the amount of time that you wait before being seen by our physicians.       _____________________________________________________________  Should you have questions after your visit to [facility], please contact our office at [phone] and follow the prompts.  Our office hours are 8:00 a.m. and 4:30 p.m. Monday - Friday.  Please note that voicemails left after 4:00 p.m. may not be returned until the following business day.  We are closed weekends and major holidays.  You do have access to a nurse 24-7, just call the main number to the clinic [phone] and do not press any options, hold on the line and a nurse will answer the phone.    For prescription refill requests, have your pharmacy contact our office and allow 72 hours.    Due to Covid, you will need to wear a mask upon entering the hospital. If you do  not have a mask, a mask will be given to you at the Main Entrance upon arrival. For doctor visits, patients may have 1 support person age 71 or older with them. For treatment visits, patients can not have anyone with them due to social distancing guidelines and our immunocompromised population.

## 2021-10-27 NOTE — Progress Notes (Signed)
[facility], [place] [address], [place] [address]   Patient Care Team: Virgina Norfolk, MD as PCP - General (Nurse Practitioner) Donnelly Angelica, RN as Oncology Nurse Navigator Pershing Proud, RN as Oncology Nurse Navigator Therese Sarah, RN as Oncology Nurse Navigator (Oncology)  SUMMARY OF ONCOLOGIC HISTORY: Oncology History  Malignant neoplasm of overlapping sites of left breast in female, estrogen receptor negative (HCC)  05/17/2020 Cancer Staging   Staging form: Breast, AJCC 8th Edition - Clinical stage from 05/17/2020: Stage IIB (cT2, cN0, cM0, G3, ER-, PR-, HER2-) - Signed by Loa Socks, NP on 05/26/2020    05/26/2020 Initial Diagnosis   Patient palpated a left breast mass for 5 months. Mammogram and US showed a 2.7cm mass at the 10:30 position, one left axillary lymph node with cortical thickening, calcifications in the lower inner left breast, and a 0.4cm group of calcifications in the right breast. Biopsy on 05/14/20 showed no malignancy in the right breast, and in the left breast, DCIS, high grade, ER/PR negative. 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    06/16/2020 -  Chemotherapy   Patient is on Treatment Plan : BREAST Keytruda q 21 days     09/22/2020 Genetic Testing   Negative genetic testing. No pathogenic variants identified on the [facility] Common Hereditary Cancers Panel. The report date is 09/22/2020.  The Common Hereditary Cancers Panel offered by [facility] includes sequencing and/or deletion duplication testing of the following 48 genes: APC, ATM, AXIN2, BARD1, BMPR1A, BRCA1, BRCA2, BRIP1, CDH1, CDKN2A (p14ARF), CDKN2A (p16INK4a), CKD4, CHEK2, CTNNA1, DICER1, EPCAM (Deletion/duplication testing only), GREM1 (promoter region deletion/duplication testing only), KIT, MEN1, MLH1, MSH2, MSH3, MSH6, MUTYH, NBN,  NF1, NHTL1, PALB2, PDGFRA, PMS2, POLD1, POLE, PTEN, RAD50, RAD51C, RAD51D, RNF43, SDHB, SDHC, SDHD, SMAD4, SMARCA4. STK11, TP53, TSC1, TSC2, and VHL.  The following genes were evaluated for sequence changes only: SDHA and HOXB13 c.251G>A variant only.     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 is a 71 y.o. female here today for follow up of her triple negative left breast cancer. Her last visit was on 07/29/2021.   Today she reports feeling good. She denies skin rash. 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 She denies new pains. She reports a productive cough following a influenza infection at the end of December 2022. She reports constipation over the past [redacted] week along with abdominal pain. She denies fevers and chills.   REVIEW OF SYSTEMS:   Review of Systems  Constitutional:  Negative for appetite change, chills, fatigue and fever.  Respiratory:  Positive for cough (productive).   Gastrointestinal:  Positive for abdominal pain and constipation.  Skin:  Negative for rash.  All other systems reviewed and are negative.  I have reviewed the past medical history, past surgical history, social history and family history with the patient and they are unchanged from previous note.   ALLERGIES:   is allergic to grapefruit extract, pseudoephedrine hcl, nickel, pineapple, and amoxicillin.   MEDICATIONS:  Current Outpatient Medications  Medication Sig Dispense Refill   amLODipine-benazepril (LOTREL) 5-20 MG capsule Take 1 capsule by mouth daily.     Ascorbic Acid (VITAMIN C) 1000 MG tablet Take 1,000 mg by mouth daily.     Bismuth  Subsalicylate (PEPTO-BISMOL PO) Take by mouth as needed.     cholecalciferol (VITAMIN D3) 25 MCG (1000 UNIT) tablet Take 1,000 Units by mouth daily.     fexofenadine (ALLEGRA) 180 MG tablet Take 180 mg by mouth daily.     fluconazole (DIFLUCAN) 100 MG tablet Take 1 tablet  (100 mg total) by mouth daily. 7 tablet 0   fluticasone (FLONASE) 50 MCG/ACT nasal spray Place into both nostrils daily.     ketoconazole (NIZORAL) 2 % cream Apply 1 application topically daily. 30 g 1   ketorolac (ACULAR) 0.5 % ophthalmic solution Place 1 drop into the right eye 4 (four) times daily.     Lactobacillus-Inulin (CULTURELLE DIGESTIVE DAILY) CAPS Take 1 capsule by mouth daily.     levothyroxine (SYNTHROID) 100 MCG tablet TAKE 1 TABLET BY MOUTH ONCE DAILY BEFORE BREAKFAST 30 tablet 6   lidocaine-prilocaine (EMLA) cream Apply small amount prior to port access 30 g 6   moxifloxacin (VIGAMOX) 0.5 % ophthalmic solution Place 1 drop into the right eye 4 (four) times daily.     omeprazole (PRILOSEC) 20 MG capsule Take 20 mg by mouth daily.     ondansetron (ZOFRAN) 8 MG tablet Take 1 tablet (8 mg total) by mouth 2 (two) times daily as needed. Start on the third day after chemotherapy. 30 tablet 1   prednisoLONE acetate (PRED FORTE) 1 % ophthalmic suspension Place 1 drop into the right eye 4 (four) times daily.     LORazepam (ATIVAN) 0.5 MG tablet Take 1 tablet (0.5 mg total) by mouth at bedtime as needed for sleep. (Patient not taking: Reported on 10/27/2021) 30 tablet 0   magic mouthwash SOLN Take 5 mLs by mouth 3 (three) times daily as needed for mouth pain. (Patient not taking: Reported on 10/27/2021) 450 mL 0   prochlorperazine (COMPAZINE) 10 MG tablet Take 1 tablet (10 mg total) by mouth every 6 (six) hours as needed (Nausea or vomiting). (Patient not taking: Reported on 10/27/2021) 30 tablet 1   No current facility-administered medications for this visit.   Facility-Administered Medications Ordered in Other Visits  Medication Dose Route Frequency Provider Last Rate Last Admin   sodium chloride flush (NS) 0.9 % injection 10 mL  10 mL Intracatheter PRN Ladene Artist, MD         PHYSICAL EXAMINATION: Performance status (ECOG): 1 - Symptomatic but completely ambulatory  There were no  vitals filed for this visit. Wt Readings from Last 3 Encounters:  10/27/21 164 lb (74.4 kg)  07/28/21 162 lb 4.8 oz (73.6 kg)  07/07/21 164 lb 12.8 oz (74.8 kg)   Physical Exam Vitals reviewed.  Constitutional:      Appearance: Normal appearance.  Cardiovascular:     Rate and Rhythm: Normal rate and regular rhythm.     Pulses: Normal pulses.     Heart sounds: Normal heart sounds.  Pulmonary:     Effort: Pulmonary effort is normal.     Breath sounds: Normal breath sounds.  Musculoskeletal:     Right lower leg: No edema.     Left lower leg: No edema.  Neurological:     General: No focal deficit present.     Mental Status: She is alert and oriented to person, place, and time.  Psychiatric:        Mood and Affect: Mood normal.        Behavior: Behavior normal.    Breast Exam Chaperone: Alda Ponder     LABORATORY DATA:  I have reviewed the data as listed CMP Latest Ref Rng & Units 10/27/2021 09/01/2021 07/28/2021  Glucose 70 - 99 mg/dL 91 93 93  BUN 8 - 23 mg/dL $Remove'14 15 9  'Csuceih$ Creatinine 0.44 - 1.00 mg/dL 0.48 0.62 0.68  Sodium 135 - 145 mmol/L 136 137 134(L)  Potassium 3.5 - 5.1 mmol/L 3.4(L) 3.7 3.8  Chloride 98 - 111 mmol/L 104 104 100  CO2 22 - 32 mmol/L $RemoveB'25 26 26  'dGVQhErl$ Calcium 8.9 - 10.3 mg/dL 8.7(L) 9.3 8.8(L)  Total Protein 6.5 - 8.1 g/dL 6.2(L) 6.5 6.7  Total Bilirubin 0.3 - 1.2 mg/dL 0.5 0.5 1.1  Alkaline Phos 38 - 126 U/L 74 78 82  AST 15 - 41 U/L $Remo'18 25 22  'LWepO$ ALT 0 - 44 U/L $Remo'16 21 20   'TmBYQ$ Lab Results  Component Value Date   CAN153 22.6 09/01/2021   Lab Results  Component Value Date   WBC 6.5 10/27/2021   HGB 12.1 10/27/2021   HCT 35.1 (L) 10/27/2021   MCV 96.2 10/27/2021   PLT 200 10/27/2021   NEUTROABS 3.9 10/27/2021    ASSESSMENT:  1.  Stage II (T2N0) left breast TNBC: -Felt left breast mass for 5 months. -Mammogram on 05/07/2020 with suspicious mass in the 10:30 o'clock position in the left breast.  Suspicious calcifications in the anterior lower inner quadrant  of the left breast. -MRI on 06/05/2020 shows left breast mass measuring 3.8 x 3.3 x 3.2 cm in the upper inner quadrant.  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  No suspicious lymphadenopathy.  No MRI evidence of malignancy on the right. -Left breast upper inner quadrant biopsy on 05/14/2020 showed DCIS with calcifications.  Right breast biopsy shows fibroadenomatoid nodule with calcifications.  ER/PR was negative. -Left breast core needle biopsy at 10:30 position shows invasive ductal carcinoma.  ER/PR/HER-2 was negative.  Ki-67 85%.  Left lymph node biopsy was reactive. -Patient was evaluated by Dr. [name] and Dr. Georgette Dover. -She received first cycle of chemotherapy with Adriamycin and cyclophosphamide with pembrolizumab (keynote-522 trial) added for improved PCR by about 15%. -Cycle 1 of Adriamycin, cyclophosphamide and pembrolizumab on 06/16/2020. - Left lumpectomy and SLNB on 01/12/2021, YPT0, ypN0. - Adjuvant Keytruda 9 cycles from 02/15/2021 through 09/01/2021.   2.  Family history: -Paternal cousin and second cousin had breast cancer.  Paternal grandmother also had some type of cancer. -Another paternal cousin died of pancreatic cancer.  Another paternal cousin had leukemia.   PLAN:  1.  Triple negative left breast cancer: - She does not report any local signs of recurrence. - Reviewed mammogram from 10/14/2021, BI-RADS Category 2. - Reviewed labs from today which shows normal LFTs and CBC. - RTC 4 months with repeat labs and physical exam.     2.  Family history: - Germline mutation testing was negative for BRCA 1/2.   3.  High risk drug monitoring: - Continue Synthroid 100 mcg daily.  TSH is 1.3.    Breast Cancer therapy associated bone loss: I have recommended calcium, Vitamin D and weight bearing exercises.  Orders placed this encounter:  No orders of the defined types were placed in this encounter.   The patient has a good understanding of the  overall plan. She agrees with it. She will call with any problems that may develop before the next visit here.  Derek Jack, MD [place] [phone]   I, Thana Ates, am acting as a scribe for Dr. Derek Jack.  I, Derek Jack MD, have reviewed the above documentation for accuracy and completeness, and I agree with the above.

## 2021-10-27 NOTE — Patient Instructions (Signed)
[facility]  Discharge Instructions: Thank you for choosing [facility] to provide your oncology and hematology care.  If you have a lab appointment with the Cancer Center, please come in thru the Main Entrance and check in at the main information desk.  Wear comfortable clothing and clothing appropriate for easy access to any Portacath or PICC line.   We strive to give you quality time with your provider. You may need to reschedule your appointment if you arrive late (15 or more minutes).  Arriving late affects you and other patients whose appointments are after yours.  Also, if you miss three or more appointments without notifying the office, you may be dismissed from the clinic at the provider's discretion.      For prescription refill requests, have your pharmacy contact our office and allow 72 hours for refills to be completed.    Today you received the following chemotherapy and/or immunotherapy agents port flush      To help prevent nausea and vomiting after your treatment, we encourage you to take your nausea medication as directed.  BELOW ARE SYMPTOMS THAT SHOULD BE REPORTED IMMEDIATELY: *FEVER GREATER THAN 100.4 F (38 C) OR HIGHER *CHILLS OR SWEATING *NAUSEA AND VOMITING THAT IS NOT CONTROLLED WITH YOUR NAUSEA MEDICATION *UNUSUAL SHORTNESS OF BREATH *UNUSUAL BRUISING OR BLEEDING *URINARY PROBLEMS (pain or burning when urinating, or frequent urination) *BOWEL PROBLEMS (unusual diarrhea, constipation, pain near the anus) TENDERNESS IN MOUTH AND THROAT WITH OR WITHOUT PRESENCE OF ULCERS (sore throat, sores in mouth, or a toothache) UNUSUAL RASH, SWELLING OR PAIN  UNUSUAL VAGINAL DISCHARGE OR ITCHING   Items with * indicate a potential emergency and should be followed up as soon as possible or go to the Emergency Department if any problems should occur.  Please show the CHEMOTHERAPY ALERT CARD or IMMUNOTHERAPY ALERT CARD at check-in to the Emergency  Department and triage nurse.  Should you have questions after your visit or need to cancel or reschedule your appointment, please contact [facility] [phone]  and follow the prompts.  Office hours are 8:00 a.m. to 4:30 p.m. Monday - Friday. Please note that voicemails left after 4:00 p.m. may not be returned until the following business day.  We are closed weekends and major holidays. You have access to a nurse at all times for urgent questions. Please call the main number to the clinic [phone] and follow the prompts.  For any non-urgent questions, you may also contact your provider using MyChart. We now offer e-Visits for anyone 18 and older to request care online for non-urgent symptoms. For details visit [email or web address].   Also download the MyChart app! Go to the app store, search "MyChart", open the app, select [facility], and log in with your MyChart username and password.  Due to Covid, a mask is required upon entering the hospital/clinic. If you do not have a mask, one will be given to you upon arrival. For doctor visits, patients may have 1 support person aged 18 or older with them. For treatment visits, patients cannot have anyone with them due to current Covid guidelines and our immunocompromised population.  

## 2021-10-28 LAB — CANCER ANTIGEN 15-3: CA 15-3: 21.8 U/mL (ref 0.0–25.0)

## 2021-10-28 LAB — CANCER ANTIGEN 27.29: CA 27.29: 16.8 U/mL (ref 0.0–38.6)

## 2022-02-16 ENCOUNTER — Inpatient Hospital Stay (HOSPITAL_COMMUNITY): Payer: BC Managed Care – PPO | Attending: Hematology | Admitting: Hematology

## 2022-02-16 ENCOUNTER — Inpatient Hospital Stay (HOSPITAL_COMMUNITY): Payer: BC Managed Care – PPO

## 2022-02-16 DIAGNOSIS — Z79899 Other long term (current) drug therapy: Secondary | ICD-10-CM | POA: Diagnosis not present

## 2022-02-16 DIAGNOSIS — R978 Other abnormal tumor markers: Secondary | ICD-10-CM | POA: Diagnosis not present

## 2022-02-16 DIAGNOSIS — C50812 Malignant neoplasm of overlapping sites of left female breast: Secondary | ICD-10-CM

## 2022-02-16 DIAGNOSIS — Z171 Estrogen receptor negative status [ER-]: Secondary | ICD-10-CM | POA: Insufficient documentation

## 2022-02-16 LAB — CBC WITH DIFFERENTIAL/PLATELET
Abs Immature Granulocytes: 0.01 10*3/uL (ref 0.00–0.07)
Basophils Absolute: 0.1 10*3/uL (ref 0.0–0.1)
Basophils Relative: 1 %
Eosinophils Absolute: 0.1 10*3/uL (ref 0.0–0.5)
Eosinophils Relative: 2 %
HCT: 40.1 % (ref 36.0–46.0)
Hemoglobin: 13.5 g/dL (ref 12.0–15.0)
Immature Granulocytes: 0 %
Lymphocytes Relative: 45 %
Lymphs Abs: 2.2 10*3/uL (ref 0.7–4.0)
MCH: 31.4 pg (ref 26.0–34.0)
MCHC: 33.7 g/dL (ref 30.0–36.0)
MCV: 93.3 fL (ref 80.0–100.0)
Monocytes Absolute: 0.4 10*3/uL (ref 0.1–1.0)
Monocytes Relative: 8 %
Neutro Abs: 2.2 10*3/uL (ref 1.7–7.7)
Neutrophils Relative %: 44 %
Platelets: 206 10*3/uL (ref 150–400)
RBC: 4.3 MIL/uL (ref 3.87–5.11)
RDW: 13 % (ref 11.5–15.5)
WBC: 4.9 10*3/uL (ref 4.0–10.5)
nRBC: 0 % (ref 0.0–0.2)

## 2022-02-16 LAB — COMPREHENSIVE METABOLIC PANEL
ALT: 23 U/L (ref 0–44)
AST: 24 U/L (ref 15–41)
Albumin: 4.1 g/dL (ref 3.5–5.0)
Alkaline Phosphatase: 86 U/L (ref 38–126)
Anion gap: 6 (ref 5–15)
BUN: 12 mg/dL (ref 8–23)
CO2: 26 mmol/L (ref 22–32)
Calcium: 9.4 mg/dL (ref 8.9–10.3)
Chloride: 106 mmol/L (ref 98–111)
Creatinine, Ser: 0.68 mg/dL (ref 0.44–1.00)
GFR, Estimated: >60 mL/min (ref >60)
Glucose, Bld: 97 mg/dL (ref 70–99)
Potassium: 3.9 mmol/L (ref 3.5–5.1)
Sodium: 138 mmol/L (ref 135–145)
Total Bilirubin: 0.9 mg/dL (ref 0.3–1.2)
Total Protein: 7.1 g/dL (ref 6.5–8.1)

## 2022-02-16 LAB — TSH: TSH: 0.872 u[IU]/mL (ref 0.350–4.500)

## 2022-02-16 MED ORDER — SODIUM CHLORIDE 0.9% FLUSH
10.0000 mL | Freq: Once | INTRAVENOUS | Status: AC
  Administered 2022-02-16: 10 mL via INTRAVENOUS

## 2022-02-16 MED ORDER — HEPARIN SOD (PORK) LOCK FLUSH 100 UNIT/ML IV SOLN
500.0000 [IU] | Freq: Once | INTRAVENOUS | Status: AC
  Administered 2022-02-16: 500 [IU] via INTRAVENOUS

## 2022-02-16 NOTE — Patient Instructions (Addendum)
[place] at [facility] ?Discharge Instructions ? ?You were seen and examined today by Dr. Delton Coombes. ? ?Dr. Delton Coombes discussed your most recent lab work and everything looks good. ? ?Continue taking same dose of synthroid. ? ?Follow-up as scheduled in 6 months. ? ? ? ? ?Thank you for choosing [place] at [facility] to provide your oncology and hematology care.  To afford each patient quality time with our provider, please arrive at least 15 minutes before your scheduled appointment time.  ? ?If you have a lab appointment with the [place] please come in thru the Main Entrance and check in at the main information desk. ? ?You need to re-schedule your appointment should you arrive 10 or more minutes late.  We strive to give you quality time with our providers, and arriving late affects you and other patients whose appointments are after yours.  Also, if you no show three or more times for appointments you may be dismissed from the clinic at the providers discretion.     ?Again, thank you for choosing [facility].  Our hope is that these requests will decrease the amount of time that you wait before being seen by our physicians.       ?_____________________________________________________________ ? ?Should you have questions after your visit to [facility], please contact our office at [phone] and follow the prompts.  Our office hours are 8:00 a.m. and 4:30 p.m. Monday - Friday.  Please note that voicemails left after 4:00 p.m. may not be returned until the following business day.  We are closed weekends and major holidays.  You do have access to a nurse 24-7, just call the main number to the clinic [phone] and do not press any options, hold on the line and a nurse will answer the phone.   ? ?For prescription refill requests, have your pharmacy contact our office and allow 72 hours.   ? ?Due to Covid, you will need to wear  a mask upon entering the hospital. If you do not have a mask, a mask will be given to you at the Main Entrance upon arrival. For doctor visits, patients may have 1 support person age 70 or older with them. For treatment visits, patients can not have anyone with them due to social distancing guidelines and our immunocompromised population.  ? ?  ?

## 2022-02-16 NOTE — Progress Notes (Signed)
[facility] [address] [place] [address]   Patient Care Team: Roderic Scarce, MD as PCP - General (Nurse Practitioner) Rockwell Germany, RN as Oncology Nurse Navigator Mauro Kaufmann, RN as Oncology Nurse Navigator Brien Mates, RN as Oncology Nurse Navigator (Oncology)  SUMMARY OF ONCOLOGIC HISTORY: Oncology History  Malignant neoplasm of overlapping sites of left breast in female, estrogen receptor negative ([place])  05/17/2020 Cancer Staging   Staging form: Breast, AJCC 8th Edition - Clinical stage from 05/17/2020: Stage IIB (cT2, cN0, cM0, G3, ER-, PR-, HER2-) - Signed by Gardenia Phlegm, NP on 05/26/2020    05/26/2020 Initial Diagnosis   Patient palpated a left breast mass for 5 months. Mammogram and US showed a 2.7cm mass at the 10:30 position, one left axillary lymph node with cortical thickening, calcifications in the lower inner left breast, and a 0.4cm group of calcifications in the right breast. Biopsy on 05/14/20 showed no malignancy in the right breast, and in the left breast, DCIS, high grade, ER/PR negative. 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    06/16/2020 -  Chemotherapy   Patient is on Treatment Plan : BREAST Keytruda q 21 days       09/22/2020 Genetic Testing   Negative genetic testing. No pathogenic variants identified on the [facility] Common Hereditary Cancers Panel. The report date is 09/22/2020.  The Common Hereditary Cancers Panel offered by [facility] includes sequencing and/or deletion duplication testing of the following 48 genes: APC, ATM, AXIN2, BARD1, BMPR1A, BRCA1, BRCA2, BRIP1, CDH1, CDKN2A (p14ARF), CDKN2A (p16INK4a), CKD4, CHEK2, CTNNA1, DICER1, EPCAM (Deletion/duplication testing only), GREM1 (promoter region deletion/duplication testing only), KIT, MEN1, MLH1, MSH2, MSH3, MSH6, MUTYH,  NBN, NF1, NHTL1, PALB2, PDGFRA, PMS2, POLD1, POLE, PTEN, RAD50, RAD51C, RAD51D, RNF43, SDHB, SDHC, SDHD, SMAD4, SMARCA4. STK11, TP53, TSC1, TSC2, and VHL.  The following genes were evaluated for sequence changes only: SDHA and HOXB13 c.251G>A variant only.     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 female here today for follow up of Jo Mills triple negative left breast cancer. Jo Mills last visit was on 10/27/2021.   Today Jo Mills reports feeling good. Jo Mills continues to take Synthroid. Jo Mills denies new pains.   REVIEW OF SYSTEMS:   Review of Systems  Constitutional:  Negative for appetite change and fatigue.  Musculoskeletal:  Positive for arthralgias.  Neurological:  Positive for numbness.  Psychiatric/Behavioral:  Positive for depression. The patient is nervous/anxious.   All other systems reviewed and are negative.  I have reviewed the past medical history, past surgical history, social history and family history with the patient and they are unchanged from previous note.   ALLERGIES:   is allergic to grapefruit extract, pseudoephedrine hcl, nickel, pineapple, and amoxicillin.   MEDICATIONS:  Current Outpatient Medications  Medication Sig Dispense Refill   amLODipine-benazepril (LOTREL) 5-20 MG capsule Take 1 capsule by mouth daily.     Ascorbic Acid (VITAMIN C) 1000 MG tablet Take 1,000 mg by mouth daily.     Bismuth Subsalicylate (PEPTO-BISMOL PO) Take by mouth as needed.     cholecalciferol (VITAMIN D3) 25 MCG (1000 UNIT) tablet Take 1,000 Units by mouth daily.     fexofenadine (ALLEGRA) 180 MG tablet Take 180 mg by mouth daily.  fluconazole (DIFLUCAN) 100 MG tablet Take 1 tablet (100 mg total) by mouth daily. 7 tablet 0   fluticasone (FLONASE) 50 MCG/ACT nasal spray Place into both nostrils daily.     ketoconazole (NIZORAL) 2 % cream Apply 1 application topically daily. 30 g 1   ketorolac (ACULAR) 0.5 %  ophthalmic solution Place 1 drop into the right eye 4 (four) times daily.     Lactobacillus-Inulin (CULTURELLE DIGESTIVE DAILY) CAPS Take 1 capsule by mouth daily.     levothyroxine (SYNTHROID) 100 MCG tablet TAKE 1 TABLET BY MOUTH ONCE DAILY BEFORE BREAKFAST 30 tablet 6   lidocaine-prilocaine (EMLA) cream Apply small amount prior to port access 30 g 6   LORazepam (ATIVAN) 0.5 MG tablet Take 1 tablet (0.5 mg total) by mouth at bedtime as needed for sleep. (Patient not taking: Reported on 10/27/2021) 30 tablet 0   magic mouthwash SOLN Take 5 mLs by mouth 3 (three) times daily as needed for mouth pain. (Patient not taking: Reported on 10/27/2021) 450 mL 0   moxifloxacin (VIGAMOX) 0.5 % ophthalmic solution Place 1 drop into the right eye 4 (four) times daily.     omeprazole (PRILOSEC) 20 MG capsule Take 20 mg by mouth daily.     ondansetron (ZOFRAN) 8 MG tablet Take 1 tablet (8 mg total) by mouth 2 (two) times daily as needed. Start on the third day after chemotherapy. 30 tablet 1   prednisoLONE acetate (PRED FORTE) 1 % ophthalmic suspension Place 1 drop into the right eye 4 (four) times daily.     prochlorperazine (COMPAZINE) 10 MG tablet Take 1 tablet (10 mg total) by mouth every 6 (six) hours as needed (Nausea or vomiting). (Patient not taking: Reported on 10/27/2021) 30 tablet 1   No current facility-administered medications for this visit.   Facility-Administered Medications Ordered in Other Visits  Medication Dose Route Frequency Provider Last Rate Last Admin   sodium chloride flush (NS) 0.9 % injection 10 mL  10 mL Intracatheter PRN Ladell Pier, MD         PHYSICAL EXAMINATION: Performance status (ECOG): 1 - Symptomatic but completely ambulatory  There were no vitals filed for this visit. Wt Readings from Last 3 Encounters:  02/16/22 161 lb 13.1 oz (73.4 kg)  10/27/21 164 lb (74.4 kg)  07/28/21 162 lb 4.8 oz (73.6 kg)   Physical Exam Vitals reviewed.  Constitutional:       Appearance: Normal appearance.  Cardiovascular:     Rate and Rhythm: Normal rate and regular rhythm.     Pulses: Normal pulses.     Heart sounds: Normal heart sounds.  Pulmonary:     Effort: Pulmonary effort is normal.     Breath sounds: Normal breath sounds.  Chest:  Breasts:    Right: No swelling, bleeding, inverted nipple, mass, nipple discharge, skin change or tenderness.     Left: No swelling, bleeding, inverted nipple, mass, nipple discharge, skin change (LIQ lumpectomy scar around areola) or tenderness.  Musculoskeletal:     Right lower leg: No edema.     Left lower leg: No edema.  Lymphadenopathy:     Upper Body:     Right upper body: No supraclavicular, axillary or pectoral adenopathy.     Left upper body: No supraclavicular, axillary or pectoral adenopathy.  Neurological:     General: No focal deficit present.     Mental Status: Jo Mills is alert and oriented to person, place, and time.  Psychiatric:  Mood and Affect: Mood normal.        Behavior: Behavior normal.    Breast Exam Chaperone: Thana Ates     LABORATORY DATA:  I have reviewed the data as listed    Latest Ref Rng & Units 10/27/2021   12:54 PM 09/01/2021   12:48 PM 07/28/2021   12:35 PM  CMP  Glucose 70 - 99 mg/dL 91   93   93    BUN 8 - 23 mg/dL _0 Creatinine 0.44 - 1.00 mg/dL 0.48   0.62   0.68    Sodium 135 - 145 mmol/L 136   137   134    Potassium 3.5 - 5.1 mmol/L 3.4   3.7   3.8    Chloride 98 - 111 mmol/L 104   104   100    CO2 22 - 32 mmol/L _1 Calcium 8.9 - 10.3 mg/dL 8.7   9.3   8.8    Total Protein 6.5 - 8.1 g/dL 6.2   6.5   6.7    Total Bilirubin 0.3 - 1.2 mg/dL 0.5   0.5   1.1    Alkaline Phos 38 - 126 U/L 74   78   82    AST 15 - 41 U/L _2 ALT 0 - 44 U/L _3 Lab Results  Component Value Date   CAN153 21.8 10/27/2021   CAN153 22.6 09/01/2021   Lab Results  Component Value Date   WBC 4.9 02/16/2022   HGB 13.5 02/16/2022    HCT 40.1 02/16/2022   MCV 93.3 02/16/2022   PLT 206 02/16/2022   NEUTROABS 2.2 02/16/2022    ASSESSMENT:  1.  Stage II (T2N0) left breast TNBC: -Felt left breast mass for 5 months. -Mammogram on 05/07/2020 with suspicious mass in the 10:30 o'clock position in the left breast.  Suspicious calcifications in the anterior lower inner quadrant of the left breast. -MRI on 06/05/2020 shows left breast mass measuring 3.8 x 3.3 x 3.2 cm in the upper inner quadrant.  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  No suspicious lymphadenopathy.  No MRI evidence of malignancy on the right. -Left breast upper inner quadrant biopsy on 05/14/2020 showed DCIS with calcifications.  Right breast biopsy shows fibroadenomatoid nodule with calcifications.  ER/PR was negative. -Left breast core needle biopsy at 10:30 position shows invasive ductal carcinoma.  ER/PR/Jo Mills-2 was negative.  Ki-67 85%.  Left lymph node biopsy was reactive. -Patient was evaluated by Dr. [name] and Dr. Georgette Dover. -Jo Mills received first cycle of chemotherapy with Adriamycin and cyclophosphamide with pembrolizumab (keynote-522 trial) added for improved PCR by about 15%. -Cycle 1 of Adriamycin, cyclophosphamide and pembrolizumab on 06/16/2020. - Left lumpectomy and SLNB on 01/12/2021, YPT0, ypN0. - Adjuvant Keytruda 9 cycles from 02/15/2021 through 09/01/2021.   2.  Family history: -Paternal cousin and second cousin had breast cancer.  Paternal grandmother also had some type of cancer. -Another paternal cousin died of pancreatic cancer.  Another paternal cousin had leukemia.   PLAN:  1.  Triple negative left breast cancer: - Mammogram from 10/14/2021 was BI-RADS Category 2. - Physical examination today did not reveal any palpable masses or adenopathy. - Reviewed labs from 02/16/2022 which shows normal LFTs and CBC.  CA 15-3 was slightly elevated  at 24 with a normal CA 27-29. - We will schedule Jo Mills for mammogram on 10/14/2022. -  RTC 6 months for follow-up with repeat labs and tumor markers.     2.  Family history: - Germline mutation testing was negative for BRCA 1 and 2.   3.  High risk drug monitoring: - Continue Synthroid 100 mcg daily.  TSH is 0.8.  Breast Cancer therapy associated bone loss: I have recommended calcium, Vitamin D and weight bearing exercises.  Orders placed this encounter:  No orders of the defined types were placed in this encounter.   The patient has a good understanding of the overall plan. Jo Mills agrees with it. Jo Mills will call with any problems that may develop before the next visit here.  Derek Jack, MD [facility] [phone]   I, Thana Ates, am acting as a scribe for Dr. Derek Jack.  I, Derek Jack MD, have reviewed the above documentation for accuracy and completeness, and I agree with the above.

## 2022-02-17 LAB — CANCER ANTIGEN 27.29: CA 27.29: 25.4 U/mL (ref 0.0–38.6)

## 2022-02-18 LAB — CANCER ANTIGEN 15-3: CA 15-3: 26.4 U/mL — ABNORMAL HIGH (ref 0.0–25.0)

## 2022-03-20 ENCOUNTER — Ambulatory Visit: Payer: Self-pay | Admitting: Surgery

## 2022-03-24 ENCOUNTER — Encounter: Payer: Self-pay | Admitting: Hematology and Oncology

## 2022-03-24 ENCOUNTER — Encounter (HOSPITAL_COMMUNITY): Payer: Self-pay | Admitting: Hematology

## 2022-04-08 ENCOUNTER — Other Ambulatory Visit: Payer: Self-pay | Admitting: Nurse Practitioner

## 2022-04-12 ENCOUNTER — Other Ambulatory Visit (HOSPITAL_COMMUNITY): Payer: Self-pay | Admitting: Hematology

## 2022-04-20 ENCOUNTER — Encounter (HOSPITAL_COMMUNITY): Payer: Self-pay

## 2022-09-03 ENCOUNTER — Other Ambulatory Visit (HOSPITAL_COMMUNITY): Payer: Self-pay | Admitting: Hematology

## 2022-10-17 ENCOUNTER — Other Ambulatory Visit: Payer: BC Managed Care – PPO

## 2022-10-17 ENCOUNTER — Inpatient Hospital Stay: Payer: BC Managed Care – PPO

## 2022-10-17 ENCOUNTER — Ambulatory Visit
Admission: RE | Admit: 2022-10-17 | Discharge: 2022-10-17 | Disposition: A | Discharge status: Home / Self Care | Payer: BC Managed Care – PPO | Source: Ambulatory Visit | Attending: Hematology | Admitting: Hematology

## 2022-10-17 ENCOUNTER — Inpatient Hospital Stay: Payer: BC Managed Care – PPO | Attending: Hematology

## 2022-10-17 DIAGNOSIS — C50812 Malignant neoplasm of overlapping sites of left female breast: Secondary | ICD-10-CM | POA: Diagnosis present

## 2022-10-17 DIAGNOSIS — Z880 Allergy status to penicillin: Secondary | ICD-10-CM | POA: Diagnosis not present

## 2022-10-17 DIAGNOSIS — Z79890 Hormone replacement therapy (postmenopausal): Secondary | ICD-10-CM | POA: Insufficient documentation

## 2022-10-17 DIAGNOSIS — Z171 Estrogen receptor negative status [ER-]: Secondary | ICD-10-CM | POA: Insufficient documentation

## 2022-10-17 DIAGNOSIS — R200 Anesthesia of skin: Secondary | ICD-10-CM | POA: Diagnosis not present

## 2022-10-17 DIAGNOSIS — N6311 Unspecified lump in the right breast, upper outer quadrant: Secondary | ICD-10-CM | POA: Insufficient documentation

## 2022-10-17 DIAGNOSIS — G479 Sleep disorder, unspecified: Secondary | ICD-10-CM | POA: Insufficient documentation

## 2022-10-17 DIAGNOSIS — Z79899 Other long term (current) drug therapy: Secondary | ICD-10-CM | POA: Diagnosis not present

## 2022-10-17 DIAGNOSIS — N6314 Unspecified lump in the right breast, lower inner quadrant: Secondary | ICD-10-CM | POA: Insufficient documentation

## 2022-10-17 LAB — CBC WITH DIFFERENTIAL/PLATELET
Abs Immature Granulocytes: 0.02 10*3/uL (ref 0.00–0.07)
Basophils Absolute: 0.1 10*3/uL (ref 0.0–0.1)
Basophils Relative: 1 %
Eosinophils Absolute: 0.2 10*3/uL (ref 0.0–0.5)
Eosinophils Relative: 2 %
HCT: 42.1 % (ref 36.0–46.0)
Hemoglobin: 14.2 g/dL (ref 12.0–15.0)
Immature Granulocytes: 0 %
Lymphocytes Relative: 39 %
Lymphs Abs: 2.4 10*3/uL (ref 0.7–4.0)
MCH: 32.3 pg (ref 26.0–34.0)
MCHC: 33.7 g/dL (ref 30.0–36.0)
MCV: 95.7 fL (ref 80.0–100.0)
Monocytes Absolute: 0.4 10*3/uL (ref 0.1–1.0)
Monocytes Relative: 6 %
Neutro Abs: 3.2 10*3/uL (ref 1.7–7.7)
Neutrophils Relative %: 52 %
Platelets: 247 10*3/uL (ref 150–400)
RBC: 4.4 MIL/uL (ref 3.87–5.11)
RDW: 13.5 % (ref 11.5–15.5)
WBC: 6.3 10*3/uL (ref 4.0–10.5)
nRBC: 0 % (ref 0.0–0.2)

## 2022-10-17 LAB — COMPREHENSIVE METABOLIC PANEL
ALT: 24 U/L (ref 0–44)
AST: 24 U/L (ref 15–41)
Albumin: 4.1 g/dL (ref 3.5–5.0)
Alkaline Phosphatase: 87 U/L (ref 38–126)
Anion gap: 9 (ref 5–15)
BUN: 10 mg/dL (ref 8–23)
CO2: 27 mmol/L (ref 22–32)
Calcium: 9.3 mg/dL (ref 8.9–10.3)
Chloride: 100 mmol/L (ref 98–111)
Creatinine, Ser: 0.69 mg/dL (ref 0.44–1.00)
GFR, Estimated: >60 mL/min (ref >60)
Glucose, Bld: 91 mg/dL (ref 70–99)
Potassium: 3.5 mmol/L (ref 3.5–5.1)
Sodium: 136 mmol/L (ref 135–145)
Total Bilirubin: 0.7 mg/dL (ref 0.3–1.2)
Total Protein: 7.2 g/dL (ref 6.5–8.1)

## 2022-10-17 LAB — TSH: TSH: 6.955 u[IU]/mL — ABNORMAL HIGH (ref 0.350–4.500)

## 2022-10-18 LAB — CANCER ANTIGEN 27.29: CA 27.29: 29 U/mL (ref 0.0–38.6)

## 2022-10-19 ENCOUNTER — Other Ambulatory Visit (HOSPITAL_COMMUNITY): Payer: Self-pay | Admitting: Hematology

## 2022-10-19 LAB — CANCER ANTIGEN 15-3: CA 15-3: 28.4 U/mL — ABNORMAL HIGH (ref 0.0–25.0)

## 2022-10-30 ENCOUNTER — Ambulatory Visit: Payer: BC Managed Care – PPO | Admitting: Hematology

## 2022-11-07 ENCOUNTER — Ambulatory Visit: Payer: BC Managed Care – PPO | Admitting: Hematology

## 2022-11-08 ENCOUNTER — Inpatient Hospital Stay (HOSPITAL_BASED_OUTPATIENT_CLINIC_OR_DEPARTMENT_OTHER): Payer: BC Managed Care – PPO | Admitting: Hematology

## 2022-11-08 VITALS — BP 136/88 | HR 80 | Temp 98.1°F | Resp 16 | Wt 162.1 lb

## 2022-11-08 DIAGNOSIS — C50812 Malignant neoplasm of overlapping sites of left female breast: Secondary | ICD-10-CM

## 2022-11-08 DIAGNOSIS — Z171 Estrogen receptor negative status [ER-]: Secondary | ICD-10-CM

## 2022-11-08 NOTE — Patient Instructions (Addendum)
[facility] at [facility] Discharge Instructions   You were seen and examined today by Dr. Delton Coombes.  He reviewed the results of your lab work which are mostly normal/stable. Your thyroid stimulating hormone is elevated. Continue taking Synthroid (levothyroxine) 100 mcg daily on an empty stomach at least 30 minutes before breakfast.   We will see you back in 3 months. We will repeat lab work prior to this visit.    Thank you for choosing [facility] at [facility] to provide your oncology and hematology care.  To afford each patient quality time with our provider, please arrive at least 15 minutes before your scheduled appointment time.   If you have a lab appointment with the [facility] please come in thru the Main Entrance and check in at the main information desk.  You need to re-schedule your appointment should you arrive 10 or more minutes late.  We strive to give you quality time with our providers, and arriving late affects you and other patients whose appointments are after yours.  Also, if you no show three or more times for appointments you may be dismissed from the clinic at the providers discretion.     Again, thank you for choosing [facility].  Our hope is that these requests will decrease the amount of time that you wait before being seen by our physicians.       _____________________________________________________________  Should you have questions after your visit to [facility], please contact our office at [phone] and follow the prompts.  Our office hours are 8:00 a.m. and 4:30 p.m. Monday - Friday.  Please note that voicemails left after 4:00 p.m. may not be returned until the following business day.  We are closed weekends and major holidays.  You do have access to a nurse 24-7, just call the main number to the clinic [phone] and do not press any options, hold on the line and a nurse will  answer the phone.    For prescription refill requests, have your pharmacy contact our office and allow 72 hours.    Due to Covid, you will need to wear a mask upon entering the hospital. If you do not have a mask, a mask will be given to you at the Main Entrance upon arrival. For doctor visits, patients may have 1 support person age 68 or older with them. For treatment visits, patients can not have anyone with them due to social distancing guidelines and our immunocompromised population.

## 2022-11-08 NOTE — Progress Notes (Signed)
[facility] [address], [place] [address]   Patient Care Team: Roderic Scarce, MD as PCP - General (Nurse Practitioner) Rockwell Germany, RN as Oncology Nurse Navigator Mauro Kaufmann, RN as Oncology Nurse Navigator Brien Mates, RN as Oncology Nurse Navigator (Oncology)  SUMMARY OF ONCOLOGIC HISTORY: Oncology History  Malignant neoplasm of overlapping sites of left breast in female, estrogen receptor negative ([place])  05/17/2020 Cancer Staging   Staging form: Breast, AJCC 8th Edition - Clinical stage from 05/17/2020: Stage IIB (cT2, cN0, cM0, G3, ER-, PR-, HER2-) - Signed by Gardenia Phlegm, NP on 05/26/2020   05/26/2020 Initial Diagnosis   Patient palpated a left breast mass for 5 months. Mammogram and US showed a 2.7cm mass at the 10:30 position, one left axillary lymph node with cortical thickening, calcifications in the lower inner left breast, and a 0.4cm group of calcifications in the right breast. Biopsy on 05/14/20 showed no malignancy in the right breast, and in the left breast, DCIS, high grade, ER/PR negative. 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    06/16/2020 - 09/01/2021 Chemotherapy   Patient is on Treatment Plan : BREAST Keytruda q 21 days     09/22/2020 Genetic Testing   Negative genetic testing. No pathogenic variants identified on the [facility] Common Hereditary Cancers Panel. The report date is 09/22/2020.  The Common Hereditary Cancers Panel offered by [facility] includes sequencing and/or deletion duplication testing of the following 48 genes: APC, ATM, AXIN2, BARD1, BMPR1A, BRCA1, BRCA2, BRIP1, CDH1, CDKN2A (p14ARF), CDKN2A (p16INK4a), CKD4, CHEK2, CTNNA1, DICER1, EPCAM (Deletion/duplication testing only), GREM1 (promoter region deletion/duplication testing only), KIT, MEN1, MLH1, MSH2, MSH3, MSH6,  MUTYH, NBN, NF1, NHTL1, PALB2, PDGFRA, PMS2, POLD1, POLE, PTEN, RAD50, RAD51C, RAD51D, RNF43, SDHB, SDHC, SDHD, SMAD4, SMARCA4. STK11, TP53, TSC1, TSC2, and VHL.  The following genes were evaluated for sequence changes only: SDHA and HOXB13 c.251G>A variant only.     CHIEF COMPLIANT: Follow-up for triple negative left breast cancer   INTERVAL HISTORY: Jo Mills is a 72 y.o. female seen for follow-up of triple negative left breast cancer.  Denies any new onset pains.  Her husband passed away in 10-03-22.  She reports energy levels of 75%.  Numbness in the feet has been stable.  REVIEW OF SYSTEMS:   Review of Systems  Constitutional:  Negative for appetite change and fatigue.  Neurological:  Positive for numbness.  Psychiatric/Behavioral:  Positive for sleep disturbance.   All other systems reviewed and are negative.   I have reviewed the past medical history, past surgical history, social history and family history with the patient and they are unchanged from previous note.   ALLERGIES:   is allergic to antihistamines, chlorpheniramine-type; grapefruit extract; pseudoephedrine hcl; nickel; pineapple; and amoxicillin.   MEDICATIONS:  Current Outpatient Medications  Medication Sig Dispense Refill   amLODipine-benazepril (LOTREL) 5-20 MG capsule Take 1 capsule by mouth daily.     cholecalciferol (VITAMIN D3) 25 MCG (1000 UNIT) tablet Take 1,000 Units by mouth daily.     fexofenadine (ALLEGRA) 180 MG tablet Take 180 mg by mouth daily.     fluticasone (FLONASE) 50 MCG/ACT nasal spray Place into both nostrils daily.     Lactobacillus-Inulin (CULTURELLE DIGESTIVE DAILY) CAPS Take 1 capsule by mouth daily.     levothyroxine (SYNTHROID) 100 MCG tablet  TAKE 1 TABLET BY MOUTH ONCE DAILY BEFORE BREAKFAST 30 tablet 0   omeprazole (PRILOSEC) 20 MG capsule Take 20 mg by mouth daily.     Bismuth Subsalicylate (PEPTO-BISMOL PO) Take by mouth as needed. (Patient not taking: Reported on  11/08/2022)     No current facility-administered medications for this visit.     PHYSICAL EXAMINATION: Performance status (ECOG): 1 - Symptomatic but completely ambulatory  Vitals:   11/08/22 0840  BP: 136/88  Pulse: 80  Resp: 16  Temp: 98.1 F (36.7 C)  SpO2: 98%   Wt Readings from Last 3 Encounters:  11/08/22 162 lb 1.6 oz (73.5 kg)  02/16/22 161 lb 13.1 oz (73.4 kg)  10/27/21 164 lb (74.4 kg)   Physical Exam Vitals reviewed.  Constitutional:      Appearance: Normal appearance.  Cardiovascular:     Rate and Rhythm: Normal rate and regular rhythm.     Pulses: Normal pulses.     Heart sounds: Normal heart sounds.  Pulmonary:     Effort: Pulmonary effort is normal.     Breath sounds: Normal breath sounds.  Chest:  Breasts:    Right: No swelling, bleeding, inverted nipple, mass, nipple discharge, skin change or tenderness.     Left: No swelling, bleeding, inverted nipple, mass, nipple discharge, skin change (LIQ lumpectomy scar around areola) or tenderness.  Musculoskeletal:     Right lower leg: No edema.     Left lower leg: No edema.  Lymphadenopathy:     Upper Body:     Right upper body: No supraclavicular, axillary or pectoral adenopathy.     Left upper body: No supraclavicular, axillary or pectoral adenopathy.  Neurological:     General: No focal deficit present.     Mental Status: She is alert and oriented to person, place, and time.  Psychiatric:        Mood and Affect: Mood normal.        Behavior: Behavior normal.     Breast Exam Chaperone: Thana Ates     LABORATORY DATA:  I have reviewed the data as listed    Latest Ref Rng & Units 10/17/2022    1:16 PM 02/16/2022    1:13 PM 10/27/2021   12:54 PM  CMP  Glucose 70 - 99 mg/dL 91  97  91   BUN 8 - 23 mg/dL '10  12  14   '$ Creatinine 0.44 - 1.00 mg/dL 0.69  0.68  0.48   Sodium 135 - 145 mmol/L 136  138  136   Potassium 3.5 - 5.1 mmol/L 3.5  3.9  3.4   Chloride 98 - 111 mmol/L 100  106  104   CO2 22  - 32 mmol/L '27  26  25   '$ Calcium 8.9 - 10.3 mg/dL 9.3  9.4  8.7   Total Protein 6.5 - 8.1 g/dL 7.2  7.1  6.2   Total Bilirubin 0.3 - 1.2 mg/dL 0.7  0.9  0.5   Alkaline Phos 38 - 126 U/L 87  86  74   AST 15 - 41 U/L '24  24  18   '$ ALT 0 - 44 U/L '24  23  16    '$ Lab Results  Component Value Date   CAN153 28.4 (H) 10/17/2022   CAN153 26.4 (H) 02/16/2022   CAN153 21.8 10/27/2021   Lab Results  Component Value Date   WBC 6.3 10/17/2022   HGB 14.2 10/17/2022   HCT 42.1 10/17/2022   MCV 95.7  10/17/2022   PLT 247 10/17/2022   NEUTROABS 3.2 10/17/2022    ASSESSMENT:  1.  Stage II (T2N0) left breast TNBC: -Felt left breast mass for 5 months. -Mammogram on 05/07/2020 with suspicious mass in the 10:30 o'clock position in the left breast.  Suspicious calcifications in the anterior lower inner quadrant of the left breast. -MRI on 06/05/2020 shows left breast mass measuring 3.8 x 3.3 x 3.2 cm in the upper inner quadrant.  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  No suspicious lymphadenopathy.  No MRI evidence of malignancy on the right. -Left breast upper inner quadrant biopsy on 05/14/2020 showed DCIS with calcifications.  Right breast biopsy shows fibroadenomatoid nodule with calcifications.  ER/PR was negative. -Left breast core needle biopsy at 10:30 position shows invasive ductal carcinoma.  ER/PR/HER-2 was negative.  Ki-67 85%.  Left lymph node biopsy was reactive. -Patient was evaluated by Dr. [name] and Dr. Georgette Dover. -She received first cycle of chemotherapy with Adriamycin and cyclophosphamide with pembrolizumab (keynote-522 trial) added for improved PCR by about 15%. -Cycle 1 of Adriamycin, cyclophosphamide and pembrolizumab on 06/16/2020. - Left lumpectomy and SLNB on 01/12/2021, YPT0, ypN0. - Adjuvant Keytruda 9 cycles from 02/15/2021 through 09/01/2021.   2.  Family history: -Paternal cousin and second cousin had breast cancer.  Paternal grandmother also had some  type of cancer. -Another paternal cousin died of pancreatic cancer.  Another paternal cousin had leukemia. - Germline mutation testing was negative for BRCA1/2.   PLAN:  1.  Triple negative left breast cancer: - Physical examination today did not reveal any palpable mass or adenopathy.  Lumpectomy site is within normal limits. - Mammogram (10/17/2022): BI-RADS Category 2. - LFTs and CBC are within normal limits.  CA 15-3 was 28.4, up from 26.4 in May. - Recommend follow-up in 3 months with repeat tumor marker.  If it continues to go high, will consider imaging.   2.  High risk drug monitoring: - TSH today 6.9.  For few days she was taking 50 mcg tablet.  Continue Synthroid 100 mcg daily.  Will check TSH in 3 months.  Breast Cancer therapy associated bone loss: I have recommended calcium, Vitamin D and weight bearing exercises.  Orders placed this encounter:  No orders of the defined types were placed in this encounter.   The patient has a good understanding of the overall plan. She agrees with it. She will call with any problems that may develop before the next visit here.  Derek Jack, MD [facility] [phone]

## 2022-11-22 ENCOUNTER — Other Ambulatory Visit: Payer: Self-pay | Admitting: Hematology

## 2022-12-18 ENCOUNTER — Other Ambulatory Visit: Payer: Self-pay | Admitting: Hematology

## 2023-01-20 ENCOUNTER — Other Ambulatory Visit: Payer: Self-pay | Admitting: Hematology

## 2023-02-01 ENCOUNTER — Inpatient Hospital Stay: Payer: BC Managed Care – PPO | Attending: Hematology

## 2023-02-01 DIAGNOSIS — Z79899 Other long term (current) drug therapy: Secondary | ICD-10-CM | POA: Diagnosis not present

## 2023-02-01 DIAGNOSIS — Z803 Family history of malignant neoplasm of breast: Secondary | ICD-10-CM | POA: Diagnosis not present

## 2023-02-01 DIAGNOSIS — Z8249 Family history of ischemic heart disease and other diseases of the circulatory system: Secondary | ICD-10-CM | POA: Insufficient documentation

## 2023-02-01 DIAGNOSIS — Z85828 Personal history of other malignant neoplasm of skin: Secondary | ICD-10-CM | POA: Diagnosis not present

## 2023-02-01 DIAGNOSIS — Z171 Estrogen receptor negative status [ER-]: Secondary | ICD-10-CM | POA: Diagnosis not present

## 2023-02-01 DIAGNOSIS — Z880 Allergy status to penicillin: Secondary | ICD-10-CM | POA: Insufficient documentation

## 2023-02-01 DIAGNOSIS — Z8379 Family history of other diseases of the digestive system: Secondary | ICD-10-CM | POA: Insufficient documentation

## 2023-02-01 DIAGNOSIS — Z809 Family history of malignant neoplasm, unspecified: Secondary | ICD-10-CM | POA: Diagnosis not present

## 2023-02-01 DIAGNOSIS — Z800 Family history of malignant neoplasm of digestive organs: Secondary | ICD-10-CM | POA: Diagnosis not present

## 2023-02-01 DIAGNOSIS — Z79890 Hormone replacement therapy (postmenopausal): Secondary | ICD-10-CM | POA: Insufficient documentation

## 2023-02-01 DIAGNOSIS — C50812 Malignant neoplasm of overlapping sites of left female breast: Secondary | ICD-10-CM | POA: Insufficient documentation

## 2023-02-01 DIAGNOSIS — N6311 Unspecified lump in the right breast, upper outer quadrant: Secondary | ICD-10-CM | POA: Diagnosis not present

## 2023-02-01 DIAGNOSIS — N6314 Unspecified lump in the right breast, lower inner quadrant: Secondary | ICD-10-CM | POA: Insufficient documentation

## 2023-02-01 LAB — CBC WITH DIFFERENTIAL/PLATELET
Abs Immature Granulocytes: 0.01 10*3/uL (ref 0.00–0.07)
Basophils Absolute: 0.1 10*3/uL (ref 0.0–0.1)
Basophils Relative: 1 %
Eosinophils Absolute: 0.2 10*3/uL (ref 0.0–0.5)
Eosinophils Relative: 3 %
HCT: 40.3 % (ref 36.0–46.0)
Hemoglobin: 13.4 g/dL (ref 12.0–15.0)
Immature Granulocytes: 0 %
Lymphocytes Relative: 43 %
Lymphs Abs: 2.7 10*3/uL (ref 0.7–4.0)
MCH: 32.1 pg (ref 26.0–34.0)
MCHC: 33.3 g/dL (ref 30.0–36.0)
MCV: 96.4 fL (ref 80.0–100.0)
Monocytes Absolute: 0.4 10*3/uL (ref 0.1–1.0)
Monocytes Relative: 7 %
Neutro Abs: 2.9 10*3/uL (ref 1.7–7.7)
Neutrophils Relative %: 46 %
Platelets: 216 10*3/uL (ref 150–400)
RBC: 4.18 MIL/uL (ref 3.87–5.11)
RDW: 12.7 % (ref 11.5–15.5)
WBC: 6.3 10*3/uL (ref 4.0–10.5)
nRBC: 0 % (ref 0.0–0.2)

## 2023-02-01 LAB — COMPREHENSIVE METABOLIC PANEL
ALT: 26 U/L (ref 0–44)
AST: 28 U/L (ref 15–41)
Albumin: 4 g/dL (ref 3.5–5.0)
Alkaline Phosphatase: 79 U/L (ref 38–126)
Anion gap: 9 (ref 5–15)
BUN: 16 mg/dL (ref 8–23)
CO2: 25 mmol/L (ref 22–32)
Calcium: 9.1 mg/dL (ref 8.9–10.3)
Chloride: 103 mmol/L (ref 98–111)
Creatinine, Ser: 0.83 mg/dL (ref 0.44–1.00)
GFR, Estimated: >60 mL/min (ref >60)
Glucose, Bld: 82 mg/dL (ref 70–99)
Potassium: 3.5 mmol/L (ref 3.5–5.1)
Sodium: 137 mmol/L (ref 135–145)
Total Bilirubin: 0.5 mg/dL (ref 0.3–1.2)
Total Protein: 6.7 g/dL (ref 6.5–8.1)

## 2023-02-01 LAB — TSH: TSH: 0.805 u[IU]/mL (ref 0.350–4.500)

## 2023-02-02 LAB — CANCER ANTIGEN 27.29: CA 27.29: 21.1 U/mL (ref 0.0–38.6)

## 2023-02-03 LAB — CANCER ANTIGEN 15-3: CA 15-3: 26.3 U/mL — ABNORMAL HIGH (ref 0.0–25.0)

## 2023-02-08 ENCOUNTER — Inpatient Hospital Stay (HOSPITAL_BASED_OUTPATIENT_CLINIC_OR_DEPARTMENT_OTHER): Payer: BC Managed Care – PPO | Admitting: Hematology

## 2023-02-08 VITALS — BP 133/84 | HR 59 | Temp 98.6°F | Resp 17 | Wt 162.1 lb

## 2023-02-08 DIAGNOSIS — Z171 Estrogen receptor negative status [ER-]: Secondary | ICD-10-CM | POA: Diagnosis not present

## 2023-02-08 DIAGNOSIS — C50812 Malignant neoplasm of overlapping sites of left female breast: Secondary | ICD-10-CM | POA: Diagnosis not present

## 2023-02-08 NOTE — Patient Instructions (Addendum)
[facility] - [facility]  Discharge Instructions  You were seen and examined today by Dr. Ellin Saba.  Dr. Ellin Saba discussed your most recent lab work and CT scan which revealed that everything looks good.  Follow-up as scheduled in 4 months.   Thank you for choosing [facility] to provide your oncology and hematology care.   To afford each patient quality time with our provider, please arrive at least 15 minutes before your scheduled appointment time. You may need to reschedule your appointment if you arrive late (10 or more minutes). Arriving late affects you and other patients whose appointments are after yours.  Also, if you miss three or more appointments without notifying the office, you may be dismissed from the clinic at the provider's discretion.    Again, thank you for choosing [facility].  Our hope is that these requests will decrease the amount of time that you wait before being seen by our physicians.   If you have a lab appointment with the Cancer Center - please note that after April 8th, all labs will be drawn in the cancer center.  You do not have to check in or register with the main entrance as you have in the past but will complete your check-in at the cancer center.            _____________________________________________________________  Should you have questions after your visit to [facility], please contact our office at [phone] and follow the prompts.  Our office hours are 8:00 a.m. to 4:30 p.m. Monday - Thursday and 8:00 a.m. to 2:30 p.m. Friday.  Please note that voicemails left after 4:00 p.m. may not be returned until the following business day.  We are closed weekends and all major holidays.  You do have access to a nurse 24-7, just call the main number to the clinic [phone] and do not press any options, hold on the line and a nurse will answer the phone.    For prescription refill  requests, have your pharmacy contact our office and allow 72 hours.    Masks are no longer required in the cancer centers. If you would like for your care team to wear a mask while they are taking care of you, please let them know. You may have one support person who is at least 70 years old accompany you for your appointments.

## 2023-02-08 NOTE — Progress Notes (Signed)
[facility] [address], [place] [address]    Clinic Day:  02/08/2023  Referring physician: Virgina Norfolk, MD  Patient Care Team: Virgina Norfolk, MD as PCP - General (Nurse Practitioner) Donnelly Angelica, RN as Oncology Nurse Navigator Pershing Proud, RN as Oncology Nurse Navigator Therese Sarah, RN as Oncology Nurse Navigator (Oncology)   ASSESSMENT & PLAN:   Assessment: 1.  Stage II (T2N0) left breast TNBC: -Felt left breast mass for 5 months. -Mammogram on 05/07/2020 with suspicious mass in the 10:30 o'clock position in the left breast.  Suspicious calcifications in the anterior lower inner quadrant of the left breast. -MRI on 06/05/2020 shows left breast mass measuring 3.8 x 3.3 x 3.2 cm in the upper inner quadrant.  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  No suspicious lymphadenopathy.  No MRI evidence of malignancy on the right. -Left breast upper inner quadrant biopsy on 05/14/2020 showed DCIS with calcifications.  Right breast biopsy shows fibroadenomatoid nodule with calcifications.  ER/PR was negative. -Left breast core needle biopsy at 10:30 position shows invasive ductal carcinoma.  ER/PR/HER-2 was negative.  Ki-67 85%.  Left lymph node biopsy was reactive. -Patient was evaluated by Dr. Pamelia Hoit and Dr. Corliss Skains. -She received first cycle of chemotherapy with Adriamycin and cyclophosphamide with pembrolizumab (keynote-522 trial) added for improved PCR by about 15%. -Cycle 1 of Adriamycin, cyclophosphamide and pembrolizumab on 06/16/2020. - Left lumpectomy and SLNB on 01/12/2021, YPT0, ypN0. - Adjuvant Keytruda 9 cycles from 02/15/2021 through 09/01/2021.   2.  Family history: -Paternal cousin and second cousin had breast cancer.  Paternal grandmother also had some type of cancer. -Another paternal cousin died of pancreatic cancer.  Another paternal cousin had leukemia. - Germline mutation testing was negative for  BRCA1/2.    Plan: 1.  Triple negative left breast cancer: - Physical exam: No palpable adenopathy in the neck or infra axillary region. - Mammogram on 10/17/2022: BI-RADS Category 2. - Numbness in the feet is stable.  No new pains. - Labs: Normal LFTs and CBC.  CA 15-3 has come down again to 26.3 from 28.4. - Recommend follow-up in 4 months with repeat tumor markers and labs.  No indication for imaging at this time.   2.  High risk drug monitoring: - Continue Synthroid 100 mcg daily.  TSH improved to 0.8.  Orders Placed This Encounter  Procedures   CBC with Differential/Platelet    Standing Status:   Future    Standing Expiration Date:   02/08/2024    Order Specific Question:   Release to patient    Answer:   Immediate   Comprehensive metabolic panel    Standing Status:   Future    Standing Expiration Date:   02/08/2024    Order Specific Question:   Release to patient    Answer:   Immediate   TSH    Standing Status:   Future    Standing Expiration Date:   02/08/2024    Order Specific Question:   Release to patient    Answer:   Immediate   Cancer antigen 15-3    Standing Status:   Future    Standing Expiration Date:   02/08/2024   Cancer antigen 27.29    Standing Status:   Future    Standing Expiration Date:   02/08/2024      I,Katie Daubenspeck,acting as a scribe for [name], MD.,have documented all relevant documentation on the behalf  of [name], MD,as directed by  [name], MD while in the presence of [name], MD.   I, [name] MD, have reviewed the above documentation for accuracy and completeness, and I agree with the above.   [name], MD   4/25/20245:10 PM  CHIEF COMPLAINT:   Diagnosis: triple negative left breast cancer    Cancer Staging  Malignant neoplasm of overlapping sites of left breast in female, estrogen receptor negative Staging form: Breast, AJCC 8th Edition - Clinical stage from  05/17/2020: Stage IIB (cT2, cN0, cM0, G3, ER-, PR-, HER2-) - Signed by Loa Socks, NP on 05/26/2020    Prior Therapy: 1. Neoadjuvant Adriamycin, cyclophosphamide and pembrolizumab 06/16/20 - 08/18/20 2. Neoadjuvant carboplatin and taxol 09/08/20 - 12/08/20 3. Left lumpectomy and SLNB on 01/12/2021 4. Adjuvant Keytruda 9 cycles from 02/15/2021 through 09/01/2021   Current Therapy:  surveillance   HISTORY OF PRESENT ILLNESS:   Oncology History  Malignant neoplasm of overlapping sites of left breast in female, estrogen receptor negative  05/17/2020 Cancer Staging   Staging form: Breast, AJCC 8th Edition - Clinical stage from 05/17/2020: Stage IIB (cT2, cN0, cM0, G3, ER-, PR-, HER2-) - Signed by Loa Socks, NP on 05/26/2020   05/26/2020 Initial Diagnosis   Patient palpated a left breast mass for 5 months. Mammogram and US showed a 2.7cm mass at the 10:30 position, one left axillary lymph node with cortical thickening, calcifications in the lower inner left breast, and a 0.4cm group of calcifications in the right breast. Biopsy on 05/14/20 showed no malignancy in the right breast, and in the left breast, DCIS, high grade, ER/PR negative. 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    06/16/2020 - 09/01/2021 Chemotherapy   Patient is on Treatment Plan : BREAST Keytruda q 21 days     09/22/2020 Genetic Testing   Negative genetic testing. No pathogenic variants identified on the [facility] Common Hereditary Cancers Panel. The report date is 09/22/2020.  The Common Hereditary Cancers Panel offered by [facility] includes sequencing and/or deletion duplication testing of the following 48 genes: APC, ATM, AXIN2, BARD1, BMPR1A, BRCA1, BRCA2, BRIP1, CDH1, CDKN2A (p14ARF), CDKN2A (p16INK4a), CKD4, CHEK2, CTNNA1, DICER1, EPCAM (Deletion/duplication testing only), GREM1  (promoter region deletion/duplication testing only), KIT, MEN1, MLH1, MSH2, MSH3, MSH6, MUTYH, NBN, NF1, NHTL1, PALB2, PDGFRA, PMS2, POLD1, POLE, PTEN, RAD50, RAD51C, RAD51D, RNF43, SDHB, SDHC, SDHD, SMAD4, SMARCA4. STK11, TP53, TSC1, TSC2, and VHL.  The following genes were evaluated for sequence changes only: SDHA and HOXB13 c.251G>A variant only.      INTERVAL HISTORY:   Jo Mills is a 72 y.o. female presenting to clinic today for follow up of triple negative left breast cancer. She was last seen by me on 11/08/22.  Today, she states that she is doing well overall. Her appetite level is at 100%. Her energy level is at 90%.  PAST MEDICAL HISTORY:   Past Medical History: Past Medical History:  Diagnosis Date   Allergy    Breast cancer (HCC)    Cancer (HCC)    Diverticulitis    Family history of breast cancer 08/17/2020   Family history of pancreatic cancer 08/17/2020   GERD (gastroesophageal reflux disease)    Hypertension    Hypothyroidism    Personal history of chemotherapy    Personal history of radiation therapy    Skin cancer  Surgical History: Past Surgical History:  Procedure Laterality Date   BREAST BIOPSY Left 05/14/2020   x2   BREAST BIOPSY Bilateral 05/05/2020   BREAST LUMPECTOMY Left 01/12/2021   BREAST LUMPECTOMY WITH RADIOACTIVE SEED LOCALIZATION Left 01/12/2021   Procedure: LEFT BREAST LUMPECTOMY X 2 WITH RADIOACTIVE SEED LOCALIZATION;  Surgeon: Manus Rudd, MD;  Location: [facility];  Service: General;  Laterality: Left;   COLONOSCOPY     PORTACATH PLACEMENT N/A 06/15/2020   Procedure: INSERTION PORT-A-CATH WITH ULTRASOUND GUIDANCE;  Surgeon: Manus Rudd, MD;  Location: [facility];  Service: General;  Laterality: Right;  LMA, LEAVE PORT ACCESSED-CHEMO START 9/1   SENTINEL NODE BIOPSY N/A 01/12/2021   Procedure: SENTINEL NODE BIOPSY;  Surgeon: Manus Rudd, MD;  Location: [facility];  Service: General;   Laterality: N/A;    Social History: Social History   Socioeconomic History   Marital status: Married    Spouse name: Not on file   Number of children: Not on file   Years of education: Not on file   Highest education level: Not on file  Occupational History   Occupation: High school librarian  Tobacco Use   Smoking status: Never   Smokeless tobacco: Never  Substance and Sexual Activity   Alcohol use: Not Currently   Drug use: Never   Sexual activity: Not on file  Other Topics Concern   Not on file  Social History Narrative   Not on file   Social Determinants of Health   Financial Resource Strain: Low Risk  (06/23/2020)   Overall Financial Resource Strain (CARDIA)    Difficulty of Paying Living Expenses: Not hard at all  Food Insecurity: No Food Insecurity (06/23/2020)   Hunger Vital Sign    Worried About Running Out of Food in the Last Year: Never true    Ran Out of Food in the Last Year: Never true  Transportation Needs: No Transportation Needs (06/23/2020)   PRAPARE - Administrator, Civil Service (Medical): No    Lack of Transportation (Non-Medical): No  Physical Activity: Sufficiently Active (06/23/2020)   Exercise Vital Sign    Days of Exercise per Week: 7 days    Minutes of Exercise per Session: 60 min  Stress: Stress Concern Present (06/23/2020)   [employer] of Occupational Health - Occupational Stress Questionnaire    Feeling of Stress : To some extent  Social Connections: Moderately Integrated (06/23/2020)   Social Connection and Isolation Panel [NHANES]    Frequency of Communication with Friends and Family: Once a week    Frequency of Social Gatherings with Friends and Family: Never    Attends Religious Services: More than 4 times per year    Active Member of [employer] or Organizations: Yes    Attends Banker Meetings: 1 to 4 times per year    Marital Status: Married  Catering manager Violence: Not At Risk (06/23/2020)   Humiliation,  Afraid, Rape, and Kick questionnaire    Fear of Current or Ex-Partner: No    Emotionally Abused: No    Physically Abused: No    Sexually Abused: No    Family History: Family History  Problem Relation Age of Onset   Diverticulitis Mother    Hypertension Maternal Aunt    Hypertension Maternal Uncle    Cancer Paternal Grandmother        unknown type; d. 29s   Breast cancer Cousin 47       paternal. recurred for  2nd time.   Breast cancer Cousin 29   Pancreatic cancer Cousin        paternal; dx 52s    Current Medications:  Current Outpatient Medications:    amLODipine-benazepril (LOTREL) 5-20 MG capsule, Take 1 capsule by mouth daily., Disp: , Rfl:    Bismuth Subsalicylate (PEPTO-BISMOL PO), Take by mouth as needed., Disp: , Rfl:    cholecalciferol (VITAMIN D3) 25 MCG (1000 UNIT) tablet, Take 1,000 Units by mouth daily., Disp: , Rfl:    fexofenadine (ALLEGRA) 180 MG tablet, Take 180 mg by mouth daily., Disp: , Rfl:    fluticasone (FLONASE) 50 MCG/ACT nasal spray, Place into both nostrils daily., Disp: , Rfl:    Lactobacillus-Inulin (CULTURELLE DIGESTIVE DAILY) CAPS, Take 1 capsule by mouth daily., Disp: , Rfl:    levothyroxine (SYNTHROID) 100 MCG tablet, TAKE 1 TABLET BY MOUTH ONCE DAILY BEFORE BREAKFAST, Disp: 30 tablet, Rfl: 3   omeprazole (PRILOSEC) 20 MG capsule, Take 20 mg by mouth daily., Disp: , Rfl:    Allergies: Allergies  Allergen Reactions   Antihistamines, Chlorpheniramine-Type Rash   Grapefruit Extract Other (See Comments)    Cannot take with blood pressure medications   Pseudoephedrine Hcl Other (See Comments)    Other Reaction: Other reaction   Nickel    Pineapple    Amoxicillin Rash and Hives    REVIEW OF SYSTEMS:   Review of Systems  Constitutional:  Negative for chills, fatigue and fever.  HENT:   Negative for lump/mass, mouth sores, nosebleeds, sore throat and trouble swallowing.   Eyes:  Negative for eye problems.  Respiratory:  Negative for cough  and shortness of breath.   Cardiovascular:  Negative for chest pain, leg swelling and palpitations.  Gastrointestinal:  Positive for constipation and diarrhea. Negative for abdominal pain, nausea and vomiting.  Genitourinary:  Negative for bladder incontinence, difficulty urinating, dysuria, frequency, hematuria and nocturia.   Musculoskeletal:  Negative for arthralgias, back pain, flank pain, myalgias and neck pain.  Skin:  Negative for itching and rash.  Neurological:  Positive for dizziness and numbness. Negative for headaches.  Hematological:  Does not bruise/bleed easily.  Psychiatric/Behavioral:  Positive for depression. Negative for sleep disturbance and suicidal ideas. The patient is not nervous/anxious.   All other systems reviewed and are negative.    VITALS:   Blood pressure 133/84, pulse (!) 59, temperature 98.6 F (37 C), temperature source Oral, resp. rate 17, weight 162 lb 1.6 oz (73.5 kg), SpO2 97 %.  Wt Readings from Last 3 Encounters:  02/08/23 162 lb 1.6 oz (73.5 kg)  11/08/22 162 lb 1.6 oz (73.5 kg)  02/16/22 161 lb 13.1 oz (73.4 kg)    Body mass index is 28.02 kg/m.  Performance status (ECOG): 1 - Symptomatic but completely ambulatory  PHYSICAL EXAM:   Physical Exam Vitals and nursing note reviewed. Exam conducted with a chaperone present.  Constitutional:      Appearance: Normal appearance.  Cardiovascular:     Rate and Rhythm: Normal rate and regular rhythm.     Pulses: Normal pulses.     Heart sounds: Normal heart sounds.  Pulmonary:     Effort: Pulmonary effort is normal.     Breath sounds: Normal breath sounds.  Abdominal:     Palpations: Abdomen is soft. There is no hepatomegaly, splenomegaly or mass.     Tenderness: There is no abdominal tenderness.  Musculoskeletal:     Right lower leg: No edema.     Left lower leg: No  edema.  Lymphadenopathy:     Cervical: No cervical adenopathy.     Right cervical: No superficial, deep or posterior  cervical adenopathy.    Left cervical: No superficial, deep or posterior cervical adenopathy.     Upper Body:     Right upper body: No supraclavicular or axillary adenopathy.     Left upper body: No supraclavicular or axillary adenopathy.  Neurological:     General: No focal deficit present.     Mental Status: She is alert and oriented to person, place, and time.  Psychiatric:        Mood and Affect: Mood normal.        Behavior: Behavior normal.     LABS:      Latest Ref Rng & Units 02/01/2023    1:30 PM 10/17/2022    1:16 PM 02/16/2022    1:13 PM  CBC  WBC 4.0 - 10.5 K/uL 6.3  6.3  4.9   Hemoglobin 12.0 - 15.0 g/dL 57.8  46.9  62.9   Hematocrit 36.0 - 46.0 % 40.3  42.1  40.1   Platelets 150 - 400 K/uL 216  247  206       Latest Ref Rng & Units 02/01/2023    1:30 PM 10/17/2022    1:16 PM 02/16/2022    1:13 PM  CMP  Glucose 70 - 99 mg/dL 82  91  97   BUN 8 - 23 mg/dL Creatinine 0.44 - 1.00 mg/dL 5.28  4.13  2.44   Sodium 135 - 145 mmol/L 137  136  138   Potassium 3.5 - 5.1 mmol/L 3.5  3.5  3.9   Chloride 98 - 111 mmol/L 103  100  106   CO2 22 - 32 mmol/L Calcium 8.9 - 10.3 mg/dL 9.1  9.3  9.4   Total Protein 6.5 - 8.1 g/dL 6.7  7.2  7.1   Total Bilirubin 0.3 - 1.2 mg/dL 0.5  0.7  0.9   Alkaline Phos 38 - 126 U/L 79  87  86   AST 15 - 41 U/L ALT 0 - 44 U/L No results found for: "CEA1", "CEA" / No results found for: "CEA1", "CEA" No results found for: "PSA1" No results found for: "WNU272" No results found for: "CAN125"  No results found for: "TOTALPROTELP", "ALBUMINELP", "A1GS", "A2GS", "BETS", "BETA2SER", "GAMS", "MSPIKE", "SPEI" No results found for: "TIBC", "FERRITIN", "IRONPCTSAT" No results found for: "LDH"   STUDIES:   No results found.

## 2023-04-01 LAB — COLOGUARD: COLOGUARD: NEGATIVE

## 2023-06-21 ENCOUNTER — Inpatient Hospital Stay: Payer: BC Managed Care – PPO | Attending: Hematology

## 2023-06-21 ENCOUNTER — Ambulatory Visit: Payer: BC Managed Care – PPO | Admitting: Hematology

## 2023-06-21 DIAGNOSIS — Z800 Family history of malignant neoplasm of digestive organs: Secondary | ICD-10-CM | POA: Diagnosis not present

## 2023-06-21 DIAGNOSIS — Z880 Allergy status to penicillin: Secondary | ICD-10-CM | POA: Insufficient documentation

## 2023-06-21 DIAGNOSIS — Z8249 Family history of ischemic heart disease and other diseases of the circulatory system: Secondary | ICD-10-CM | POA: Insufficient documentation

## 2023-06-21 DIAGNOSIS — Z79899 Other long term (current) drug therapy: Secondary | ICD-10-CM | POA: Diagnosis not present

## 2023-06-21 DIAGNOSIS — F32A Depression, unspecified: Secondary | ICD-10-CM | POA: Insufficient documentation

## 2023-06-21 DIAGNOSIS — R42 Dizziness and giddiness: Secondary | ICD-10-CM | POA: Diagnosis not present

## 2023-06-21 DIAGNOSIS — C50812 Malignant neoplasm of overlapping sites of left female breast: Secondary | ICD-10-CM | POA: Insufficient documentation

## 2023-06-21 DIAGNOSIS — Z85828 Personal history of other malignant neoplasm of skin: Secondary | ICD-10-CM | POA: Insufficient documentation

## 2023-06-21 DIAGNOSIS — Z803 Family history of malignant neoplasm of breast: Secondary | ICD-10-CM | POA: Insufficient documentation

## 2023-06-21 DIAGNOSIS — R519 Headache, unspecified: Secondary | ICD-10-CM | POA: Insufficient documentation

## 2023-06-21 DIAGNOSIS — Z8379 Family history of other diseases of the digestive system: Secondary | ICD-10-CM | POA: Diagnosis not present

## 2023-06-21 DIAGNOSIS — R0602 Shortness of breath: Secondary | ICD-10-CM | POA: Diagnosis not present

## 2023-06-21 DIAGNOSIS — K5900 Constipation, unspecified: Secondary | ICD-10-CM | POA: Diagnosis not present

## 2023-06-21 DIAGNOSIS — Z171 Estrogen receptor negative status [ER-]: Secondary | ICD-10-CM | POA: Insufficient documentation

## 2023-06-21 LAB — CBC WITH DIFFERENTIAL/PLATELET
Abs Immature Granulocytes: 0.01 10*3/uL (ref 0.00–0.07)
Basophils Absolute: 0.1 10*3/uL (ref 0.0–0.1)
Basophils Relative: 1 %
Eosinophils Absolute: 0.2 10*3/uL (ref 0.0–0.5)
Eosinophils Relative: 3 %
HCT: 42.1 % (ref 36.0–46.0)
Hemoglobin: 13.9 g/dL (ref 12.0–15.0)
Immature Granulocytes: 0 %
Lymphocytes Relative: 44 %
Lymphs Abs: 2.7 10*3/uL (ref 0.7–4.0)
MCH: 31.5 pg (ref 26.0–34.0)
MCHC: 33 g/dL (ref 30.0–36.0)
MCV: 95.5 fL (ref 80.0–100.0)
Monocytes Absolute: 0.4 10*3/uL (ref 0.1–1.0)
Monocytes Relative: 7 %
Neutro Abs: 2.8 10*3/uL (ref 1.7–7.7)
Neutrophils Relative %: 45 %
Platelets: 242 10*3/uL (ref 150–400)
RBC: 4.41 MIL/uL (ref 3.87–5.11)
RDW: 13.1 % (ref 11.5–15.5)
WBC: 6.2 10*3/uL (ref 4.0–10.5)
nRBC: 0 % (ref 0.0–0.2)

## 2023-06-21 LAB — COMPREHENSIVE METABOLIC PANEL
ALT: 30 U/L (ref 0–44)
AST: 26 U/L (ref 15–41)
Albumin: 3.9 g/dL (ref 3.5–5.0)
Alkaline Phosphatase: 95 U/L (ref 38–126)
Anion gap: 11 (ref 5–15)
BUN: 14 mg/dL (ref 8–23)
CO2: 25 mmol/L (ref 22–32)
Calcium: 8.9 mg/dL (ref 8.9–10.3)
Chloride: 101 mmol/L (ref 98–111)
Creatinine, Ser: 0.7 mg/dL (ref 0.44–1.00)
GFR, Estimated: >60 mL/min (ref >60)
Glucose, Bld: 97 mg/dL (ref 70–99)
Potassium: 3.6 mmol/L (ref 3.5–5.1)
Sodium: 137 mmol/L (ref 135–145)
Total Bilirubin: 0.5 mg/dL (ref 0.3–1.2)
Total Protein: 6.6 g/dL (ref 6.5–8.1)

## 2023-06-21 LAB — TSH: TSH: 0.976 u[IU]/mL (ref 0.350–4.500)

## 2023-06-22 LAB — CANCER ANTIGEN 27.29: CA 27.29: 28 U/mL (ref 0.0–38.6)

## 2023-06-22 LAB — CANCER ANTIGEN 15-3: CA 15-3: 27.2 U/mL — ABNORMAL HIGH (ref 0.0–25.0)

## 2023-07-01 NOTE — Progress Notes (Signed)
[facility] [address], [place] [address]    Clinic Day:  07/02/2023  Referring physician: Virgina Norfolk, MD  Patient Care Team: Virgina Norfolk, MD as PCP - General (Nurse Practitioner) Donnelly Angelica, RN as Oncology Nurse Navigator Pershing Proud, RN as Oncology Nurse Navigator Therese Sarah, RN as Oncology Nurse Navigator (Oncology)   ASSESSMENT & PLAN:   Assessment: 1.  Stage II (T2N0) left breast TNBC: -Felt left breast mass for 5 months. -Mammogram on 05/07/2020 with suspicious mass in the 10:30 o'clock position in the left breast.  Suspicious calcifications in the anterior lower inner quadrant of the left breast. -MRI on 06/05/2020 shows left breast mass measuring 3.8 x 3.3 x 3.2 cm in the upper inner quadrant.  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  No suspicious lymphadenopathy.  No MRI evidence of malignancy on the right. -Left breast upper inner quadrant biopsy on 05/14/2020 showed DCIS with calcifications.  Right breast biopsy shows fibroadenomatoid nodule with calcifications.  ER/PR was negative. -Left breast core needle biopsy at 10:30 position shows invasive ductal carcinoma.  ER/PR/HER-2 was negative.  Ki-67 85%.  Left lymph node biopsy was reactive. -Patient was evaluated by Dr. Pamelia Hoit and Dr. Corliss Skains. -She received first cycle of chemotherapy with Adriamycin and cyclophosphamide with pembrolizumab (keynote-522 trial) added for improved PCR by about 15%. -Cycle 1 of Adriamycin, cyclophosphamide and pembrolizumab on 06/16/2020. - Left lumpectomy and SLNB on 01/12/2021, YPT0, ypN0. - Adjuvant Keytruda 9 cycles from 02/15/2021 through 09/01/2021.   2.  Family history: -Paternal cousin and second cousin had breast cancer.  Paternal grandmother also had some type of cancer. -Another paternal cousin died of pancreatic cancer.  Another paternal cousin had leukemia. - Germline mutation testing was negative for  BRCA1/2.    Plan: 1.  Triple negative left breast cancer: - She denies any new onset pains. - Mammogram on 10/17/2022: BI-RADS Category 2. - Physical exam: Lumpectomy scar in the medial margin of the areola in the left breast is stable.  No palpable masses in both breasts.  No palpable adenopathy. - Labs from 06/21/2023: Normal LFTs and creatinine.  CBC normal. - CA 15-3 has been stable between 26-28 since May 2023.  CA 27-29 is normal. - If there is any significant changes in the tumor markers, consider imaging. - RTC 6 months for follow-up.  We will arrange for mammogram in January next year.   2.  High risk drug monitoring: - TSH is 0.976.  Continue Synthroid 100 mcg daily.    Orders Placed This Encounter  Procedures   MM DIAG BREAST TOMO BILATERAL    NAS No implants [facility]    Standing Status:   Future    Standing Expiration Date:   07/01/2024    Order Specific Question:   Reason for Exam (SYMPTOM  OR DIAGNOSIS REQUIRED)    Answer:   breast cancer screening - hx of breast cancer    Order Specific Question:   Preferred imaging location?    Answer:   [name]   CBC with Differential    Standing Status:   Future    Standing Expiration Date:   07/01/2024   Comprehensive metabolic panel    Standing Status:   Future    Standing Expiration Date:   07/01/2024   TSH    Standing Status:   Future    Standing Expiration Date:   07/01/2024   Cancer antigen 27.29  Standing Status:   Future    Standing Expiration Date:   07/01/2024   Cancer antigen 15-3    Standing Status:   Future    Standing Expiration Date:   07/01/2024      I,Jo Mills,acting as a scribe for Jo Massed, MD.,have documented all relevant documentation on the behalf of Jo Massed, MD,as directed by  Jo Massed, MD while in the presence of Jo Massed, MD.   I, Jo Massed MD, have reviewed the above documentation for accuracy and completeness, and I  agree with the above.   Jo Massed, MD   9/16/20249:04 AM  CHIEF COMPLAINT:   Diagnosis: triple negative left breast cancer    Cancer Staging  Malignant neoplasm of overlapping sites of left breast in female, estrogen receptor negative (HCC) Staging form: Breast, AJCC 8th Edition - Clinical stage from 05/17/2020: Stage IIB (cT2, cN0, cM0, G3, ER-, PR-, HER2-) - Signed by Loa Socks, NP on 05/26/2020    Prior Therapy: 1. Neoadjuvant Adriamycin, cyclophosphamide and pembrolizumab 06/16/20 - 08/18/20 2. Neoadjuvant carboplatin and taxol 09/08/20 - 12/08/20 3. Left lumpectomy and SLNB on 01/12/2021 4. Adjuvant Keytruda 9 cycles from 02/15/2021 through 09/01/2021   Current Therapy:  surveillance    HISTORY OF PRESENT ILLNESS:   Oncology History  Malignant neoplasm of overlapping sites of left breast in female, estrogen receptor negative (HCC)  05/17/2020 Cancer Staging   Staging form: Breast, AJCC 8th Edition - Clinical stage from 05/17/2020: Stage IIB (cT2, cN0, cM0, G3, ER-, PR-, HER2-) - Signed by Loa Socks, NP on 05/26/2020   05/26/2020 Initial Diagnosis   Patient palpated a left breast mass for 5 months. Mammogram and US showed a 2.7cm mass at the 10:30 position, one left axillary lymph node with cortical thickening, calcifications in the lower inner left breast, and a 0.4cm group of calcifications in the right breast. Biopsy on 05/14/20 showed no malignancy in the right breast, and in the left breast, DCIS, high grade, ER/PR negative. 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    06/16/2020 - 09/01/2021 Chemotherapy   Patient is on Treatment Plan : BREAST Keytruda q 21 days     09/22/2020 Genetic Testing   Negative genetic testing. No pathogenic variants identified on the [facility] Common Hereditary Cancers Panel. The report  date is 09/22/2020.  The Common Hereditary Cancers Panel offered by [facility] includes sequencing and/or deletion duplication testing of the following 48 genes: APC, ATM, AXIN2, BARD1, BMPR1A, BRCA1, BRCA2, BRIP1, CDH1, CDKN2A (p14ARF), CDKN2A (p16INK4a), CKD4, CHEK2, CTNNA1, DICER1, EPCAM (Deletion/duplication testing only), GREM1 (promoter region deletion/duplication testing only), KIT, MEN1, MLH1, MSH2, MSH3, MSH6, MUTYH, NBN, NF1, NHTL1, PALB2, PDGFRA, PMS2, POLD1, POLE, PTEN, RAD50, RAD51C, RAD51D, RNF43, SDHB, SDHC, SDHD, SMAD4, SMARCA4. STK11, TP53, TSC1, TSC2, and VHL.  The following genes were evaluated for sequence changes only: SDHA and HOXB13 c.251G>A variant only.      INTERVAL HISTORY:   Jo Mills is a 72 y.o. female presenting to clinic today for follow up of triple negative left breast cancer. She was last seen by me on 02/08/23.  Today, she states that she is doing well overall. Her appetite level is at 100%. Her energy level is at 90%.  PAST MEDICAL HISTORY:   Past Medical History: Past Medical History:  Diagnosis Date   Allergy    Breast cancer (HCC)  Cancer [facility])    Diverticulitis    Family history of breast cancer 08/17/2020   Family history of pancreatic cancer 08/17/2020   GERD (gastroesophageal reflux disease)    Hypertension    Hypothyroidism    Personal history of chemotherapy    Personal history of radiation therapy    Skin cancer     Surgical History: Past Surgical History:  Procedure Laterality Date   BREAST BIOPSY Left 05/14/2020   x2   BREAST BIOPSY Bilateral 05/05/2020   BREAST LUMPECTOMY Left 01/12/2021   BREAST LUMPECTOMY WITH RADIOACTIVE SEED LOCALIZATION Left 01/12/2021   Procedure: LEFT BREAST LUMPECTOMY X 2 WITH RADIOACTIVE SEED LOCALIZATION;  Surgeon: Manus Rudd, MD;  Location: [facility];  Service: General;  Laterality: Left;   COLONOSCOPY     PORTACATH PLACEMENT N/A 06/15/2020   Procedure: INSERTION PORT-A-CATH WITH  ULTRASOUND GUIDANCE;  Surgeon: Manus Rudd, MD;  Location: [facility];  Service: General;  Laterality: Right;  LMA, LEAVE PORT ACCESSED-CHEMO START 9/1   SENTINEL NODE BIOPSY N/A 01/12/2021   Procedure: SENTINEL NODE BIOPSY;  Surgeon: Manus Rudd, MD;  Location: [facility];  Service: General;  Laterality: N/A;    Social History: Social History   Socioeconomic History   Marital status: Married    Spouse name: Not on file   Number of children: Not on file   Years of education: Not on file   Highest education level: Not on file  Occupational History   Occupation: High school librarian  Tobacco Use   Smoking status: Never   Smokeless tobacco: Never  Substance and Sexual Activity   Alcohol use: Not Currently   Drug use: Never   Sexual activity: Not on file  Other Topics Concern   Not on file  Social History Narrative   Not on file   Social Determinants of Health   Financial Resource Strain: Low Risk  (06/23/2020)   Overall Financial Resource Strain (CARDIA)    Difficulty of Paying Living Expenses: Not hard at all  Food Insecurity: No Food Insecurity (06/23/2020)   Hunger Vital Sign    Worried About Running Out of Food in the Last Year: Never true    Ran Out of Food in the Last Year: Never true  Transportation Needs: No Transportation Needs (06/23/2020)   PRAPARE - Administrator, Civil Service (Medical): No    Lack of Transportation (Non-Medical): No  Physical Activity: Sufficiently Active (06/23/2020)   Exercise Vital Sign    Days of Exercise per Week: 7 days    Minutes of Exercise per Session: 60 min  Stress: Stress Concern Present (06/23/2020)   [employer] of Occupational Health - Occupational Stress Questionnaire    Feeling of Stress : To some extent  Social Connections: Moderately Integrated (06/23/2020)   Social Connection and Isolation Panel [NHANES]    Frequency of Communication with Friends and Family: Once a week     Frequency of Social Gatherings with Friends and Family: Never    Attends Religious Services: More than 4 times per year    Active Member of [employer] or Organizations: Yes    Attends Banker Meetings: 1 to 4 times per year    Marital Status: Married  Catering manager Violence: Not At Risk (06/23/2020)   Humiliation, Afraid, Rape, and Kick questionnaire    Fear of Current or Ex-Partner: No    Emotionally Abused: No    Physically Abused: No    Sexually Abused: No  Family History: Family History  Problem Relation Age of Onset   Diverticulitis Mother    Hypertension Maternal Aunt    Hypertension Maternal Uncle    Cancer Paternal Grandmother        unknown type; d. 4s   Breast cancer Cousin 8       paternal. recurred for 2nd time.   Breast cancer Cousin 50   Pancreatic cancer Cousin        paternal; dx 76s    Current Medications:  Current Outpatient Medications:    amLODipine-benazepril (LOTREL) 5-20 MG capsule, Take 1 capsule by mouth daily., Disp: , Rfl:    Bismuth Subsalicylate (PEPTO-BISMOL PO), Take by mouth as needed., Disp: , Rfl:    cholecalciferol (VITAMIN D3) 25 MCG (1000 UNIT) tablet, Take 1,000 Units by mouth daily., Disp: , Rfl:    fexofenadine (ALLEGRA) 180 MG tablet, Take 180 mg by mouth daily., Disp: , Rfl:    fluticasone (FLONASE) 50 MCG/ACT nasal spray, Place into both nostrils daily., Disp: , Rfl:    Lactobacillus-Inulin (CULTURELLE DIGESTIVE DAILY) CAPS, Take 1 capsule by mouth daily., Disp: , Rfl:    levothyroxine (SYNTHROID) 100 MCG tablet, TAKE 1 TABLET BY MOUTH ONCE DAILY BEFORE BREAKFAST, Disp: 30 tablet, Rfl: 3   omeprazole (PRILOSEC) 20 MG capsule, Take 20 mg by mouth daily., Disp: , Rfl:    Allergies: Allergies  Allergen Reactions   Antihistamines, Chlorpheniramine-Type Rash   Grapefruit Extract Other (See Comments)    Cannot take with blood pressure medications   Pseudoephedrine Hcl Other (See Comments)    Other Reaction: Other  reaction   Nickel    Pineapple    Amoxicillin Rash and Hives    REVIEW OF SYSTEMS:   Review of Systems  Constitutional:  Negative for chills, fatigue and fever.  HENT:   Negative for lump/mass, mouth sores, nosebleeds, sore throat and trouble swallowing.   Eyes:  Negative for eye problems.  Respiratory:  Positive for shortness of breath. Negative for cough.   Cardiovascular:  Negative for chest pain, leg swelling and palpitations.  Gastrointestinal:  Positive for constipation and diarrhea. Negative for abdominal pain, nausea and vomiting.  Genitourinary:  Negative for bladder incontinence, difficulty urinating, dysuria, frequency, hematuria and nocturia.   Musculoskeletal:  Negative for arthralgias, back pain, flank pain, myalgias and neck pain.  Skin:  Negative for itching and rash.  Neurological:  Positive for dizziness and headaches. Negative for numbness.  Hematological:  Does not bruise/bleed easily.  Psychiatric/Behavioral:  Positive for depression. Negative for sleep disturbance and suicidal ideas. The patient is nervous/anxious.   All other systems reviewed and are negative.    VITALS:   Blood pressure 129/78, pulse (!) 58, temperature 98.5 F (36.9 C), temperature source Oral, resp. rate 17, weight 164 lb 9.6 oz (74.7 kg), SpO2 96%.  Wt Readings from Last 3 Encounters:  07/02/23 164 lb 9.6 oz (74.7 kg)  02/08/23 162 lb 1.6 oz (73.5 kg)  11/08/22 162 lb 1.6 oz (73.5 kg)    Body mass index is 28.45 kg/m.  Performance status (ECOG): 1 - Symptomatic but completely ambulatory  PHYSICAL EXAM:   Physical Exam Vitals and nursing note reviewed. Exam conducted with a chaperone present.  Constitutional:      Appearance: Normal appearance.  Cardiovascular:     Rate and Rhythm: Normal rate and regular rhythm.     Pulses: Normal pulses.     Heart sounds: Normal heart sounds.  Pulmonary:     Effort: Pulmonary effort  is normal.     Breath sounds: Normal breath sounds.   Abdominal:     Palpations: Abdomen is soft. There is no hepatomegaly, splenomegaly or mass.     Tenderness: There is no abdominal tenderness.  Musculoskeletal:     Right lower leg: No edema.     Left lower leg: No edema.  Lymphadenopathy:     Cervical: No cervical adenopathy.     Right cervical: No superficial, deep or posterior cervical adenopathy.    Left cervical: No superficial, deep or posterior cervical adenopathy.     Upper Body:     Right upper body: No supraclavicular or axillary adenopathy.     Left upper body: No supraclavicular or axillary adenopathy.  Neurological:     General: No focal deficit present.     Mental Status: She is alert and oriented to person, place, and time.  Psychiatric:        Mood and Affect: Mood normal.        Behavior: Behavior normal.     LABS:      Latest Ref Rng & Units 06/21/2023    2:56 PM 02/01/2023    1:30 PM 10/17/2022    1:16 PM  CBC  WBC 4.0 - 10.5 K/uL 6.2  6.3  6.3   Hemoglobin 12.0 - 15.0 g/dL 16.1  09.6  04.5   Hematocrit 36.0 - 46.0 % 42.1  40.3  42.1   Platelets 150 - 400 K/uL 242  216  247       Latest Ref Rng & Units 06/21/2023    2:56 PM 02/01/2023    1:30 PM 10/17/2022    1:16 PM  CMP  Glucose 70 - 99 mg/dL 97  82  91   BUN 8 - 23 mg/dL 14  16  10    Creatinine 0.44 - 1.00 mg/dL 4.09  8.11  9.14   Sodium 135 - 145 mmol/L 137  137  136   Potassium 3.5 - 5.1 mmol/L 3.6  3.5  3.5   Chloride 98 - 111 mmol/L 101  103  100   CO2 22 - 32 mmol/L 25  25  27    Calcium 8.9 - 10.3 mg/dL 8.9  9.1  9.3   Total Protein 6.5 - 8.1 g/dL 6.6  6.7  7.2   Total Bilirubin 0.3 - 1.2 mg/dL 0.5  0.5  0.7   Alkaline Phos 38 - 126 U/L 95  79  87   AST 15 - 41 U/L 26  28  24    ALT 0 - 44 U/L 30  26  24       No results found for: "CEA1", "CEA" / No results found for: "CEA1", "CEA" No results found for: "PSA1" No results found for: "NWG956" No results found for: "CAN125"  No results found for: "TOTALPROTELP", "ALBUMINELP", "A1GS", "A2GS",  "BETS", "BETA2SER", "GAMS", "MSPIKE", "SPEI" No results found for: "TIBC", "FERRITIN", "IRONPCTSAT" No results found for: "LDH"   STUDIES:   No results found.

## 2023-07-02 ENCOUNTER — Inpatient Hospital Stay: Payer: BC Managed Care – PPO | Admitting: Hematology

## 2023-07-02 ENCOUNTER — Other Ambulatory Visit: Payer: Self-pay

## 2023-07-02 ENCOUNTER — Other Ambulatory Visit: Payer: Self-pay | Admitting: Hematology

## 2023-07-02 VITALS — BP 129/78 | HR 58 | Temp 98.5°F | Resp 17 | Wt 164.6 lb

## 2023-07-02 DIAGNOSIS — Z171 Estrogen receptor negative status [ER-]: Secondary | ICD-10-CM

## 2023-07-02 DIAGNOSIS — C50812 Malignant neoplasm of overlapping sites of left female breast: Secondary | ICD-10-CM | POA: Diagnosis not present

## 2023-07-02 NOTE — Patient Instructions (Addendum)
[facility] at [facility] Discharge Instructions   You were seen and examined today by Dr. Ellin Saba.  He reviewed the results of your lab work which are normal/stable.   We will see you back in 6 months . We will repeat lab work prior to this visit.   Return as scheduled.    Thank you for choosing [facility] at [facility] to provide your oncology and hematology care.  To afford each patient quality time with our provider, please arrive at least 15 minutes before your scheduled appointment time.   If you have a lab appointment with the Cancer Center please come in thru the Main Entrance and check in at the main information desk.  You need to re-schedule your appointment should you arrive 10 or more minutes late.  We strive to give you quality time with our providers, and arriving late affects you and other patients whose appointments are after yours.  Also, if you no show three or more times for appointments you may be dismissed from the clinic at the providers discretion.     Again, thank you for choosing [facility]  Our hope is that these requests will decrease the amount of time that you wait before being seen by our physicians.       _____________________________________________________________  Should you have questions after your visit to [facility], please contact our office at [phone] and follow the prompts.  Our office hours are 8:00 a.m. and 4:30 p.m. Monday - Friday.  Please note that voicemails left after 4:00 p.m. may not be returned until the following business day.  We are closed weekends and major holidays.  You do have access to a nurse 24-7, just call the main number to the clinic [phone] and do not press any options, hold on the line and a nurse will answer the phone.    For prescription refill requests, have your pharmacy contact our office and allow 72 hours.    Due to Covid, you  will need to wear a mask upon entering the hospital. If you do not have a mask, a mask will be given to you at the Main Entrance upon arrival. For doctor visits, patients may have 1 support person age 54 or older with them. For treatment visits, patients can not have anyone with them due to social distancing guidelines and our immunocompromised population.

## 2023-10-23 ENCOUNTER — Other Ambulatory Visit (HOSPITAL_COMMUNITY): Payer: BC Managed Care – PPO

## 2023-10-23 ENCOUNTER — Encounter (HOSPITAL_COMMUNITY): Payer: BC Managed Care – PPO

## 2023-12-11 ENCOUNTER — Encounter (HOSPITAL_COMMUNITY): Payer: Self-pay

## 2023-12-11 ENCOUNTER — Inpatient Hospital Stay: Payer: BC Managed Care – PPO | Attending: Hematology

## 2023-12-11 ENCOUNTER — Ambulatory Visit (HOSPITAL_COMMUNITY)
Admission: RE | Admit: 2023-12-11 | Discharge: 2023-12-11 | Disposition: A | Discharge status: Home / Self Care | Payer: BC Managed Care – PPO | Source: Ambulatory Visit | Attending: Hematology | Admitting: Hematology

## 2023-12-11 DIAGNOSIS — Z171 Estrogen receptor negative status [ER-]: Secondary | ICD-10-CM | POA: Insufficient documentation

## 2023-12-11 DIAGNOSIS — Z800 Family history of malignant neoplasm of digestive organs: Secondary | ICD-10-CM | POA: Diagnosis not present

## 2023-12-11 DIAGNOSIS — Z79899 Other long term (current) drug therapy: Secondary | ICD-10-CM | POA: Diagnosis not present

## 2023-12-11 DIAGNOSIS — R978 Other abnormal tumor markers: Secondary | ICD-10-CM | POA: Diagnosis not present

## 2023-12-11 DIAGNOSIS — Z803 Family history of malignant neoplasm of breast: Secondary | ICD-10-CM | POA: Insufficient documentation

## 2023-12-11 DIAGNOSIS — C50812 Malignant neoplasm of overlapping sites of left female breast: Secondary | ICD-10-CM

## 2023-12-11 DIAGNOSIS — Z8249 Family history of ischemic heart disease and other diseases of the circulatory system: Secondary | ICD-10-CM | POA: Insufficient documentation

## 2023-12-11 DIAGNOSIS — Z809 Family history of malignant neoplasm, unspecified: Secondary | ICD-10-CM | POA: Diagnosis not present

## 2023-12-11 DIAGNOSIS — Z8379 Family history of other diseases of the digestive system: Secondary | ICD-10-CM | POA: Insufficient documentation

## 2023-12-11 LAB — TSH: TSH: 1.723 u[IU]/mL (ref 0.350–4.500)

## 2023-12-11 LAB — COMPREHENSIVE METABOLIC PANEL
ALT: 23 U/L (ref 0–44)
AST: 25 U/L (ref 15–41)
Albumin: 4.1 g/dL (ref 3.5–5.0)
Alkaline Phosphatase: 71 U/L (ref 38–126)
Anion gap: 11 (ref 5–15)
BUN: 8 mg/dL (ref 8–23)
CO2: 23 mmol/L (ref 22–32)
Calcium: 9.5 mg/dL (ref 8.9–10.3)
Chloride: 103 mmol/L (ref 98–111)
Creatinine, Ser: 0.77 mg/dL (ref 0.44–1.00)
GFR, Estimated: >60 mL/min (ref >60)
Glucose, Bld: 97 mg/dL (ref 70–99)
Potassium: 4 mmol/L (ref 3.5–5.1)
Sodium: 137 mmol/L (ref 135–145)
Total Bilirubin: 0.6 mg/dL (ref 0.0–1.2)
Total Protein: 7 g/dL (ref 6.5–8.1)

## 2023-12-11 LAB — CBC WITH DIFFERENTIAL/PLATELET
Abs Immature Granulocytes: 0.02 10*3/uL (ref 0.00–0.07)
Basophils Absolute: 0.1 10*3/uL (ref 0.0–0.1)
Basophils Relative: 1 %
Eosinophils Absolute: 0.2 10*3/uL (ref 0.0–0.5)
Eosinophils Relative: 3 %
HCT: 40.5 % (ref 36.0–46.0)
Hemoglobin: 13.7 g/dL (ref 12.0–15.0)
Immature Granulocytes: 0 %
Lymphocytes Relative: 43 %
Lymphs Abs: 3.1 10*3/uL (ref 0.7–4.0)
MCH: 31.7 pg (ref 26.0–34.0)
MCHC: 33.8 g/dL (ref 30.0–36.0)
MCV: 93.8 fL (ref 80.0–100.0)
Monocytes Absolute: 0.5 10*3/uL (ref 0.1–1.0)
Monocytes Relative: 6 %
Neutro Abs: 3.4 10*3/uL (ref 1.7–7.7)
Neutrophils Relative %: 47 %
Platelets: 260 10*3/uL (ref 150–400)
RBC: 4.32 MIL/uL (ref 3.87–5.11)
RDW: 13.2 % (ref 11.5–15.5)
WBC: 7.2 10*3/uL (ref 4.0–10.5)
nRBC: 0 % (ref 0.0–0.2)

## 2023-12-12 LAB — CANCER ANTIGEN 27.29: CA 27.29: 26 U/mL (ref 0.0–38.6)

## 2023-12-12 LAB — CANCER ANTIGEN 15-3: CA 15-3: 23.8 U/mL (ref 0.0–25.0)

## 2023-12-24 ENCOUNTER — Inpatient Hospital Stay: Payer: BC Managed Care – PPO

## 2023-12-31 ENCOUNTER — Inpatient Hospital Stay: Payer: BC Managed Care – PPO | Attending: Hematology | Admitting: Hematology

## 2023-12-31 VITALS — BP 133/76 | HR 63 | Temp 97.3°F | Resp 16 | Wt 162.3 lb

## 2023-12-31 DIAGNOSIS — Z803 Family history of malignant neoplasm of breast: Secondary | ICD-10-CM | POA: Diagnosis not present

## 2023-12-31 DIAGNOSIS — Z880 Allergy status to penicillin: Secondary | ICD-10-CM | POA: Insufficient documentation

## 2023-12-31 DIAGNOSIS — Z79899 Other long term (current) drug therapy: Secondary | ICD-10-CM | POA: Diagnosis not present

## 2023-12-31 DIAGNOSIS — C50212 Malignant neoplasm of upper-inner quadrant of left female breast: Secondary | ICD-10-CM | POA: Insufficient documentation

## 2023-12-31 DIAGNOSIS — Z8249 Family history of ischemic heart disease and other diseases of the circulatory system: Secondary | ICD-10-CM | POA: Diagnosis not present

## 2023-12-31 DIAGNOSIS — Z171 Estrogen receptor negative status [ER-]: Secondary | ICD-10-CM | POA: Insufficient documentation

## 2023-12-31 DIAGNOSIS — C50812 Malignant neoplasm of overlapping sites of left female breast: Secondary | ICD-10-CM

## 2023-12-31 DIAGNOSIS — Z85828 Personal history of other malignant neoplasm of skin: Secondary | ICD-10-CM | POA: Diagnosis not present

## 2023-12-31 DIAGNOSIS — Z79890 Hormone replacement therapy (postmenopausal): Secondary | ICD-10-CM | POA: Insufficient documentation

## 2023-12-31 DIAGNOSIS — Z800 Family history of malignant neoplasm of digestive organs: Secondary | ICD-10-CM | POA: Diagnosis not present

## 2023-12-31 DIAGNOSIS — Z9221 Personal history of antineoplastic chemotherapy: Secondary | ICD-10-CM | POA: Diagnosis not present

## 2023-12-31 DIAGNOSIS — Z1722 Progesterone receptor negative status: Secondary | ICD-10-CM | POA: Diagnosis not present

## 2023-12-31 DIAGNOSIS — Z1732 Human epidermal growth factor receptor 2 negative status: Secondary | ICD-10-CM | POA: Diagnosis not present

## 2023-12-31 DIAGNOSIS — Z8379 Family history of other diseases of the digestive system: Secondary | ICD-10-CM | POA: Insufficient documentation

## 2023-12-31 DIAGNOSIS — E032 Hypothyroidism due to medicaments and other exogenous substances: Secondary | ICD-10-CM

## 2023-12-31 NOTE — Progress Notes (Signed)
 [facility] [address] [place] [address]    Clinic Day:  12/31/2023  Referring physician: Virgina Norfolk, MD  Patient Care Team: Virgina Norfolk, MD as PCP - General (Nurse Practitioner) Donnelly Angelica, RN as Oncology Nurse Navigator Pershing Proud, RN as Oncology Nurse Navigator Therese Sarah, RN as Oncology Nurse Navigator (Oncology)   ASSESSMENT & PLAN:   Assessment: 1.  Stage II (T2N0) left breast TNBC: -Felt left breast mass for 5 months. -Mammogram on 05/07/2020 with suspicious mass in the 10:30 o'clock position in the left breast.  Suspicious calcifications in the anterior lower inner quadrant of the left breast. -MRI on 06/05/2020 shows left breast mass measuring 3.8 x 3.3 x 3.2 cm in the upper inner quadrant.  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  No suspicious lymphadenopathy.  No MRI evidence of malignancy on the right. -Left breast upper inner quadrant biopsy on 05/14/2020 showed DCIS with calcifications.  Right breast biopsy shows fibroadenomatoid nodule with calcifications.  ER/PR was negative. -Left breast core needle biopsy at 10:30 position shows invasive ductal carcinoma.  ER/PR/HER-2 was negative.  Ki-67 85%.  Left lymph node biopsy was reactive. -Patient was evaluated by Dr. Pamelia Hoit and Dr. Corliss Skains. -She was recommended chemotherapy with Adriamycin and cyclophosphamide with pembrolizumab (keynote-522 trial) added for improved PCR by about 15%. -Cycle 1 of Adriamycin, cyclophosphamide and pembrolizumab on 06/16/2020. - Left lumpectomy and SLNB on 01/12/2021, YPT0, ypN0. - Adjuvant Keytruda 9 cycles from 02/15/2021 through 09/01/2021.   2.  Family history: -Paternal cousin and second cousin had breast cancer.  Paternal grandmother also had some type of cancer. -Another paternal cousin died of pancreatic cancer.  Another paternal cousin had leukemia. - Germline mutation testing was negative for BRCA1/2.     Plan: 1.  Triple negative left breast cancer: - Mammogram on 12/11/2023: BI-RADS Category 2.  She does not report any new onset pains. - Labs from 12/11/2023: Normal LFTs and creatinine.  CBC was normal.  CA 15-3 and CA 27-29 were normal at 23 and 26 respectively. - Physical exam: Lumpectomy scar in the medial margin of the areola of the left breast is stable.  No palpable masses in both breast.  No palpable adenopathy. - Recommend follow-up in 6 months with repeat labs.   2.  High risk drug monitoring: - TSH is 1.7.  Continue Synthroid 100 mcg daily.    No orders of the defined types were placed in this encounter.     I,Katie Daubenspeck,acting as a Neurosurgeon for [name], MD.,have documented all relevant documentation on the behalf of [name], MD,as directed by  [name], MD while in the presence of [name], MD.   I, [name] MD, have reviewed the above documentation for accuracy and completeness, and I agree with the above.   [name], MD   3/17/202512:01 PM  CHIEF COMPLAINT:   Diagnosis: triple negative left breast cancer    Cancer Staging  Malignant neoplasm of overlapping sites of left breast in female, estrogen receptor negative (HCC) Staging form: Breast, AJCC 8th Edition - Clinical stage from 05/17/2020: Stage IIB (cT2, cN0, cM0, G3, ER-, PR-, HER2-) - Signed by Loa Socks, NP on 05/26/2020    Prior Therapy: 1. Neoadjuvant Adriamycin, cyclophosphamide and pembrolizumab 06/16/20 - 08/18/20 2. Neoadjuvant carboplatin and taxol 09/08/20 - 12/08/20 3. Left lumpectomy and SLNB on 01/12/2021 4. Adjuvant Keytruda 9 cycles from 02/15/2021 through 09/01/2021  Current Therapy:  surveillance    HISTORY OF PRESENT ILLNESS:   Oncology History  Malignant neoplasm of overlapping sites of left breast in female, estrogen receptor negative (HCC)  05/17/2020 Cancer Staging   Staging form: Breast, AJCC 8th  Edition - Clinical stage from 05/17/2020: Stage IIB (cT2, cN0, cM0, G3, ER-, PR-, HER2-) - Signed by Loa Socks, NP on 05/26/2020   05/26/2020 Initial Diagnosis   Patient palpated a left breast mass for 5 months. Mammogram and US showed a 2.7cm mass at the 10:30 position, one left axillary lymph node with cortical thickening, calcifications in the lower inner left breast, and a 0.4cm group of calcifications in the right breast. Biopsy on 05/14/20 showed no malignancy in the right breast, and in the left breast, DCIS, high grade, ER/PR negative. 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    06/16/2020 - 09/01/2021 Chemotherapy   Patient is on Treatment Plan : BREAST Keytruda q 21 days     09/22/2020 Genetic Testing   Negative genetic testing. No pathogenic variants identified on the [facility] Common Hereditary Cancers Panel. The report date is 09/22/2020.  The Common Hereditary Cancers Panel offered by [facility] includes sequencing and/or deletion duplication testing of the following 48 genes: APC, ATM, AXIN2, BARD1, BMPR1A, BRCA1, BRCA2, BRIP1, CDH1, CDKN2A (p14ARF), CDKN2A (p16INK4a), CKD4, CHEK2, CTNNA1, DICER1, EPCAM (Deletion/duplication testing only), GREM1 (promoter region deletion/duplication testing only), KIT, MEN1, MLH1, MSH2, MSH3, MSH6, MUTYH, NBN, NF1, NHTL1, PALB2, PDGFRA, PMS2, POLD1, POLE, PTEN, RAD50, RAD51C, RAD51D, RNF43, SDHB, SDHC, SDHD, SMAD4, SMARCA4. STK11, TP53, TSC1, TSC2, and VHL.  The following genes were evaluated for sequence changes only: SDHA and HOXB13 c.251G>A variant only.      INTERVAL HISTORY:   Jo Mills is a 73 y.o. female presenting to clinic today for follow up of triple negative left breast cancer. She was last seen by me on 02/08/23.  Today, she states that she is doing well overall. Her appetite level is at 100%. Her  energy level is at 90%.  PAST MEDICAL HISTORY:   Past Medical History: Past Medical History:  Diagnosis Date   Allergy    Breast cancer (HCC) 2022   left   Cancer (HCC)    Diverticulitis    Family history of breast cancer 08/17/2020   Family history of pancreatic cancer 08/17/2020   GERD (gastroesophageal reflux disease)    Hypertension    Hypothyroidism    Personal history of chemotherapy    Personal history of radiation therapy    Skin cancer     Surgical History: Past Surgical History:  Procedure Laterality Date   BREAST BIOPSY Left 05/14/2020   x2   BREAST BIOPSY Bilateral 05/05/2020   BREAST LUMPECTOMY Left 01/12/2021   BREAST LUMPECTOMY WITH RADIOACTIVE SEED LOCALIZATION Left 01/12/2021   Procedure: LEFT BREAST LUMPECTOMY X 2 WITH RADIOACTIVE SEED LOCALIZATION;  Surgeon: Manus Rudd, MD;  Location: [facility];  Service: General;  Laterality: Left;   COLONOSCOPY     PORTACATH PLACEMENT N/A 06/15/2020   Procedure: INSERTION PORT-A-CATH WITH ULTRASOUND GUIDANCE;  Surgeon: Manus Rudd, MD;  Location: [facility];  Service: General;  Laterality: Right;  LMA, LEAVE PORT ACCESSED-CHEMO START 9/1   SENTINEL NODE BIOPSY N/A 01/12/2021   Procedure: SENTINEL NODE BIOPSY;  Surgeon: Manus Rudd, MD;  Location: [facility];  Service: General;  Laterality: N/A;    Social History: Social History   Socioeconomic History   Marital status: Married    Spouse name: Not on file   Number of children: Not on file   Years of education: Not on file   Highest education level: Not on file  Occupational History   Occupation: High school librarian  Tobacco Use   Smoking status: Never   Smokeless tobacco: Never  Substance and Sexual Activity   Alcohol use: Not Currently   Drug use: Never   Sexual activity: Not on file  Other Topics Concern   Not on file  Social History Narrative   Not on file   Social Drivers of Health    Financial Resource Strain: Low Risk  (06/23/2020)   Overall Financial Resource Strain (CARDIA)    Difficulty of Paying Living Expenses: Not hard at all  Food Insecurity: No Food Insecurity (06/23/2020)   Hunger Vital Sign    Worried About Running Out of Food in the Last Year: Never true    Ran Out of Food in the Last Year: Never true  Transportation Needs: No Transportation Needs (06/23/2020)   PRAPARE - Administrator, Civil Service (Medical): No    Lack of Transportation (Non-Medical): No  Physical Activity: Sufficiently Active (06/23/2020)   Exercise Vital Sign    Days of Exercise per Week: 7 days    Minutes of Exercise per Session: 60 min  Stress: Stress Concern Present (06/23/2020)   [employer] of Occupational Health - Occupational Stress Questionnaire    Feeling of Stress : To some extent  Social Connections: Moderately Integrated (06/23/2020)   Social Connection and Isolation Panel [NHANES]    Frequency of Communication with Friends and Family: Once a week    Frequency of Social Gatherings with Friends and Family: Never    Attends Religious Services: More than 4 times per year    Active Member of [employer] or Organizations: Yes    Attends Banker Meetings: 1 to 4 times per year    Marital Status: Married  Catering manager Violence: Not At Risk (06/23/2020)   Humiliation, Afraid, Rape, and Kick questionnaire    Fear of Current or Ex-Partner: No    Emotionally Abused: No    Physically Abused: No    Sexually Abused: No    Family History: Family History  Problem Relation Age of Onset   Diverticulitis Mother    Hypertension Maternal Aunt    Hypertension Maternal Uncle    Cancer Paternal Grandmother        unknown type; d. 49s   Breast cancer Cousin 62       paternal. recurred for 2nd time.   Breast cancer Cousin 53   Pancreatic cancer Cousin        paternal; dx 33s    Current Medications:  Current Outpatient Medications:    amLODipine-benazepril  (LOTREL) 5-20 MG capsule, Take 1 capsule by mouth daily., Disp: , Rfl:    aspirin EC 81 MG tablet, Take 81 mg by mouth daily. Swallow whole., Disp: , Rfl:    Bismuth Subsalicylate (PEPTO-BISMOL PO), Take by mouth as needed., Disp: , Rfl:    cholecalciferol (VITAMIN D3) 25 MCG (1000 UNIT) tablet, Take 1,000 Units by mouth daily., Disp: , Rfl:    CO-ENZYME Q-10 PO, Take by mouth., Disp: , Rfl:    fexofenadine (ALLEGRA) 180 MG tablet, Take 180 mg by mouth daily., Disp: , Rfl:    FIBER GUMMIES PO, Take by  mouth., Disp: , Rfl:    fluticasone (FLONASE) 50 MCG/ACT nasal spray, Place into both nostrils daily., Disp: , Rfl:    Lactobacillus-Inulin (CULTURELLE DIGESTIVE DAILY) CAPS, Take 1 capsule by mouth daily., Disp: , Rfl:    levothyroxine (SYNTHROID) 100 MCG tablet, TAKE 1 TABLET BY MOUTH ONCE DAILY BEFORE BREAKFAST, Disp: 30 tablet, Rfl: 3   Multiple Vitamin (MULTIVITAMIN ADULT PO), Take by mouth., Disp: , Rfl:    omeprazole (PRILOSEC) 20 MG capsule, Take 20 mg by mouth daily., Disp: , Rfl:    Allergies: Allergies  Allergen Reactions   Antihistamines, Chlorpheniramine-Type Rash   Grapefruit Extract Other (See Comments)    Cannot take with blood pressure medications   Pseudoephedrine Hcl Other (See Comments)    Other Reaction: Other reaction   Nickel    Pineapple    Amoxicillin Rash and Hives    REVIEW OF SYSTEMS:   Review of Systems  Constitutional:  Negative for chills, fatigue and fever.  HENT:   Negative for lump/mass, mouth sores, nosebleeds, sore throat and trouble swallowing.   Eyes:  Negative for eye problems.  Respiratory:  Positive for shortness of breath. Negative for cough.   Cardiovascular:  Negative for chest pain, leg swelling and palpitations.  Gastrointestinal:  Positive for constipation and diarrhea. Negative for abdominal pain, nausea and vomiting.  Genitourinary:  Negative for bladder incontinence, difficulty urinating, dysuria, frequency, hematuria and nocturia.    Musculoskeletal:  Negative for arthralgias, back pain, flank pain, myalgias and neck pain.  Skin:  Negative for itching and rash.  Neurological:  Positive for dizziness and headaches. Negative for numbness.  Hematological:  Does not bruise/bleed easily.  Psychiatric/Behavioral:  Positive for depression. Negative for sleep disturbance and suicidal ideas. The patient is nervous/anxious.   All other systems reviewed and are negative.    VITALS:   Weight 162 lb 4.1 oz (73.6 kg).  Wt Readings from Last 3 Encounters:  12/31/23 162 lb 4.1 oz (73.6 kg)  07/02/23 164 lb 9.6 oz (74.7 kg)  02/08/23 162 lb 1.6 oz (73.5 kg)    Body mass index is 28.04 kg/m.  Performance status (ECOG): 1 - Symptomatic but completely ambulatory  PHYSICAL EXAM:   Physical Exam Vitals and nursing note reviewed. Exam conducted with a chaperone present.  Constitutional:      Appearance: Normal appearance.  Cardiovascular:     Rate and Rhythm: Normal rate and regular rhythm.     Pulses: Normal pulses.     Heart sounds: Normal heart sounds.  Pulmonary:     Effort: Pulmonary effort is normal.     Breath sounds: Normal breath sounds.  Abdominal:     Palpations: Abdomen is soft. There is no hepatomegaly, splenomegaly or mass.     Tenderness: There is no abdominal tenderness.  Musculoskeletal:     Right lower leg: No edema.     Left lower leg: No edema.  Lymphadenopathy:     Cervical: No cervical adenopathy.     Right cervical: No superficial, deep or posterior cervical adenopathy.    Left cervical: No superficial, deep or posterior cervical adenopathy.     Upper Body:     Right upper body: No supraclavicular or axillary adenopathy.     Left upper body: No supraclavicular or axillary adenopathy.  Neurological:     General: No focal deficit present.     Mental Status: She is alert and oriented to person, place, and time.  Psychiatric:        Mood  and Affect: Mood normal.        Behavior: Behavior normal.      LABS:      Latest Ref Rng & Units 12/11/2023    3:18 PM 06/21/2023    2:56 PM 02/01/2023    1:30 PM  CBC  WBC 4.0 - 10.5 K/uL 7.2  6.2  6.3   Hemoglobin 12.0 - 15.0 g/dL 40.9  81.1  91.4   Hematocrit 36.0 - 46.0 % 40.5  42.1  40.3   Platelets 150 - 400 K/uL 260  242  216       Latest Ref Rng & Units 12/11/2023    3:18 PM 06/21/2023    2:56 PM 02/01/2023    1:30 PM  CMP  Glucose 70 - 99 mg/dL 97  97  82   BUN 8 - 23 mg/dL 8  14  16    Creatinine 0.44 - 1.00 mg/dL 7.82  9.56  2.13   Sodium 135 - 145 mmol/L 137  137  137   Potassium 3.5 - 5.1 mmol/L 4.0  3.6  3.5   Chloride 98 - 111 mmol/L 103  101  103   CO2 22 - 32 mmol/L 23  25  25    Calcium 8.9 - 10.3 mg/dL 9.5  8.9  9.1   Total Protein 6.5 - 8.1 g/dL 7.0  6.6  6.7   Total Bilirubin 0.0 - 1.2 mg/dL 0.6  0.5  0.5   Alkaline Phos 38 - 126 U/L 71  95  79   AST 15 - 41 U/L 25  26  28    ALT 0 - 44 U/L 23  30  26       No results found for: "CEA1", "CEA" / No results found for: "CEA1", "CEA" No results found for: "PSA1" No results found for: "YQM578" No results found for: "CAN125"  No results found for: "TOTALPROTELP", "ALBUMINELP", "A1GS", "A2GS", "BETS", "BETA2SER", "GAMS", "MSPIKE", "SPEI" No results found for: "TIBC", "FERRITIN", "IRONPCTSAT" No results found for: "LDH"   STUDIES:   MM DIAG BREAST TOMO BILATERAL Result Date: 12/11/2023 CLINICAL DATA:  73 year old female with history of left breast cancer post lumpectomy 01/12/2021. EXAM: DIGITAL DIAGNOSTIC BILATERAL MAMMOGRAM WITH TOMOSYNTHESIS AND CAD TECHNIQUE: Bilateral digital diagnostic mammography and breast tomosynthesis was performed. The images were evaluated with computer-aided detection. COMPARISON:  Previous exam(s). ACR Breast Density Category b: There are scattered areas of fibroglandular density. FINDINGS: No suspicious masses or calcifications are seen in either breast. Lumpectomy changes again identified in the upper inner left breast. Spot compression  magnification CC view of the left breast lumpectomy site was performed. There is no mammographic evidence of locally recurrent malignancy. IMPRESSION: Stable lumpectomy changes in the left breast. No mammographic evidence of malignancy in either breast. RECOMMENDATION: 1.  Screening mammogram in one year.(Code:SM-B-01Y) 2. The patient is now two or more years post lumpectomy and therefore may return to annual screening mammography, however given history of breast cancer the patient does remain eligible for annual diagnostic mammography if indicated. I have discussed the findings and recommendations with the patient. If applicable, a reminder letter will be sent to the patient regarding the next appointment. BI-RADS CATEGORY  2: Benign. Electronically Signed   By: Edwin Cap M.D.   On: 12/11/2023 16:04

## 2023-12-31 NOTE — Patient Instructions (Signed)
 [facility] at [facility] Discharge Instructions   You were seen and examined today by Dr. Ellin Saba.  He reviewed the results of your lab work which are normal/stable.   He reviewed the results of your mammogram which are normal.   We will see you back in 6 months. We will repeat lab work prior to this visit.    Return as scheduled.    Thank you for choosing [facility] at [facility] to provide your oncology and hematology care.  To afford each patient quality time with our provider, please arrive at least 15 minutes before your scheduled appointment time.   If you have a lab appointment with the Cancer Center please come in thru the Main Entrance and check in at the main information desk.  You need to re-schedule your appointment should you arrive 10 or more minutes late.  We strive to give you quality time with our providers, and arriving late affects you and other patients whose appointments are after yours.  Also, if you no show three or more times for appointments you may be dismissed from the clinic at the providers discretion.     Again, thank you for choosing [facility].  Our hope is that these requests will decrease the amount of time that you wait before being seen by our physicians.       _____________________________________________________________  Should you have questions after your visit to [facility], please contact our office at [phone] and follow the prompts.  Our office hours are 8:00 a.m. and 4:30 p.m. Monday - Friday.  Please note that voicemails left after 4:00 p.m. may not be returned until the following business day.  We are closed weekends and major holidays.  You do have access to a nurse 24-7, just call the main number to the clinic [phone] and do not press any options, hold on the line and a nurse will answer the phone.    For prescription refill requests, have your pharmacy  contact our office and allow 72 hours.    Due to Covid, you will need to wear a mask upon entering the hospital. If you do not have a mask, a mask will be given to you at the Main Entrance upon arrival. For doctor visits, patients may have 1 support person age 69 or older with them. For treatment visits, patients can not have anyone with them due to social distancing guidelines and our immunocompromised population.

## 2024-06-30 ENCOUNTER — Inpatient Hospital Stay

## 2024-07-01 ENCOUNTER — Inpatient Hospital Stay: Attending: Physician Assistant

## 2024-07-01 DIAGNOSIS — Z853 Personal history of malignant neoplasm of breast: Secondary | ICD-10-CM | POA: Diagnosis present

## 2024-07-01 DIAGNOSIS — E032 Hypothyroidism due to medicaments and other exogenous substances: Secondary | ICD-10-CM

## 2024-07-01 DIAGNOSIS — Z923 Personal history of irradiation: Secondary | ICD-10-CM | POA: Insufficient documentation

## 2024-07-01 DIAGNOSIS — Z9221 Personal history of antineoplastic chemotherapy: Secondary | ICD-10-CM | POA: Diagnosis not present

## 2024-07-01 DIAGNOSIS — Z171 Estrogen receptor negative status [ER-]: Secondary | ICD-10-CM

## 2024-07-01 LAB — COMPREHENSIVE METABOLIC PANEL WITH GFR
ALT: 21 U/L (ref 0–44)
AST: 26 U/L (ref 15–41)
Albumin: 4.1 g/dL (ref 3.5–5.0)
Alkaline Phosphatase: 65 U/L (ref 38–126)
Anion gap: 12 (ref 5–15)
BUN: 14 mg/dL (ref 8–23)
CO2: 24 mmol/L (ref 22–32)
Calcium: 9.3 mg/dL (ref 8.9–10.3)
Chloride: 102 mmol/L (ref 98–111)
Creatinine, Ser: 0.74 mg/dL (ref 0.44–1.00)
GFR, Estimated: >60 mL/min (ref >60)
Glucose, Bld: 86 mg/dL (ref 70–99)
Potassium: 3.7 mmol/L (ref 3.5–5.1)
Sodium: 138 mmol/L (ref 135–145)
Total Bilirubin: 0.9 mg/dL (ref 0.0–1.2)
Total Protein: 6.8 g/dL (ref 6.5–8.1)

## 2024-07-01 LAB — CBC WITH DIFFERENTIAL/PLATELET
Abs Immature Granulocytes: 0.01 K/uL (ref 0.00–0.07)
Basophils Absolute: 0.1 K/uL (ref 0.0–0.1)
Basophils Relative: 1 %
Eosinophils Absolute: 0.2 K/uL (ref 0.0–0.5)
Eosinophils Relative: 3 %
HCT: 43.2 % (ref 36.0–46.0)
Hemoglobin: 14.7 g/dL (ref 12.0–15.0)
Immature Granulocytes: 0 %
Lymphocytes Relative: 41 %
Lymphs Abs: 2.7 K/uL (ref 0.7–4.0)
MCH: 32.1 pg (ref 26.0–34.0)
MCHC: 34 g/dL (ref 30.0–36.0)
MCV: 94.3 fL (ref 80.0–100.0)
Monocytes Absolute: 0.4 K/uL (ref 0.1–1.0)
Monocytes Relative: 6 %
Neutro Abs: 3.2 K/uL (ref 1.7–7.7)
Neutrophils Relative %: 49 %
Platelets: 247 K/uL (ref 150–400)
RBC: 4.58 MIL/uL (ref 3.87–5.11)
RDW: 13.2 % (ref 11.5–15.5)
WBC: 6.5 K/uL (ref 4.0–10.5)
nRBC: 0 % (ref 0.0–0.2)

## 2024-07-01 LAB — TSH: TSH: 1.489 u[IU]/mL (ref 0.350–4.500)

## 2024-07-02 LAB — CANCER ANTIGEN 15-3: CA 15-3: 27.1 U/mL — ABNORMAL HIGH (ref 0.0–25.0)

## 2024-07-02 LAB — CANCER ANTIGEN 27.29: CA 27.29: 33.3 U/mL (ref 0.0–38.6)

## 2024-07-05 NOTE — Progress Notes (Unsigned)
 [facility] [address], [place] [address]   CLINIC:  Medical Oncology/Hematology  PCP:  Sharron Sari CROME, MD [address] 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)  PRIOR THERAPY:  - Neoadjuvant Adriamycin , cyclophosphamide  and pembrolizumab  06/16/20 - 08/18/20 - Neoadjuvant carboplatin  and taxol  09/08/20 - 12/08/20 - Left lumpectomy and SLNB on 01/12/2021 - Adjuvant Keytruda  9 cycles from 02/15/2021 through 09/01/2021 - Completed XRT to left breast and chest wall in [place], [place] ***  CURRENT THERAPY: Surveillance  BRIEF ONCOLOGIC HISTORY:   Oncology History  Malignant neoplasm of overlapping sites of left breast in female, estrogen receptor negative (HCC)  05/17/2020 Cancer Staging   Staging form: Breast, AJCC 8th Edition - Clinical stage from 05/17/2020: Stage IIB (cT2, cN0, cM0, G3, ER-, PR-, HER2-) - Signed by Crawford Morna Pickle, NP on 05/26/2020   05/26/2020 Initial Diagnosis   Patient palpated a left breast mass for 5 months. Mammogram and US  showed a 2.7cm mass at the 10:30 position, one left axillary lymph node with cortical thickening, calcifications in the lower inner left breast, and a 0.4cm group of calcifications in the right breast. Biopsy on 05/14/20 showed no malignancy in the right breast, and in the left breast, DCIS, high grade, ER/PR negative. 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    06/16/2020 - 09/01/2021 Chemotherapy   Patient is on Treatment Plan : BREAST Keytruda  q 21 days     09/22/2020 Genetic Testing   Negative genetic testing. No pathogenic variants identified on the [facility] Common Hereditary Cancers Panel. The report date is 09/22/2020.  The Common Hereditary Cancers Panel offered by [facility] includes  sequencing and/or deletion duplication testing of the following 48 genes: APC, ATM, AXIN2, BARD1, BMPR1A, BRCA1, BRCA2, BRIP1, CDH1, CDKN2A (p14ARF), CDKN2A (p16INK4a), CKD4, CHEK2, CTNNA1, DICER1, EPCAM (Deletion/duplication testing only), GREM1 (promoter region deletion/duplication testing only), KIT, MEN1, MLH1, MSH2, MSH3, MSH6, MUTYH, NBN, NF1, NHTL1, PALB2, PDGFRA, PMS2, POLD1, POLE, PTEN, RAD50, RAD51C, RAD51D, RNF43, SDHB, SDHC, SDHD, SMAD4, SMARCA4. STK11, TP53, TSC1, TSC2, and VHL.  The following genes were evaluated for sequence changes only: SDHA and HOXB13 c.251G>A variant only.     CANCER STAGING: Cancer Staging  Malignant neoplasm of overlapping sites of left breast in female, estrogen receptor negative (HCC) Staging form: Breast, AJCC 8th Edition - Clinical stage from 05/17/2020: Stage IIB (cT2, cN0, cM0, G3, ER-, PR-, HER2-) - Signed by Crawford Morna Pickle, NP on 05/26/2020   INTERVAL HISTORY:   Jo [place], a 73 y.o. female, returns for routine follow-up of her left-sided triple negative breast cancer. Bettejane was last seen on 12/31/2023 by Dr. Rogers.   At today's visit, she  reports feeling ***.   ***She denies any recent hospitalizations, surgeries, or changes in her  baseline health status.  ***She denies any breast lumps or axillary lymphadenopathy. ***No new onset bone pain, chest pain, dyspnea, or abdominal pain. ***She has no new headaches, seizures, or focal neurologic deficits. ***No B symptoms such as fever, chills, night sweats, unintentional weight loss.  She reports ***% energy and ***% appetite.  She is maintaining stable weight at this time.  ASSESSMENT & PLAN:  1.  Stage II (T2 N0) triple negative LEFT breast cancer - Mammogram on 05/07/2020 with  suspicious mass in the 10:30 o'clock position in the left breast.  Suspicious calcifications in the anterior lower inner quadrant of the left breast. - MRI on 06/05/2020 shows left breast mass measuring  3.8 x 3.3 x 3.2 cm in the upper inner quadrant.  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  No suspicious lymphadenopathy.  No MRI evidence of malignancy on the right. - Left breast upper inner quadrant biopsy on 05/14/2020 showed DCIS with calcifications.  Right breast biopsy shows fibroadenomatoid nodule with calcifications.  ER/PR was negative. - Left breast core needle biopsy at 10:30 position shows invasive ductal carcinoma.  ER/PR/HER-2 was negative.  Ki-67 85%.  Left lymph node biopsy was reactive. - Patient was evaluated by Dr. Gudena and Dr. Belinda. - She was recommended chemotherapy with Adriamycin  and cyclophosphamide  with pembrolizumab  (keynote-522 trial) added for improved PCR by about 15%. - Neoadjuvant Adriamycin , cyclophosphamide  and pembrolizumab  06/16/20 - 08/18/20 - Neoadjuvant carboplatin  and taxol  09/08/20 - 12/08/20 - Left lumpectomy and SLNB on 01/12/2021, YPT0, ypN0. - Adjuvant Keytruda  9 cycles from 02/15/2021 through 09/01/2021 - *** Completed XRT to left breast and chest wall in [place], [place] *** - Most recent diagnostic mammogram on 12/11/2023: BI-RADS Category 2.  She does not report any new onset pains. - Most recent labs (07/01/2024): Normal CBC and CMP.  CA 27.29 is normal (33.3).  CA 15-3 mildly elevated (27.1), but within patient's baseline range over the past few years. - Physical exam (***): Lumpectomy scar in the medial margin of the areola of the left breast is stable.  No palpable masses in both breast.  No palpable adenopathy. - No red flag symptoms and history/ROS today.  *** No new onset pains or lumps. - PLAN: Annual mammogram (diagnostic) due February 2026 - RTC in 6 months with repeat labs and physical exam. - Will switch to annual visits at the 5-year mark (around March 2027), with possible discharge to PCP at the 10-year mark (March 2032)  2.  High risk drug monitoring: - Developed hypothyroidism during chemoimmunotherapy with  Adriamycin , cyclophosphamide , Keytruda  - She is currently taking levothyroxine  100 mcg daily. - Most recent TSH (07/01/2024) normal at 1.489 - PLAN: Continue Synthroid  100 mcg daily.  Recheck TSH at follow-up in 6 months.  ***  3.  Family history: - Paternal cousin and second cousin had breast cancer.  Paternal grandmother also had some type of cancer. - Another paternal cousin died of pancreatic cancer.  Another paternal cousin had leukemia. - Germline mutation testing was negative for BRCA1/2.  PLAN SUMMARY: >> Bilateral diagnostic mammogram due February 2026 >> Labs in 6 months = CBC/D, CMP, TSH, CA 27.29, CA 15-3 >> OFFICE visit in 6 months (same day as labs okay, if patient prefers) ***   REVIEW OF SYSTEMS: ***  Review of Systems - Oncology  PHYSICAL EXAM:   Performance status (ECOG): {CHL ONC ED:[id number]} *** There were no vitals filed for this visit. Wt Readings from Last 3 Encounters:  12/31/23 162 lb 4.1 oz (73.6 kg)  07/02/23 164 lb 9.6 oz (74.7 kg)  02/08/23 162 lb 1.6 oz (73.5 kg)   Physical Exam   PAST MEDICAL/SURGICAL HISTORY:  Past Medical History:  Diagnosis Date   Allergy    Breast cancer (HCC) 2022   left   Cancer (HCC)    Diverticulitis    Family history of breast cancer 08/17/2020   Family history of pancreatic cancer 08/17/2020   GERD (gastroesophageal reflux disease)    Hypertension  Hypothyroidism    Personal history of chemotherapy    Personal history of radiation therapy    Skin cancer    Past Surgical History:  Procedure Laterality Date   BREAST BIOPSY Left 05/14/2020   x2   BREAST BIOPSY Bilateral 05/05/2020   BREAST LUMPECTOMY Left 01/12/2021   BREAST LUMPECTOMY WITH RADIOACTIVE SEED LOCALIZATION Left 01/12/2021   Procedure: LEFT BREAST LUMPECTOMY X 2 WITH RADIOACTIVE SEED LOCALIZATION;  Surgeon: Belinda Cough, MD;  Location: [facility];  Service: General;  Laterality: Left;   COLONOSCOPY     PORTACATH PLACEMENT  N/A 06/15/2020   Procedure: INSERTION PORT-A-CATH WITH ULTRASOUND GUIDANCE;  Surgeon: Belinda Cough, MD;  Location: [facility];  Service: General;  Laterality: Right;  LMA, LEAVE PORT ACCESSED-CHEMO START 9/1   SENTINEL NODE BIOPSY N/A 01/12/2021   Procedure: SENTINEL NODE BIOPSY;  Surgeon: Belinda Cough, MD;  Location: [facility];  Service: General;  Laterality: N/A;    SOCIAL HISTORY:  Social History   Socioeconomic History   Marital status: Married    Spouse name: Not on file   Number of children: Not on file   Years of education: Not on file   Highest education level: Not on file  Occupational History   Occupation: High school librarian  Tobacco Use   Smoking status: Never   Smokeless tobacco: Never  Substance and Sexual Activity   Alcohol use: Not Currently   Drug use: Never   Sexual activity: Not on file  Other Topics Concern   Not on file  Social History Narrative   Not on file   Social Drivers of Health   Financial Resource Strain: Low Risk  (06/23/2020)   Overall Financial Resource Strain (CARDIA)    Difficulty of Paying Living Expenses: Not hard at all  Food Insecurity: No Food Insecurity (06/23/2020)   Hunger Vital Sign    Worried About Running Out of Food in the Last Year: Never true    Ran Out of Food in the Last Year: Never true  Transportation Needs: No Transportation Needs (06/23/2020)   PRAPARE - Administrator, Civil Service (Medical): No    Lack of Transportation (Non-Medical): No  Physical Activity: Sufficiently Active (06/23/2020)   Exercise Vital Sign    Days of Exercise per Week: 7 days    Minutes of Exercise per Session: 60 min  Stress: Stress Concern Present (06/23/2020)   [employer] of Occupational Health - Occupational Stress Questionnaire    Feeling of Stress : To some extent  Social Connections: Moderately Integrated (06/23/2020)   Social Connection and Isolation Panel    Frequency of Communication  with Friends and Family: Once a week    Frequency of Social Gatherings with Friends and Family: Never    Attends Religious Services: More than 4 times per year    Active Member of [employer] or Organizations: Yes    Attends Banker Meetings: 1 to 4 times per year    Marital Status: Married  Catering manager Violence: Not At Risk (06/23/2020)   Humiliation, Afraid, Rape, and Kick questionnaire    Fear of Current or Ex-Partner: No    Emotionally Abused: No    Physically Abused: No    Sexually Abused: No    FAMILY HISTORY:  Family History  Problem Relation Age of Onset   Diverticulitis Mother    Hypertension Maternal Aunt    Hypertension Maternal Uncle    Cancer Paternal Grandmother  unknown type; d. 75s   Breast cancer Cousin 12       paternal. recurred for 2nd time.   Breast cancer Cousin 54   Pancreatic cancer Cousin        paternal; dx 46s    CURRENT MEDICATIONS:  Current Outpatient Medications  Medication Sig Dispense Refill   amLODipine-benazepril (LOTREL) 5-20 MG capsule Take 1 capsule by mouth daily.     aspirin EC 81 MG tablet Take 81 mg by mouth daily. Swallow whole.     Bismuth Subsalicylate (PEPTO-BISMOL PO) Take by mouth as needed.     cholecalciferol (VITAMIN D3) 25 MCG (1000 UNIT) tablet Take 1,000 Units by mouth daily.     CO-ENZYME Q-10 PO Take by mouth.     fexofenadine (ALLEGRA) 180 MG tablet Take 180 mg by mouth daily.     FIBER GUMMIES PO Take by mouth.     fluticasone (FLONASE) 50 MCG/ACT nasal spray Place into both nostrils daily.     Lactobacillus-Inulin (CULTURELLE DIGESTIVE DAILY) CAPS Take 1 capsule by mouth daily.     levothyroxine  (SYNTHROID ) 100 MCG tablet TAKE 1 TABLET BY MOUTH ONCE DAILY BEFORE BREAKFAST 30 tablet 3   Multiple Vitamin (MULTIVITAMIN ADULT PO) Take by mouth.     omeprazole (PRILOSEC) 20 MG capsule Take 20 mg by mouth daily.     No current facility-administered medications for this visit.    ALLERGIES:  Allergies   Allergen Reactions   Antihistamines, Chlorpheniramine-Type Rash   Grapefruit Extract Other (See Comments)    Cannot take with blood pressure medications   Pseudoephedrine Hcl Other (See Comments)    Other Reaction: Other reaction   Nickel    Pineapple    Amoxicillin Rash and Hives    LABORATORY DATA:  I have reviewed the labs as listed.     Latest Ref Rng & Units 07/01/2024    3:53 PM 12/11/2023    3:18 PM 06/21/2023    2:56 PM  CBC  WBC 4.0 - 10.5 K/uL 6.5  7.2  6.2   Hemoglobin 12.0 - 15.0 g/dL 85.2  86.2  86.0   Hematocrit 36.0 - 46.0 % 43.2  40.5  42.1   Platelets 150 - 400 K/uL 247  260  242       Latest Ref Rng & Units 07/01/2024    3:53 PM 12/11/2023    3:18 PM 06/21/2023    2:56 PM  CMP  Glucose 70 - 99 mg/dL 86  97  97   BUN 8 - 23 mg/dL 14  8  14    Creatinine 0.44 - 1.00 mg/dL 9.25  9.22  9.29   Sodium 135 - 145 mmol/L 138  137  137   Potassium 3.5 - 5.1 mmol/L 3.7  4.0  3.6   Chloride 98 - 111 mmol/L 102  103  101   CO2 22 - 32 mmol/L 24  23  25    Calcium 8.9 - 10.3 mg/dL 9.3  9.5  8.9   Total Protein 6.5 - 8.1 g/dL 6.8  7.0  6.6   Total Bilirubin 0.0 - 1.2 mg/dL 0.9  0.6  0.5   Alkaline Phos 38 - 126 U/L 65  71  95   AST 15 - 41 U/L 26  25  26    ALT 0 - 44 U/L 21  23  30      DIAGNOSTIC IMAGING:  I have independently reviewed the scans and discussed with the patient. No results found.   WRAP  UP:  All questions were answered. The patient knows to call the clinic with any problems, questions or concerns.  Medical decision making: ***  Time spent on visit: I spent {CHL ONC TIME VISIT - DTPQU:[id number]} counseling the patient face to face. The total time spent in the appointment was {CHL ONC TIME VISIT - DTPQU:[id number]} and more than 50% was on counseling.  Pleasant CHRISTELLA Barefoot, PA-C  ***

## 2024-07-07 ENCOUNTER — Other Ambulatory Visit (HOSPITAL_COMMUNITY): Payer: Self-pay | Admitting: Physician Assistant

## 2024-07-07 ENCOUNTER — Inpatient Hospital Stay (HOSPITAL_BASED_OUTPATIENT_CLINIC_OR_DEPARTMENT_OTHER): Admitting: Physician Assistant

## 2024-07-07 VITALS — BP 138/82 | HR 64 | Temp 97.8°F | Resp 16 | Wt 158.7 lb

## 2024-07-07 DIAGNOSIS — Z171 Estrogen receptor negative status [ER-]: Secondary | ICD-10-CM

## 2024-07-07 DIAGNOSIS — E032 Hypothyroidism due to medicaments and other exogenous substances: Secondary | ICD-10-CM | POA: Diagnosis not present

## 2024-07-07 DIAGNOSIS — C50812 Malignant neoplasm of overlapping sites of left female breast: Secondary | ICD-10-CM | POA: Diagnosis not present

## 2024-07-07 DIAGNOSIS — Z853 Personal history of malignant neoplasm of breast: Secondary | ICD-10-CM | POA: Diagnosis not present

## 2024-07-07 NOTE — Patient Instructions (Signed)
 [facility] **VISIT SUMMARY & IMPORTANT INSTRUCTIONS **   You were seen today by Pleasant Barefoot PA-C for your history of breast cancer.   Your most recent labs, mammogram, and physical exam did not show any evidence of returning cancer at this time. You are due for bilateral diagnostic mammogram in February 2026. Will repeat labs and see you for follow-up visit in 6 months.  FOLLOW-UP APPOINTMENT: 6 months  ** Thank you for trusting me with your healthcare!  I strive to provide all of my patients with quality care at each visit.  If you receive a survey for this visit, I would be so grateful to you for taking the time to provide feedback.  Thank you in advance!  ~ Deldrick Linch                                        Dr. Mickiel Davonna Pleasant Barefoot, PA-C          Delon Hope, NP   - - - - - - - - - - - - - - - - - -    Thank you for choosing [facility] at [facility] to provide your oncology and hematology care.  To afford each patient quality time with our provider, please arrive at least 15 minutes before your scheduled appointment time.   If you have a lab appointment with the Cancer Center please come in thru the Main Entrance and check in at the main information desk.  You need to re-schedule your appointment should you arrive 10 or more minutes late.  We strive to give you quality time with our providers, and arriving late affects you and other patients whose appointments are after yours.  Also, if you no show three or more times for appointments you may be dismissed from the clinic at the providers discretion.     Again, thank you for choosing [facility].  Our hope is that these requests will decrease the amount of time that you wait before being seen by our physicians.       _____________________________________________________________  Should you have questions after your visit to [facility], please contact our office at [phone] and follow the prompts.  Our office hours are 8:00 a.m. and 4:30 p.m. Monday - Friday.  Please note that voicemails left after 4:00 p.m. may not be returned until the following business day.  We are closed weekends and major holidays.  You do have access to a nurse 24-7, just call the main number to the clinic [phone] and do not press any options, hold on the line and a nurse will answer the phone.    For prescription refill requests, have your pharmacy contact our office and allow 72 hours.

## 2024-12-16 ENCOUNTER — Other Ambulatory Visit (HOSPITAL_COMMUNITY)

## 2024-12-16 ENCOUNTER — Inpatient Hospital Stay (HOSPITAL_COMMUNITY): Admission: RE | Admit: 2024-12-16 | Source: Ambulatory Visit

## 2025-01-05 ENCOUNTER — Other Ambulatory Visit

## 2025-01-12 ENCOUNTER — Ambulatory Visit: Admitting: Physician Assistant
